# Patient Record
Sex: Female | Born: 1955
Health system: Southern US, Community
[De-identification: ages and names within clinical notes are randomized; demographics above are authoritative.]

## PROBLEM LIST (undated history)

## (undated) DIAGNOSIS — M329 Systemic lupus erythematosus, unspecified: Secondary | ICD-10-CM

## (undated) DIAGNOSIS — D649 Anemia, unspecified: Secondary | ICD-10-CM

## (undated) DIAGNOSIS — R7989 Other specified abnormal findings of blood chemistry: Secondary | ICD-10-CM

## (undated) DIAGNOSIS — I10 Essential (primary) hypertension: Secondary | ICD-10-CM

## (undated) DIAGNOSIS — R87619 Unspecified abnormal cytological findings in specimens from cervix uteri: Secondary | ICD-10-CM

## (undated) DIAGNOSIS — IMO0002 Reserved for concepts with insufficient information to code with codable children: Secondary | ICD-10-CM

## (undated) DIAGNOSIS — F419 Anxiety disorder, unspecified: Secondary | ICD-10-CM

## (undated) DIAGNOSIS — N289 Disorder of kidney and ureter, unspecified: Secondary | ICD-10-CM

## (undated) HISTORY — DX: Essential (primary) hypertension: I10

## (undated) HISTORY — DX: Unspecified abnormal cytological findings in specimens from cervix uteri: R87.619

## (undated) HISTORY — PX: CRYOTHERAPY: SHX1416

## (undated) HISTORY — DX: Systemic lupus erythematosus, unspecified: M32.9

## (undated) HISTORY — DX: Other specified abnormal findings of blood chemistry: R79.89

## (undated) HISTORY — DX: Anemia, unspecified: D64.9

## (undated) HISTORY — DX: Disorder of kidney and ureter, unspecified: N28.9

## (undated) HISTORY — DX: Reserved for concepts with insufficient information to code with codable children: IMO0002

---

## 1985-10-22 DIAGNOSIS — IMO0002 Reserved for concepts with insufficient information to code with codable children: Secondary | ICD-10-CM

## 1985-10-22 HISTORY — DX: Reserved for concepts with insufficient information to code with codable children: IMO0002

## 1986-10-22 DIAGNOSIS — N289 Disorder of kidney and ureter, unspecified: Secondary | ICD-10-CM

## 1986-10-22 HISTORY — DX: Disorder of kidney and ureter, unspecified: N28.9

## 1988-10-22 HISTORY — PX: TUBAL LIGATION: SHX77

## 1998-04-13 ENCOUNTER — Ambulatory Visit (HOSPITAL_COMMUNITY): Admission: RE | Admit: 1998-04-13 | Discharge: 1998-04-13 | Payer: Self-pay | Admitting: Obstetrics and Gynecology

## 1998-08-01 ENCOUNTER — Other Ambulatory Visit: Admission: RE | Admit: 1998-08-01 | Discharge: 1998-08-01 | Payer: Self-pay | Admitting: Obstetrics and Gynecology

## 1998-12-15 ENCOUNTER — Other Ambulatory Visit: Admission: RE | Admit: 1998-12-15 | Discharge: 1998-12-15 | Payer: Self-pay | Admitting: Obstetrics and Gynecology

## 1999-04-18 ENCOUNTER — Encounter: Payer: Self-pay | Admitting: Obstetrics and Gynecology

## 1999-04-18 ENCOUNTER — Ambulatory Visit (HOSPITAL_COMMUNITY): Admission: RE | Admit: 1999-04-18 | Discharge: 1999-04-18 | Payer: Self-pay | Admitting: Obstetrics and Gynecology

## 1999-04-26 ENCOUNTER — Ambulatory Visit (HOSPITAL_COMMUNITY): Admission: RE | Admit: 1999-04-26 | Discharge: 1999-04-26 | Payer: Self-pay | Admitting: Obstetrics and Gynecology

## 1999-04-26 ENCOUNTER — Encounter: Payer: Self-pay | Admitting: Obstetrics and Gynecology

## 1999-06-08 ENCOUNTER — Other Ambulatory Visit: Admission: RE | Admit: 1999-06-08 | Discharge: 1999-06-08 | Payer: Self-pay | Admitting: Obstetrics and Gynecology

## 1999-07-04 ENCOUNTER — Encounter (INDEPENDENT_AMBULATORY_CARE_PROVIDER_SITE_OTHER): Payer: Self-pay | Admitting: Specialist

## 1999-07-04 ENCOUNTER — Ambulatory Visit (HOSPITAL_COMMUNITY): Admission: RE | Admit: 1999-07-04 | Discharge: 1999-07-04 | Payer: Self-pay | Admitting: *Deleted

## 1999-07-23 HISTORY — PX: BREAST EXCISIONAL BIOPSY: SUR124

## 1999-07-23 HISTORY — PX: BREAST SURGERY: SHX581

## 1999-08-15 ENCOUNTER — Ambulatory Visit (HOSPITAL_COMMUNITY): Admission: RE | Admit: 1999-08-15 | Discharge: 1999-08-15 | Payer: Self-pay | Admitting: *Deleted

## 1999-08-15 ENCOUNTER — Encounter (INDEPENDENT_AMBULATORY_CARE_PROVIDER_SITE_OTHER): Payer: Self-pay | Admitting: Specialist

## 1999-11-22 ENCOUNTER — Ambulatory Visit (HOSPITAL_COMMUNITY): Admission: RE | Admit: 1999-11-22 | Discharge: 1999-11-22 | Payer: Self-pay | Admitting: Obstetrics and Gynecology

## 1999-11-22 ENCOUNTER — Encounter: Payer: Self-pay | Admitting: Obstetrics and Gynecology

## 1999-12-01 ENCOUNTER — Other Ambulatory Visit: Admission: RE | Admit: 1999-12-01 | Discharge: 1999-12-01 | Payer: Self-pay | Admitting: Obstetrics and Gynecology

## 2000-04-16 ENCOUNTER — Ambulatory Visit (HOSPITAL_COMMUNITY): Admission: RE | Admit: 2000-04-16 | Discharge: 2000-04-16 | Payer: Self-pay | Admitting: Obstetrics and Gynecology

## 2000-04-16 ENCOUNTER — Encounter: Payer: Self-pay | Admitting: Obstetrics and Gynecology

## 2000-11-22 ENCOUNTER — Other Ambulatory Visit: Admission: RE | Admit: 2000-11-22 | Discharge: 2000-11-22 | Payer: Self-pay | Admitting: Obstetrics and Gynecology

## 2001-04-29 ENCOUNTER — Ambulatory Visit (HOSPITAL_COMMUNITY): Admission: RE | Admit: 2001-04-29 | Discharge: 2001-04-29 | Payer: Self-pay | Admitting: Obstetrics and Gynecology

## 2001-04-29 ENCOUNTER — Encounter: Payer: Self-pay | Admitting: Obstetrics and Gynecology

## 2002-04-30 ENCOUNTER — Encounter: Payer: Self-pay | Admitting: Obstetrics and Gynecology

## 2002-04-30 ENCOUNTER — Ambulatory Visit (HOSPITAL_COMMUNITY): Admission: RE | Admit: 2002-04-30 | Discharge: 2002-04-30 | Payer: Self-pay | Admitting: Obstetrics and Gynecology

## 2003-05-04 ENCOUNTER — Encounter: Payer: Self-pay | Admitting: Obstetrics and Gynecology

## 2003-05-04 ENCOUNTER — Ambulatory Visit (HOSPITAL_COMMUNITY): Admission: RE | Admit: 2003-05-04 | Discharge: 2003-05-04 | Payer: Self-pay | Admitting: Obstetrics and Gynecology

## 2004-05-04 ENCOUNTER — Ambulatory Visit (HOSPITAL_COMMUNITY): Admission: RE | Admit: 2004-05-04 | Discharge: 2004-05-04 | Payer: Self-pay | Admitting: Obstetrics and Gynecology

## 2005-01-04 ENCOUNTER — Other Ambulatory Visit: Admission: RE | Admit: 2005-01-04 | Discharge: 2005-01-04 | Payer: Self-pay | Admitting: Obstetrics and Gynecology

## 2005-05-08 ENCOUNTER — Ambulatory Visit (HOSPITAL_COMMUNITY): Admission: RE | Admit: 2005-05-08 | Discharge: 2005-05-08 | Payer: Self-pay | Admitting: Obstetrics and Gynecology

## 2005-07-30 ENCOUNTER — Ambulatory Visit: Payer: Self-pay | Admitting: General Surgery

## 2005-10-22 HISTORY — PX: NASAL SEPTUM SURGERY: SHX37

## 2006-01-10 ENCOUNTER — Other Ambulatory Visit: Admission: RE | Admit: 2006-01-10 | Discharge: 2006-01-10 | Payer: Self-pay | Admitting: Obstetrics & Gynecology

## 2006-05-09 ENCOUNTER — Ambulatory Visit (HOSPITAL_COMMUNITY): Admission: RE | Admit: 2006-05-09 | Discharge: 2006-05-09 | Payer: Self-pay | Admitting: Obstetrics and Gynecology

## 2006-10-14 ENCOUNTER — Other Ambulatory Visit: Payer: Self-pay

## 2006-10-21 ENCOUNTER — Ambulatory Visit: Payer: Self-pay | Admitting: Unknown Physician Specialty

## 2006-12-12 ENCOUNTER — Emergency Department: Payer: Self-pay

## 2007-01-13 ENCOUNTER — Other Ambulatory Visit: Admission: RE | Admit: 2007-01-13 | Discharge: 2007-01-13 | Payer: Self-pay | Admitting: Obstetrics & Gynecology

## 2007-02-18 DIAGNOSIS — R809 Proteinuria, unspecified: Secondary | ICD-10-CM | POA: Insufficient documentation

## 2007-05-13 ENCOUNTER — Ambulatory Visit (HOSPITAL_COMMUNITY): Admission: RE | Admit: 2007-05-13 | Discharge: 2007-05-13 | Payer: Self-pay | Admitting: Obstetrics & Gynecology

## 2007-10-04 ENCOUNTER — Ambulatory Visit: Payer: Self-pay | Admitting: Rheumatology

## 2007-10-18 ENCOUNTER — Emergency Department: Payer: Self-pay | Admitting: Emergency Medicine

## 2007-10-20 ENCOUNTER — Emergency Department: Payer: Self-pay | Admitting: Emergency Medicine

## 2007-10-27 ENCOUNTER — Emergency Department: Payer: Self-pay | Admitting: Emergency Medicine

## 2007-10-29 ENCOUNTER — Emergency Department: Payer: Self-pay | Admitting: Emergency Medicine

## 2008-01-27 ENCOUNTER — Other Ambulatory Visit: Admission: RE | Admit: 2008-01-27 | Discharge: 2008-01-27 | Payer: Self-pay | Admitting: Obstetrics and Gynecology

## 2008-05-13 ENCOUNTER — Ambulatory Visit (HOSPITAL_COMMUNITY): Admission: RE | Admit: 2008-05-13 | Discharge: 2008-05-13 | Payer: Self-pay | Admitting: Obstetrics & Gynecology

## 2008-05-21 ENCOUNTER — Encounter: Admission: RE | Admit: 2008-05-21 | Discharge: 2008-05-21 | Payer: Self-pay | Admitting: Obstetrics & Gynecology

## 2009-02-24 ENCOUNTER — Other Ambulatory Visit: Admission: RE | Admit: 2009-02-24 | Discharge: 2009-02-24 | Payer: Self-pay | Admitting: Obstetrics and Gynecology

## 2009-05-25 ENCOUNTER — Ambulatory Visit (HOSPITAL_COMMUNITY): Admission: RE | Admit: 2009-05-25 | Discharge: 2009-05-25 | Payer: Self-pay | Admitting: Internal Medicine

## 2010-05-26 ENCOUNTER — Ambulatory Visit (HOSPITAL_COMMUNITY): Admission: RE | Admit: 2010-05-26 | Discharge: 2010-05-26 | Payer: Self-pay | Admitting: Obstetrics and Gynecology

## 2010-10-22 HISTORY — PX: COLONOSCOPY: SHX174

## 2010-11-10 ENCOUNTER — Ambulatory Visit: Payer: Self-pay | Admitting: General Surgery

## 2011-06-11 ENCOUNTER — Other Ambulatory Visit: Payer: Self-pay | Admitting: Obstetrics and Gynecology

## 2011-06-11 DIAGNOSIS — Z1231 Encounter for screening mammogram for malignant neoplasm of breast: Secondary | ICD-10-CM

## 2011-06-15 ENCOUNTER — Ambulatory Visit (HOSPITAL_COMMUNITY)
Admission: RE | Admit: 2011-06-15 | Discharge: 2011-06-15 | Disposition: A | Discharge status: Home / Self Care | Payer: PRIVATE HEALTH INSURANCE | Source: Ambulatory Visit | Attending: Obstetrics and Gynecology | Admitting: Obstetrics and Gynecology

## 2011-06-15 DIAGNOSIS — Z1231 Encounter for screening mammogram for malignant neoplasm of breast: Secondary | ICD-10-CM | POA: Insufficient documentation

## 2011-08-29 ENCOUNTER — Ambulatory Visit: Payer: Self-pay | Admitting: Internal Medicine

## 2011-09-22 ENCOUNTER — Ambulatory Visit: Payer: Self-pay | Admitting: Internal Medicine

## 2012-05-19 ENCOUNTER — Other Ambulatory Visit: Payer: Self-pay | Admitting: Nurse Practitioner

## 2012-05-19 DIAGNOSIS — Z1231 Encounter for screening mammogram for malignant neoplasm of breast: Secondary | ICD-10-CM

## 2012-06-16 ENCOUNTER — Ambulatory Visit (HOSPITAL_COMMUNITY)
Admission: RE | Admit: 2012-06-16 | Discharge: 2012-06-16 | Disposition: A | Discharge status: Home / Self Care | Payer: No Typology Code available for payment source | Source: Ambulatory Visit | Attending: Nurse Practitioner | Admitting: Nurse Practitioner

## 2012-06-16 DIAGNOSIS — Z1231 Encounter for screening mammogram for malignant neoplasm of breast: Secondary | ICD-10-CM | POA: Insufficient documentation

## 2012-06-19 ENCOUNTER — Other Ambulatory Visit: Payer: Self-pay | Admitting: Nurse Practitioner

## 2012-06-19 DIAGNOSIS — R928 Other abnormal and inconclusive findings on diagnostic imaging of breast: Secondary | ICD-10-CM

## 2012-06-24 ENCOUNTER — Ambulatory Visit
Admission: RE | Admit: 2012-06-24 | Discharge: 2012-06-24 | Disposition: A | Discharge status: Home / Self Care | Payer: PRIVATE HEALTH INSURANCE | Source: Ambulatory Visit | Attending: Nurse Practitioner | Admitting: Nurse Practitioner

## 2012-06-24 DIAGNOSIS — R928 Other abnormal and inconclusive findings on diagnostic imaging of breast: Secondary | ICD-10-CM

## 2012-09-10 ENCOUNTER — Ambulatory Visit: Payer: Self-pay | Admitting: Internal Medicine

## 2012-09-21 ENCOUNTER — Ambulatory Visit: Payer: Self-pay | Admitting: Internal Medicine

## 2013-02-03 DIAGNOSIS — I10 Essential (primary) hypertension: Secondary | ICD-10-CM | POA: Insufficient documentation

## 2013-02-04 DIAGNOSIS — N1831 Chronic kidney disease, stage 3a: Secondary | ICD-10-CM | POA: Insufficient documentation

## 2013-02-04 DIAGNOSIS — N183 Chronic kidney disease, stage 3 unspecified: Secondary | ICD-10-CM | POA: Insufficient documentation

## 2013-03-11 ENCOUNTER — Encounter: Payer: Self-pay | Admitting: *Deleted

## 2013-03-12 ENCOUNTER — Encounter: Payer: Self-pay | Admitting: Nurse Practitioner

## 2013-03-12 ENCOUNTER — Ambulatory Visit (INDEPENDENT_AMBULATORY_CARE_PROVIDER_SITE_OTHER): Payer: PRIVATE HEALTH INSURANCE | Admitting: Nurse Practitioner

## 2013-03-12 VITALS — BP 120/66 | HR 72 | Ht 66.0 in | Wt 192.4 lb

## 2013-03-12 DIAGNOSIS — N951 Menopausal and female climacteric states: Secondary | ICD-10-CM

## 2013-03-12 DIAGNOSIS — Z01419 Encounter for gynecological examination (general) (routine) without abnormal findings: Secondary | ICD-10-CM

## 2013-03-12 DIAGNOSIS — Z0000 Encounter for general adult medical examination without abnormal findings: Secondary | ICD-10-CM

## 2013-03-12 LAB — POCT URINALYSIS DIPSTICK
Spec Grav, UA: 1.02
Urobilinogen, UA: NEGATIVE

## 2013-03-12 MED ORDER — CONJ ESTROG-MEDROXYPROGEST ACE 0.3-1.5 MG PO TABS: 1.0000 | ORAL_TABLET | Freq: Every day | ORAL | Status: DC

## 2013-03-12 NOTE — Progress Notes (Signed)
57 y.o.G1, P1 . Married Caucasian Fe here for annual exam.  Ran out of Prempro and not refilled for this past month. Some increased in vaso symptoms. Going to [facility] to see grandson next week. Very excited.  No LMP recorded. Patient is postmenopausal.          Sexually active: yes  The current method of family planning is tubal ligation.    Exercising: yes  walk Smoker:  no  Health Maintenance: Pap:  03/07/2012  Normal  MMG:  04/2012 with diagnostic imaging and Ultrasound which was negative Colonoscopy:  10/2010 diverticulitis recheck in 5 years BMD:   03/2010 normal TDaP:  Within ten years, per pt.  Labs: hgb- 11.1   reports that she has never smoked. She has never used smokeless tobacco. She reports that she drinks about 1.2 ounces of alcohol per week. She reports that she does not use illicit drugs.  Past Medical History  Diagnosis Date  . Anemia   . History of lupus 1987  . Hypertension   . Renal insufficiency 1988    kidney biopsy 1988 and 1998  . Abnormal pap     Past Surgical History  Procedure Laterality Date  . Breast surgery  10/00    right breast benign fibroadenoma    Current Outpatient Prescriptions  Medication Sig Dispense Refill  . aspirin 81 MG tablet Take 81 mg by mouth daily.      . citalopram (CELEXA) 20 MG tablet Take 20 mg by mouth daily.      . diazepam (VALIUM) 5 MG tablet Take 5 mg by mouth every 6 (six) hours as needed for anxiety.      . enalapril (VASOTEC) 10 MG tablet Take 10 mg by mouth daily.      Marland Kitchen estrogen, conjugated,-medroxyprogesterone (PREMPRO) 0.3-1.5 MG per tablet Take 1 tablet by mouth daily.  90 tablet  3  . hydrochlorothiazide (HYDRODIURIL) 25 MG tablet Take 25 mg by mouth daily.      . hydroxychloroquine (PLAQUENIL) 200 MG tablet Take by mouth daily.      . traMADol (ULTRAM) 50 MG tablet Take 50 mg by mouth every 6 (six) hours as needed for pain.      . ergocalciferol (VITAMIN D2) 50000 UNITS capsule Take 50,000 Units by mouth once a  week.      . Multiple Vitamin (MULTIVITAMIN) capsule Take 1 capsule by mouth daily.       No current facility-administered medications for this visit.    Family History  Problem Relation Age of Onset  . Cancer Father     oral cancer  . Diabetes Paternal Aunt   . Cancer Maternal Grandmother     ovarian cancer  . Hypertension Maternal Grandmother   . Diabetes Maternal Grandfather     ROS:  Pertinent items are noted in HPI.  Otherwise, a comprehensive ROS was negative.  Exam:   BP 120/66  Pulse 72  Ht 5\' 6"  (1.676 m)  Wt 192 lb 6.4 oz (87.272 kg)  BMI 31.07 kg/m2 Height: 5\' 6"  (167.6 cm)  Ht Readings from Last 3 Encounters:  03/12/13 5\' 6"  (1.676 m)    General appearance: alert, cooperative and appears stated age Head: Normocephalic, without obvious abnormality, atraumatic Neck: no adenopathy, supple, symmetrical, trachea midline and thyroid normal to inspection and palpation Lungs: clear to auscultation bilaterally Breasts: normal appearance, no masses or tenderness, no mass felt in right breast.  Always has FCB changes. Heart: regular rate and rhythm Abdomen: soft, non-tender;  no masses,  no organomegaly Extremities: extremities normal, atraumatic, no cyanosis or edema Skin: Skin color, texture, turgor normal. No rashes or lesions Lymph nodes: Cervical, supraclavicular, and axillary nodes normal. No abnormal inguinal nodes palpated Neurologic: Grossly normal   Pelvic: External genitalia:  no lesions              Urethra:  normal appearing urethra with no masses, tenderness or lesions              Bartholin's and Skene's: normal                 Vagina: normal appearing vagina with normal color and discharge, no lesions              Cervix: anteverted              Pap taken: no Bimanual Exam:  Uterus:  normal size, contour, position, consistency, mobility, non-tender              Adnexa: no mass, fullness, tenderness               Rectovaginal: Confirms                Anus:  normal sphincter tone, no lesions  A:  Well Woman with normal exam  Postmenopausal on HRT and wants to continue but OK to reduce dosage  History of FCB changes  Recent abnormal mammo for diagnostic on 5/29  (usually gets a recall yearly)  P:   Pap smear as per guidelines   Mammogram already scheduled for repeat test on 03/19/13  RX Prempro 0.3/1.5 daily, but patietn is aware if any abnormal findings to stop meds,   She will wait and restart after 5/29 outcome.  Discussed rationale for continued use or discontinuation of HRT with potential side effects and risk  including DVT, CVA, cancer , etc. return annually or prn  An After Visit Summary was printed and given to the patient.

## 2013-03-12 NOTE — Patient Instructions (Signed)

## 2013-03-13 NOTE — Progress Notes (Signed)
Encounter reviewed by Dr. Ad Guttman Silva.  

## 2013-04-13 ENCOUNTER — Telehealth: Payer: Self-pay | Admitting: Nurse Practitioner

## 2013-04-13 NOTE — Telephone Encounter (Signed)
Patient wants lab results for Vit.D

## 2013-04-13 NOTE — Telephone Encounter (Signed)
LMTCB  aa    (Need to tell her that Vit D level was 28.1. Pt can take OTC Vit D supplement every day.)

## 2013-04-17 NOTE — Telephone Encounter (Signed)
Spoke with pt about Vit D level. Pt is taking a MVI and a calcium and Vit D supplement daily. Advised that would be enough to bring level up. Pt agreeable.

## 2013-05-12 ENCOUNTER — Other Ambulatory Visit: Payer: Self-pay | Admitting: Nurse Practitioner

## 2013-05-12 ENCOUNTER — Other Ambulatory Visit: Payer: Self-pay

## 2013-05-12 DIAGNOSIS — Z1231 Encounter for screening mammogram for malignant neoplasm of breast: Secondary | ICD-10-CM

## 2013-06-17 ENCOUNTER — Ambulatory Visit (HOSPITAL_COMMUNITY)
Admission: RE | Admit: 2013-06-17 | Discharge: 2013-06-17 | Disposition: A | Discharge status: Home / Self Care | Payer: No Typology Code available for payment source | Source: Ambulatory Visit | Attending: Nurse Practitioner | Admitting: Nurse Practitioner

## 2013-06-17 DIAGNOSIS — Z1231 Encounter for screening mammogram for malignant neoplasm of breast: Secondary | ICD-10-CM

## 2013-09-09 ENCOUNTER — Ambulatory Visit: Payer: Self-pay | Admitting: Internal Medicine

## 2013-09-21 ENCOUNTER — Ambulatory Visit: Payer: Self-pay | Admitting: Internal Medicine

## 2013-10-22 ENCOUNTER — Ambulatory Visit: Payer: Self-pay | Admitting: Internal Medicine

## 2013-11-25 DIAGNOSIS — D649 Anemia, unspecified: Secondary | ICD-10-CM | POA: Insufficient documentation

## 2014-02-17 ENCOUNTER — Other Ambulatory Visit: Payer: Self-pay

## 2014-02-17 DIAGNOSIS — N951 Menopausal and female climacteric states: Secondary | ICD-10-CM

## 2014-02-17 MED ORDER — CONJ ESTROG-MEDROXYPROGEST ACE 0.3-1.5 MG PO TABS: 1.0000 | ORAL_TABLET | Freq: Every day | ORAL | Status: DC

## 2014-02-17 NOTE — Telephone Encounter (Signed)
Pt has aex 03-16-14. Pt had normal mammo 7/14. Pharmacy requesting 90 day supply. rx sent for 90 days with 0 refills

## 2014-03-16 ENCOUNTER — Encounter: Payer: Self-pay | Admitting: Nurse Practitioner

## 2014-03-16 ENCOUNTER — Ambulatory Visit (INDEPENDENT_AMBULATORY_CARE_PROVIDER_SITE_OTHER): Payer: PRIVATE HEALTH INSURANCE | Admitting: Nurse Practitioner

## 2014-03-16 VITALS — BP 132/64 | HR 64 | Ht 65.5 in | Wt 162.0 lb

## 2014-03-16 DIAGNOSIS — Z01419 Encounter for gynecological examination (general) (routine) without abnormal findings: Secondary | ICD-10-CM

## 2014-03-16 DIAGNOSIS — R82998 Other abnormal findings in urine: Secondary | ICD-10-CM

## 2014-03-16 DIAGNOSIS — Z0000 Encounter for general adult medical examination without abnormal findings: Secondary | ICD-10-CM

## 2014-03-16 DIAGNOSIS — R8290 Unspecified abnormal findings in urine: Secondary | ICD-10-CM

## 2014-03-16 DIAGNOSIS — N951 Menopausal and female climacteric states: Secondary | ICD-10-CM

## 2014-03-16 LAB — POCT URINALYSIS DIPSTICK
BILIRUBIN UA: NEGATIVE
Glucose, UA: NEGATIVE
KETONES UA: NEGATIVE
NITRITE UA: NEGATIVE
PH UA: 5
Protein, UA: NEGATIVE
RBC UA: NEGATIVE
Urobilinogen, UA: NEGATIVE

## 2014-03-16 LAB — HEMOGLOBIN, FINGERSTICK: Hemoglobin, fingerstick: 11.5 g/dL — ABNORMAL LOW (ref 12.0–16.0)

## 2014-03-16 MED ORDER — CONJ ESTROG-MEDROXYPROGEST ACE 0.3-1.5 MG PO TABS
1.0000 | ORAL_TABLET | Freq: Every day | ORAL | Status: DC
Start: 2014-03-16 — End: 2015-03-22

## 2014-03-16 NOTE — Patient Instructions (Signed)

## 2014-03-16 NOTE — Progress Notes (Signed)
Patient ID: [name], female   DOB: Mar 26, 1956, 58 y.o.   MRN: [id number] 58 y.o. G1P1 Married Caucasian Fe here for annual exam. She is doing well on HRT and wants to continue.  In September had left leg pain after stepping off a step stool earlier in the year.  Will call the rhuematoligist as she is still having pain in that area - may need MRI.   Husband now diagnosed with lung cancer stage III b size 4 cm. Now on chemo and radiation. No UTI symptoms.  Patient's last menstrual period was 08/22/2008.          Sexually active: yes  The current method of family planning is tubal ligation.    Exercising: yes  walking Smoker:  no  Health Maintenance: Pap: 03/07/2012 Normal (history of CIN I 1978) MMG: 06/17/13, Bi-Rads 1: negative   Colonoscopy: 10/2010 diverticulitis recheck in 5 years  BMD: 03/2010 normal  TDaP: Within ten years, per pt.  Labs:  HB:  11.5  Urine:  Trace leuks   reports that she has never smoked. She has never used smokeless tobacco. She reports that she drinks about 1.2 ounces of alcohol per week. She reports that she does not use illicit drugs.  Past Medical History  Diagnosis Date  . Anemia   . History of lupus 1987  . Hypertension   . Renal insufficiency 1988    kidney biopsy 1988 and 1998  . Abnormal pap     Past Surgical History  Procedure Laterality Date  . Breast surgery  10/00    right breast benign fibroadenoma    Current Outpatient Prescriptions  Medication Sig Dispense Refill  . aspirin 81 MG tablet Take 81 mg by mouth daily.      . Calcium Carb-Cholecalciferol (CALCIUM 600 + D PO) Take 1 tablet by mouth at bedtime.      . diazepam (VALIUM) 5 MG tablet Take 5 mg by mouth every 6 (six) hours as needed for anxiety.      . enalapril (VASOTEC) 10 MG tablet Take 10 mg by mouth daily.      Marland Kitchen estrogen, conjugated,-medroxyprogesterone (PREMPRO) 0.3-1.5 MG per tablet Take 1 tablet by mouth daily.  90 tablet  3  . hydroxychloroquine (PLAQUENIL) 200 MG  tablet Take by mouth daily.      . Multiple Vitamin (MULTIVITAMIN) capsule Take 1 capsule by mouth daily.      . traMADol (ULTRAM) 50 MG tablet Take 50 mg by mouth every 6 (six) hours as needed for pain.       No current facility-administered medications for this visit.    Family History  Problem Relation Age of Onset  . Cancer Father     oral cancer  . Diabetes Paternal Aunt   . Cancer Maternal Grandmother     ovarian cancer  . Hypertension Maternal Grandmother   . Diabetes Maternal Grandfather     ROS:  Pertinent items are noted in HPI.  Otherwise, a comprehensive ROS was negative.  Exam:   BP 132/64  Pulse 64  Ht 5' 5.5" (1.664 m)  Wt 162 lb (73.483 kg)  BMI 26.54 kg/m2  LMP 08/22/2008 Height: 5' 5.5" (166.4 cm)  Ht Readings from Last 3 Encounters:  03/16/14 5' 5.5" (1.664 m)  03/12/13 5\' 6"  (1.676 m)    General appearance: alert, cooperative and appears stated age Head: Normocephalic, without obvious abnormality, atraumatic Neck: no adenopathy, supple, symmetrical, trachea midline and thyroid normal to inspection and palpation  Lungs: clear to auscultation bilaterally Breasts: normal appearance, no masses or tenderness Heart: regular rate and rhythm Abdomen: soft, non-tender; no masses,  no organomegaly Extremities: extremities normal, atraumatic, no cyanosis or edema Skin: Skin color, texture, turgor normal. No rashes or lesions Lymph nodes: Cervical, supraclavicular, and axillary nodes normal. No abnormal inguinal nodes palpated Neurologic: Grossly normal   Pelvic: External genitalia:  no lesions              Urethra:  normal appearing urethra with no masses, tenderness or lesions              Bartholin's and Skene's: normal                 Vagina: normal appearing vagina with normal color and discharge, no lesions              Cervix: anteverted              Pap taken: yes Bimanual Exam:  Uterus:  normal size, contour, position, consistency, mobility,  non-tender              Adnexa: no mass, fullness, tenderness               Rectovaginal: Confirms               Anus:  normal sphincter tone, no lesions  A:  Well Woman with normal exam  Postmenopausal on HRT since 01/1998  Remote CIN I 1998  Situational stressors  Weight loss - some intentional and some with stress  Some insomnia - will try Benadryl prn  R/O UTI  P:   Reviewed health and wellness pertinent to exam  Pap smear taken today  Follow with urine culture  Mammogram is due 05/2014  Refill on HRT for a year  Counseled with potential risk of CVA, DVT, cancer, etc.  Counseled on breast self exam, mammography screening, adequate intake of calcium and vitamin D, diet and exercise, Kegel's exercises return annually or prn  An After Visit Summary was printed and given to the patient.

## 2014-03-17 LAB — URINE CULTURE
Colony Count: NO GROWTH
ORGANISM ID, BACTERIA: NO GROWTH

## 2014-03-17 LAB — IPS PAP TEST WITH REFLEX TO HPV

## 2014-03-19 ENCOUNTER — Telehealth: Payer: Self-pay | Admitting: Nurse Practitioner

## 2014-03-19 NOTE — Telephone Encounter (Signed)
Patient states she is returning a call to stephanie but i dont see anything.

## 2014-03-19 NOTE — Telephone Encounter (Signed)
Spoke with patient. Advised of results as seen below. Patient states that when she was seen by Milford Cage, FNP she was told that she had some "white blood cells" in her urine and was asked if she was having any urinary frequency, burning, or itching all of which patient denies. Patient states " It was worrying me a little and now that it says it is normal I am just a little confused. Patty told me that it could indicate an infection and I just want to make sure." Advised would send a message over to the covering provider to take another look at results and give patient a call back with further recommendations. Patient agreeable.  Dr.Silva, patient is concerned about a possible infection due to previous discussion at aex. Patient asking for clarification of results. Please advise. Thank you.

## 2014-03-19 NOTE — Progress Notes (Signed)
Encounter reviewed by Dr. Brook Silva.  

## 2014-03-19 NOTE — Telephone Encounter (Signed)
Spoke with patient. Advised of message from [place]. Patient agreeable and verbalizes understanding stating " I was just worried when she told me that and then asked me all the questions. I am not having any symptoms." Advised patient that there is no infection and with no symptoms no further evaluation is needed. Advised patient to call if any symptoms do occur. Patient agreeable.  Routing to Dr.Silva as covering CC: Milford Cage, FNP   Routing to provider for final review. Patient agreeable to disposition. Will close encounter

## 2014-03-19 NOTE — Telephone Encounter (Signed)
Result Notes    Notes Recorded by Graylon Good, CMA on 03/19/2014 at 9:57 AM Pt notified of results via [phone] [facility]) per [facility]. Pt advised in message to call with any questions. ------  Notes Recorded by Jamey Reas de Berton Lan, MD on 03/18/2014 at 5:34 PM Please inform patient of negative urine culture and normal pap.  Please enter pap recall - 02.

## 2014-03-19 NOTE — Telephone Encounter (Signed)
Her urine dip showed trace white blood cells. Patty ordered a urine culture to look for infection, but there was none. No bacteria grew in the urine, so this means that there was no infection. Sometimes white blood cells appear on the urine if it was difficult to clean off prior to giving a urine sample. No further evaluation is needed unless the patient has symptoms of infection.

## 2014-05-17 ENCOUNTER — Other Ambulatory Visit: Payer: Self-pay | Admitting: Nurse Practitioner

## 2014-05-17 DIAGNOSIS — Z1231 Encounter for screening mammogram for malignant neoplasm of breast: Secondary | ICD-10-CM

## 2014-06-18 ENCOUNTER — Ambulatory Visit (HOSPITAL_COMMUNITY): Payer: No Typology Code available for payment source

## 2014-06-21 ENCOUNTER — Other Ambulatory Visit: Payer: Self-pay | Admitting: Nurse Practitioner

## 2014-06-21 ENCOUNTER — Ambulatory Visit (HOSPITAL_COMMUNITY)
Admission: RE | Admit: 2014-06-21 | Discharge: 2014-06-21 | Disposition: A | Discharge status: Home / Self Care | Payer: No Typology Code available for payment source | Source: Ambulatory Visit | Attending: Nurse Practitioner | Admitting: Nurse Practitioner

## 2014-06-21 DIAGNOSIS — Z1231 Encounter for screening mammogram for malignant neoplasm of breast: Secondary | ICD-10-CM | POA: Insufficient documentation

## 2014-08-06 ENCOUNTER — Ambulatory Visit: Payer: Self-pay | Admitting: Internal Medicine

## 2014-08-22 ENCOUNTER — Ambulatory Visit: Payer: Self-pay | Admitting: Internal Medicine

## 2014-08-23 ENCOUNTER — Encounter: Payer: Self-pay | Admitting: Nurse Practitioner

## 2015-01-20 ENCOUNTER — Telehealth: Payer: Self-pay | Admitting: Nurse Practitioner

## 2015-01-20 NOTE — Telephone Encounter (Signed)
Spoke with patient. Patient states she has recently lost weight and has noticed a "bubble" under the skin in her right lower quadrant. "I feel it when I am pressing on the area and it feels like a heavy discomfort in that area. A couple weeks before now I noticed a pulling sensation in that area also." Denies any bleeding or current abdominal discomfort. Has not noticed any bloating of abdomen. Patient requesting an appointment with Milford Cage, FNP for next Tuesday 4/5. Appointment schedule for 4/5 at 11:15am. Patient is agreeable to date and time. Patient will monitor symptoms. If they increase will call to be seen sooner or seek immediate care.  Routing to provider for final review. Patient agreeable to disposition. Will close encounter

## 2015-01-20 NOTE — Telephone Encounter (Signed)
Patient called stating, "I am having pain and heaviness in my lower left abdomin near my ovary." She is requesting an appointment.

## 2015-01-25 ENCOUNTER — Ambulatory Visit (INDEPENDENT_AMBULATORY_CARE_PROVIDER_SITE_OTHER): Payer: PRIVATE HEALTH INSURANCE | Admitting: Nurse Practitioner

## 2015-01-25 ENCOUNTER — Encounter: Payer: Self-pay | Admitting: Nurse Practitioner

## 2015-01-25 VITALS — BP 130/76 | HR 80 | Ht 65.5 in | Wt 159.0 lb

## 2015-01-25 DIAGNOSIS — R1031 Right lower quadrant pain: Secondary | ICD-10-CM | POA: Diagnosis not present

## 2015-01-25 LAB — POCT URINALYSIS DIPSTICK
BILIRUBIN UA: NEGATIVE
Blood, UA: NEGATIVE
GLUCOSE UA: NEGATIVE
KETONES UA: NEGATIVE
LEUKOCYTES UA: NEGATIVE
Nitrite, UA: NEGATIVE
PROTEIN UA: NEGATIVE
Urobilinogen, UA: NEGATIVE
pH, UA: 5

## 2015-01-25 LAB — CBC WITH DIFFERENTIAL/PLATELET
Basophils Absolute: 0 10*3/uL (ref 0.0–0.1)
Basophils Relative: 0 % (ref 0–1)
Eosinophils Absolute: 0.1 10*3/uL (ref 0.0–0.7)
Eosinophils Relative: 1 % (ref 0–5)
HEMATOCRIT: 41.8 % (ref 36.0–46.0)
HEMOGLOBIN: 13.3 g/dL (ref 12.0–15.0)
LYMPHS ABS: 2 10*3/uL (ref 0.7–4.0)
LYMPHS PCT: 33 % (ref 12–46)
MCH: 26.8 pg (ref 26.0–34.0)
MCHC: 31.8 g/dL (ref 30.0–36.0)
MCV: 84.3 fL (ref 78.0–100.0)
MPV: 11.4 fL (ref 8.6–12.4)
Monocytes Absolute: 0.5 10*3/uL (ref 0.1–1.0)
Monocytes Relative: 8 % (ref 3–12)
NEUTROS PCT: 58 % (ref 43–77)
Neutro Abs: 3.6 10*3/uL (ref 1.7–7.7)
Platelets: 281 10*3/uL (ref 150–400)
RBC: 4.96 MIL/uL (ref 3.87–5.11)
RDW: 13.9 % (ref 11.5–15.5)
WBC: 6.2 10*3/uL (ref 4.0–10.5)

## 2015-01-25 NOTE — Patient Instructions (Signed)

## 2015-01-25 NOTE — Progress Notes (Signed)
Subjective:     Patient ID: [name], female   DOB: January 25, 1956, 59 y.o.   MRN: [id number]  HPI  This 59 yo MW Fe G1P1 presents with a problem visit with "right lower quadrant pressure".   She noted a generalized pain right mid to upper outer quadrant in January.  Then noted an area of 'bubble' that was slight uncomfortable recently before getting out of bed about a month ago.   No associated GI symptoms of constipation, diarrhea, N/V, fever/ chills., some increase in GERD depending on type of food.  She has associated pulling in that area but nothing that was aggravated with walking sitting or standing.  Denies bloating or increase in weight lower abdomen.  Her concerns are history of MGM with history of OV cancer, Pat.cousin with colon cancer.  Remote past history of endometriosis before her pregnancy.  No problems since then.  She is now postmenopausal since 2009.  No vaginal bleeding or other pelvic symptoms.  No change in bowel functions. Colonoscopy last done 11/10/2010 showing diverticulitis.  Last Mammo 06/21/2014.   Denies urinary symptoms.  Last SA X 4 weeks ago without pain.   Wt 162 - 03/16/14, currently at 159 today.   Review of Systems  Constitutional: Negative for fever, chills, diaphoresis, appetite change, fatigue and unexpected weight change.  Gastrointestinal: Positive for abdominal pain. Negative for nausea, vomiting, diarrhea, constipation, blood in stool, abdominal distention and anal bleeding.  Genitourinary: Positive for pelvic pain. Negative for dysuria, urgency, frequency, hematuria, flank pain, decreased urine volume, vaginal bleeding, vaginal discharge, enuresis, difficulty urinating, genital sores, vaginal pain, menstrual problem and dyspareunia.  Musculoskeletal: Positive for myalgias and neck pain.       Usual pain with arthritis in right hip.  Skin: Negative.   Neurological: Negative.   Psychiatric/Behavioral: Negative.        Objective:   Physical Exam   Constitutional: She is oriented to person, place, and time. She appears well-developed and well-nourished. No distress.  Cardiovascular: Normal rate.   Pulmonary/Chest: Effort normal.  Abdominal: Soft. Bowel sounds are normal. She exhibits no distension and no mass. There is no tenderness. There is no rebound and no guarding.  No sense of a 'bubble' feel to anything in the abdomen.  Genitourinary:  No pelvic mass and unable to initiate same pain.  No vaginal discharge.  Rectal is negative.  No bubble feeling to anything in the pelvis.  Neurological: She is alert and oriented to person, place, and time.  Psychiatric: She has a normal mood and affect. Her behavior is normal. Judgment and thought content normal.   Urine chemstrip is negative    Assessment:     Right lower abdominal or pelvic pain with 'Bubble" Menopausal on HRT Recent elevated WBC 09/22/14 at [facility] at 15.4    Plan:     Will get CBC and follow Will get PUS and follow

## 2015-01-26 ENCOUNTER — Telehealth: Payer: Self-pay | Admitting: Nurse Practitioner

## 2015-01-26 NOTE — Telephone Encounter (Signed)
Call to patient. Advised of benefit quote received for PUS. Patient declines scheduling at this time. Will review things financially and will call back when she is able.

## 2015-01-30 NOTE — Progress Notes (Signed)
Encounter reviewed by Dr. Brook Silva.  

## 2015-03-22 ENCOUNTER — Ambulatory Visit (INDEPENDENT_AMBULATORY_CARE_PROVIDER_SITE_OTHER): Payer: PRIVATE HEALTH INSURANCE | Admitting: Nurse Practitioner

## 2015-03-22 ENCOUNTER — Encounter: Payer: Self-pay | Admitting: Nurse Practitioner

## 2015-03-22 VITALS — BP 120/60 | HR 60 | Ht 65.5 in | Wt 164.0 lb

## 2015-03-22 DIAGNOSIS — Z01419 Encounter for gynecological examination (general) (routine) without abnormal findings: Secondary | ICD-10-CM

## 2015-03-22 DIAGNOSIS — N951 Menopausal and female climacteric states: Secondary | ICD-10-CM | POA: Diagnosis not present

## 2015-03-22 DIAGNOSIS — Z0000 Encounter for general adult medical examination without abnormal findings: Secondary | ICD-10-CM

## 2015-03-22 DIAGNOSIS — R1031 Right lower quadrant pain: Secondary | ICD-10-CM

## 2015-03-22 LAB — POCT URINALYSIS DIPSTICK
Bilirubin, UA: NEGATIVE
Blood, UA: NEGATIVE
GLUCOSE UA: NEGATIVE
KETONES UA: NEGATIVE
Leukocytes, UA: NEGATIVE
Nitrite, UA: NEGATIVE
Protein, UA: NEGATIVE
Urobilinogen, UA: NEGATIVE
pH, UA: 6

## 2015-03-22 MED ORDER — CONJ ESTROG-MEDROXYPROGEST ACE 0.3-1.5 MG PO TABS: 1.0000 | ORAL_TABLET | Freq: Every day | ORAL | Status: DC

## 2015-03-22 NOTE — Progress Notes (Signed)
Encounter reviewed by Dr. Josefa Half. Patient received Rx for PremPro.

## 2015-03-22 NOTE — Patient Instructions (Signed)

## 2015-03-22 NOTE — Progress Notes (Signed)
Patient ID: [name], female   DOB: 1956/08/19, 59 y.o.   MRN: [id number] 59 y.o. G1P1 Married  Caucasian Fe here for annual exam.  Since last here in April, she has had 2 other episodes of mid lower abdominal pain.  She then took a laxative and had extremely good results and seemed to relief the abdominal pressure and pain. Thinks it may be related to diverticular disease.  Off HRT since early April and having a lot of vaso symptoms - just ran out of med's.  Some insomnia.  No increase in vaginal dryness.  She would like to continue HRT.  Patient's last menstrual period was 08/22/2008.          Sexually active: Yes.    The current method of family planning is tubal ligation and post menopausal.    Exercising: Yes.    walking Smoker:  no  Health Maintenance: Pap:  03/16/14, negative  (history of CIN I 1978) MMG:  06/21/14, Bi-Rads 1:  Negative Colonoscopy:  10/2010, divertic, repeat in 5 years BMD:   03/2010, normal TDaP:  09/2007 Labs:  HB:  13.3 on 01/25/15 in EPIC  Urine:  Negative    reports that she has never smoked. She has never used smokeless tobacco. She reports that she drinks about 1.2 oz of alcohol per week. She reports that she does not use illicit drugs.  Past Medical History  Diagnosis Date  . Anemia   . History of lupus 1987  . Hypertension   . Renal insufficiency 1988    kidney biopsy 1988 and 1998  . Abnormal pap     Past Surgical History  Procedure Laterality Date  . Breast surgery  10/00    right breast benign fibroadenoma    Current Outpatient Prescriptions  Medication Sig Dispense Refill  . aspirin 81 MG tablet Take 81 mg by mouth daily.    . Calcium Carbonate-Vitamin D 600-400 MG-UNIT per tablet Take 1 tablet by mouth daily.    . diazepam (VALIUM) 5 MG tablet Take 5 mg by mouth every 6 (six) hours as needed for anxiety.    . enalapril (VASOTEC) 10 MG tablet Take 10 mg by mouth daily.    Marland Kitchen estrogen, conjugated,-medroxyprogesterone (PREMPRO) 0.3-1.5 MG  per tablet Take 1 tablet by mouth daily. 90 tablet 3  . hydroxychloroquine (PLAQUENIL) 200 MG tablet Take by mouth daily.    . Multiple Vitamins-Minerals (CENTRUM SILVER ULTRA WOMENS) TABS Take 1 tablet by mouth daily.    . traMADol (ULTRAM) 50 MG tablet Take 50 mg by mouth every 6 (six) hours as needed for pain.     No current facility-administered medications for this visit.    Family History  Problem Relation Age of Onset  . Cancer Father     oral cancer  . Diabetes Paternal Aunt   . Cancer Maternal Grandmother     ovarian cancer  . Hypertension Maternal Grandmother   . Diabetes Maternal Grandfather     ROS:  Pertinent items are noted in HPI.  Otherwise, a comprehensive ROS was negative.  Exam:   BP 120/60 mmHg  Pulse 60  Ht 5' 5.5" (1.664 m)  Wt 164 lb (74.39 kg)  BMI 26.87 kg/m2  LMP 08/22/2008 Height: 5' 5.5" (166.4 cm) Ht Readings from Last 3 Encounters:  03/22/15 5' 5.5" (1.664 m)  01/25/15 5' 5.5" (1.664 m)  03/16/14 5' 5.5" (1.664 m)    General appearance: alert, cooperative and appears stated age Head: Normocephalic, without obvious  abnormality, atraumatic Neck: no adenopathy, supple, symmetrical, trachea midline and thyroid normal to inspection and palpation Lungs: clear to auscultation bilaterally Breasts: normal appearance, no masses or tenderness Heart: regular rate and rhythm Abdomen: soft, non-tender; no masses,  no organomegaly Extremities: extremities normal, atraumatic, no cyanosis or edema Skin: Skin color, texture, turgor normal. No rashes or lesions Lymph nodes: Cervical, supraclavicular, and axillary nodes normal. No abnormal inguinal nodes palpated Neurologic: Grossly normal   Pelvic: External genitalia:  no lesions              Urethra:  normal appearing urethra with no masses, tenderness or lesions              Bartholin's and Skene's: normal                 Vagina: normal appearing vagina with normal color and discharge, no lesions               Cervix: anteverted              Pap taken: Yes.   Bimanual Exam:  Uterus:  normal size, contour, position, consistency, mobility, non-tender              Adnexa: no mass, fullness, tenderness               Rectovaginal: Confirms               Anus:  normal sphincter tone, no lesions  Chaperone present:  yes  A:  Well Woman with normal exam  Postmenopausal on HRT since 01/1998 Remote CIN I 1998 Situational stressors Recent lower abdominal pain - the cause maybe due to constipation.  She did not have PUS secondary to insurance coverage - still may decide to have this done later for completeness and concerns about OV cancer with MGM  P:   Reviewed health and wellness pertinent to exam  Pap smear as above  Will give note for fasting labs to be done in [place] at [facility] is due 05/2015  Will follow with pap and labs  If she decides on PUS to call back  Counseled on breast self exam, mammography screening, use and side effects of HRT, adequate intake of calcium and vitamin D, diet and exercise, Kegel's exercises return annually or prn  An After Visit Summary was printed and given to the patient.

## 2015-03-25 LAB — IPS PAP TEST WITH HPV

## 2015-03-30 ENCOUNTER — Telehealth: Payer: Self-pay | Admitting: Nurse Practitioner

## 2015-03-30 NOTE — Telephone Encounter (Signed)
Yes she did tell me she needed a copy for specialist. Please send

## 2015-03-30 NOTE — Telephone Encounter (Signed)
Called patient and was notified. Patient requesting lab results copy mailed to her home address on file. Address confirmed with patient. However there is not a released form signed by patient. She states she does not have access to fax machine and lives in [place] so can't get here. Patient needs copy before 6/15 for appt with rheumatologist. Patient states she talked with Mrs Chong Sicilian about wanted copies mailed to her.   Please advise.

## 2015-03-30 NOTE — Telephone Encounter (Signed)
Please let pt. Know that labs came back from [facility]:  The CMP shows normal liver, glucose and kidney test.  The lipid panel shows total cholesterol at 162, HDL 68, triglycerides 46, and LDL 85 - all is excellent.  Vit D is 30.7 she should continue with OTC Vit d 1000 IU daily.

## 2015-03-31 NOTE — Telephone Encounter (Signed)
Lab results sent today to confirmed home address.

## 2015-05-24 ENCOUNTER — Other Ambulatory Visit: Payer: Self-pay | Admitting: Nurse Practitioner

## 2015-05-24 DIAGNOSIS — Z1231 Encounter for screening mammogram for malignant neoplasm of breast: Secondary | ICD-10-CM

## 2015-06-23 ENCOUNTER — Other Ambulatory Visit: Payer: Self-pay | Admitting: Nurse Practitioner

## 2015-06-23 ENCOUNTER — Ambulatory Visit (HOSPITAL_COMMUNITY): Payer: No Typology Code available for payment source

## 2015-06-23 ENCOUNTER — Ambulatory Visit (HOSPITAL_COMMUNITY)
Admission: RE | Admit: 2015-06-23 | Discharge: 2015-06-23 | Disposition: A | Discharge status: Home / Self Care | Payer: No Typology Code available for payment source | Source: Ambulatory Visit | Attending: Nurse Practitioner | Admitting: Nurse Practitioner

## 2015-06-23 DIAGNOSIS — Z1231 Encounter for screening mammogram for malignant neoplasm of breast: Secondary | ICD-10-CM | POA: Diagnosis present

## 2015-07-19 ENCOUNTER — Telehealth: Payer: Self-pay | Admitting: *Deleted

## 2015-07-19 NOTE — Telephone Encounter (Signed)
Notified form has been ready to be picked for a while

## 2015-07-19 NOTE — Telephone Encounter (Signed)
Needs [facility] order form for labs to be drawn prior to 10/21 fu appt with md

## 2015-08-12 ENCOUNTER — Inpatient Hospital Stay: Payer: PRIVATE HEALTH INSURANCE | Attending: Hematology and Oncology | Admitting: Hematology and Oncology

## 2015-08-12 ENCOUNTER — Encounter: Payer: Self-pay | Admitting: Hematology and Oncology

## 2015-08-12 VITALS — BP 128/73 | HR 61 | Temp 96.1°F | Resp 18 | Ht 65.5 in | Wt 170.9 lb

## 2015-08-12 DIAGNOSIS — I10 Essential (primary) hypertension: Secondary | ICD-10-CM | POA: Diagnosis not present

## 2015-08-12 DIAGNOSIS — R5382 Chronic fatigue, unspecified: Secondary | ICD-10-CM | POA: Insufficient documentation

## 2015-08-12 DIAGNOSIS — Z7982 Long term (current) use of aspirin: Secondary | ICD-10-CM | POA: Diagnosis not present

## 2015-08-12 DIAGNOSIS — D75839 Thrombocytosis, unspecified: Secondary | ICD-10-CM

## 2015-08-12 DIAGNOSIS — M329 Systemic lupus erythematosus, unspecified: Secondary | ICD-10-CM | POA: Diagnosis not present

## 2015-08-12 DIAGNOSIS — Z79899 Other long term (current) drug therapy: Secondary | ICD-10-CM | POA: Diagnosis not present

## 2015-08-12 DIAGNOSIS — M25569 Pain in unspecified knee: Secondary | ICD-10-CM | POA: Insufficient documentation

## 2015-08-12 DIAGNOSIS — M1990 Unspecified osteoarthritis, unspecified site: Secondary | ICD-10-CM | POA: Diagnosis not present

## 2015-08-12 DIAGNOSIS — D473 Essential (hemorrhagic) thrombocythemia: Secondary | ICD-10-CM | POA: Insufficient documentation

## 2015-08-12 DIAGNOSIS — D649 Anemia, unspecified: Secondary | ICD-10-CM

## 2015-08-12 DIAGNOSIS — M25559 Pain in unspecified hip: Secondary | ICD-10-CM | POA: Insufficient documentation

## 2015-08-12 DIAGNOSIS — D72821 Monocytosis (symptomatic): Secondary | ICD-10-CM | POA: Diagnosis not present

## 2015-08-12 NOTE — Progress Notes (Signed)
[facility]-  Cancer Center  Clinic day:  08/12/2015  Chief Complaint: [name] is a 59 y.o. female with thrombocytosis and persistent monocytosis who is seen for reassessment.  HPI:  The patient has been followed in the hematology clinic since 08/29/2011. She was referred for thrombocytosis as well as mild absolute monocytosis. Initial labs on 08/29/2011 included a hemoglobin of 12.2, MCV 94, platelets 539,000, white count 10,100 with an ANC of 5800. Differential included 57% segs, 25% lymphs and 15% monocytes (absolute monocyte count 1500).  Normal labs included LDH, BCR-ABL, JAK2 V617F mutation. Follow up negative studies included a sedimentation rate and iron studies.  The patient was last seen in medical oncology clinic context 08/06/2014 by Dr. Pandit. At that time, she was doing well without complaint. She did note some chronic fatigue as she was caring for her husband who had been diagnosed with stage IIIB lung cancer.  She noted some arthritis. She was being followed by Dr. Kernodle in clinic for her lupus. She states that she has been in remission for a number of years and hasn't needed treatment.  Symptomatically, she notes that she is fine. She denies any B symptoms. She has some early arthritis. She also has some mild knee and hip pain.  She tore a meniscus in her knee in 2005. She walks a lot. In general, she states that she is "never sick".  She had labs drawn at [facility] on 08/03/2015. CBC included a hematocrit 33.7, hemoglobin 11.3, MCV 96, platelets 531,000, and white count 9600. No differential is available. Iron studies included a ferritin of 77, TIBC 308, and iron saturation 36%.  Patient states that her diet typically includes chicken twice a week. She rarely eats steak or hamburger. Colonoscopy was either in 2009 or 2013. She's never had an EGD.  Past Medical History  Diagnosis Date  . Anemia   . History of lupus 1987  . Hypertension   . Renal  insufficiency 1988    kidney biopsy 1988 and 1998  . Abnormal pap     Past Surgical History  Procedure Laterality Date  . Breast surgery  10/00    right breast benign fibroadenoma    Family History  Problem Relation Age of Onset  . Cancer Father     oral cancer  . Diabetes Paternal Aunt   . Cancer Maternal Grandmother     ovarian cancer  . Hypertension Maternal Grandmother   . Diabetes Maternal Grandfather     Social History:  reports that she has never smoked. She has never used smokeless tobacco. She reports that she drinks about 1.2 oz of alcohol per week. She reports that she does not use illicit drugs.  Her husband has stage IIIB lung cancer.  He has upcoming scans.  The patient is alone today.  She has a part time job.  Allergies:  Allergies  Allergen Reactions  . Sulfa Antibiotics Itching and Rash    Current Medications: Current Outpatient Prescriptions  Medication Sig Dispense Refill  . aspirin 81 MG tablet Take 81 mg by mouth daily.    . Calcium Carbonate-Vitamin D 600-400 MG-UNIT per tablet Take 1 tablet by mouth daily.    . diazepam (VALIUM) 5 MG tablet Take 5 mg by mouth every 6 (six) hours as needed for anxiety.    . enalapril (VASOTEC) 10 MG tablet Take 10 mg by mouth daily.    . estrogen, conjugated,-medroxyprogesterone (PREMPRO) 0.3-1.5 MG per tablet Take 1 tablet by mouth   daily. 90 tablet 3  . hydroxychloroquine (PLAQUENIL) 200 MG tablet Take by mouth daily.    . Multiple Vitamins-Minerals (CENTRUM SILVER ULTRA WOMENS) TABS Take 1 tablet by mouth daily.    . traMADol (ULTRAM) 50 MG tablet Take 50 mg by mouth every 6 (six) hours as needed for pain.    . Vitamin D, Cholecalciferol, 1000 UNITS TABS Take 1 tablet by mouth.     No current facility-administered medications for this visit.    Review of Systems:  GENERAL:  Feels fine.  Never sick.  No fevers, sweats or weight loss. PERFORMANCE STATUS (ECOG):  1 HEENT:  No visual changes, runny nose, sore  throat, mouth sores or tenderness. Lungs: No shortness of breath or cough.  No hemoptysis. Cardiac:  No chest pain, palpitations, orthopnea, or PND. GI:  No nausea, vomiting, diarrhea, constipation, melena or hematochezia.  Colonoscopy in 2009 or 2013 with Dr. Jamal Collin. GU:  No urgency, frequency, dysuria, or hematuria. Musculoskeletal:  Early arthritis.  No back pain.  Knee and hip pain.  No muscle tenderness. Extremities:  No pain or swelling. Skin:  No rashes or skin changes. Neuro:  No headache, numbness or weakness, balance or coordination issues. Endocrine:  No diabetes, thyroid issues, hot flashes or night sweats. Psych:  No mood changes, depression or anxiety. Pain:  No focal pain. Review of systems:  All other systems reviewed and found to be negative.  Physical Exam: Blood pressure 128/73, pulse 61, temperature 96.1 F (35.6 C), temperature source Tympanic, resp. rate 18, height 5' 5.5" (1.664 m), weight 170 lb 13.7 oz (77.5 kg), last menstrual period 08/22/2008. GENERAL:  Well developed, well nourished, sitting comfortably in the exam room in no acute distress. MENTAL STATUS:  Alert and oriented to person, place and time. HEAD:  Shoulder length styled brown hair.  Normocephalic, atraumatic, face symmetric, no Cushingoid features. EYES:  Glasses.  Blue eyes.  Pupils equal round and reactive to light and accomodation.  No conjunctivitis or scleral icterus. ENT:  Oropharynx clear without lesion.  Tongue normal. Mucous membranes moist.  RESPIRATORY:  Clear to auscultation without rales, wheezes or rhonchi. CARDIOVASCULAR:  Regular rate and rhythm without murmur, rub or gallop. ABDOMEN:  Soft, non-tender, with active bowel sounds, and no hepatosplenomegaly.  No masses. SKIN:  No rashes, ulcers or lesions. EXTREMITIES: No edema, no skin discoloration or tenderness.  No palpable cords. LYMPH NODES: No palpable cervical, supraclavicular, axillary or inguinal adenopathy  NEUROLOGICAL:  Unremarkable. PSYCH:  Appropriate.  No visits with results within 3 Day(s) from this visit. Latest known visit with results is:  Office Visit on 03/22/2015  Component Date Value Ref Range Status  . Color, UA 03/22/2015 yellow   Final  . Clarity, UA 03/22/2015 clear   Final  . Glucose, UA 03/22/2015 neg   Final  . Bilirubin, UA 03/22/2015 neg   Final  . Ketones, UA 03/22/2015 neg   Final  . Blood, UA 03/22/2015 neg   Final  . pH, UA 03/22/2015 6.0   Final  . Protein, UA 03/22/2015 neg   Final  . Urobilinogen, UA 03/22/2015 negative   Final  . Nitrite, UA 03/22/2015 neg   Final  . Leukocytes, UA 03/22/2015 Negative   Final  . COMMENTS: 03/22/2015 [facility]   Final-Edited   Comment: [place], [place] [address] [place] [address] GYN CYTOLOGY REPORT  PATIENT NAME:Mcpeters, Meklit I PATHOLOGY#:[id number]: F DOB: [birth year] (Age: 30) MEDICAL RECORD YMEBRA:[id number] DOCTOR:Patricia  Raquel Sarna, FNP DATE OBTAINED:5/31/2016CLIENT:[facility] DATE RECEIVED:6/1/2016OTHER PHYS: DATE SIGNED:03/25/2015 PAP Thinlayer with HPV Final Cytologic Interpretation:       Cervical, ThinLayer with Automated Imaging and Dual Review, CPT 88175      Negative for Intraepithelial Lesions or Malignancy.       ADEQUACY OF SPECIMEN:           Satisfactory for evaluation. The presence or absence of endocervical cells/transformation zone component cannot be determined due to atrophy.             NOTE: This Pap test has been evaluated with computer assisted technology.       Electronically signed by: PAL, CT(ASCP), [address] [place] [address] (Med. Dir.: Darren Wirthwein) pal/6/3/2016The Pap test is a screening mechanism with e                          xcellent but not perfect ability to prevent cervical carcinoma.  It has a low, but  significant, diagnostic error rate. The pap test is suboptimal  for detection of glandular lesions.  It  should be noted that a negative result does not definitively rule out the presence of disease.Ref: DeMay, RM, The Art and Science of Cytopathology,  [employer], [phone]. HPV Results   High Risk HPV -    Not Detected  A result of "Detected" signifies the presence of one or more high risk types of HPV.  The APTIMA HPV Assay is an in vitro nucleic acid amplification test for the qualitative detection of E6/E7 viral messenger RNA (mRNA) from 14 high-risk types of  human papillomavirus (HPV) in cervical specimens. The high-risk HPV types detected by the assay include: 16, 18, 31, 33, 35, 39, 45, 51, 52, 56, 58, 59, 66, and 68. APTIMA HPV method will be performed on the [employer].  The APTIMA HPV Assay is designed to enhance existing methods for the detection of cervical diseas                          e and should be used in conjunction with clinical information derived from other diagnostic and screening tests, physical examinations, and full medical  history in accordance with appropriate patient management procedures. The APTIMA HPV Assay on ThinPrep(tm) PreservCyt(tm) specimens is FDA approved on the [employer].The APTIMA HPV Assay on SurePath(tm) specimens was developed and its performance characteristics determined by [facility].   It has not been cleared or approved by the U.S. Food and Drug Administration.  The FDA has determined that such clearance or approval is not necessary.  This test is used for clinical purposes.  This laboratory is certified under the [place] (CLIA) as qualified to perform high complexity clinical laboratory testing. Electronically signed by: Lavone Nian, [place](ASCP) [address], [place], [place] (Med. Dir.: Darren Mia [place]) Last Menstrual Period: 08/22/2008  Other Clin                          ical Conditions: Other: 1978 CIN I     Assessment:  WILLENE HOLIAN is a 59  y.o. female with thrombocytosis and monocytosis since 2012.  She may have an early myeloproliferative disorder such as CMML without current need for treatment.  She has a mild normocytic anemia. Work-up in 20012 revealed a normal LDH, BCR-ABL, JAK2 V617F mutation, ESR,  and iron studies.  She has a history of quiescent lupus.  She has early arthritis.  Her last colonoscopy was in 2009 or 2013.  Diet is fair.  Symptomatically, she denies any complaint.  Exam is unremarkable.  Hematocrit is 33.7.  Platelet count is 531,000.  No differential is available.  Plan: 1. Review entire medical history, differential of mild anemia, thrombocytosis, and monocytosis. 2. Discuss reactive versus myeloproliferative disorder.  Discuss indications for bone marrow (if treatment indicated). 3. [facility] slip for additional labs:  CBC with diff, Coombs, retic, B12, folate, TSH, ESR. 4. [facility] slip for 6 months and 1 year. 5. RTC in 1 year for MD assessment and review of labs.   Lequita Asal, MD  08/12/2015, 10:25 AM

## 2015-08-12 NOTE — Progress Notes (Signed)
Patient is here for follow-up of thrombocytosis. She states that overall she has been feeling good and offers no complaints today.

## 2015-08-13 DIAGNOSIS — D75839 Thrombocytosis, unspecified: Secondary | ICD-10-CM | POA: Insufficient documentation

## 2015-08-13 DIAGNOSIS — D72821 Monocytosis (symptomatic): Secondary | ICD-10-CM | POA: Insufficient documentation

## 2015-08-13 DIAGNOSIS — D473 Essential (hemorrhagic) thrombocythemia: Secondary | ICD-10-CM | POA: Insufficient documentation

## 2015-08-26 ENCOUNTER — Encounter: Payer: Self-pay | Admitting: Hematology and Oncology

## 2015-09-03 ENCOUNTER — Encounter: Payer: Self-pay | Admitting: Hematology and Oncology

## 2016-01-16 ENCOUNTER — Other Ambulatory Visit: Payer: Self-pay

## 2016-01-16 DIAGNOSIS — Z1231 Encounter for screening mammogram for malignant neoplasm of breast: Secondary | ICD-10-CM

## 2016-02-20 ENCOUNTER — Encounter: Payer: Self-pay | Admitting: Hematology and Oncology

## 2016-03-21 ENCOUNTER — Telehealth: Payer: Self-pay | Admitting: Nurse Practitioner

## 2016-03-21 DIAGNOSIS — D649 Anemia, unspecified: Secondary | ICD-10-CM

## 2016-03-21 DIAGNOSIS — Z0000 Encounter for general adult medical examination without abnormal findings: Secondary | ICD-10-CM

## 2016-03-21 NOTE — Telephone Encounter (Signed)
Yes OK to use those lab orders.

## 2016-03-21 NOTE — Telephone Encounter (Signed)
Patient would like lab orders sent to [facility] [place], [employer]. Patient would like to repeat the labs done last year.

## 2016-03-21 NOTE — Telephone Encounter (Signed)
I have reviewed previous lab orders. Last labs ordered by Kem Boroughs, FNP was a CBC with diff on 01/25/2015. Patient had CBC, Platelet, Iron, TIBC, and Ferritin drawn via [facility] in 07/2015 by Dr.Corcoran.  Routing for review by Kem Boroughs, FNP regarding labs.

## 2016-03-22 NOTE — Telephone Encounter (Signed)
Lab orders to Melvia Heaps CNM for review and signature as Kem Boroughs, FNP is out of the office today.

## 2016-03-22 NOTE — Telephone Encounter (Signed)
Orders for lab work placed electronically via EPIC to [facility] and faxed to [facility] location [address] in [place] with cover sheet and confirmation to [phone]. Patient has been notified and will go have her labs drawn today.  Routing to provider for final review. Patient agreeable to disposition. Will close encounter.

## 2016-03-23 ENCOUNTER — Encounter: Payer: Self-pay | Admitting: Nurse Practitioner

## 2016-03-23 ENCOUNTER — Ambulatory Visit (INDEPENDENT_AMBULATORY_CARE_PROVIDER_SITE_OTHER): Payer: PRIVATE HEALTH INSURANCE | Admitting: Nurse Practitioner

## 2016-03-23 VITALS — BP 110/64 | HR 64 | Resp 14 | Ht 65.5 in | Wt 174.0 lb

## 2016-03-23 DIAGNOSIS — Z0000 Encounter for general adult medical examination without abnormal findings: Secondary | ICD-10-CM | POA: Diagnosis not present

## 2016-03-23 DIAGNOSIS — Z01419 Encounter for gynecological examination (general) (routine) without abnormal findings: Secondary | ICD-10-CM | POA: Diagnosis not present

## 2016-03-23 NOTE — Patient Instructions (Addendum)

## 2016-03-23 NOTE — Progress Notes (Signed)
Patient ID: [name], female   DOB: 07-24-1956, 60 y.o.   MRN: [id number] 60 y.o. G1P1 Married  Caucasian Fe here for annual exam.  Husband with new diagnosis of lung cancer.  She has seen Oncologist about Thrombocytosis without definite diagnosis.  Patient's last menstrual period was 08/22/2008.          Sexually active: not currently (Husband is battling lung cancer)  The current method of family planning is tubal ligation/ Postmenopause.    Exercising: Yes.    walking Smoker:  no  Health Maintenance: Pap:  03-22-15 WNL NEG HR HPV MMG:  06-23-15 WNL BI RADS 1  Colonoscopy:  11/10/2010, divertic, repeat in 5 years  BMD:   03/2010 WNL  TDaP:  09/2007  Shingles: Never Pneumonia: years ago with PCP  Hep C and HIV: done today Labs: labs drawn yesterday with [facility]   reports that she has never smoked. She has never used smokeless tobacco. She reports that she drinks alcohol. She reports that she does not use illicit drugs.  Past Medical History  Diagnosis Date  . Anemia   . History of lupus 1987  . Hypertension   . Renal insufficiency 1988    kidney biopsy 1988 and 1998  . Abnormal pap     Past Surgical History  Procedure Laterality Date  . Breast surgery  10/00    right breast benign fibroadenoma    Current Outpatient Prescriptions  Medication Sig Dispense Refill  . aspirin 81 MG tablet Take 81 mg by mouth daily.    . Calcium Carbonate-Vitamin D 600-400 MG-UNIT per tablet Take 1 tablet by mouth daily.    . diazepam (VALIUM) 5 MG tablet Take 5 mg by mouth every 6 (six) hours as needed for anxiety.    . enalapril (VASOTEC) 10 MG tablet Take 10 mg by mouth daily.    Marland Kitchen estrogen, conjugated,-medroxyprogesterone (PREMPRO) 0.3-1.5 MG per tablet Take 1 tablet by mouth daily. 90 tablet 3  . hydroxychloroquine (PLAQUENIL) 200 MG tablet Take by mouth daily.    . Multiple Vitamins-Minerals (CENTRUM SILVER ULTRA WOMENS) TABS Take 1 tablet by mouth daily.    . traMADol (ULTRAM) 50  MG tablet Take 50 mg by mouth every 6 (six) hours as needed for pain.    . traZODone (DESYREL) 50 MG tablet Take by mouth.    . Vitamin D, Cholecalciferol, 1000 UNITS TABS Take 1 tablet by mouth.     No current facility-administered medications for this visit.    Family History  Problem Relation Age of Onset  . Cancer Father     oral cancer  . Diabetes Paternal Aunt   . Cancer Maternal Grandmother     ovarian cancer  . Hypertension Maternal Grandmother   . Diabetes Maternal Grandfather     ROS:  Pertinent items are noted in HPI.  Otherwise, a comprehensive ROS was negative.  Exam:   BP 110/64 mmHg  Pulse 64  Resp 14  Ht 5' 5.5" (1.664 m)  Wt 174 lb (78.926 kg)  BMI 28.50 kg/m2  LMP 08/22/2008 Height: 5' 5.5" (166.4 cm) Ht Readings from Last 3 Encounters:  03/23/16 5' 5.5" (1.664 m)  08/12/15 5' 5.5" (1.664 m)  03/22/15 5' 5.5" (1.664 m)    General appearance: alert, cooperative and appears stated age Head: Normocephalic, without obvious abnormality, atraumatic Neck: no adenopathy, supple, symmetrical, trachea midline and thyroid normal to inspection and palpation Lungs: clear to auscultation bilaterally Breasts: normal appearance, no masses or tenderness  Heart: regular rate and rhythm Abdomen: soft, non-tender; no masses,  no organomegaly Extremities: extremities normal, atraumatic, no cyanosis or edema Skin: Skin color, texture, turgor normal. No rashes or lesions Lymph nodes: Cervical, supraclavicular, and axillary nodes normal. No abnormal inguinal nodes palpated Neurologic: Grossly normal   Pelvic: External genitalia:  no lesions              Urethra:  normal appearing urethra with no masses, tenderness or lesions              Bartholin's and Skene's: normal                 Vagina: normal appearing vagina with normal color and discharge, no lesions              Cervix: anteverted              Pap taken: No. Bimanual Exam:  Uterus:  normal size, contour,  position, consistency, mobility, non-tender              Adnexa: no mass, fullness, tenderness               Rectovaginal: Confirms               Anus:  normal sphincter tone, no lesions  Chaperone present: no  A:  Well Woman with normal exam  Postmenopausal on HRT since 01/1998 - will taper off in next 1-2 months Remote CIN I 1998 Situational stressors Recent lower abdominal pain - the cause maybe due to constipation. She did not have PUS secondary to insurance coverage - still may decide to have this done later for completeness and concerns about OV cancer with MGM    P:   Reviewed health and wellness pertinent to exam  Pap smear as above  Mammogram is due 06/2016  Will not refill HRT at this time - has some on hand counseled on breast self exam, mammography screening, use and side effects of HRT, adequate intake of calcium and vitamin D, diet and exercise, Kegel's exercises return annually or prn  An After Visit Summary was printed and given to the patient.

## 2016-03-24 LAB — HEPATITIS C ANTIBODY: HCV AB: NEGATIVE

## 2016-03-24 LAB — HIV ANTIBODY (ROUTINE TESTING W REFLEX): HIV 1&2 Ab, 4th Generation: NONREACTIVE

## 2016-03-28 ENCOUNTER — Telehealth: Payer: Self-pay | Admitting: Nurse Practitioner

## 2016-03-28 ENCOUNTER — Telehealth: Payer: Self-pay | Admitting: *Deleted

## 2016-03-28 NOTE — Telephone Encounter (Signed)
Please let pt know that lab results from [facility] 03/22/16 shows anemia -  CBC at 10.5 with slight elevated platelets at 458;  Iron, TIBC, serum Ferritin was all normal.  Comparison with labs done by PCP on October 12/ 2016 there is a decrease in HGB from 11.3 but platelet's are better from 531.   Last year the HGB was 13.3 and  platelets at 281. She still needs to follow with PCP.  Does she need a copy of labs to go to PCP?

## 2016-03-28 NOTE — Telephone Encounter (Signed)
See next phone encounter for review of lab results.  Call to patient, left message to call back.

## 2016-03-28 NOTE — Telephone Encounter (Signed)
See previous phone encounter for message from [place] for message regarding lab results.   Call to patient, left message to call back.

## 2016-03-29 NOTE — Telephone Encounter (Signed)
Call from patient. Advised of results as instructed by Edman Circle, FNP. Patient states she is managed by Dr Nolon Stalls at [facility]. She is due to see them again in October.  Advised will forward results to their office for review and up to them if she needs to be see sooner than currently scheduled appointment.  Recommend she contact Dr Mike Gip office next week to check on their recommendation. Results to be faxed to Dr Mike Gip office @ [phone] and also left message on nurse voice mail line.    Routing to provider for final review. Patient agreeable to disposition. Will close encounter.

## 2016-03-29 NOTE — Progress Notes (Signed)
Encounter reviewed Jervis Trapani, MD   

## 2016-04-20 ENCOUNTER — Encounter: Payer: Self-pay | Admitting: Certified Nurse Midwife

## 2016-05-15 ENCOUNTER — Telehealth: Payer: Self-pay | Admitting: *Deleted

## 2016-05-15 NOTE — Telephone Encounter (Addendum)
Called pt and left message that dr Mike Gip got copy of her labs done by [facility] and sent from [facility].  Dr. Mike Gip felt her hct was drifting down and wanted her to have recheck of labs and I can fill out form for her and then see her in 1-2 days after the labs done. Will await her to call me back. I gave her numbers for [place] since I will be there all day tom.  Patient called back 7/27 and said she has been busy. Her husband has cancer and she has been taking care of him and this weekend he ended up in ER and got admitted with pneumonia.    She is wore down but she is willing to have blood work checked maybe one day next week and then let me know her schedule as to when she may be free to come be seen by dr Mike Gip.  Her husband coming tom and she will pick up slip for [facility] then. And call me back when she knows a schedule that we can get her in.

## 2016-05-18 ENCOUNTER — Encounter: Payer: Self-pay | Admitting: Hematology and Oncology

## 2016-05-31 ENCOUNTER — Other Ambulatory Visit: Payer: Self-pay | Admitting: Nurse Practitioner

## 2016-05-31 DIAGNOSIS — N951 Menopausal and female climacteric states: Secondary | ICD-10-CM

## 2016-05-31 NOTE — Telephone Encounter (Signed)
Medication refill request: Prempro Last AEX:  03/23/16 PG Next AEX: 03/25/17 PG Last MMG (if hormonal medication request): 06/23/15 [id number]  Refill authorized: 03/22/15 #[id number]. Please advise. Thank you.

## 2016-06-15 ENCOUNTER — Telehealth: Payer: Self-pay | Admitting: *Deleted

## 2016-06-15 NOTE — Telephone Encounter (Signed)
Pt called to say that she had labs done at [facility] 7/28 and she has not heard from any one and would like a call back with results.  I called pt back and gave her lab results. She has hgb saame as 6/1 labs 10.5, and then she has hct 31.3 that is higher than it was 6/1. Her plt are more elevated 499 from 456 in June but nothing to do at this time will cont. To monitor. She asked if she should cont. Taking vit d 3 and I told her she should speak to md that ordered her medication because that is not checked in our office. She ran out of the rx and started taking it otc and wanted to see if that is just as good and again I said she will need to check with PCP. She really only see gyn in [place] once a year and that is person who wrote rx and advised her to call that person see what they would want to do or rec: for her from vit d standpoint. She is tired and wore down from caring for her husband who has cancer but she does not feel any different from several months ago. I told her to call if she has fevers, infections, had blood clot or TIA and otherwise she will come to her October appt. She is agreeable to this plan

## 2016-06-26 ENCOUNTER — Ambulatory Visit
Admission: RE | Admit: 2016-06-26 | Discharge: 2016-06-26 | Disposition: A | Discharge status: Home / Self Care | Payer: PRIVATE HEALTH INSURANCE | Source: Ambulatory Visit

## 2016-06-26 ENCOUNTER — Other Ambulatory Visit: Payer: Self-pay | Admitting: Nurse Practitioner

## 2016-06-26 DIAGNOSIS — Z1231 Encounter for screening mammogram for malignant neoplasm of breast: Secondary | ICD-10-CM

## 2016-08-06 ENCOUNTER — Encounter: Payer: Self-pay | Admitting: Hematology and Oncology

## 2016-08-10 ENCOUNTER — Inpatient Hospital Stay: Payer: PRIVATE HEALTH INSURANCE | Attending: Internal Medicine | Admitting: Internal Medicine

## 2016-08-10 ENCOUNTER — Other Ambulatory Visit: Payer: Self-pay | Admitting: *Deleted

## 2016-08-10 VITALS — BP 116/69 | HR 72 | Temp 97.3°F | Wt 180.7 lb

## 2016-08-10 DIAGNOSIS — F419 Anxiety disorder, unspecified: Secondary | ICD-10-CM | POA: Diagnosis not present

## 2016-08-10 DIAGNOSIS — D473 Essential (hemorrhagic) thrombocythemia: Secondary | ICD-10-CM | POA: Diagnosis not present

## 2016-08-10 DIAGNOSIS — R5383 Other fatigue: Secondary | ICD-10-CM | POA: Insufficient documentation

## 2016-08-10 DIAGNOSIS — M329 Systemic lupus erythematosus, unspecified: Secondary | ICD-10-CM | POA: Diagnosis not present

## 2016-08-10 DIAGNOSIS — F329 Major depressive disorder, single episode, unspecified: Secondary | ICD-10-CM | POA: Diagnosis not present

## 2016-08-10 DIAGNOSIS — M25569 Pain in unspecified knee: Secondary | ICD-10-CM

## 2016-08-10 DIAGNOSIS — R5382 Chronic fatigue, unspecified: Secondary | ICD-10-CM | POA: Diagnosis not present

## 2016-08-10 DIAGNOSIS — Z636 Dependent relative needing care at home: Secondary | ICD-10-CM | POA: Diagnosis not present

## 2016-08-10 DIAGNOSIS — M25559 Pain in unspecified hip: Secondary | ICD-10-CM

## 2016-08-10 DIAGNOSIS — Z7982 Long term (current) use of aspirin: Secondary | ICD-10-CM

## 2016-08-10 DIAGNOSIS — D75839 Thrombocytosis, unspecified: Secondary | ICD-10-CM

## 2016-08-10 DIAGNOSIS — D72821 Monocytosis (symptomatic): Secondary | ICD-10-CM

## 2016-08-10 DIAGNOSIS — M3214 Glomerular disease in systemic lupus erythematosus: Secondary | ICD-10-CM

## 2016-08-10 DIAGNOSIS — D649 Anemia, unspecified: Secondary | ICD-10-CM

## 2016-08-10 DIAGNOSIS — M1990 Unspecified osteoarthritis, unspecified site: Secondary | ICD-10-CM

## 2016-08-10 DIAGNOSIS — Z23 Encounter for immunization: Secondary | ICD-10-CM

## 2016-08-10 DIAGNOSIS — Z79899 Other long term (current) drug therapy: Secondary | ICD-10-CM

## 2016-08-10 MED ORDER — INFLUENZA VAC SPLIT QUAD 0.5 ML IM SUSY: 0.5000 mL | PREFILLED_SYRINGE | Freq: Once | INTRAMUSCULAR | Status: DC

## 2016-08-10 NOTE — Progress Notes (Signed)
   Chief complaint: Follow up for chronic thrombocytosis and monocytosis since 2012.  History of present illness: This is a 60 year old lady who was last seen by Dr. Susy Manor in October 2060 it was felt that she may have early myeloproliferative disorder such as CMML however in the past her workup including BCR -ABL, jAK 2 mutation and iron studies were normal. She has a history of lupus and is been on plaque quinol with excellent control of her arthritic symptoms. She returns today complaining of from continued fatigue and anxiety. She is considerably stressed about being the sole caregiver for her husband who apparently has stage IV lung cancer. She states she feels overwhelmed with taking care of him. She has been somewhat depressed and has been crying a lot. However she does not admit to any feelings of self alert.  All other systems review is negative at this time.  Physical exam:  Vital  are stable with a blood pressure 116/69 pulse 72 and regular temperature 97.3 weight is stable at 180 pounds.   She is tearful slightly anxious. No pallor or icterus skin rash. No palpable lumps in the neck supratrochlear axillary areas. Lungs are clear to percussion and auscultation heart rhythm is regular. No extremity edema was noted. She is alert awake equated 3 no gross focal sensory or noted motor deficits. Muzquiz schedule exam shows no joint deformities of rheumatoid nodules.  Results review:  A CBC from [facility] from 08/07/2016 has been scanned into the electronic medical record shows Hemoglobin at 11 white cell count 10.6 platelets 544. Labs for [facility] 08/2015 neg coombs, platelets 535 Normal retic Platelet ct 281 in 01/2015 04/2016- platelets 499 Bilateral screening mammogram was benign BI-RADS Category 1 from September 2017 audited by her primary care needing and follow-up.   Impression and plan: Long-standing thrombocytosis and monocytosis. Platelet Count is stable, she has not  had any history of bleeding or ischemic episodes in the past. She remains on Aspirin should continue the same. I suspect that this is reactive to SLE. She follows with dermatology through [facility] and continues to take Plaquenil for it. I suggest no intervention for the platelet count at this time.  With regard to her anxiety depression, this appears to be related to the stress she is going through with her terminally ill husband. She declines referral to social services she is not receptive to having additional support at home. She has not been sleeping well and she requests a prescription for alprazolam. I've explained to her that she might benefit from an antidepressant at this time rather than benzodiazepines this will help her with coping with her husband's illness. She is not currently interested in referral to counseling stating that she is too busy to make more appointments. A prescription for Zoloft to begin at 25 mg once daily and to increase if tolerated gradually up to 75 mg once daily has been given. Potential side effects have been explained to her. She was advised to contact us for any side effects. I'll see her back in follow-up in one month to review response and to titrate the dose adequately.  Review of her medications shows that she has had Valium and Trazodone in the past patient tells me that she does not have anymore of those left and has not been taking them for some time now.

## 2016-08-10 NOTE — Progress Notes (Signed)
Patient is here for follow up. She will get her flu shot today.

## 2016-08-13 ENCOUNTER — Telehealth: Payer: Self-pay | Admitting: *Deleted

## 2016-08-13 NOTE — Telephone Encounter (Signed)
States she was to get rx for Zoloft sent to pharmacy and it did not get sent. Please sen din Rx. Also states she was to get Flu vaccine and left with out getting it and does not want to be charged for it.

## 2016-08-14 MED ORDER — SERTRALINE HCL 25 MG PO TABS: ORAL_TABLET | ORAL | 0 refills | Status: DC

## 2016-08-21 DIAGNOSIS — M329 Systemic lupus erythematosus, unspecified: Secondary | ICD-10-CM | POA: Insufficient documentation

## 2016-08-21 DIAGNOSIS — F32A Depression, unspecified: Secondary | ICD-10-CM | POA: Insufficient documentation

## 2016-08-21 DIAGNOSIS — F329 Major depressive disorder, single episode, unspecified: Secondary | ICD-10-CM | POA: Insufficient documentation

## 2016-09-07 ENCOUNTER — Inpatient Hospital Stay: Payer: PRIVATE HEALTH INSURANCE | Attending: Internal Medicine | Admitting: Internal Medicine

## 2016-09-07 ENCOUNTER — Inpatient Hospital Stay: Payer: PRIVATE HEALTH INSURANCE

## 2016-09-07 VITALS — BP 136/70 | HR 83 | Temp 98.8°F | Wt 179.5 lb

## 2016-09-07 DIAGNOSIS — D473 Essential (hemorrhagic) thrombocythemia: Secondary | ICD-10-CM

## 2016-09-07 DIAGNOSIS — M329 Systemic lupus erythematosus, unspecified: Secondary | ICD-10-CM | POA: Insufficient documentation

## 2016-09-07 DIAGNOSIS — F329 Major depressive disorder, single episode, unspecified: Secondary | ICD-10-CM | POA: Diagnosis not present

## 2016-09-07 DIAGNOSIS — D72821 Monocytosis (symptomatic): Secondary | ICD-10-CM

## 2016-09-07 DIAGNOSIS — N183 Chronic kidney disease, stage 3 unspecified: Secondary | ICD-10-CM | POA: Insufficient documentation

## 2016-09-07 DIAGNOSIS — Z23 Encounter for immunization: Secondary | ICD-10-CM | POA: Diagnosis not present

## 2016-09-07 DIAGNOSIS — D649 Anemia, unspecified: Secondary | ICD-10-CM

## 2016-09-07 DIAGNOSIS — D75839 Thrombocytosis, unspecified: Secondary | ICD-10-CM

## 2016-09-07 DIAGNOSIS — F419 Anxiety disorder, unspecified: Secondary | ICD-10-CM | POA: Insufficient documentation

## 2016-09-07 LAB — CBC WITH DIFFERENTIAL/PLATELET
BASOS ABS: 0.1 10*3/uL (ref 0–0.1)
Basophils Relative: 1 %
EOS ABS: 0.1 10*3/uL (ref 0–0.7)
EOS PCT: 1 %
HCT: 33.3 % — ABNORMAL LOW (ref 35.0–47.0)
Hemoglobin: 11.3 g/dL — ABNORMAL LOW (ref 12.0–16.0)
LYMPHS PCT: 20 %
Lymphs Abs: 2.4 10*3/uL (ref 1.0–3.6)
MCH: 32.4 pg (ref 26.0–34.0)
MCHC: 33.9 g/dL (ref 32.0–36.0)
MCV: 95.7 fL (ref 80.0–100.0)
Monocytes Absolute: 1.6 10*3/uL — ABNORMAL HIGH (ref 0.2–0.9)
Monocytes Relative: 13 %
NEUTROS PCT: 65 %
Neutro Abs: 8.1 10*3/uL — ABNORMAL HIGH (ref 1.4–6.5)
PLATELETS: 455 10*3/uL — AB (ref 150–440)
RBC: 3.48 MIL/uL — AB (ref 3.80–5.20)
RDW: 13.5 % (ref 11.5–14.5)
WBC: 12.4 10*3/uL — AB (ref 3.6–11.0)

## 2016-09-07 MED ORDER — INFLUENZA VAC SPLIT QUAD 0.5 ML IM SUSY
0.5000 mL | PREFILLED_SYRINGE | Freq: Once | INTRAMUSCULAR | Status: AC
  Administered 2016-09-07: 0.5 mL via INTRAMUSCULAR

## 2016-09-07 NOTE — Progress Notes (Signed)
  Returns to review dosing of Zoloft that was begun at her last visit, she only had been taking 25 mg per day, but already noticed improvement in sleep and anxiety. No sideeffects noted so far. Her husband continues to struggle with terminal cancer.  CBC Latest Ref Rng & Units 09/07/2016 01/25/2015 03/16/2014  WBC 3.6 - 11.0 K/uL 12.4(H) 6.2 -  Hemoglobin 12.0 - 16.0 g/dL 11.3(L) 13.3 11.5(L)  Hematocrit 35.0 - 47.0 % 33.3(L) 41.8 -  Platelets 150 - 440 K/uL 455(H) 281 -     Impression and Plan:  Reactive thrombocytosis due to lupus on palquenil Cbc stable  Anxiety depression , increase zoloft to 50 per day Ov back in 3 months See my last note dated 10/ 20/2017 for details

## 2016-09-11 ENCOUNTER — Telehealth: Payer: Self-pay | Admitting: *Deleted

## 2016-09-11 ENCOUNTER — Other Ambulatory Visit: Payer: Self-pay | Admitting: Nurse Practitioner

## 2016-09-11 MED ORDER — SERTRALINE HCL 50 MG PO TABS: 50.0000 mg | ORAL_TABLET | Freq: Every day | ORAL | 1 refills | Status: DC

## 2016-09-11 NOTE — Telephone Encounter (Signed)
Needs refill on sertraline, E scribed

## 2016-09-11 NOTE — Telephone Encounter (Signed)
Medication refill request: sertraline  Last AEX:  03/23/16 PG Next AEX: 03/25/17 PG Last MMG (if hormonal medication request): 06/26/16 BIRADS1:neg  Refill authorized: 08/14/16 #[id number]/0R.ordering provider Creola Corn, MD not at this office. Rx denied.

## 2016-12-05 ENCOUNTER — Other Ambulatory Visit: Payer: Self-pay | Admitting: *Deleted

## 2016-12-05 DIAGNOSIS — D75839 Thrombocytosis, unspecified: Secondary | ICD-10-CM

## 2016-12-05 DIAGNOSIS — D473 Essential (hemorrhagic) thrombocythemia: Secondary | ICD-10-CM

## 2016-12-07 ENCOUNTER — Encounter: Payer: Self-pay | Admitting: Hematology and Oncology

## 2016-12-07 ENCOUNTER — Inpatient Hospital Stay: Payer: PRIVATE HEALTH INSURANCE | Attending: Hematology and Oncology

## 2016-12-07 ENCOUNTER — Inpatient Hospital Stay (HOSPITAL_BASED_OUTPATIENT_CLINIC_OR_DEPARTMENT_OTHER): Payer: PRIVATE HEALTH INSURANCE | Admitting: Hematology and Oncology

## 2016-12-07 VITALS — BP 143/75 | HR 75 | Temp 96.5°F | Resp 18 | Wt 191.4 lb

## 2016-12-07 DIAGNOSIS — Z79899 Other long term (current) drug therapy: Secondary | ICD-10-CM | POA: Diagnosis not present

## 2016-12-07 DIAGNOSIS — F329 Major depressive disorder, single episode, unspecified: Secondary | ICD-10-CM | POA: Diagnosis not present

## 2016-12-07 DIAGNOSIS — I10 Essential (primary) hypertension: Secondary | ICD-10-CM | POA: Insufficient documentation

## 2016-12-07 DIAGNOSIS — D75839 Thrombocytosis, unspecified: Secondary | ICD-10-CM

## 2016-12-07 DIAGNOSIS — Z7982 Long term (current) use of aspirin: Secondary | ICD-10-CM | POA: Insufficient documentation

## 2016-12-07 DIAGNOSIS — M129 Arthropathy, unspecified: Secondary | ICD-10-CM

## 2016-12-07 DIAGNOSIS — R5383 Other fatigue: Secondary | ICD-10-CM | POA: Insufficient documentation

## 2016-12-07 DIAGNOSIS — R635 Abnormal weight gain: Secondary | ICD-10-CM | POA: Diagnosis not present

## 2016-12-07 DIAGNOSIS — D473 Essential (hemorrhagic) thrombocythemia: Secondary | ICD-10-CM

## 2016-12-07 DIAGNOSIS — Z8041 Family history of malignant neoplasm of ovary: Secondary | ICD-10-CM | POA: Insufficient documentation

## 2016-12-07 DIAGNOSIS — N2889 Other specified disorders of kidney and ureter: Secondary | ICD-10-CM

## 2016-12-07 DIAGNOSIS — D649 Anemia, unspecified: Secondary | ICD-10-CM

## 2016-12-07 DIAGNOSIS — D72821 Monocytosis (symptomatic): Secondary | ICD-10-CM | POA: Insufficient documentation

## 2016-12-07 DIAGNOSIS — F419 Anxiety disorder, unspecified: Secondary | ICD-10-CM | POA: Insufficient documentation

## 2016-12-07 LAB — CBC WITH DIFFERENTIAL/PLATELET
Basophils Absolute: 0.2 10*3/uL — ABNORMAL HIGH (ref 0–0.1)
Basophils Relative: 1 %
Eosinophils Absolute: 0.4 10*3/uL (ref 0–0.7)
Eosinophils Relative: 3 %
HCT: 33.2 % — ABNORMAL LOW (ref 35.0–47.0)
Hemoglobin: 11.3 g/dL — ABNORMAL LOW (ref 12.0–16.0)
Lymphocytes Relative: 28 %
Lymphs Abs: 3.2 10*3/uL (ref 1.0–3.6)
MCH: 32.3 pg (ref 26.0–34.0)
MCHC: 34 g/dL (ref 32.0–36.0)
MCV: 94.8 fL (ref 80.0–100.0)
Monocytes Absolute: 1.5 10*3/uL — ABNORMAL HIGH (ref 0.2–0.9)
Monocytes Relative: 13 %
Neutro Abs: 6.1 10*3/uL (ref 1.4–6.5)
Neutrophils Relative %: 55 %
Platelets: 490 10*3/uL — ABNORMAL HIGH (ref 150–440)
RBC: 3.5 MIL/uL — ABNORMAL LOW (ref 3.80–5.20)
RDW: 13.7 % (ref 11.5–14.5)
WBC: 11.4 10*3/uL — ABNORMAL HIGH (ref 3.6–11.0)

## 2016-12-07 NOTE — Progress Notes (Signed)
[name] day:  12/07/2016  Chief Complaint: [name] is a 61 y.o. female with chronic thrombocytosis and monocytosis who is seen for reassessment.  HPI:  The patient was last seen in the hematology [name] by me on 08/12/2015.  At that time, she was seen for initial assessment by me.  She denied any complaint.  Exam was unremarkable.  Hematocrit was 33.7, platelets 531,000, and WBC 9600.  We discussed reactive versus myeloproliferative disorder.  She was felt to possibly have CMML.  We discussed indications for bone marrow (if treatment indicated).  She was seen by Dr. Wyatt Portela on 08/10/2016 and 09/07/2016.  She was felt to have reactive thrombocytosis due to lupus.  She noted fatigue, anxiet and depression.  She was started on Zoloft and advanced to 50 mg a day.  Her husband died on 2016/12/10.  She notes weight gain.  She would like to come off Zoloft.  She denies any B symptoms, bruising or bleeding or adenopathy.   Past Medical History:  Diagnosis Date  . Abnormal pap   . Anemia   . History of lupus 1987  . Hypertension   . Renal insufficiency 1988   kidney biopsy 1988 and 1998    Past Surgical History:  Procedure Laterality Date  . BREAST SURGERY  10/00   right breast benign fibroadenoma    Family History  Problem Relation Age of Onset  . Cancer Father     oral cancer  . Cancer Maternal Grandmother     ovarian cancer  . Hypertension Maternal Grandmother   . Diabetes Maternal Grandfather   . Diabetes Paternal Aunt     Social History:  reports that she has never smoked. She has never used smokeless tobacco. She reports that she drinks alcohol. She reports that she does not use drugs.  Her husband had stage IIIB lung cancer.  He died Dec 10, 2016.  She has a part time job. The patient is alone today.    Allergies:  Allergies  Allergen Reactions  . Sulfa Antibiotics Itching and Rash    Current  Medications: Current Outpatient Prescriptions  Medication Sig Dispense Refill  . aspirin 81 MG tablet Take 81 mg by mouth daily.    . enalapril (VASOTEC) 10 MG tablet Take 10 mg by mouth daily.    . hydroxychloroquine (PLAQUENIL) 200 MG tablet Take 200 mg by mouth daily.     . Multiple Vitamins-Minerals (CENTRUM SILVER ULTRA WOMENS) TABS Take 1 tablet by mouth daily.    Marland Kitchen PREMPRO 0.3-1.5 MG tablet TAKE 1 TABLET BY MOUTH DAILY (Patient taking differently: Take 1 tablet by mouth every other day. ) 84 tablet 3  . sertraline (ZOLOFT) 50 MG tablet Take 1 tablet (50 mg total) by mouth daily. 30 tablet 1  . diazepam (VALIUM) 5 MG tablet Take 5 mg by mouth every 6 (six) hours as needed for anxiety.    . traZODone (DESYREL) 50 MG tablet Take by mouth.    . Vitamin D, Cholecalciferol, 1000 UNITS TABS Take 1 tablet by mouth.     No current facility-administered medications for this visit.     Review of Systems:  GENERAL:  Feels "ok".  No fevers or sweats. Weight gain. PERFORMANCE STATUS (ECOG):  1 HEENT:  No visual changes, runny nose, sore throat, mouth sores or tenderness. Lungs: No shortness of breath or cough.  No hemoptysis. Cardiac:  No chest pain, palpitations, orthopnea, or PND. GI:  No nausea, vomiting,  diarrhea, constipation, melena or hematochezia.  Colonoscopy in 2009 or 2013 with Dr. Jamal Collin. GU:  No urgency, frequency, dysuria, or hematuria. Musculoskeletal:  Arthritis.  No back pain.  Knee and hip pain.  No muscle tenderness. Extremities:  No pain or swelling. Skin:  No rashes or skin changes. Neuro:  No headache, numbness or weakness, balance or coordination issues. Endocrine:  No diabetes, thyroid issues, hot flashes or night sweats. Psych:  Recent loss of her husband.  Would like to come off Zoloft.   Pain:  No focal pain. Review of systems:  All other systems reviewed and found to be negative.  Physical Exam: Last menstrual period 08/22/2008. GENERAL:  Well developed, well  nourished, woman sitting comfortably in the exam room in no acute distress. MENTAL STATUS:  Alert and oriented to person, place and time. HEAD:  Shoulder length styled brown hair.  Normocephalic, atraumatic, face symmetric, no Cushingoid features. EYES:  Glasses.  Blue eyes.  Pupils equal round and reactive to light and accomodation.  No conjunctivitis or scleral icterus. ENT:  Oropharynx clear without lesion.  Tongue normal. Mucous membranes moist.  RESPIRATORY:  Clear to auscultation without rales, wheezes or rhonchi. CARDIOVASCULAR:  Regular rate and rhythm without murmur, rub or gallop. ABDOMEN:  Soft, non-tender, with active bowel sounds, and no hepatosplenomegaly.  No masses. SKIN:  No rashes, ulcers or lesions. EXTREMITIES: No edema, no skin discoloration or tenderness.  No palpable cords. LYMPH NODES: No palpable cervical, supraclavicular, axillary or inguinal adenopathy  NEUROLOGICAL: Unremarkable. PSYCH:  Appropriate.   Appointment on 12/07/2016  Component Date Value Ref Range Status  . WBC 12/07/2016 11.4* 3.6 - 11.0 K/uL Final  . RBC 12/07/2016 3.50* 3.80 - 5.20 MIL/uL Final  . Hemoglobin 12/07/2016 11.3* 12.0 - 16.0 g/dL Final  . HCT 12/07/2016 33.2* 35.0 - 47.0 % Final  . MCV 12/07/2016 94.8  80.0 - 100.0 fL Final  . MCH 12/07/2016 32.3  26.0 - 34.0 pg Final  . MCHC 12/07/2016 34.0  32.0 - 36.0 g/dL Final  . RDW 12/07/2016 13.7  11.5 - 14.5 % Final  . Platelets 12/07/2016 490* 150 - 440 K/uL Final  . Neutrophils Relative % 12/07/2016 55  % Final  . Neutro Abs 12/07/2016 6.1  1.4 - 6.5 K/uL Final  . Lymphocytes Relative 12/07/2016 28  % Final  . Lymphs Abs 12/07/2016 3.2  1.0 - 3.6 K/uL Final  . Monocytes Relative 12/07/2016 13  % Final  . Monocytes Absolute 12/07/2016 1.5* 0.2 - 0.9 K/uL Final  . Eosinophils Relative 12/07/2016 3  % Final  . Eosinophils Absolute 12/07/2016 0.4  0 - 0.7 K/uL Final  . Basophils Relative 12/07/2016 1  % Final  . Basophils Absolute  12/07/2016 0.2* 0 - 0.1 K/uL Final    Assessment:  [name] is a 61 y.o. female with chronic thrombocytosis and monocytosis since 2012.  She may have an early myeloproliferative disorder such as CMML without current need for treatment.  She has a mild normocytic anemia.   Work-up in 20012 revealed a normal LDH, BCR-ABL, JAK2 V617F mutation, ESR, and iron studies.  She has a history of quiescent lupus.  She has early arthritis.  Her last colonoscopy was in 2009 or 2013.  Diet is fair.  Symptomatically, she denies any complaint.  Exam is unremarkable.  Hematocrit is 33.2.  Platelet count is 490,000.  She has persistent monocytosis (1500).  Plan: 1.  Review blood counts. Discuss probable myeloproliferative disorder. At this point, I  do not feel that her thrombocytosis is reactive and due to lupus.  Her lupus is quiescent and her sedimentation rate is normal.  Depending on blood counts and symptomatology, may pursue bone marrow in the future. Several questions were asked and answered. 2.  Discuss patient's desire to come off Zoloft.  Discussed with pharmacy.  Discuss tapering slowly.  Decrease dose to 25 mg a day.  Her current pills are scored. 3.  [facility] slip for 6 months (CBC with diff, CALR, MPL) and 1 year (CBC with diff, CMP). 4.  RTC in 1 year for MD assessment and review of labs.   Lequita Asal, MD  12/07/2016, 2:16 PM

## 2016-12-07 NOTE — Progress Notes (Signed)
Patient states she is gaining weight.  Recently lost her husband who was a patient here at the [facility].  Here for one year follow up regarding thrombocytosis.

## 2016-12-10 ENCOUNTER — Other Ambulatory Visit: Payer: PRIVATE HEALTH INSURANCE

## 2016-12-10 ENCOUNTER — Ambulatory Visit: Payer: PRIVATE HEALTH INSURANCE

## 2016-12-24 ENCOUNTER — Other Ambulatory Visit: Payer: Self-pay | Admitting: *Deleted

## 2016-12-24 MED ORDER — SERTRALINE HCL 25 MG PO TABS: 12.5000 mg | ORAL_TABLET | Freq: Every day | ORAL | 0 refills | Status: DC

## 2016-12-24 NOTE — Telephone Encounter (Signed)
Called to report that she is doing "OK" with the Zoloft 25 mg dose since 12/07/16. Needs a refill sent to pharmacy, is the dose to decrease again or stay the same as you were weaning her off of it. Please advise.

## 2016-12-24 NOTE — Telephone Encounter (Signed)
I called and left detailed message regarding dosing of her Zoloft and and to call back for any questions

## 2016-12-24 NOTE — Telephone Encounter (Signed)
  This was a medication that Dr. Bangladesh prescribed and the patient wanted to come off of.  We can decrease her down to 12.5 mg a day for a week (or two) then every other day for a week (or two).  M

## 2017-01-14 ENCOUNTER — Telehealth: Payer: Self-pay | Admitting: *Deleted

## 2017-01-14 NOTE — Telephone Encounter (Signed)
Asking if nausea is a side effect of coming off Zoloft. She took her last Zoloft 25 mg tab on Saturday. Please advise

## 2017-01-14 NOTE — Telephone Encounter (Signed)
Per Dr Mike Gip, if she wants we can order more for her. She was to wean off to 12.5 mg qod before stopping. I checked with patient and she confirmed that she did in fact wean off to 12.5 dily for 1 week then 12.5 mg QOD before stopping. She does not wish to go back on zoloft

## 2017-01-15 NOTE — Telephone Encounter (Signed)
  Sounds good.  M  

## 2017-03-21 ENCOUNTER — Encounter: Payer: Self-pay | Admitting: Nurse Practitioner

## 2017-03-21 NOTE — Progress Notes (Signed)
Patient ID: [name], female   DOB: June 02, 1956, 61 y.o.   MRN: [id number]  61 y.o. [id number] Married  Caucasian Fe here for annual exam.  After her husband passed in January she was given Zoloft RX and immediately had weight gain.  She is now off Zoloft since early March and trying hard to loose weight.  She remains on Prempro 0.3-1.5 mg at every 3-4 days.  For the most part can handle the vaso symptoms -  But even now trying to taper more causes more symptoms.  She has CKD stage III from the Lupus and is followed closely by nephrologist.  She is having ankle edema and did have some fluid pills on hand at home - thinks maybe she needs a refill.  Pt is asked to contact nephrology and see if they want her to do this.  Husband diagnosed with lung cancer 2015 and passed 11/21/2016 at age 35.  Patient's last menstrual period was 08/22/2008.          Sexually active: No.  The current method of family planning is tubal ligation and post menopausal status.    Exercising: Yes.    Walk, Leg lifts Smoker:  no  Health Maintenance: Pap: 03/22/15, Negative with neg HR HPV  03/15/14, Negative with neg HR HPV History of Abnormal Pap: yes, CIN-I in 1978 MMG: 06/26/16, 3D-yes, Density Category C, Bi-Rads 1:  Negative Self Breast exams: no Colonoscopy:07/27/2005 & 11/10/10, diverticulosis, repeat in 5 years Dr. Jamal Collin in [place] BMD: 03/2010, normal per patient TDaP: 09/2007 Shingles: No Pneumonia: PCP years ago Hep C and HIV: 03/23/16 Labs: Rheumatologist     reports that she has never smoked. She has never used smokeless tobacco. She reports that she drinks about 0.6 - 1.2 oz of alcohol per week . She reports that she does not use drugs.  Past Medical History:  Diagnosis Date  . Abnormal pap   . Anemia   . History of lupus 1987  . Hypertension   . Renal insufficiency 1988   kidney biopsy 1988 and 1998    Past Surgical History:  Procedure Laterality Date  . BREAST SURGERY  10/00   right breast  benign fibroadenoma    Current Outpatient Prescriptions  Medication Sig Dispense Refill  . ALPRAZolam (XANAX) 0.25 MG tablet Take 0.25 mg by mouth as needed.    Marland Kitchen aspirin 81 MG tablet Take 81 mg by mouth daily.    . diazepam (VALIUM) 5 MG tablet Take 5 mg by mouth every 6 (six) hours as needed for anxiety.    . enalapril (VASOTEC) 10 MG tablet Take 10 mg by mouth daily.    Marland Kitchen estrogen, conjugated,-medroxyprogesterone (PREMPRO) 0.3-1.5 MG tablet Take 1 tablet by mouth daily. 84 tablet 3  . hydroxychloroquine (PLAQUENIL) 200 MG tablet Take 200 mg by mouth daily.     . Multiple Vitamins-Minerals (CENTRUM SILVER ULTRA WOMENS) TABS Take 1 tablet by mouth daily.    . traZODone (DESYREL) 50 MG tablet Take by mouth.     No current facility-administered medications for this visit.     Family History  Problem Relation Age of Onset  . Cancer Father        oral cancer  . Cancer Maternal Grandmother        ovarian cancer  . Hypertension Maternal Grandmother   . Diabetes Maternal Grandfather   . Diabetes Paternal Aunt     ROS:  Pertinent items are noted in HPI.  Otherwise, a comprehensive ROS  was negative.  Exam:   BP 130/60 (BP Location: Right Arm, Patient Position: Sitting, Cuff Size: Normal)   Pulse 84   Resp 20   Ht 5' 5.5" (1.664 m)   Wt 190 lb 9.6 oz (86.5 kg)   LMP 08/22/2008   BMI 31.24 kg/m  Height: 5' 5.5" (166.4 cm) Ht Readings from Last 3 Encounters:  03/25/17 5' 5.5" (1.664 m)  03/23/16 5' 5.5" (1.664 m)  08/12/15 5' 5.5" (1.664 m)    General appearance: alert, cooperative and appears stated age Head: Normocephalic, without obvious abnormality, atraumatic Neck: no adenopathy, supple, symmetrical, trachea midline and thyroid normal to inspection and palpation Lungs: clear to auscultation bilaterally Breasts: normal appearance, no masses or tenderness Heart: regular rate and rhythm Abdomen: soft, non-tender; no masses,  no organomegaly Extremities: extremities normal,  atraumatic, no cyanosis or edema Skin: Skin color, texture, turgor normal. No rashes or lesions Lymph nodes: Cervical, supraclavicular, and axillary nodes normal. No abnormal inguinal nodes palpated Neurologic: Grossly normal   Pelvic: External genitalia:  no lesions              Urethra:  normal appearing urethra with no masses, tenderness or lesions              Bartholin's and Skene's: normal                 Vagina: normal appearing vagina with normal color and discharge, no lesions              Cervix: anteverted              Pap taken: Yes.  per request Bimanual Exam:  Uterus:  normal size, contour, position, consistency, mobility, non-tender              Adnexa: no mass, fullness, tenderness               Rectovaginal: Confirms               Anus:  normal sphincter tone, no lesions  Chaperone present: yes  A:  Well Woman with normal exam  Postmenopausal on HRT since 01/1998 -trying to taper  Remote history of CIN I 1998  Situational stressors with grief reaction    P:   Reviewed health and wellness pertinent to exam  Pap smear: yes  Mammogram is due 06/2017  Pt was able to call while here and make apt for Colonoscopy with MD in [place] - could not make apt for her as not in Epic.  Refill on Prempro for a year and continue to taper  Counseled with risk of DVT, CVA, cancer, etc.  Counseled on breast self exam, mammography screening, use and side effects of HRT, adequate intake of calcium and vitamin D, diet and exercise return annually or prn  An After Visit Summary was printed and given to the patient.

## 2017-03-25 ENCOUNTER — Encounter: Payer: Self-pay | Admitting: *Deleted

## 2017-03-25 ENCOUNTER — Ambulatory Visit (INDEPENDENT_AMBULATORY_CARE_PROVIDER_SITE_OTHER): Payer: PRIVATE HEALTH INSURANCE | Admitting: Nurse Practitioner

## 2017-03-25 ENCOUNTER — Encounter: Payer: Self-pay | Admitting: Nurse Practitioner

## 2017-03-25 ENCOUNTER — Other Ambulatory Visit (HOSPITAL_COMMUNITY)
Admission: RE | Admit: 2017-03-25 | Discharge: 2017-03-25 | Disposition: A | Discharge status: Home / Self Care | Payer: PRIVATE HEALTH INSURANCE | Source: Ambulatory Visit | Attending: Nurse Practitioner | Admitting: Nurse Practitioner

## 2017-03-25 VITALS — BP 130/60 | HR 84 | Resp 20 | Ht 65.5 in | Wt 190.6 lb

## 2017-03-25 DIAGNOSIS — Z0000 Encounter for general adult medical examination without abnormal findings: Secondary | ICD-10-CM

## 2017-03-25 DIAGNOSIS — N951 Menopausal and female climacteric states: Secondary | ICD-10-CM | POA: Diagnosis not present

## 2017-03-25 DIAGNOSIS — Z01411 Encounter for gynecological examination (general) (routine) with abnormal findings: Secondary | ICD-10-CM | POA: Diagnosis not present

## 2017-03-25 MED ORDER — CONJ ESTROG-MEDROXYPROGEST ACE 0.3-1.5 MG PO TABS: 1.0000 | ORAL_TABLET | Freq: Every day | ORAL | 3 refills | Status: DC

## 2017-03-25 NOTE — Progress Notes (Signed)
Prescription called into [id number], [place], [place]. E-script failed.

## 2017-03-25 NOTE — Patient Instructions (Signed)

## 2017-03-26 ENCOUNTER — Telehealth: Payer: Self-pay | Admitting: Nurse Practitioner

## 2017-03-26 LAB — TSH: TSH: 2.28 u[IU]/mL (ref 0.450–4.500)

## 2017-03-26 LAB — VITAMIN D 25 HYDROXY (VIT D DEFICIENCY, FRACTURES): Vit D, 25-Hydroxy: 36.2 ng/mL (ref 30.0–100.0)

## 2017-03-26 NOTE — Telephone Encounter (Signed)
Patient states her prescription for diuretic was not set to the pharmacy. She is using [facility] [address] in [place].

## 2017-03-27 NOTE — Telephone Encounter (Signed)
Spoke with patient, advised as seen below per Kem Boroughs, NP. Patient states she must have misunderstood, she thought this would be sent in by Kem Boroughs, NP. Patient states she sees Dr. Johnney Ou and she will not be back until next Saturday. RN asked if patient requested another provider while covering provider is out, patient states she did not and does not wish to see another provider. Recommended f/u with pcp. Patient states she will continue to monitor. Patient states she was seen by rheumatologist and advised a BMD wouldn't be ordered until age 6, would like to update Kem Boroughs, NP. Advised patient would update Kem Boroughs, NP and return call with any additional recommendations, patient is agreeable.  Kem Boroughs, NP -any additional recommendations?

## 2017-03-27 NOTE — Progress Notes (Signed)
Encounter reviewed. I worry slightly about her being on HRT with a h/o lupus (she also has mild thrombocytosis), she may be at an increased risk of blood clots. It's probably okay if she has had negative testing for antiphospholipid antibodies. Please check with her Rheumatologist that he feels she is safe to be on HRT Sumner Boast, MD

## 2017-03-27 NOTE — Telephone Encounter (Signed)
Patient called stating that the diuretic was not sent to her pharmacy, states she discussed this with you at visit. Please advise

## 2017-03-27 NOTE — Telephone Encounter (Signed)
She is followed by nephrologist for kidney disease and I told her she would need to call them for the order as this may be a contraindication.

## 2017-03-28 LAB — CYTOLOGY - PAP
Diagnosis: NEGATIVE
HPV: NOT DETECTED

## 2017-04-02 ENCOUNTER — Encounter: Payer: Self-pay | Admitting: *Deleted

## 2017-04-03 NOTE — Telephone Encounter (Signed)
Kristine Boroughs, NP -any additional recommendations? OK to close encounter?

## 2017-04-03 NOTE — Telephone Encounter (Signed)
Ok to close

## 2017-04-08 ENCOUNTER — Encounter: Payer: Self-pay | Admitting: General Surgery

## 2017-04-08 ENCOUNTER — Encounter: Payer: Self-pay | Admitting: *Deleted

## 2017-04-08 ENCOUNTER — Telehealth: Payer: Self-pay | Admitting: *Deleted

## 2017-04-08 ENCOUNTER — Ambulatory Visit (INDEPENDENT_AMBULATORY_CARE_PROVIDER_SITE_OTHER): Payer: PRIVATE HEALTH INSURANCE | Admitting: General Surgery

## 2017-04-08 VITALS — BP 130/70 | HR 70 | Resp 12 | Ht 66.0 in | Wt 190.0 lb

## 2017-04-08 DIAGNOSIS — Z86010 Personal history of colonic polyps: Secondary | ICD-10-CM | POA: Diagnosis not present

## 2017-04-08 MED ORDER — POLYETHYLENE GLYCOL 3350 17 GM/SCOOP PO POWD: ORAL | 0 refills | Status: DC

## 2017-04-08 NOTE — Progress Notes (Signed)
Patient ID: [name], female   DOB: Feb 05, 1956, 61 y.o.   MRN: [id number]  Chief Complaint  Patient presents with  . Colonoscopy    HPI DENESHA BROUSE is a 61 y.o. female Here today for a evaluation of a surveillance colonoscopy. Patient reports occasionally occurring post prandial abdominal fullness/discomfort in the Right abdominal region. Tums seemed to help with discomfort. Patient states no other GI problems at this time. Last colonoscopy was in 2012.  Moves her bowels daily.  HPI  Past Medical History:  Diagnosis Date  . Abnormal pap   . Anemia   . History of lupus 1987  . Hypertension   . Renal insufficiency 1988   kidney biopsy 1988 and 1998    Past Surgical History:  Procedure Laterality Date  . BREAST SURGERY  10/00   right breast benign fibroadenoma  . COLONOSCOPY  2012  . NASAL SEPTUM SURGERY  2007  . TUBAL LIGATION  1990    Family History  Problem Relation Age of Onset  . Cancer Father        oral cancer  . Cancer Maternal Grandmother        ovarian cancer  . Hypertension Maternal Grandmother   . Diabetes Maternal Grandfather   . Diabetes Paternal Aunt     Social History Social History  Substance Use Topics  . Smoking status: Never Smoker  . Smokeless tobacco: Never Used  . Alcohol use 0.6 - 1.2 oz/week    1 - 2 Standard drinks or equivalent per week    Allergies  Allergen Reactions  . Sulfa Antibiotics Itching and Rash    Current Outpatient Prescriptions  Medication Sig Dispense Refill  . ALPRAZolam (XANAX) 0.25 MG tablet Take 0.25 mg by mouth as needed.    Marland Kitchen aspirin 81 MG tablet Take 81 mg by mouth daily.    . diazepam (VALIUM) 5 MG tablet Take 5 mg by mouth every 6 (six) hours as needed for anxiety.    . enalapril (VASOTEC) 10 MG tablet Take 10 mg by mouth daily.    Marland Kitchen estrogen, conjugated,-medroxyprogesterone (PREMPRO) 0.3-1.5 MG tablet Take 1 tablet by mouth daily. 84 tablet 3  . hydroxychloroquine (PLAQUENIL) 200 MG tablet  Take 200 mg by mouth daily.     . Multiple Vitamins-Minerals (CENTRUM SILVER ULTRA WOMENS) TABS Take 1 tablet by mouth daily.    . traZODone (DESYREL) 50 MG tablet Take by mouth.     No current facility-administered medications for this visit.     Review of Systems Review of Systems  Constitutional: Negative.   Respiratory: Negative.   Cardiovascular: Negative.     Blood pressure 130/70, pulse 70, resp. rate 12, height 5\' 6"  (1.676 m), weight 190 lb (86.2 kg), last menstrual period 08/22/2008.  Physical Exam Physical Exam  Constitutional: She is oriented to person, place, and time. She appears well-developed and well-nourished.  Eyes: Conjunctivae are normal. No scleral icterus.  Neck: Neck supple.  Cardiovascular: Normal rate, regular rhythm and normal heart sounds.   Pulmonary/Chest: Effort normal and breath sounds normal.  Abdominal: Soft. Normal appearance and bowel sounds are normal. There is no hepatomegaly.  Lymphadenopathy:    She has no cervical adenopathy.  Neurological: She is alert and oriented to person, place, and time.  Skin: Skin is warm and dry.  Psychiatric: She has a normal mood and affect. Her behavior is normal.    Data Reviewed Prior notes reviewed.  Assessment   History of tubular adenoma in sigmoid  colon. Abdominal discomfort- has had similar symptoms in 2006 when she had an Endoscopy-no significant findings. [place] also was normal then. Diverticulosis- found in previous colonoscopy, patient has remained asymptomatic.    Plan   Due for surveillance colonoscopy. Also recommended EGD given her abdominal symptom. Pt is agreeable. Screening colonoscopy and EGD to be scheduled. Advised to call office with any questions or concerns.    Colonoscopy and Upper endoscopy with possible biopsy/polypectomy prn: Information regarding the procedure, including its potential risks and complications (including but not limited to perforation of the bowel, which may  require emergency surgery to repair, and bleeding) was verbally given to the patient. Educational information regarding lower intestinal endoscopy was given to the patient. Written instructions for how to complete the bowel prep using Miralax were provided. The importance of drinking ample fluids to avoid dehydration as a result of the prep emphasized.  HPI, Physical Exam, Assessment and Plan have been scribed under the direction and in the presence of Mckinley Jewel, MD  Gaspar Cola, CMA  I have completed the exam and reviewed the above documentation for accuracy and completeness.  I agree with the above.  Haematologist has been used and any errors in dictation or transcription are unintentional.  Seeplaputhur G. Jamal Collin, M.D., F.A.C.S.   Junie Panning G 04/08/2017, 1:31 PM

## 2017-04-08 NOTE — Telephone Encounter (Signed)
Patient contacted today and made aware that she has no anesthesia coverage for upper and lower endoscopy which is scheduled for 05-01-17.   This patient is aware that this must be administered by surgeon only.   Trish in endoscopy has already left for the day but I will contact her on Wednesday to make her aware.

## 2017-04-08 NOTE — Progress Notes (Signed)
Patient has been scheduled for a colonoscopy on 05-01-17 at [facility]. Miralax prescription has been sent in to the patient's pharmacy today. Colonoscopy instructions have been reviewed with the patient. This patient is aware to call the office if they have further questions.

## 2017-04-08 NOTE — Patient Instructions (Addendum)
Esophagogastroduodenoscopy Esophagogastroduodenoscopy (EGD) is a procedure to examine the lining of the esophagus, stomach, and first part of the small intestine (duodenum). This procedure is done to check for problems such as inflammation, bleeding, ulcers, or growths. During this procedure, a long, flexible, lighted tube with a camera attached (endoscope) is inserted down the throat. Tell a health care provider about:  Any allergies you have.  All medicines you are taking, including vitamins, herbs, eye drops, creams, and over-the-counter medicines.  Any problems you or family members have had with anesthetic medicines.  Any blood disorders you have.  Any surgeries you have had.  Any medical conditions you have.  Whether you are pregnant or may be pregnant. What are the risks? Generally, this is a safe procedure. However, problems may occur, including:  Infection.  Bleeding.  A tear (perforation) in the esophagus, stomach, or duodenum.  Trouble breathing.  Excessive sweating.  Spasms of the larynx.  A slowed heartbeat.  Low blood pressure.  What happens before the procedure?  Follow instructions from your health care provider about eating or drinking restrictions.  Ask your health care provider about: ? Changing or stopping your regular medicines. This is especially important if you are taking diabetes medicines or blood thinners. ? Taking medicines such as aspirin and ibuprofen. These medicines can thin your blood. Do not take these medicines before your procedure if your health care provider instructs you not to.  Plan to have someone take you home after the procedure.  If you wear dentures, be ready to remove them before the procedure. What happens during the procedure?  To reduce your risk of infection, your health care team will wash or sanitize their hands.  An IV tube will be put in a vein in your hand or arm. You will get medicines and fluids through  this tube.  You will be given one or more of the following: ? A medicine to help you relax (sedative). ? A medicine to numb the area (local anesthetic). This medicine may be sprayed into your throat. It will make you feel more comfortable and keep you from gagging or coughing during the procedure. ? A medicine for pain.  A mouth guard may be placed in your mouth to protect your teeth and to keep you from biting on the endoscope.  You will be asked to lie on your left side.  The endoscope will be lowered down your throat into your esophagus, stomach, and duodenum.  Air will be put into the endoscope. This will help your health care provider see better.  The lining of your esophagus, stomach, and duodenum will be examined.  Your health care provider may: ? Take a tissue sample so it can be looked at in a lab (biopsy). ? Remove growths. ? Remove objects (foreign bodies) that are stuck. ? Treat any bleeding with medicines or other devices that stop tissue from bleeding. ? Widen (dilate) or stretch narrowed areas of your esophagus and stomach.  The endoscope will be taken out. The procedure may vary among health care providers and hospitals. What happens after the procedure?  Your blood pressure, heart rate, breathing rate, and blood oxygen level will be monitored often until the medicines you were given have worn off.  Do not eat or drink anything until the numbing medicine has worn off and your gag reflex has returned. This information is not intended to replace advice given to you by your health care provider. Make sure you discuss any questions you  have with your health care provider. Document Released: 02/08/2005 Document Revised: 03/15/2016 Document Reviewed: 09/01/2015 Elsevier Interactive Patient Education  2018 [employer].   Colonoscopy, Adult A colonoscopy is an exam to look at the entire large intestine. During the exam, a lubricated, bendable tube is inserted into the  anus and then passed into the rectum, colon, and other parts of the large intestine. A colonoscopy is often done as a part of normal colorectal screening or in response to certain symptoms, such as anemia, persistent diarrhea, abdominal pain, and blood in the stool. The exam can help screen for and diagnose medical problems, including:  Tumors.  Polyps.  Inflammation.  Areas of bleeding.  Tell a health care provider about:  Any allergies you have.  All medicines you are taking, including vitamins, herbs, eye drops, creams, and over-the-counter medicines.  Any problems you or family members have had with anesthetic medicines.  Any blood disorders you have.  Any surgeries you have had.  Any medical conditions you have.  Any problems you have had passing stool. What are the risks? Generally, this is a safe procedure. However, problems may occur, including:  Bleeding.  A tear in the intestine.  A reaction to medicines given during the exam.  Infection (rare).  What happens before the procedure? Eating and drinking restrictions Follow instructions from your health care provider about eating and drinking, which may include:  A few days before the procedure - follow a low-fiber diet. Avoid nuts, seeds, dried fruit, raw fruits, and vegetables.  1-3 days before the procedure - follow a clear liquid diet. Drink only clear liquids, such as clear broth or bouillon, black coffee or tea, clear juice, clear soft drinks or sports drinks, gelatin dessert, and popsicles. Avoid any liquids that contain red or purple dye.  On the day of the procedure - do not eat or drink anything during the 2 hours before the procedure, or within the time period that your health care provider recommends.  Bowel prep If you were prescribed an oral bowel prep to clean out your colon:  Take it as told by your health care provider. Starting the day before your procedure, you will need to drink a large  amount of medicated liquid. The liquid will cause you to have multiple loose stools until your stool is almost clear or light green.  If your skin or anus gets irritated from diarrhea, you may use these to relieve the irritation: ? Medicated wipes, such as adult wet wipes with aloe and vitamin E. ? A skin soothing-product like petroleum jelly.  If you vomit while drinking the bowel prep, take a break for up to 60 minutes and then begin the bowel prep again. If vomiting continues and you cannot take the bowel prep without vomiting, call your health care provider.  General instructions  Ask your health care provider about changing or stopping your regular medicines. This is especially important if you are taking diabetes medicines or blood thinners.  Plan to have someone take you home from the hospital or clinic. What happens during the procedure?  An IV tube may be inserted into one of your veins.  You will be given medicine to help you relax (sedative).  To reduce your risk of infection: ? Your health care team will wash or sanitize their hands. ? Your anal area will be washed with soap.  You will be asked to lie on your side with your knees bent.  Your health care provider will  lubricate a long, thin, flexible tube. The tube will have a camera and a light on the end.  The tube will be inserted into your anus.  The tube will be gently eased through your rectum and colon.  Air will be delivered into your colon to keep it open. You may feel some pressure or cramping.  The camera will be used to take images during the procedure.  A small tissue sample may be removed from your body to be examined under a microscope (biopsy). If any potential problems are found, the tissue will be sent to a lab for testing.  If small polyps are found, your health care provider may remove them and have them checked for cancer cells.  The tube that was inserted into your anus will be slowly  removed. The procedure may vary among health care providers and hospitals. What happens after the procedure?  Your blood pressure, heart rate, breathing rate, and blood oxygen level will be monitored until the medicines you were given have worn off.  Do not drive for 24 hours after the exam.  You may have a small amount of blood in your stool.  You may pass gas and have mild abdominal cramping or bloating due to the air that was used to inflate your colon during the exam.  It is up to you to get the results of your procedure. Ask your health care provider, or the department performing the procedure, when your results will be ready. This information is not intended to replace advice given to you by your health care provider. Make sure you discuss any questions you have with your health care provider. Document Released: 10/05/2000 Document Revised: 08/08/2016 Document Reviewed: 12/20/2015 Elsevier Interactive Patient Education  2018 [employer].

## 2017-04-08 NOTE — Addendum Note (Signed)
Addended by: Christene Lye on: 04/08/2017 01:56 PM   Modules accepted: Orders

## 2017-04-09 ENCOUNTER — Encounter: Payer: Self-pay | Admitting: General Surgery

## 2017-04-10 NOTE — Telephone Encounter (Signed)
Trish in Endoscopy was notified that patient does not have anesthesia coverage. She will make a note in the computer and patient will need to be the last scheduled case in endoscopy that day due to this.

## 2017-05-01 ENCOUNTER — Ambulatory Visit
Admission: RE | Admit: 2017-05-01 | Discharge: 2017-05-01 | Disposition: A | Discharge status: Home / Self Care | Payer: 59 | Source: Ambulatory Visit | Attending: General Surgery | Admitting: General Surgery

## 2017-05-01 ENCOUNTER — Encounter: Payer: Self-pay | Admitting: *Deleted

## 2017-05-01 ENCOUNTER — Encounter: Payer: Self-pay | Admitting: Anesthesiology

## 2017-05-01 ENCOUNTER — Encounter
Admission: RE | Disposition: A | Discharge status: Home / Self Care | Payer: Self-pay | Source: Ambulatory Visit | Attending: General Surgery

## 2017-05-01 DIAGNOSIS — Z1211 Encounter for screening for malignant neoplasm of colon: Secondary | ICD-10-CM | POA: Insufficient documentation

## 2017-05-01 DIAGNOSIS — D127 Benign neoplasm of rectosigmoid junction: Secondary | ICD-10-CM | POA: Diagnosis not present

## 2017-05-01 DIAGNOSIS — Z86010 Personal history of colonic polyps: Secondary | ICD-10-CM | POA: Diagnosis not present

## 2017-05-01 DIAGNOSIS — K228 Other specified diseases of esophagus: Secondary | ICD-10-CM | POA: Diagnosis not present

## 2017-05-01 DIAGNOSIS — K2270 Barrett's esophagus without dysplasia: Secondary | ICD-10-CM | POA: Diagnosis not present

## 2017-05-01 DIAGNOSIS — K5730 Diverticulosis of large intestine without perforation or abscess without bleeding: Secondary | ICD-10-CM | POA: Diagnosis not present

## 2017-05-01 DIAGNOSIS — Z7982 Long term (current) use of aspirin: Secondary | ICD-10-CM | POA: Diagnosis not present

## 2017-05-01 DIAGNOSIS — M329 Systemic lupus erythematosus, unspecified: Secondary | ICD-10-CM | POA: Insufficient documentation

## 2017-05-01 DIAGNOSIS — Z79899 Other long term (current) drug therapy: Secondary | ICD-10-CM | POA: Diagnosis not present

## 2017-05-01 DIAGNOSIS — I10 Essential (primary) hypertension: Secondary | ICD-10-CM | POA: Insufficient documentation

## 2017-05-01 DIAGNOSIS — K30 Functional dyspepsia: Secondary | ICD-10-CM | POA: Diagnosis not present

## 2017-05-01 HISTORY — PX: COLONOSCOPY WITH PROPOFOL: SHX5780

## 2017-05-01 HISTORY — PX: ESOPHAGOGASTRODUODENOSCOPY (EGD) WITH PROPOFOL: SHX5813

## 2017-05-01 SURGERY — COLONOSCOPY WITH PROPOFOL
Anesthesia: Moderate Sedation

## 2017-05-01 MED ORDER — PROMETHAZINE HCL 25 MG/ML IJ SOLN
INTRAMUSCULAR | Status: AC
  Filled 2017-05-01: qty 1

## 2017-05-01 MED ORDER — FENTANYL CITRATE (PF) 100 MCG/2ML IJ SOLN
INTRAMUSCULAR | Status: DC
Start: 2017-05-01 — End: 2017-05-01
  Filled 2017-05-01: qty 4

## 2017-05-01 MED ORDER — SODIUM CHLORIDE 0.9 % IV SOLN
INTRAVENOUS | Status: DC
  Administered 2017-05-01 (×2): via INTRAVENOUS

## 2017-05-01 MED ORDER — PROMETHAZINE HCL 25 MG/ML IJ SOLN
INTRAMUSCULAR | Status: DC | PRN
  Administered 2017-05-01: 6.25 mg via INTRAVENOUS

## 2017-05-01 MED ORDER — FENTANYL CITRATE (PF) 100 MCG/2ML IJ SOLN
INTRAMUSCULAR | Status: DC | PRN
  Administered 2017-05-01: 50 ug via INTRAVENOUS
  Administered 2017-05-01: 100 ug via INTRAVENOUS
  Administered 2017-05-01 (×4): 50 ug via INTRAVENOUS

## 2017-05-01 MED ORDER — FENTANYL CITRATE (PF) 100 MCG/2ML IJ SOLN
INTRAMUSCULAR | Status: AC
  Filled 2017-05-01: qty 2

## 2017-05-01 MED ORDER — MIDAZOLAM HCL 5 MG/5ML IJ SOLN
INTRAMUSCULAR | Status: AC
  Filled 2017-05-01: qty 10

## 2017-05-01 MED ORDER — MIDAZOLAM HCL 5 MG/5ML IJ SOLN
INTRAMUSCULAR | Status: DC | PRN
  Administered 2017-05-01 (×5): 1 mg via INTRAVENOUS

## 2017-05-01 NOTE — Interval H&P Note (Signed)
History and Physical Interval Note:  05/01/2017 10:27 AM  [name]  has presented today for surgery, with the diagnosis of HX OF COLON POLYP RT ABDOMINAL PAIN  The various methods of treatment have been discussed with the patient and family. After consideration of risks, benefits and other options for treatment, the patient has consented to  Procedure(s): COLONOSCOPY WITH PROPOFOL (N/A) ESOPHAGOGASTRODUODENOSCOPY (EGD) WITH PROPOFOL (N/A) as a surgical intervention .  The patient's history has been reviewed, patient examined, no change in status, stable for surgery.  I have reviewed the patient's chart and labs.  Questions were answered to the patient's satisfaction.     SANKAR,SEEPLAPUTHUR G

## 2017-05-01 NOTE — Op Note (Signed)
[facility] Patient Name: [name] Procedure Date: 05/01/2017 10:24 AM MRN: [id number] Account #: [id number] Date of Birth: [birth year] Admit Type: Outpatient Age: 61 Room: [facility] ENDO ROOM 1 Gender: Female Note Status: Finalized Procedure:            Colonoscopy Indications:          High risk colon cancer surveillance: Personal history                        of colonic polyps, High risk colon cancer surveillance:                        Personal history of adenoma less than 10 mm in size Providers:            Seeplaputhur G. Jamal Collin, MD Referring MD:         Kem Boroughs (Referring MD) Medicines:            Fentanyl 350 micrograms IV, Midazolam 5 mg IV,                        Promethazine 8.67 mg IV Complications:        No immediate complications. Procedure:            Pre-Anesthesia Assessment:                       - The anesthesia plan was to use moderate                        sedation/analgesia (conscious sedation).                       - Immediately prior to administration of medications,                        the patient was re-assessed for adequacy to receive                        sedatives.                       - Prior to the procedure, a History and Physical was                        performed, and patient medications and allergies were                        reviewed. The patient is competent. The risks and                        benefits of the procedure and the sedation options and                        risks were discussed with the patient. All questions                        were answered and informed consent was obtained.                        Patient identification and proposed procedure were  verified by the physician in the pre-procedure area.                        Mental Status Examination: sedated. Airway Examination:                        normal oropharyngeal airway and neck mobility.                         Respiratory Examination: clear to auscultation. CV                        Examination: normal. ASA Grade Assessment: I - A                        normal, healthy patient. After reviewing the risks and                        benefits, the patient was deemed in satisfactory                        condition to undergo the procedure. The anesthesia plan                        was to use moderate sedation / analgesia (conscious                        sedation). Immediately prior to administration of                        medications, the patient was re-assessed for adequacy                        to receive sedatives. The heart rate, respiratory rate,                        oxygen saturations, blood pressure, adequacy of                        pulmonary ventilation, and response to care were                        monitored throughout the procedure. The physical status                        of the patient was re-assessed after the procedure.                       After obtaining informed consent, the colonoscope was                        passed under direct vision. Throughout the procedure,                        the patient's blood pressure, pulse, and oxygen                        saturations were monitored continuously. The  Colonoscope was introduced through the anus and                        advanced to the the cecum, identified by the ileocecal                        valve. The colonoscopy was somewhat difficult due to                        the patient's discomfort during the procedure. The                        quality of the bowel preparation was good. Findings:      The perianal and digital rectal examinations were normal.      Multiple small and large-mouthed diverticula were found in the sigmoid       colon, descending colon and transverse colon.      A 5 mm polyp was found in the recto-sigmoid colon. The polyp was       sessile.  The polyp was removed with a hot snare. Resection and retrieval       were complete.      The exam was otherwise without abnormality. Impression:           - Diverticulosis in the sigmoid colon, in the                        descending colon and in the transverse colon.                       - One 5 mm polyp at the recto-sigmoid colon, removed                        with a hot snare. Resected and retrieved.                       - The examination was otherwise normal. Recommendation:       - Discharge patient to home.                       - Resume previous diet.                       - Continue present medications.                       - Await pathology results.                       - Repeat colonoscopy in 5 years for surveillance. Procedure Code(s):    --- Professional ---                       [id number], Colonoscopy, flexible; with removal of tumor(s),                        polyp(s), or other lesion(s) by snare technique Diagnosis Code(s):    --- Professional ---                       D12.7, Benign neoplasm of rectosigmoid junction  Z86.010, Personal history of colonic polyps                       K57.30, Diverticulosis of large intestine without                        perforation or abscess without bleeding CPT copyright 2016 American [employer] Association. All rights reserved. The codes documented in this report are preliminary and upon coder review may  be revised to meet current compliance requirements. Christene Lye, MD 05/01/2017 11:54:44 AM This report has been signed electronically. Number of Addenda: 0 Note Initiated On: 05/01/2017 10:24 AM Scope Withdrawal Time: 0 hours 6 minutes 52 seconds  Total Procedure Duration: 0 hours 45 minutes 52 seconds       [facility]

## 2017-05-01 NOTE — Op Note (Signed)
[facility] Gastroenterology Patient Name: [name] Procedure Date: 05/01/2017 10:24 AM MRN: [id number] Account #: [id number] Date of Birth: Mar 01, 1956 Admit Type: Outpatient Age: 61 Room: [facility] ENDO ROOM 1 Gender: Female Note Status: Finalized Procedure:            Upper GI endoscopy Indications:          Functional Dyspepsia Providers:            Seeplaputhur G. Jamal Collin, MD Referring MD:         Kem Boroughs (Referring MD) Medicines:            Fentanyl 150 micrograms IV, Midazolam 2 mg IV,                        Promethazine 0.17 mg IV Complications:        No immediate complications. Procedure:            Pre-Anesthesia Assessment:                       - Prior to the procedure, a History and Physical was                        performed, and patient medications and allergies were                        reviewed. The patient is competent. The risks and                        benefits of the procedure and the sedation options and                        risks were discussed with the patient. All questions                        were answered and informed consent was obtained.                        Patient identification and proposed procedure were                        verified by the physician in the pre-procedure area.                        Mental Status Examination: alert and oriented. Airway                        Examination: normal oropharyngeal airway and neck                        mobility. Respiratory Examination: clear to                        auscultation. CV Examination: RRR, no murmurs, no S3 or                        S4. ASA Grade Assessment: I - A normal, healthy                        patient. After reviewing the risks and benefits, the  patient was deemed in satisfactory condition to undergo                        the procedure. The anesthesia plan was to use moderate                        sedation / analgesia  (conscious sedation). Immediately                        prior to administration of medications, the patient was                        re-assessed for adequacy to receive sedatives. The                        heart rate, respiratory rate, oxygen saturations, blood                        pressure, adequacy of pulmonary ventilation, and                        response to care were monitored throughout the                        procedure. The physical status of the patient was                        re-assessed after the procedure.                       After obtaining informed consent, the endoscope was                        passed under direct vision. Throughout the procedure,                        the patient's blood pressure, pulse, and oxygen                        saturations were monitored continuously. The Endoscope                        was introduced through the mouth, and advanced to the                        second part of duodenum. The upper GI endoscopy was                        accomplished without difficulty. The patient tolerated                        the procedure well. Findings:      The examined duodenum was normal.      The stomach was normal.      There were esophageal mucosal changes suspicious for short-segment       Barrett's esophagus present at the gastroesophageal junction. The       maximum longitudinal extent of these mucosal changes was 1 cm in length.       This was biopsied with a cold forceps for histology.  The exam was otherwise without abnormality. Impression:           - Normal examined duodenum.                       - Normal stomach.                       - Esophageal mucosal changes suspicious for                        short-segment Barrett's esophagus. Biopsied.                       - The examination was otherwise normal. Recommendation:       - Await pathology results. Procedure Code(s):    --- Professional ---                        [phone], Esophagogastroduodenoscopy, flexible, transoral;                        with biopsy, single or multiple Diagnosis Code(s):    --- Professional ---                       K22.8, Other specified diseases of esophagus                       K30, Functional dyspepsia CPT copyright 2016 American Medical Association. All rights reserved. The codes documented in this report are preliminary and upon coder review may  be revised to meet current compliance requirements. Christene Lye, MD 05/01/2017 10:57:55 AM This report has been signed electronically. Number of Addenda: 0 Note Initiated On: 05/01/2017 10:24 AM      [facility]

## 2017-05-01 NOTE — H&P (View-Only) (Signed)
Patient ID: [name], female   DOB: 1956/05/07, 61 y.o.   MRN: [id number]  Chief Complaint  Patient presents with  . Colonoscopy    HPI [name] is a 61 y.o. female Here today for a evaluation of a surveillance colonoscopy. Patient reports occasionally occurring post prandial abdominal fullness/discomfort in the Right abdominal region. Tums seemed to help with discomfort. Patient states no other GI problems at this time. Last colonoscopy was in 2012.  Moves her bowels daily.  HPI  Past Medical History:  Diagnosis Date  . Abnormal pap   . Anemia   . History of lupus 1987  . Hypertension   . Renal insufficiency 1988   kidney biopsy 1988 and 1998    Past Surgical History:  Procedure Laterality Date  . BREAST SURGERY  10/00   right breast benign fibroadenoma  . COLONOSCOPY  2012  . NASAL SEPTUM SURGERY  2007  . TUBAL LIGATION  1990    Family History  Problem Relation Age of Onset  . Cancer Father        oral cancer  . Cancer Maternal Grandmother        ovarian cancer  . Hypertension Maternal Grandmother   . Diabetes Maternal Grandfather   . Diabetes Paternal Aunt     Social History Social History  Substance Use Topics  . Smoking status: Never Smoker  . Smokeless tobacco: Never Used  . Alcohol use 0.6 - 1.2 oz/week    1 - 2 Standard drinks or equivalent per week    Allergies  Allergen Reactions  . Sulfa Antibiotics Itching and Rash    Current Outpatient Prescriptions  Medication Sig Dispense Refill  . ALPRAZolam (XANAX) 0.25 MG tablet Take 0.25 mg by mouth as needed.    Marland Kitchen aspirin 81 MG tablet Take 81 mg by mouth daily.    . diazepam (VALIUM) 5 MG tablet Take 5 mg by mouth every 6 (six) hours as needed for anxiety.    . enalapril (VASOTEC) 10 MG tablet Take 10 mg by mouth daily.    Marland Kitchen estrogen, conjugated,-medroxyprogesterone (PREMPRO) 0.3-1.5 MG tablet Take 1 tablet by mouth daily. 84 tablet 3  . hydroxychloroquine (PLAQUENIL) 200 MG tablet  Take 200 mg by mouth daily.     . Multiple Vitamins-Minerals (CENTRUM SILVER ULTRA WOMENS) TABS Take 1 tablet by mouth daily.    . traZODone (DESYREL) 50 MG tablet Take by mouth.     No current facility-administered medications for this visit.     Review of Systems Review of Systems  Constitutional: Negative.   Respiratory: Negative.   Cardiovascular: Negative.     Blood pressure 130/70, pulse 70, resp. rate 12, height 5\' 6"  (1.676 m), weight 190 lb (86.2 kg), last menstrual period 08/22/2008.  Physical Exam Physical Exam  Constitutional: She is oriented to person, place, and time. She appears well-developed and well-nourished.  Eyes: Conjunctivae are normal. No scleral icterus.  Neck: Neck supple.  Cardiovascular: Normal rate, regular rhythm and normal heart sounds.   Pulmonary/Chest: Effort normal and breath sounds normal.  Abdominal: Soft. Normal appearance and bowel sounds are normal. There is no hepatomegaly.  Lymphadenopathy:    She has no cervical adenopathy.  Neurological: She is alert and oriented to person, place, and time.  Skin: Skin is warm and dry.  Psychiatric: She has a normal mood and affect. Her behavior is normal.    Data Reviewed Prior notes reviewed.  Assessment   History of tubular adenoma in sigmoid  colon. Abdominal discomfort- has had similar symptoms in 2006 when she had an Endoscopy-no significant findings. [place] also was normal then. Diverticulosis- found in previous colonoscopy, patient has remained asymptomatic.    Plan   Due for surveillance colonoscopy. Also recommended EGD given her abdominal symptom. Pt is agreeable. Screening colonoscopy and EGD to be scheduled. Advised to call office with any questions or concerns.    Colonoscopy and Upper endoscopy with possible biopsy/polypectomy prn: Information regarding the procedure, including its potential risks and complications (including but not limited to perforation of the bowel, which may  require emergency surgery to repair, and bleeding) was verbally given to the patient. Educational information regarding lower intestinal endoscopy was given to the patient. Written instructions for how to complete the bowel prep using Miralax were provided. The importance of drinking ample fluids to avoid dehydration as a result of the prep emphasized.  HPI, Physical Exam, Assessment and Plan have been scribed under the direction and in the presence of Mckinley Jewel, MD  Gaspar Cola, CMA  I have completed the exam and reviewed the above documentation for accuracy and completeness.  I agree with the above.  Haematologist has been used and any errors in dictation or transcription are unintentional.  Seeplaputhur G. Jamal Collin, M.D., F.A.C.S.   Junie Panning G 04/08/2017, 1:31 PM

## 2017-05-02 ENCOUNTER — Encounter: Payer: Self-pay | Admitting: General Surgery

## 2017-05-02 LAB — SURGICAL PATHOLOGY

## 2017-05-08 ENCOUNTER — Other Ambulatory Visit: Payer: Self-pay | Admitting: *Deleted

## 2017-05-08 DIAGNOSIS — R1011 Right upper quadrant pain: Secondary | ICD-10-CM

## 2017-05-08 DIAGNOSIS — R1012 Left upper quadrant pain: Principal | ICD-10-CM

## 2017-05-14 ENCOUNTER — Ambulatory Visit
Admission: RE | Admit: 2017-05-14 | Discharge: 2017-05-14 | Disposition: A | Discharge status: Home / Self Care | Payer: PRIVATE HEALTH INSURANCE | Source: Ambulatory Visit | Attending: General Surgery | Admitting: General Surgery

## 2017-05-14 DIAGNOSIS — R1011 Right upper quadrant pain: Secondary | ICD-10-CM | POA: Diagnosis present

## 2017-05-14 DIAGNOSIS — R1012 Left upper quadrant pain: Secondary | ICD-10-CM | POA: Diagnosis present

## 2017-05-14 DIAGNOSIS — K824 Cholesterolosis of gallbladder: Secondary | ICD-10-CM | POA: Insufficient documentation

## 2017-05-15 ENCOUNTER — Ambulatory Visit (INDEPENDENT_AMBULATORY_CARE_PROVIDER_SITE_OTHER): Payer: PRIVATE HEALTH INSURANCE | Admitting: General Surgery

## 2017-05-15 ENCOUNTER — Encounter: Payer: Self-pay | Admitting: General Surgery

## 2017-05-15 VITALS — BP 128/68 | HR 60 | Resp 12 | Ht 66.0 in | Wt 187.0 lb

## 2017-05-15 DIAGNOSIS — R1011 Right upper quadrant pain: Secondary | ICD-10-CM

## 2017-05-15 NOTE — Progress Notes (Signed)
Patient ID: [name], female   DOB: 1956-06-13, 61 y.o.   MRN: [id number]  Chief Complaint  Patient presents with  . Follow-up    u/s    HPI [name] is a 61 y.o. female.  Here for follow up after endoscopy, colonoscopy, and gall bladder ultrasound.  HPI  Past Medical History:  Diagnosis Date  . Abnormal pap   . Anemia   . History of lupus 1987  . Hypertension   . Renal insufficiency 1988   kidney biopsy 1988 and 1998    Past Surgical History:  Procedure Laterality Date  . BREAST SURGERY  10/00   right breast benign fibroadenoma  . COLONOSCOPY  2012  . COLONOSCOPY WITH PROPOFOL N/A 05/01/2017   Procedure: COLONOSCOPY WITH PROPOFOL;  Surgeon: Christene Lye, MD;  Location: [facility] ENDOSCOPY;  Service: Endoscopy;  Laterality: N/A;  . ESOPHAGOGASTRODUODENOSCOPY (EGD) WITH PROPOFOL N/A 05/01/2017   Procedure: ESOPHAGOGASTRODUODENOSCOPY (EGD) WITH PROPOFOL;  Surgeon: Christene Lye, MD;  Location: [facility] ENDOSCOPY;  Service: Endoscopy;  Laterality: N/A;  . NASAL SEPTUM SURGERY  2007  . TUBAL LIGATION  1990    Family History  Problem Relation Age of Onset  . Cancer Father        oral cancer  . Cancer Maternal Grandmother        ovarian cancer  . Hypertension Maternal Grandmother   . Diabetes Maternal Grandfather   . Diabetes Paternal Aunt     Social History Social History  Substance Use Topics  . Smoking status: Never Smoker  . Smokeless tobacco: Never Used  . Alcohol use 0.6 - 1.2 oz/week    1 - 2 Standard drinks or equivalent per week    Allergies  Allergen Reactions  . Sulfa Antibiotics Itching and Rash    Current Outpatient Prescriptions  Medication Sig Dispense Refill  . ALPRAZolam (XANAX) 0.25 MG tablet Take 0.25 mg by mouth as needed.    Marland Kitchen aspirin 81 MG tablet Take 81 mg by mouth daily.    . diazepam (VALIUM) 5 MG tablet Take 5 mg by mouth every 6 (six) hours as needed for anxiety.    . enalapril (VASOTEC) 10 MG tablet Take  10 mg by mouth daily.    Marland Kitchen estrogen, conjugated,-medroxyprogesterone (PREMPRO) 0.3-1.5 MG tablet Take 1 tablet by mouth daily. 84 tablet 3  . hydroxychloroquine (PLAQUENIL) 200 MG tablet Take 200 mg by mouth daily.     . Multiple Vitamins-Minerals (CENTRUM SILVER ULTRA WOMENS) TABS Take 1 tablet by mouth daily.    . polyethylene glycol powder (GLYCOLAX/MIRALAX) powder 255 grams one bottle for colonoscopy prep 255 g 0  . traZODone (DESYREL) 50 MG tablet Take by mouth.     No current facility-administered medications for this visit.     Review of Systems Review of Systems  Constitutional: Negative.   Respiratory: Negative.   Cardiovascular: Negative.     Blood pressure 128/68, pulse 60, resp. rate 12, height 5\' 6"  (1.676 m), weight 187 lb (84.8 kg), last menstrual period 08/22/2008.  Physical Exam Physical Exam  Constitutional: She is oriented to person, place, and time. She appears well-developed and well-nourished.  Neurological: She is alert and oriented to person, place, and time.  Skin: Skin is warm and dry.  Psychiatric: Her behavior is normal.    Data Reviewed Endoscopy and [place] report   Assessment   Questionable Barrett's Esophagus - findings on endoscopy were minimal, this would not explain her symptom of intermittent right sided abdominal  pain.   Gall bladder polyps - remote possibility that some of these are stones, not able to distinguish on [place]. May explain her abdominal pain  Colon polyps - benign     Plan    Follow up in 6 weeks  Pt instructed to keep a symptom diary and bring to next appt  Start taking Prilosec 20 mg daily for endoscopic findings     HPI, Physical Exam, Assessment and Plan have been scribed under the direction and in the presence of Mckinley Jewel, MD Karie Fetch, RN  I have completed the exam and reviewed the above documentation for accuracy and completeness.  I agree with the above.  Haematologist has been used and any errors in  dictation or transcription are unintentional.  Anah Billard G. Jamal Collin, M.D., F.A.C.S.  Junie Panning G 05/17/2017, 12:51 PM

## 2017-05-15 NOTE — Patient Instructions (Addendum)
Follow up in 6 weeks Keep a symptom diary and bring to next appointment  Start taking Prilosec once per day   Avoid caffeine, acidic beverages such as soda. This may improve your symptoms

## 2017-05-22 ENCOUNTER — Other Ambulatory Visit: Payer: Self-pay | Admitting: Nurse Practitioner

## 2017-05-22 DIAGNOSIS — Z1231 Encounter for screening mammogram for malignant neoplasm of breast: Secondary | ICD-10-CM

## 2017-05-28 ENCOUNTER — Telehealth: Payer: Self-pay | Admitting: Obstetrics & Gynecology

## 2017-05-28 NOTE — Telephone Encounter (Signed)
LMTCB/:NP/ .CX/LETTER SENT/RD

## 2017-06-27 ENCOUNTER — Ambulatory Visit
Admission: RE | Admit: 2017-06-27 | Discharge: 2017-06-27 | Disposition: A | Discharge status: Home / Self Care | Payer: PRIVATE HEALTH INSURANCE | Source: Ambulatory Visit | Attending: Nurse Practitioner | Admitting: Nurse Practitioner

## 2017-06-27 ENCOUNTER — Encounter: Payer: Self-pay | Admitting: General Surgery

## 2017-06-27 ENCOUNTER — Ambulatory Visit (INDEPENDENT_AMBULATORY_CARE_PROVIDER_SITE_OTHER): Payer: PRIVATE HEALTH INSURANCE | Admitting: General Surgery

## 2017-06-27 VITALS — BP 132/74 | HR 80 | Resp 12 | Ht 67.0 in | Wt 187.0 lb

## 2017-06-27 DIAGNOSIS — R1011 Right upper quadrant pain: Secondary | ICD-10-CM

## 2017-06-27 DIAGNOSIS — Z86010 Personal history of colonic polyps: Secondary | ICD-10-CM

## 2017-06-27 DIAGNOSIS — Z1231 Encounter for screening mammogram for malignant neoplasm of breast: Secondary | ICD-10-CM

## 2017-06-27 DIAGNOSIS — K824 Cholesterolosis of gallbladder: Secondary | ICD-10-CM | POA: Diagnosis not present

## 2017-06-27 NOTE — Progress Notes (Signed)
Patient ID: [name], female   DOB: [birth year], 61 y.o.   MRN: [id number]  Chief Complaint  Patient presents with  . Follow-up    HPI [name] is a 61 y.o. female here today for a recheck abdomen pain. Patient states the pain has been better. She states she only had about three episodes since her last visit, but the pain was on left side of abdomen. No other GI symptoms HPI  Past Medical History:  Diagnosis Date  . Abnormal pap   . Anemia   . History of lupus 1987  . Hypertension   . Renal insufficiency 1988   kidney biopsy 1988 and 1998    Past Surgical History:  Procedure Laterality Date  . BREAST SURGERY  10/00   right breast benign fibroadenoma  . COLONOSCOPY  2012  . COLONOSCOPY WITH PROPOFOL N/A 05/01/2017   Procedure: COLONOSCOPY WITH PROPOFOL;  Surgeon: Christene Lye, MD;  Location: [facility] ENDOSCOPY;  Service: Endoscopy;  Laterality: N/A;  . ESOPHAGOGASTRODUODENOSCOPY (EGD) WITH PROPOFOL N/A 05/01/2017   Procedure: ESOPHAGOGASTRODUODENOSCOPY (EGD) WITH PROPOFOL;  Surgeon: Christene Lye, MD;  Location: [facility] ENDOSCOPY;  Service: Endoscopy;  Laterality: N/A;  . NASAL SEPTUM SURGERY  2007  . TUBAL LIGATION  1990    Family History  Problem Relation Age of Onset  . Cancer Father        oral cancer  . Cancer Maternal Grandmother        ovarian cancer  . Hypertension Maternal Grandmother   . Diabetes Maternal Grandfather   . Diabetes Paternal Aunt     Social History Social History  Substance Use Topics  . Smoking status: Never Smoker  . Smokeless tobacco: Never Used  . Alcohol use 0.6 - 1.2 oz/week    1 - 2 Standard drinks or equivalent per week    Allergies  Allergen Reactions  . Sulfa Antibiotics Itching and Rash    Current Outpatient Prescriptions  Medication Sig Dispense Refill  . ALPRAZolam (XANAX) 0.25 MG tablet Take 0.25 mg by mouth as needed.    Marland Kitchen aspirin 81 MG tablet Take 81 mg by mouth daily.    . diazepam  (VALIUM) 5 MG tablet Take 5 mg by mouth every 6 (six) hours as needed for anxiety.    . enalapril (VASOTEC) 10 MG tablet Take 10 mg by mouth daily.    Marland Kitchen estrogen, conjugated,-medroxyprogesterone (PREMPRO) 0.3-1.5 MG tablet Take 1 tablet by mouth daily. 84 tablet 3  . hydroxychloroquine (PLAQUENIL) 200 MG tablet Take 200 mg by mouth daily.     . Multiple Vitamins-Minerals (CENTRUM SILVER ULTRA WOMENS) TABS Take 1 tablet by mouth daily.    . polyethylene glycol powder (GLYCOLAX/MIRALAX) powder 255 grams one bottle for colonoscopy prep 255 g 0  . traZODone (DESYREL) 50 MG tablet Take by mouth.     No current facility-administered medications for this visit.     Review of Systems Review of Systems  Constitutional: Negative.   Respiratory: Negative.   Cardiovascular: Negative.     Blood pressure 132/74, pulse 80, resp. rate 12, height 5\' 7"  (1.702 m), weight 187 lb (84.8 kg), last menstrual period 08/22/2008.  Physical Exam Physical Exam  Constitutional: She is oriented to person, place, and time. She appears well-developed and well-nourished.  Eyes: Conjunctivae are normal. No scleral icterus.  Neck: Neck supple.  Cardiovascular: Regular rhythm.   Pulmonary/Chest: Effort normal and breath sounds normal.  Abdominal: Soft. Bowel sounds are normal. There is no tenderness.  Lymphadenopathy:    She has no cervical adenopathy.  Neurological: She is alert and oriented to person, place, and time.  Skin: Skin is warm and dry.    Data Reviewed Prior notes, labs and xrays  reviewed   Assessment    Abdominal pain which does not seem to correspond with the biliary source. Gallbladder polyps seen on ultrasound.  endoscopy revealed mild encroachment of gastric mucosa at the [place] junction-study was otherwise normal History of colon polyp. Colonoscopy due in 5 years Plan     Start taking Prilosec 20 mg daily for endoscopic findings. Return in one year with follow up ultrasound of  gallbladder.  The patient is aware to call back for any questions or concerns.  HPI, Physical Exam, Assessment and Plan have been scribed under the direction and in the presence of Mckinley Jewel, MD  Gaspar Cola, CMA     I have completed the exam and reviewed the above documentation for accuracy and completeness.  I agree with the above.  Haematologist has been used and any errors in dictation or transcription are unintentional.  Markis Langland G. Jamal Collin, M.D., F.A.C.S.    Junie Panning G 07/01/2017, 10:54 AM

## 2017-06-27 NOTE — Patient Instructions (Addendum)
  Start taking Prilosec 20 mg daily for endoscopic findings. Return in one year ultrasound   The patient is aware to call back for any questions or concerns.

## 2017-07-02 ENCOUNTER — Other Ambulatory Visit: Payer: Self-pay | Admitting: Nurse Practitioner

## 2017-07-02 DIAGNOSIS — R928 Other abnormal and inconclusive findings on diagnostic imaging of breast: Secondary | ICD-10-CM

## 2017-07-04 ENCOUNTER — Other Ambulatory Visit: Payer: Self-pay | Admitting: Obstetrics and Gynecology

## 2017-07-04 DIAGNOSIS — R928 Other abnormal and inconclusive findings on diagnostic imaging of breast: Secondary | ICD-10-CM

## 2017-07-08 ENCOUNTER — Ambulatory Visit
Admission: RE | Admit: 2017-07-08 | Discharge: 2017-07-08 | Disposition: A | Discharge status: Home / Self Care | Payer: PRIVATE HEALTH INSURANCE | Source: Ambulatory Visit | Attending: Nurse Practitioner | Admitting: Nurse Practitioner

## 2017-07-08 ENCOUNTER — Other Ambulatory Visit: Payer: Self-pay | Admitting: Obstetrics and Gynecology

## 2017-07-08 ENCOUNTER — Ambulatory Visit
Admission: RE | Admit: 2017-07-08 | Discharge: 2017-07-08 | Disposition: A | Discharge status: Home / Self Care | Payer: PRIVATE HEALTH INSURANCE | Source: Ambulatory Visit | Attending: Obstetrics and Gynecology | Admitting: Obstetrics and Gynecology

## 2017-07-08 DIAGNOSIS — N6310 Unspecified lump in the right breast, unspecified quadrant: Secondary | ICD-10-CM

## 2017-07-08 DIAGNOSIS — R928 Other abnormal and inconclusive findings on diagnostic imaging of breast: Secondary | ICD-10-CM

## 2017-07-08 HISTORY — PX: BREAST BIOPSY: SHX20

## 2017-07-19 ENCOUNTER — Ambulatory Visit: Payer: Self-pay | Admitting: General Surgery

## 2017-07-19 DIAGNOSIS — D241 Benign neoplasm of right breast: Secondary | ICD-10-CM

## 2017-07-23 ENCOUNTER — Other Ambulatory Visit: Payer: Self-pay | Admitting: General Surgery

## 2017-07-23 DIAGNOSIS — D241 Benign neoplasm of right breast: Secondary | ICD-10-CM

## 2017-07-26 ENCOUNTER — Encounter: Payer: Self-pay | Admitting: Hematology and Oncology

## 2017-07-30 ENCOUNTER — Encounter (HOSPITAL_BASED_OUTPATIENT_CLINIC_OR_DEPARTMENT_OTHER): Payer: Self-pay | Admitting: *Deleted

## 2017-08-05 ENCOUNTER — Ambulatory Visit
Admission: RE | Admit: 2017-08-05 | Discharge: 2017-08-05 | Disposition: A | Discharge status: Home / Self Care | Payer: PRIVATE HEALTH INSURANCE | Source: Ambulatory Visit | Attending: General Surgery | Admitting: General Surgery

## 2017-08-05 ENCOUNTER — Encounter (HOSPITAL_BASED_OUTPATIENT_CLINIC_OR_DEPARTMENT_OTHER)
Admission: RE | Admit: 2017-08-05 | Discharge: 2017-08-05 | Disposition: A | Discharge status: Home / Self Care | Payer: 59 | Source: Ambulatory Visit | Attending: General Surgery | Admitting: General Surgery

## 2017-08-05 DIAGNOSIS — Z87891 Personal history of nicotine dependence: Secondary | ICD-10-CM | POA: Diagnosis not present

## 2017-08-05 DIAGNOSIS — F419 Anxiety disorder, unspecified: Secondary | ICD-10-CM | POA: Diagnosis not present

## 2017-08-05 DIAGNOSIS — N6021 Fibroadenosis of right breast: Secondary | ICD-10-CM | POA: Diagnosis not present

## 2017-08-05 DIAGNOSIS — Z79899 Other long term (current) drug therapy: Secondary | ICD-10-CM | POA: Diagnosis not present

## 2017-08-05 DIAGNOSIS — F329 Major depressive disorder, single episode, unspecified: Secondary | ICD-10-CM | POA: Diagnosis not present

## 2017-08-05 DIAGNOSIS — D241 Benign neoplasm of right breast: Secondary | ICD-10-CM | POA: Diagnosis not present

## 2017-08-05 DIAGNOSIS — I10 Essential (primary) hypertension: Secondary | ICD-10-CM | POA: Diagnosis not present

## 2017-08-05 LAB — BASIC METABOLIC PANEL
Anion gap: 5 (ref 5–15)
BUN: 18 mg/dL (ref 6–20)
CO2: 27 mmol/L (ref 22–32)
Calcium: 9.1 mg/dL (ref 8.9–10.3)
Chloride: 106 mmol/L (ref 101–111)
Creatinine, Ser: 1.17 mg/dL — ABNORMAL HIGH (ref 0.44–1.00)
GFR, EST AFRICAN AMERICAN: 57 mL/min — AB (ref >60)
GFR, EST NON AFRICAN AMERICAN: 49 mL/min — AB (ref >60)
GLUCOSE: 100 mg/dL — AB (ref 65–99)
Potassium: 4.3 mmol/L (ref 3.5–5.1)
Sodium: 138 mmol/L (ref 135–145)

## 2017-08-05 NOTE — Progress Notes (Signed)
EKG reviewed by Dr. Eligha Bridegroom, will proceed with surgery as scheduled, Ensure pre surgery drink given with instructions to complete by [place], pt verbalized understanding.

## 2017-08-05 NOTE — Anesthesia Preprocedure Evaluation (Addendum)
Anesthesia Evaluation  Patient identified by MRN, date of birth, ID band Patient awake    Reviewed: Allergy & Precautions, NPO status , Patient's Chart, lab work & pertinent test results  History of Anesthesia Complications Negative for: history of anesthetic complications  Airway Mallampati: II  TM Distance: >3 FB Neck ROM: Full    Dental no notable dental hx. (+) Dental Advisory Given   Pulmonary neg pulmonary ROS,    Pulmonary exam normal        Cardiovascular hypertension, Pt. on medications Normal cardiovascular exam     Neuro/Psych PSYCHIATRIC DISORDERS Anxiety Depression negative neurological ROS     GI/Hepatic negative GI ROS, Neg liver ROS,   Endo/Other  negative endocrine ROS  Renal/GU Renal InsufficiencyRenal disease  negative genitourinary   Musculoskeletal negative musculoskeletal ROS (+)   Abdominal   Peds negative pediatric ROS (+)  Hematology negative hematology ROS (+)   Anesthesia Other Findings Day of surgery medications reviewed with the patient.  Reproductive/Obstetrics negative OB ROS                            Anesthesia Physical Anesthesia Plan  ASA: III  Anesthesia Plan: General   Post-op Pain Management:    Induction: Intravenous  PONV Risk Score and Plan: 4 or greater and Ondansetron, Dexamethasone and Scopolamine patch - Pre-op  Airway Management Planned: LMA  Additional Equipment:   Intra-op Plan:   Post-operative Plan: Extubation in OR  Informed Consent: I have reviewed the patients History and Physical, chart, labs and discussed the procedure including the risks, benefits and alternatives for the proposed anesthesia with the patient or authorized representative who has indicated his/her understanding and acceptance.   Dental advisory given  Plan Discussed with: CRNA, Anesthesiologist and Surgeon  Anesthesia Plan Comments:         Anesthesia Quick Evaluation

## 2017-08-05 NOTE — H&P (Signed)
History of Present Illness Marland Kitchen T. Cambre Matson MD; 07/19/2017 4:59 PM) The patient is a 61 year old female who presents with a breast mass. Patient is a 61 year old female referred by Dr. Jetta Lout for recent abnormal mammogram and diagnosis of papilloma. The patient reports a remote history of a benign fibroadenoma being removed about the year 2000. No other breast history. She has no family history of breast cancer. Recent screening mammogram revealed a possible new breast mass and she was called back for further imaging. Diagnostic mammogram showed a partially circumscribed mass in the outer periareolar right breast and ultrasound showed a 0.9 cm mass in the corresponding area. This was 10 o'clock position right periareolar breast. Ultrasound-guided core biopsy was recommended and performed. This revealed an intraductal papilloma. The patient is here to consider excision.  Additional reasons for visit:  Recurrent abdominal pain is described as the following: As an additional issue the patient also has been having some episodic abdominal pain. She is followed by Dr. Jamal Collin in [place]. She has had a colonoscopy and polypectomy and also upper endoscopy with apparent findings of some GERD. She is on Protonix. She describes episodic pain which is pressure or gas-like in the right upper quadrant and was related to meals. Since then she has modified her diet and has been having much less or minimal problems in the last couple of months. She had a gallbladder ultrasound done 05/14/2017 showing multiple small echogenicity is present within the gallbladder consistent with polyps. No definite stones. Normal gallbladder wall thickness.   Past Surgical History (Tanisha A. Owens Shark, [place]; 07/19/2017 4:01 PM) Breast Mass; Local Excision  Right.  Diagnostic Studies History (Tanisha A. Owens Shark, [place]; 07/19/2017 4:01 PM) Colonoscopy  within last year Mammogram  within last year Pap Smear  1-5 years  ago  Allergies (Tanisha A. Owens Shark, [place]; 07/19/2017 4:03 PM) Sulfa Antibiotics  Itching, Rash.  Medication History (Tanisha A. Owens Shark, [place]; 07/19/2017 4:04 PM) ALPRAZolam (0.25MG  Tablet, Oral) Active. Enalapril Maleate (10MG  Tablet, Oral) Active. Prempro (0.3-1.5MG  Tablet, Oral) Active. TraZODone HCl (50MG  Tablet, Oral) Active. Hydroxychloroquine Sulfate (200MG  Tablet, Oral) Active. PriLOSEC (20MG  Capsule DR, Oral) Active. TraMADol HCl (50MG  Tablet, Oral) Active. Medications Reconciled  Social History (Tanisha A. Owens Shark, [place]; 07/19/2017 4:01 PM) Alcohol use  Moderate alcohol use. Caffeine use  Carbonated beverages, Coffee, Tea. Illicit drug use  Remotely quit drug use. Tobacco use  Former smoker.  Family History (Tanisha A. Owens Shark, [place]; 07/19/2017 4:01 PM) Alcohol Abuse  Father. Cancer  Father. Cerebrovascular Accident  Mother. Kidney Disease  Mother.  Pregnancy / Birth History (Tanisha A. Owens Shark, [place]; 07/19/2017 4:01 PM) Age at menarche  50 years. Age of menopause  <44 Contraceptive History  Oral contraceptives. Gravida  1 Length (months) of breastfeeding  3-6 Maternal age  86-30 Para  1  Other Problems (Tanisha A. Owens Shark, [place]; 07/19/2017 4:01 PM) Back Pain  High blood pressure  Other disease, cancer, significant illness     Review of Systems (Tanisha A. Brown RMA; 07/19/2017 4:01 PM) General Present- Fatigue and Weight Gain. Not Present- Appetite Loss, Chills, Fever, Night Sweats and Weight Loss. Skin Not Present- Change in Wart/Mole, Dryness, Hives, Jaundice, New Lesions, Non-Healing Wounds, Rash and Ulcer. HEENT Present- Ringing in the Ears. Not Present- Earache, Hearing Loss, Hoarseness, Nose Bleed, Oral Ulcers, Seasonal Allergies, Sinus Pain, Sore Throat, Visual Disturbances, Wears glasses/contact lenses and Yellow Eyes. Respiratory Present- Snoring. Not Present- Bloody sputum, Chronic Cough, Difficulty Breathing and Wheezing. Breast Present- Skin  Changes. Not Present- Breast Mass,  Breast Pain and Nipple Discharge. Female Genitourinary Not Present- Frequency, Nocturia, Painful Urination, Pelvic Pain and Urgency. Musculoskeletal Present- Joint Pain. Not Present- Back Pain, Joint Stiffness, Muscle Pain, Muscle Weakness and Swelling of Extremities. Neurological Not Present- Decreased Memory, Fainting, Headaches, Numbness, Seizures, Tingling, Tremor, Trouble walking and Weakness. Psychiatric Present- Change in Sleep Pattern. Not Present- Anxiety, Bipolar, Depression, Fearful and Frequent crying. Endocrine Present- Heat Intolerance and Hot flashes. Not Present- Cold Intolerance, Excessive Hunger, Hair Changes and New Diabetes. Hematology Not Present- Blood Thinners, Easy Bruising, Excessive bleeding, Gland problems, HIV and Persistent Infections.  Vitals (Tanisha A. Brown RMA; 07/19/2017 4:05 PM) 07/19/2017 4:04 PM Weight: 186.8 lb Height: 66in Body Surface Area: 1.94 m Body Mass Index: 30.15 kg/m  Temp.: 47F  Pulse: 72 (Regular)  BP: 120/80 (Sitting, Left Arm, Standard)       Physical Exam Marland Kitchen T. Kordelia Severin MD; 07/19/2017 5:00 PM) The physical exam findings are as follows: Note:General: Alert, well-developed and well nourished Caucasian female, in no distress Skin: Warm and dry without rash or infection. HEENT: No palpable masses or thyromegaly. Sclera nonicteric. Lymph nodes: No cervical, supraclavicular, nodes palpable. Breasts: Slight bruising right breast post biopsy. No palpable masses. No skin changes or nipple crusting or inversion. No palpable axillary adenopathy. Lungs: Breath sounds clear and equal. No wheezing or increased work of breathing. Cardiovascular: Regular rate and rhythm without murmer. No JVD or edema. Abdomen: Nondistended. Soft and nontender. No masses palpable. No organomegaly. No palpable hernias. Extremities: No edema or joint swelling or deformity. No chronic venous stasis  changes. Neurologic: Alert and fully oriented. Gait normal. No focal weakness. Psychiatric: Normal mood and affect. Thought content appropriate with normal judgement and insight    Assessment & Plan Marland Kitchen T. Yanis Larin MD; 07/19/2017 5:03 PM) PAPILLOMA OF RIGHT BREAST (D24.1) Impression: Recent abnormal mammogram and large core needle biopsy revealing papilloma. We discussed that there is a small, maybe as much as 10% chance of an underlying early stage malignancy. We discussed that the general recommendation is excision although that close follow-up with imaging is an option. After discussion she would like to have this area excised which I think would be standard. I discussed the procedure in detail including the nature of the surgery, expected recovery, risks of bleeding or infection, medication reactions anesthesia complications. She understands and would like to proceed. Current Plans Radioactive seed localized right breast lumpectomy under general anesthesia as an outpatient  You are being scheduled for surgery- Our schedulers will call you.  You should hear from our office's scheduling department within 5 working days about the location, date, and time of surgery. We try to make accommodations for patient's preferences in scheduling surgery, but sometimes the OR schedule or the surgeon's schedule prevents [place] from making those accommodations.  If you have not heard from our office [phone] in 5 working days, call the office and ask for your surgeon's nurse.  If you have other questions about your diagnosis, plan, or surgery, call the office and ask for your surgeon's nurse.  RIGHT UPPER QUADRANT ABDOMINAL PAIN (R10.11) Impression: She has had episodic right upper quadrant abdominal pain although currently improved with diet modification. Gallbladder ultrasound shows multiple small polyps. I told the patient felt that her gallbladder may well be the source of her pain. As long as  she is doing well and not having pain I would not recommend surgery but I advised her that she is having recurrent significant right upper quadrant pain I would highly suspect cholecystectomy  would be therapeutic. She will monitor this.

## 2017-08-06 ENCOUNTER — Encounter (HOSPITAL_BASED_OUTPATIENT_CLINIC_OR_DEPARTMENT_OTHER)
Admission: RE | Disposition: A | Discharge status: Home / Self Care | Payer: Self-pay | Source: Ambulatory Visit | Attending: General Surgery

## 2017-08-06 ENCOUNTER — Ambulatory Visit (HOSPITAL_BASED_OUTPATIENT_CLINIC_OR_DEPARTMENT_OTHER)
Admission: RE | Admit: 2017-08-06 | Discharge: 2017-08-06 | Disposition: A | Discharge status: Home / Self Care | Payer: 59 | Source: Ambulatory Visit | Attending: General Surgery | Admitting: General Surgery

## 2017-08-06 ENCOUNTER — Ambulatory Visit (HOSPITAL_BASED_OUTPATIENT_CLINIC_OR_DEPARTMENT_OTHER): Payer: 59 | Admitting: Anesthesiology

## 2017-08-06 ENCOUNTER — Ambulatory Visit
Admission: RE | Admit: 2017-08-06 | Discharge: 2017-08-06 | Disposition: A | Discharge status: Home / Self Care | Payer: PRIVATE HEALTH INSURANCE | Source: Ambulatory Visit | Attending: General Surgery | Admitting: General Surgery

## 2017-08-06 ENCOUNTER — Encounter (HOSPITAL_BASED_OUTPATIENT_CLINIC_OR_DEPARTMENT_OTHER): Payer: Self-pay | Admitting: *Deleted

## 2017-08-06 DIAGNOSIS — Z79899 Other long term (current) drug therapy: Secondary | ICD-10-CM | POA: Insufficient documentation

## 2017-08-06 DIAGNOSIS — N6021 Fibroadenosis of right breast: Secondary | ICD-10-CM | POA: Insufficient documentation

## 2017-08-06 DIAGNOSIS — D241 Benign neoplasm of right breast: Secondary | ICD-10-CM | POA: Insufficient documentation

## 2017-08-06 DIAGNOSIS — I10 Essential (primary) hypertension: Secondary | ICD-10-CM | POA: Insufficient documentation

## 2017-08-06 DIAGNOSIS — F329 Major depressive disorder, single episode, unspecified: Secondary | ICD-10-CM | POA: Insufficient documentation

## 2017-08-06 DIAGNOSIS — F419 Anxiety disorder, unspecified: Secondary | ICD-10-CM | POA: Insufficient documentation

## 2017-08-06 DIAGNOSIS — Z87891 Personal history of nicotine dependence: Secondary | ICD-10-CM | POA: Insufficient documentation

## 2017-08-06 HISTORY — PX: BREAST EXCISIONAL BIOPSY: SUR124

## 2017-08-06 HISTORY — DX: Anxiety disorder, unspecified: F41.9

## 2017-08-06 HISTORY — PX: BREAST LUMPECTOMY WITH RADIOACTIVE SEED LOCALIZATION: SHX6424

## 2017-08-06 SURGERY — BREAST LUMPECTOMY WITH RADIOACTIVE SEED LOCALIZATION
Anesthesia: General | Site: Breast | Laterality: Right

## 2017-08-06 MED ORDER — BUPIVACAINE-EPINEPHRINE (PF) 0.25% -1:200000 IJ SOLN
INTRAMUSCULAR | Status: AC
  Filled 2017-08-06: qty 30

## 2017-08-06 MED ORDER — CEFAZOLIN SODIUM-DEXTROSE 2-4 GM/100ML-% IV SOLN
2.0000 g | INTRAVENOUS | Status: AC
  Administered 2017-08-06: 2 g via INTRAVENOUS

## 2017-08-06 MED ORDER — LACTATED RINGERS IV SOLN
INTRAVENOUS | Status: DC
  Administered 2017-08-06: 08:00:00 via INTRAVENOUS

## 2017-08-06 MED ORDER — HYDROCODONE-ACETAMINOPHEN 5-325 MG PO TABS: 1.0000 | ORAL_TABLET | Freq: Four times a day (QID) | ORAL | 0 refills | Status: DC | PRN

## 2017-08-06 MED ORDER — PROPOFOL 10 MG/ML IV BOLUS
INTRAVENOUS | Status: AC
  Filled 2017-08-06: qty 20

## 2017-08-06 MED ORDER — SCOPOLAMINE 1 MG/3DAYS TD PT72: 1.0000 | MEDICATED_PATCH | TRANSDERMAL | Status: DC

## 2017-08-06 MED ORDER — CHLORHEXIDINE GLUCONATE CLOTH 2 % EX PADS: 6.0000 | MEDICATED_PAD | Freq: Once | CUTANEOUS | Status: DC

## 2017-08-06 MED ORDER — LIDOCAINE 2% (20 MG/ML) 5 ML SYRINGE
INTRAMUSCULAR | Status: DC | PRN
  Administered 2017-08-06: 100 mg via INTRAVENOUS

## 2017-08-06 MED ORDER — ONDANSETRON HCL 4 MG/2ML IJ SOLN
INTRAMUSCULAR | Status: AC
Start: 2017-08-06 — End: 2017-08-06
  Filled 2017-08-06: qty 2

## 2017-08-06 MED ORDER — LIDOCAINE 2% (20 MG/ML) 5 ML SYRINGE
INTRAMUSCULAR | Status: AC
  Filled 2017-08-06: qty 5

## 2017-08-06 MED ORDER — PROPOFOL 10 MG/ML IV BOLUS
INTRAVENOUS | Status: DC | PRN
  Administered 2017-08-06: 200 mg via INTRAVENOUS

## 2017-08-06 MED ORDER — PROMETHAZINE HCL 25 MG/ML IJ SOLN: 6.2500 mg | INTRAMUSCULAR | Status: DC | PRN

## 2017-08-06 MED ORDER — ACETAMINOPHEN 500 MG PO TABS
ORAL_TABLET | ORAL | Status: AC
  Filled 2017-08-06: qty 2

## 2017-08-06 MED ORDER — HYDROMORPHONE HCL 1 MG/ML IJ SOLN: 0.2500 mg | INTRAMUSCULAR | Status: DC | PRN

## 2017-08-06 MED ORDER — GABAPENTIN 300 MG PO CAPS
ORAL_CAPSULE | ORAL | Status: AC
  Filled 2017-08-06: qty 1

## 2017-08-06 MED ORDER — MIDAZOLAM HCL 2 MG/2ML IJ SOLN
INTRAMUSCULAR | Status: AC
  Filled 2017-08-06: qty 2

## 2017-08-06 MED ORDER — ACETAMINOPHEN 500 MG PO TABS
1000.0000 mg | ORAL_TABLET | ORAL | Status: AC
  Administered 2017-08-06: 1000 mg via ORAL

## 2017-08-06 MED ORDER — GABAPENTIN 300 MG PO CAPS
300.0000 mg | ORAL_CAPSULE | ORAL | Status: AC
  Administered 2017-08-06: 300 mg via ORAL

## 2017-08-06 MED ORDER — FENTANYL CITRATE (PF) 100 MCG/2ML IJ SOLN
50.0000 ug | INTRAMUSCULAR | Status: DC | PRN
  Administered 2017-08-06: 100 ug via INTRAVENOUS

## 2017-08-06 MED ORDER — SCOPOLAMINE 1 MG/3DAYS TD PT72: 1.0000 | MEDICATED_PATCH | Freq: Once | TRANSDERMAL | Status: DC | PRN

## 2017-08-06 MED ORDER — DEXAMETHASONE SODIUM PHOSPHATE 4 MG/ML IJ SOLN
INTRAMUSCULAR | Status: DC | PRN
  Administered 2017-08-06: 10 mg via INTRAVENOUS

## 2017-08-06 MED ORDER — CEFAZOLIN SODIUM-DEXTROSE 2-4 GM/100ML-% IV SOLN
INTRAVENOUS | Status: AC
  Filled 2017-08-06: qty 100

## 2017-08-06 MED ORDER — DEXAMETHASONE SODIUM PHOSPHATE 10 MG/ML IJ SOLN
INTRAMUSCULAR | Status: AC
  Filled 2017-08-06: qty 1

## 2017-08-06 MED ORDER — ONDANSETRON HCL 4 MG/2ML IJ SOLN
INTRAMUSCULAR | Status: DC | PRN
  Administered 2017-08-06: 4 mg via INTRAVENOUS

## 2017-08-06 MED ORDER — BUPIVACAINE-EPINEPHRINE (PF) 0.25% -1:200000 IJ SOLN
INTRAMUSCULAR | Status: DC | PRN
Start: 2017-08-06 — End: 2017-08-06
  Administered 2017-08-06: 20 mL

## 2017-08-06 MED ORDER — MIDAZOLAM HCL 2 MG/2ML IJ SOLN
1.0000 mg | INTRAMUSCULAR | Status: DC | PRN
  Administered 2017-08-06: 2 mg via INTRAVENOUS

## 2017-08-06 MED ORDER — FENTANYL CITRATE (PF) 100 MCG/2ML IJ SOLN
INTRAMUSCULAR | Status: AC
  Filled 2017-08-06: qty 2

## 2017-08-06 SURGICAL SUPPLY — 52 items
ADH SKN CLS APL DERMABOND .7 (GAUZE/BANDAGES/DRESSINGS) ×1
BINDER BREAST LRG (GAUZE/BANDAGES/DRESSINGS) IMPLANT
BINDER BREAST MEDIUM (GAUZE/BANDAGES/DRESSINGS) IMPLANT
BINDER BREAST XLRG (GAUZE/BANDAGES/DRESSINGS) IMPLANT
BINDER BREAST XXLRG (GAUZE/BANDAGES/DRESSINGS) IMPLANT
BLADE SURG 15 STRL LF DISP TIS (BLADE) ×1 IMPLANT
BLADE SURG 15 STRL SS (BLADE) ×3
CANISTER SUC SOCK COL 7IN (MISCELLANEOUS) IMPLANT
CANISTER SUCT 1200ML W/VALVE (MISCELLANEOUS) IMPLANT
CHLORAPREP W/TINT 26ML (MISCELLANEOUS) ×3 IMPLANT
CLIP VESOCCLUDE SM WIDE 6/CT (CLIP) IMPLANT
COVER BACK TABLE 60X90IN (DRAPES) ×3 IMPLANT
COVER MAYO STAND STRL (DRAPES) ×3 IMPLANT
COVER PROBE W GEL 5X96 (DRAPES) ×3 IMPLANT
DECANTER SPIKE VIAL GLASS SM (MISCELLANEOUS) IMPLANT
DERMABOND ADVANCED (GAUZE/BANDAGES/DRESSINGS) ×2
DERMABOND ADVANCED .7 DNX12 (GAUZE/BANDAGES/DRESSINGS) ×1 IMPLANT
DEVICE DUBIN W/COMP PLATE 8390 (MISCELLANEOUS) ×3 IMPLANT
DRAPE LAPAROSCOPIC ABDOMINAL (DRAPES) ×3 IMPLANT
DRAPE UTILITY XL STRL (DRAPES) ×3 IMPLANT
ELECT COATED BLADE 2.86 ST (ELECTRODE) ×3 IMPLANT
ELECT REM PT RETURN 9FT ADLT (ELECTROSURGICAL) ×3
ELECTRODE REM PT RTRN 9FT ADLT (ELECTROSURGICAL) ×1 IMPLANT
GLOVE BIO SURGEON STRL SZ 6.5 (GLOVE) ×1 IMPLANT
GLOVE BIO SURGEONS STRL SZ 6.5 (GLOVE) ×1
GLOVE BIOGEL PI IND STRL 7.0 (GLOVE) IMPLANT
GLOVE BIOGEL PI IND STRL 8 (GLOVE) ×1 IMPLANT
GLOVE BIOGEL PI INDICATOR 7.0 (GLOVE) ×2
GLOVE BIOGEL PI INDICATOR 8 (GLOVE) ×2
GLOVE ECLIPSE 7.5 STRL STRAW (GLOVE) ×3 IMPLANT
GOWN STRL REUS W/ TWL LRG LVL3 (GOWN DISPOSABLE) ×1 IMPLANT
GOWN STRL REUS W/ TWL XL LVL3 (GOWN DISPOSABLE) ×1 IMPLANT
GOWN STRL REUS W/TWL LRG LVL3 (GOWN DISPOSABLE) ×3
GOWN STRL REUS W/TWL XL LVL3 (GOWN DISPOSABLE) ×3
ILLUMINATOR WAVEGUIDE N/F (MISCELLANEOUS) IMPLANT
KIT MARKER MARGIN INK (KITS) ×3 IMPLANT
LIGHT WAVEGUIDE WIDE FLAT (MISCELLANEOUS) IMPLANT
NDL HYPO 25X1 1.5 SAFETY (NEEDLE) ×1 IMPLANT
NEEDLE HYPO 25X1 1.5 SAFETY (NEEDLE) ×3 IMPLANT
NS IRRIG 1000ML POUR BTL (IV SOLUTION) IMPLANT
PACK BASIN DAY SURGERY FS (CUSTOM PROCEDURE TRAY) ×3 IMPLANT
PENCIL BUTTON HOLSTER BLD 10FT (ELECTRODE) ×3 IMPLANT
SLEEVE SCD COMPRESS KNEE MED (MISCELLANEOUS) ×3 IMPLANT
SPONGE LAP 4X18 X RAY DECT (DISPOSABLE) ×3 IMPLANT
SUT MON AB 5-0 PS2 18 (SUTURE) ×3 IMPLANT
SUT VICRYL 3-0 CR8 SH (SUTURE) ×3 IMPLANT
SYR CONTROL 10ML LL (SYRINGE) ×3 IMPLANT
TOWEL OR 17X24 6PK STRL BLUE (TOWEL DISPOSABLE) ×3 IMPLANT
TOWEL OR NON WOVEN STRL DISP B (DISPOSABLE) ×3 IMPLANT
TUBE CONNECTING 20'X1/4 (TUBING)
TUBE CONNECTING 20X1/4 (TUBING) IMPLANT
YANKAUER SUCT BULB TIP NO VENT (SUCTIONS) IMPLANT

## 2017-08-06 NOTE — Interval H&P Note (Signed)
History and Physical Interval Note:  08/06/2017 8:15 AM  [name]  has presented today for surgery, with the diagnosis of papilloma right breast  The various methods of treatment have been discussed with the patient and family. After consideration of risks, benefits and other options for treatment, the patient has consented to  Procedure(s): RIGHT BREAST LUMPECTOMY WITH RADIOACTIVE SEED LOCALIZATION ERAS PATHWAY (Right) as a surgical intervention .  The patient's history has been reviewed, patient examined, no change in status, stable for surgery.  I have reviewed the patient's chart and labs.  Questions were answered to the patient's satisfaction.     [name] T

## 2017-08-06 NOTE — Transfer of Care (Signed)
Immediate Anesthesia Transfer of Care Note  Patient: [name]  Procedure(s) Performed: RIGHT BREAST LUMPECTOMY WITH RADIOACTIVE SEED LOCALIZATION ERAS PATHWAY (Right Breast)  Patient Location: PACU  Anesthesia Type:General  Level of Consciousness: awake, sedated and patient cooperative  Airway & Oxygen Therapy: Patient Spontanous Breathing and Patient connected to face mask oxygen  Post-op Assessment: Report given to RN and Post -op Vital signs reviewed and stable  Post vital signs: Reviewed and stable  Last Vitals:  Vitals:   08/06/17 0743  BP: (!) 131/58  Pulse: 61  Resp: 18  Temp: 36.6 C  SpO2: 100%    Last Pain:  Vitals:   08/06/17 0743  TempSrc: Oral         Complications: No apparent anesthesia complications

## 2017-08-06 NOTE — Anesthesia Postprocedure Evaluation (Signed)
Anesthesia Post Note  Patient: [name]  Procedure(s) Performed: RIGHT BREAST LUMPECTOMY WITH RADIOACTIVE SEED LOCALIZATION ERAS PATHWAY (Right Breast)     Patient location during evaluation: PACU Anesthesia Type: General Level of consciousness: sedated Pain management: pain level controlled Vital Signs Assessment: post-procedure vital signs reviewed and stable Respiratory status: spontaneous breathing and respiratory function stable Cardiovascular status: stable Postop Assessment: no apparent nausea or vomiting Anesthetic complications: no    Last Vitals:  Vitals:   08/06/17 0945 08/06/17 1025  BP: 113/70 (!) 124/55  Pulse: 65 73  Resp: 16 18  Temp:  36.7 C  SpO2: 100% 99%    Last Pain:  Vitals:   08/06/17 1025  TempSrc:   PainSc: 0-No pain                 Eran Mistry DANIEL

## 2017-08-06 NOTE — Discharge Instructions (Signed)
[facility] °Office Phone Number [phone] ° °BREAST BIOPSY/ PARTIAL MASTECTOMY: POST OP INSTRUCTIONS ° °Always review your discharge instruction sheet given to you by the facility where your surgery was performed. ° °IF YOU HAVE DISABILITY OR FAMILY LEAVE FORMS, YOU MUST BRING THEM TO THE OFFICE FOR PROCESSING.  DO NOT GIVE THEM TO YOUR DOCTOR. ° °1. A prescription for pain medication may be given to you upon discharge.  Take your pain medication as prescribed, if needed.  If narcotic pain medicine is not needed, then you may take acetaminophen (Tylenol) or ibuprofen (Advil) as needed. °2. Take your usually prescribed medications unless otherwise directed °3. If you need a refill on your pain medication, please contact your pharmacy.  They will contact our office to request authorization.  Prescriptions will not be filled after 5pm or on week-ends. °4. You should eat very light the first 24 hours after surgery, such as soup, crackers, pudding, etc.  Resume your normal diet the day after surgery. °5. Most patients will experience some swelling and bruising in the breast.  Ice packs and a good support bra will help.  Swelling and bruising can take several days to resolve.  °6. It is common to experience some constipation if taking pain medication after surgery.  Increasing fluid intake and taking a stool softener will usually help or prevent this problem from occurring.  A mild laxative (Milk of Magnesia or Miralax) should be taken according to package directions if there are no bowel movements after 48 hours. °7. Unless discharge instructions indicate otherwise, you may remove your bandages 24-48 hours after surgery, and you may shower at that time.  You may have steri-strips (small skin tapes) in place directly over the incision.  These strips should be left on the skin for 7-10 days.  If your surgeon used skin glue on the incision, you may shower in 24 hours.  The glue will flake off over the  next 2-3 weeks.  Any sutures or staples will be removed at the office during your follow-up visit. °8. ACTIVITIES:  You may resume regular daily activities (gradually increasing) beginning the next day.  Wearing a good support bra or sports bra minimizes pain and swelling.  You may have sexual intercourse when it is comfortable. °a. You may drive when you no longer are taking prescription pain medication, you can comfortably wear a seatbelt, and you can safely maneuver your car and apply brakes. °b. RETURN TO WORK:  ______________________________________________________________________________________ °9. You should see your doctor in the office for a follow-up appointment approximately two weeks after your surgery.  Your doctor’s nurse will typically make your follow-up appointment when she calls you with your pathology report.  Expect your pathology report 2-3 business days after your surgery.  You may call to check if you do not hear from us after three days. °10. OTHER INSTRUCTIONS: _______________________________________________________________________________________________ _____________________________________________________________________________________________________________________________________ °_____________________________________________________________________________________________________________________________________ °_____________________________________________________________________________________________________________________________________ ° °WHEN TO CALL YOUR DOCTOR: °1. Fever over 101.0 °2. Nausea and/or vomiting. °3. Extreme swelling or bruising. °4. Continued bleeding from incision. °5. Increased pain, redness, or drainage from the incision. ° °The clinic staff is available to answer your questions during regular business hours.  Please don’t hesitate to call and ask to speak to one of the nurses for clinical concerns.  If you have a medical emergency, go to the nearest  emergency room or call 911.  A surgeon from [facility] is always on call at the hospital. ° °For further questions, please visit [email or web address]  ° ° ° ° °  Post Anesthesia Home Care Instructions ° °Activity: °Get plenty of rest for the remainder of the day. A responsible individual must stay with you for 24 hours following the procedure.  °For the next 24 hours, DO NOT: °-Drive a car °-Operate machinery °-Drink alcoholic beverages °-Take any medication unless instructed by your physician °-Make any legal decisions or sign important papers. ° °Meals: °Start with liquid foods such as gelatin or soup. Progress to regular foods as tolerated. Avoid greasy, spicy, heavy foods. If nausea and/or vomiting occur, drink only clear liquids until the nausea and/or vomiting subsides. Call your physician if vomiting continues. ° °Special Instructions/Symptoms: °Your throat may feel dry or sore from the anesthesia or the breathing tube placed in your throat during surgery. If this causes discomfort, gargle with warm salt water. The discomfort should disappear within 24 hours. ° °If you had a scopolamine patch placed behind your ear for the management of post- operative nausea and/or vomiting: ° °1. The medication in the patch is effective for 72 hours, after which it should be removed.  Wrap patch in a tissue and discard in the trash. Wash hands thoroughly with soap and water. °2. You may remove the patch earlier than 72 hours if you experience unpleasant side effects which may include dry mouth, dizziness or visual disturbances. °3. Avoid touching the patch. Wash your hands with soap and water after contact with the patch. °  ° °

## 2017-08-06 NOTE — Op Note (Signed)
Preoperative Diagnosis: papilloma right breast  Postoprative Diagnosis: papilloma right breast  Procedure: Procedure(s): RIGHT BREAST LUMPECTOMY WITH RADIOACTIVE SEED LOCALIZATION ERAS PATHWAY   Surgeon: Excell Seltzer T   Assistants: none  Anesthesia:  General LMA anesthesia  Indications: patient is a 61 year old female with a recent abnormal screening mammogram and large core needle biopsy of the right breast showing intraductal papilloma. After discussion of options regarding management including observation versus excision we have elected to proceed with radioactive seed localized right breast lumpectomy to rule out underlying malignancy. The procedure and indications, recovery and risks have been discussed and detailed elsewhere.    Procedure Detail:  Patient had previously undergone accurate placement of a radioactive seed at the papilloma and clip site in the central right breast.  She was taken to the operating room, placed in the supine position on the operating table and laryngeal mask general anesthesia induced. She received preoperativeIV antibiotics. The right breast was widely sterilely prepped and draped. Patient timeout was performed and correct procedure verified.The neoprobe was used to localize the seed near the areolar border in the upper outer quadrant. A circumareolar incision was used and dissection was carried down through the subcutaneous tissue toward the breast capsule. Using the neoprobe for guidance and approximately 1-1/2 cm globular specimen of breast tissue was excisedaround the seed. The tissue was very dense.The specimen was inked for margins. Specimen x-ray showed the marking clip and seeds centrally located within the specimen. This was sent for pathology. The soft tissue was infiltrated with Marcaine. Complete hemostasis was assured. The lumpectomy cavity was marked with clips. The deep breast and subcutaneous tissue was closed with interrupted 3-0 Vicryl and  the skin with running subcuticular 5-0 Monocryl and Dermabond.Sponge needle and instrument counts were correct.    Findings: As above  Estimated Blood Loss:  Minimal         Drains: none  Blood Given: none          Specimens: right breast lumpectomy        Complications:  * No complications entered in OR log *         Disposition: PACU - hemodynamically stable.         Condition: stable

## 2017-08-06 NOTE — Anesthesia Procedure Notes (Signed)
Procedure Name: LMA Insertion Date/Time: 08/06/2017 8:25 AM Performed by: Lyndee Leo Pre-anesthesia Checklist: Patient identified, Emergency Drugs available, Suction available and Patient being monitored Patient Re-evaluated:Patient Re-evaluated prior to induction Oxygen Delivery Method: Circle system utilized Preoxygenation: Pre-oxygenation with 100% oxygen Induction Type: IV induction Ventilation: Mask ventilation without difficulty LMA: LMA inserted LMA Size: 4.0 Number of attempts: 1 Airway Equipment and Method: Bite block Placement Confirmation: positive ETCO2 Tube secured with: Tape Dental Injury: Teeth and Oropharynx as per pre-operative assessment

## 2017-08-07 ENCOUNTER — Encounter (HOSPITAL_BASED_OUTPATIENT_CLINIC_OR_DEPARTMENT_OTHER): Payer: Self-pay | Admitting: General Surgery

## 2017-08-22 ENCOUNTER — Telehealth: Payer: Self-pay | Admitting: *Deleted

## 2017-08-22 NOTE — Telephone Encounter (Signed)
Called [facility] for lab results.  Given to MD.

## 2017-08-22 NOTE — Telephone Encounter (Signed)
Patient called reporting that she had labs drawn at [facility] 2-3 weeks ago and she never got the results. Asking for a return call  [phone]. She doe snot have follow up until 2/18. Looks like she just had surgery 10/16.

## 2017-08-22 NOTE — Telephone Encounter (Signed)
  Please obtain [facility] results.  So we can call her with the results.  M

## 2017-08-23 NOTE — Telephone Encounter (Signed)
Called patient to review lab results with patient per her request.  Patient states she was given [facility] forms in February when she was here to have labs drawn periodically.  The most recent one she was later having done.  She will have another lab in February prior to her next appt.

## 2017-12-06 ENCOUNTER — Other Ambulatory Visit: Payer: Self-pay | Admitting: Urgent Care

## 2017-12-06 DIAGNOSIS — D72821 Monocytosis (symptomatic): Secondary | ICD-10-CM

## 2017-12-06 DIAGNOSIS — D75839 Thrombocytosis, unspecified: Secondary | ICD-10-CM

## 2017-12-06 DIAGNOSIS — D473 Essential (hemorrhagic) thrombocythemia: Secondary | ICD-10-CM

## 2017-12-08 NOTE — Progress Notes (Addendum)
[facility] day:  12/09/2017   Chief Complaint: DEEMA JUNCAJ is a 62 y.o. female with chronic thrombocytosis and monocytosis who is seen for 1 year assessment.  HPI:  The patient was last seen in the hematology clinic on 12/07/2016.  At that time, she denied any complaint.  Exam was unremarkable.  Hematocrit was 33.2.  Platelet count was 490,000.  She had persistent monocytosis (1500).  CALR mutation and MPL from [facility] on 07/16/2017 were negative.  CBC on 07/16/2017 revealed a hematocrit of 34, hemoglobin 11.7, MCV 95, platelets 591,000, WBC 8900 with an [place] of 5100.  Absolute monocyte count was 1200.  Additional testing on 11/25/2017 revealed the following normal studies: JAK2 V617F, JAK2 exons 12-15, and CALR.   MPL is pending.  CBC on 12/03/2017 revealed a hematocrit of 35.1, hemoglobin 11.8, MCV 95, platelets 609,000, WBC 9400 with an ANC of 5400.  Differential included 1300 monocytes.  Creatinine was 1.06.  LFTs were normal.  During the interim, she has felt fine.  She states that in 2017, she was unable to lose weight.  She saw Dr. Jefm Bryant in 2018.  "Lupus test" was negative.  She denies any issues with infections.  She notes right knee pain and discomfort in left hip and left leg s/p falling off of a ladder.  She landed on her right side.  She describes being stiff in the morning and "needing to stretch".   Past Medical History:  Diagnosis Date  . Abnormal pap   . Anemia   . Anxiety   . History of lupus 1987  . Hypertension   . Renal insufficiency 1988   kidney biopsy 1988 and 1998    Past Surgical History:  Procedure Laterality Date  . BREAST LUMPECTOMY WITH RADIOACTIVE SEED LOCALIZATION Right 08/06/2017   Procedure: RIGHT BREAST LUMPECTOMY WITH RADIOACTIVE SEED LOCALIZATION ERAS PATHWAY;  Surgeon: Excell Seltzer, MD;  Location: [facility];  Service: General;  Laterality: Right;  . BREAST SURGERY  10/00   right breast benign fibroadenoma  . COLONOSCOPY  2012  . COLONOSCOPY WITH PROPOFOL N/A 05/01/2017   Procedure: COLONOSCOPY WITH PROPOFOL;  Surgeon: Christene Lye, MD;  Location: [facility];  Service: Endoscopy;  Laterality: N/A;  . ESOPHAGOGASTRODUODENOSCOPY (EGD) WITH PROPOFOL N/A 05/01/2017   Procedure: ESOPHAGOGASTRODUODENOSCOPY (EGD) WITH PROPOFOL;  Surgeon: Christene Lye, MD;  Location: [facility];  Service: Endoscopy;  Laterality: N/A;  . NASAL SEPTUM SURGERY  2007  . TUBAL LIGATION  1990    Family History  Problem Relation Age of Onset  . Cancer Father        oral cancer  . Cancer Maternal Grandmother        ovarian cancer  . Hypertension Maternal Grandmother   . Diabetes Maternal Grandfather   . Diabetes Paternal Aunt     Social History:  reports that  has never smoked. she has never used smokeless tobacco. She reports that she drinks about 0.6 - 1.2 oz of alcohol per week. She reports that she does not use drugs.  Her husband had stage IIIB lung cancer.  He died December 12, 2016.  She has a part time job. The patient is alone today.    Allergies:  Allergies  Allergen Reactions  . Sulfa Antibiotics Itching and Rash    Current Medications: Current Outpatient Medications  Medication Sig Dispense Refill  . ALPRAZolam (XANAX) 0.25 MG tablet Take 0.25 mg by mouth as needed.    Marland Kitchen  aspirin 81 MG tablet Take 81 mg by mouth daily.    . diazepam (VALIUM) 5 MG tablet Take 5 mg by mouth every 6 (six) hours as needed for anxiety.    . enalapril (VASOTEC) 10 MG tablet Take 10 mg by mouth daily.    Marland Kitchen estrogen, conjugated,-medroxyprogesterone (PREMPRO) 0.3-1.5 MG tablet Take 1 tablet by mouth daily. 84 tablet 3  . hydroxychloroquine (PLAQUENIL) 200 MG tablet Take 200 mg by mouth daily.     . traZODone (DESYREL) 50 MG tablet Take 50 mg by mouth.     Marland Kitchen UNABLE TO FIND Take 3 tablets by mouth daily. Med Name: Vern Claude Vitamin Pak     No current facility-administered  medications for this visit.     Review of Systems:  GENERAL:  Feels "fine".  No fevers or sweats. Weight down 2 pounds since 04/2017. PERFORMANCE STATUS (ECOG):  1 HEENT:  No visual changes, runny nose, sore throat, mouth sores or tenderness. Lungs: No shortness of breath or cough.  No hemoptysis. Cardiac:  No chest pain, palpitations, orthopnea, or PND. GI:  No nausea, vomiting, diarrhea, constipation, melena or hematochezia.  Colonoscopy in 2009 or 2013 with Dr. Jamal Collin. GU:  No urgency, frequency, dysuria, or hematuria. Musculoskeletal:  Arthritis.  No back pain.  Right knee and left hip and leg pain s/p fall.  No muscle tenderness. Extremities:  No pain or swelling. Skin:  No rashes or skin changes. Neuro:  No headache, numbness or weakness, balance or coordination issues. Endocrine:  No diabetes, thyroid issues, hot flashes or night sweats. Psych: No mood changes.   Pain:  No focal pain. Review of systems:  All other systems reviewed and found to be negative.  Physical Exam: Blood pressure 130/78, pulse 76, temperature 98.1 F (36.7 C), resp. rate 18, weight 185 lb 5 oz (84.1 kg), last menstrual period 08/22/2008. GENERAL:  Well developed, well nourished, woman sitting comfortably in the exam room in no acute distress. MENTAL STATUS:  Alert and oriented to person, place and time. HEAD:  Owens Shark styled hair.  Normocephalic, atraumatic, face symmetric, no Cushingoid features. EYES:  Glasses.  Blue eyes.  Pupils equal round and reactive to light and accomodation.  No conjunctivitis or scleral icterus. ENT:  Oropharynx clear without lesion.  Tongue normal. Mucous membranes moist.  RESPIRATORY:  Clear to auscultation without rales, wheezes or rhonchi. CARDIOVASCULAR:  Regular rate and rhythm without murmur, rub or gallop. ABDOMEN:  Soft, non-tender, with active bowel sounds, and no hepatosplenomegaly.  No masses. SKIN:  No rashes, ulcers or lesions. EXTREMITIES: No edema, no skin  discoloration or tenderness.  No palpable cords. LYMPH NODES: No palpable cervical, supraclavicular, axillary or inguinal adenopathy  NEUROLOGICAL: Unremarkable. PSYCH:  Appropriate.   No visits with results within 3 Day(s) from this visit.  Latest known visit with results is:  Admission on 08/06/2017, Discharged on 08/06/2017  Component Date Value Ref Range Status  . Sodium 08/05/2017 138  135 - 145 mmol/L Final  . Potassium 08/05/2017 4.3  3.5 - 5.1 mmol/L Final  . Chloride 08/05/2017 106  101 - 111 mmol/L Final  . CO2 08/05/2017 27  22 - 32 mmol/L Final  . Glucose, Bld 08/05/2017 100* 65 - 99 mg/dL Final  . BUN 08/05/2017 18  6 - 20 mg/dL Final  . Creatinine, Ser 08/05/2017 1.17* 0.44 - 1.00 mg/dL Final  . Calcium 08/05/2017 9.1  8.9 - 10.3 mg/dL Final  . GFR calc non Af Amer 08/05/2017 49* >60 mL/min  Final  . GFR calc Af Amer 08/05/2017 57* >60 mL/min Final   Comment: (NOTE) The eGFR has been calculated using the CKD EPI equation. This calculation has not been validated in all clinical situations. eGFR's persistently <60 mL/min signify possible Chronic Kidney Disease.   Georgiann Hahn gap 08/05/2017 5  5 - 15 Final    Assessment:  BERDENE ASKARI is a 62 y.o. female with chronic thrombocytosis and monocytosis since 2012.  She may have an early myeloproliferative disorder such as CMML without current need for treatment.  She has a mild normocytic anemia.   Work-up in 20012 revealed a normal LDH, BCR-ABL, JAK2 V617F mutation, ESR, and iron studies.  CALR mutation and MPL from [facility] on 07/16/2017 were negative.   She has a history of quiescent lupus.  She has early arthritis.  Her last colonoscopy was in 2009 or 2013.  Diet is fair.  Symptomatically, she denies any complaint.  Exam is unremarkable.  Hematocrit is 35.1.  Platelet count is 609,000.  She has persistent monocytosis (1300).  Plan: 1.  Review interval blood counts. Discuss probable myeloproliferative disorder.  She may  have CMML.  At this point, I do not feel that her thrombocytosis is reactive.  She does not have lupus per rheumatology.  Depending on blood counts and symptomatology, may pursue bone marrow in the future.  2.  Discuss plan to return to clinic for reassessment if any concerning symptoms or change in counts. 3.  [facility] slip for BCR-ABL, JAK2 with reflex, flow cytometry. 4.  [facility] slip for 1 year (CBC with diff, CMP). 5.  RTC in 1 year for MD assessment and review of labs   Lequita Asal, MD  12/09/2017, 5:35 PM

## 2017-12-09 ENCOUNTER — Inpatient Hospital Stay: Payer: PRIVATE HEALTH INSURANCE | Attending: Hematology and Oncology | Admitting: Hematology and Oncology

## 2017-12-09 ENCOUNTER — Encounter: Payer: Self-pay | Admitting: Hematology and Oncology

## 2017-12-09 VITALS — BP 130/78 | HR 76 | Temp 98.1°F | Resp 18 | Wt 185.3 lb

## 2017-12-09 DIAGNOSIS — Z8041 Family history of malignant neoplasm of ovary: Secondary | ICD-10-CM | POA: Diagnosis not present

## 2017-12-09 DIAGNOSIS — M329 Systemic lupus erythematosus, unspecified: Secondary | ICD-10-CM | POA: Insufficient documentation

## 2017-12-09 DIAGNOSIS — D72821 Monocytosis (symptomatic): Secondary | ICD-10-CM | POA: Diagnosis not present

## 2017-12-09 DIAGNOSIS — Z79899 Other long term (current) drug therapy: Secondary | ICD-10-CM | POA: Insufficient documentation

## 2017-12-09 DIAGNOSIS — D649 Anemia, unspecified: Secondary | ICD-10-CM | POA: Diagnosis not present

## 2017-12-09 DIAGNOSIS — Z882 Allergy status to sulfonamides status: Secondary | ICD-10-CM | POA: Insufficient documentation

## 2017-12-09 DIAGNOSIS — M1990 Unspecified osteoarthritis, unspecified site: Secondary | ICD-10-CM | POA: Diagnosis not present

## 2017-12-09 DIAGNOSIS — I10 Essential (primary) hypertension: Secondary | ICD-10-CM | POA: Insufficient documentation

## 2017-12-09 DIAGNOSIS — Z7982 Long term (current) use of aspirin: Secondary | ICD-10-CM | POA: Insufficient documentation

## 2017-12-09 DIAGNOSIS — D75839 Thrombocytosis, unspecified: Secondary | ICD-10-CM

## 2017-12-09 DIAGNOSIS — Z9181 History of falling: Secondary | ICD-10-CM | POA: Diagnosis not present

## 2017-12-09 DIAGNOSIS — D473 Essential (hemorrhagic) thrombocythemia: Secondary | ICD-10-CM | POA: Insufficient documentation

## 2017-12-09 DIAGNOSIS — M25561 Pain in right knee: Secondary | ICD-10-CM | POA: Diagnosis not present

## 2017-12-09 DIAGNOSIS — M25551 Pain in right hip: Secondary | ICD-10-CM | POA: Insufficient documentation

## 2017-12-09 NOTE — Progress Notes (Signed)
Patient offers no complaints today. 

## 2017-12-12 ENCOUNTER — Telehealth: Payer: Self-pay | Admitting: *Deleted

## 2017-12-12 NOTE — Telephone Encounter (Signed)
Patient here for MD visit earlier this week.  She wanted to know if she goes on DoTerra vitamin supplements will it interfere with any of the other medications she is taking.  Copied ingredient list on box.  Asked pharmacist, Elesa Massed, Pharmacist to look at the ingredients and advise.  She states the only thing she can find is that the herbals may interfere with the antiplatelet effect of aspirin.  There is nothing that indicates the vitamins will be harmful to her or that will benefit her.  Basically, it is up to the patient to decide whether to take or not.

## 2017-12-17 ENCOUNTER — Encounter: Payer: Self-pay | Admitting: Hematology and Oncology

## 2017-12-31 ENCOUNTER — Encounter: Payer: Self-pay | Admitting: Hematology and Oncology

## 2018-01-08 ENCOUNTER — Other Ambulatory Visit: Payer: Self-pay | Admitting: Hematology and Oncology

## 2018-03-26 ENCOUNTER — Ambulatory Visit: Payer: PRIVATE HEALTH INSURANCE | Admitting: Nurse Practitioner

## 2018-03-27 ENCOUNTER — Encounter: Payer: Self-pay | Admitting: Certified Nurse Midwife

## 2018-03-27 ENCOUNTER — Telehealth: Payer: Self-pay | Admitting: Certified Nurse Midwife

## 2018-03-27 ENCOUNTER — Ambulatory Visit: Payer: PRIVATE HEALTH INSURANCE | Admitting: Certified Nurse Midwife

## 2018-03-27 ENCOUNTER — Other Ambulatory Visit: Payer: Self-pay

## 2018-03-27 VITALS — BP 120/72 | HR 70 | Resp 16 | Ht 65.5 in | Wt 181.0 lb

## 2018-03-27 DIAGNOSIS — Z124 Encounter for screening for malignant neoplasm of cervix: Secondary | ICD-10-CM

## 2018-03-27 DIAGNOSIS — Z23 Encounter for immunization: Secondary | ICD-10-CM

## 2018-03-27 DIAGNOSIS — Z01419 Encounter for gynecological examination (general) (routine) without abnormal findings: Secondary | ICD-10-CM

## 2018-03-27 DIAGNOSIS — N951 Menopausal and female climacteric states: Secondary | ICD-10-CM

## 2018-03-27 DIAGNOSIS — Z0000 Encounter for general adult medical examination without abnormal findings: Secondary | ICD-10-CM | POA: Diagnosis not present

## 2018-03-27 DIAGNOSIS — Z79890 Hormone replacement therapy (postmenopausal): Secondary | ICD-10-CM | POA: Diagnosis not present

## 2018-03-27 DIAGNOSIS — Z8679 Personal history of other diseases of the circulatory system: Secondary | ICD-10-CM

## 2018-03-27 NOTE — Progress Notes (Addendum)
62 y.o. [id number] Widowed  Caucasian Fe here for annual exam. Menopausal on HRT taking pills twice weekly only or increases if needed. Occasional hot flash or night sweats, no issues. "Probably should just stop them".. Denies vaginal bleeding or vaginal dryness. Has been on weight loss due to weight gain with Zoloft use for depression. Down 10 pounds. Sees Rheumatology [facility] clinic for monitoring of Lupus, not currently active. Sees Urgent care if needed. Will be establishing  With PCP care again at some point.[facility] for platelet management (history of low platelets), and Nephrologist for kidney issue to monitor only. Has not had any issues with kidney disease that she is aware at this point.   Patient's last menstrual period was 08/22/2008.          Sexually active: Yes.    The current method of family planning is post menopausal status.    Exercising: Yes.    walking Smoker:  no  Health Maintenance: Pap:  02-20-15 neg HPV HR neg, 03-25-17 neg HPV HR neg declines today History of Abnormal Pap: yes MMG:  See imaging 2018 Self Breast exams: no Colonoscopy: 2018 f/u 33yrs, endoscopy done also BMD:  03/2010 normal per patient TDaP: 09/2007  Shingles: no Pneumonia: done with pcp yrs ago Hep C and HIV: both neg 2017 Labs:  If needed   reports that she has never smoked. She has never used smokeless tobacco. She reports that she drinks about 2.4 - 3.6 oz of alcohol per week. She reports that she does not use drugs.  Past Medical History:  Diagnosis Date  . Abnormal pap   . Anemia   . Anxiety   . History of lupus ([place]) 1987  . Hypertension   . Renal insufficiency 1988   kidney biopsy 1988 and 1998    Past Surgical History:  Procedure Laterality Date  . BREAST LUMPECTOMY WITH RADIOACTIVE SEED LOCALIZATION Right 08/06/2017   Procedure: RIGHT BREAST LUMPECTOMY WITH RADIOACTIVE SEED LOCALIZATION ERAS PATHWAY;  Surgeon: Excell Seltzer, MD;  Location: [facility];   Service: General;  Laterality: Right;  . BREAST SURGERY  10/00   right breast benign fibroadenoma  . COLONOSCOPY  2012  . COLONOSCOPY WITH PROPOFOL N/A 05/01/2017   Procedure: COLONOSCOPY WITH PROPOFOL;  Surgeon: Christene Lye, MD;  Location: [facility];  Service: Endoscopy;  Laterality: N/A;  . ESOPHAGOGASTRODUODENOSCOPY (EGD) WITH PROPOFOL N/A 05/01/2017   Procedure: ESOPHAGOGASTRODUODENOSCOPY (EGD) WITH PROPOFOL;  Surgeon: Christene Lye, MD;  Location: [facility];  Service: Endoscopy;  Laterality: N/A;  . NASAL SEPTUM SURGERY  2007  . TUBAL LIGATION  1990    Current Outpatient Medications  Medication Sig Dispense Refill  . ALPRAZolam (XANAX) 0.25 MG tablet Take 0.25 mg by mouth as needed.    Marland Kitchen aspirin 81 MG tablet Take 81 mg by mouth daily.    . diazepam (VALIUM) 5 MG tablet Take 5 mg by mouth every 6 (six) hours as needed for anxiety.    . enalapril (VASOTEC) 10 MG tablet Take 10 mg by mouth daily.    Marland Kitchen estrogen, conjugated,-medroxyprogesterone (PREMPRO) 0.3-1.5 MG tablet Take 1 tablet by mouth daily. 84 tablet 3  . hydroxychloroquine (PLAQUENIL) 200 MG tablet Take 200 mg by mouth daily.     . traZODone (DESYREL) 50 MG tablet Take 25 mg by mouth at bedtime.    Marland Kitchen UNABLE TO FIND Take 3 tablets by mouth. Med Name: Vern Claude Vitamin Pak      No current facility-administered medications  for this visit.     Family History  Problem Relation Age of Onset  . Cancer Father        oral cancer  . Cancer Maternal Grandmother        ovarian cancer  . Hypertension Maternal Grandmother   . Diabetes Maternal Grandfather   . Diabetes Paternal Aunt     ROS:  Pertinent items are noted in HPI.  Otherwise, a comprehensive ROS was negative.  Exam:   BP 120/72   Pulse 70   Resp 16   Ht 5' 5.5" (1.664 m)   Wt 181 lb (82.1 kg)   LMP 08/22/2008   BMI 29.66 kg/m  Height: 5' 5.5" (166.4 cm) Ht Readings from Last 3 Encounters:  03/27/18 5' 5.5" (1.664 m)  08/06/17 5\' 6"   (1.676 m)  06/27/17 5\' 7"  (1.702 m)    General appearance: alert, cooperative and appears stated age Head: Normocephalic, without obvious abnormality, atraumatic Neck: no adenopathy, supple, symmetrical, trachea midline and thyroid normal to inspection and palpation Lungs: clear to auscultation bilaterally Breasts: normal appearance, no masses or tenderness, No nipple retraction or dimpling, No nipple discharge or bleeding, No axillary or supraclavicular adenopathy Heart: regular rate and rhythm Abdomen: soft, non-tender; no masses,  no organomegaly Extremities: extremities normal, atraumatic, no cyanosis or edema Skin: Skin color, texture, turgor normal. No rashes or lesions Lymph nodes: Cervical, supraclavicular, and axillary nodes normal. No abnormal inguinal nodes palpated Neurologic: Grossly normal   Pelvic: External genitalia:  no lesions              Urethra:  normal appearing urethra with no masses, tenderness or lesions              Bartholin's and Skene's: normal                 Vagina: normal appearing vagina with normal color and discharge, no lesions              Cervix: no cervical motion tenderness, no lesions and normal appearance              Pap taken: No. Bimanual Exam:  Uterus:  normal size, contour, position, consistency, mobility, non-tender and mid position              Adnexa: normal adnexa and no mass, fullness, tenderness               Rectovaginal: Confirms               Anus:  normal sphincter tone, no lesions  Chaperone present: yes  A:  Well Woman with normal exam  Menopausal on HRT, inconsistent use  Weight loss after discontinuing antidepressant, not symptomatic  Hypertension, Lupus, history of platelet issues, anxiety with MD management stable per patient  Immunization due  Screening labs  P:   Reviewed health and wellness pertinent to exam  Aware of need to advise if vaginal bleeding, discussed risks/benefits/warning signs with HRT use.  Discussed continued use with hypertension history and risks. Patient feels she would like to stop HRT and discussed being supportive of that decision. Discussed finishing her pack as she has been taking twice weekly and stop. Discussed possible hot flashes and night sweats, but should decline if occurring. Does not feel this is a issue for her. Will advise if problems. Rx discontinued.  Encouraged to continue exercise and good diet for better health, discuss with PCP if medication changes needed. Continue follow up with PCP as indicated.  Labs: CMP, Lipid panel, TSH, Vitamin D  Requests Tdap  Pap smear:yes   counseled on breast self exam, mammography screening, menopause, adequate intake of calcium and vitamin D, diet and exercise  return annually or prn  An After Visit Summary was printed and given to the patient.

## 2018-03-27 NOTE — Telephone Encounter (Signed)
Patient called to report she spoke with her rheumatologist and she has not had a recent CMP, vitamin D, lipid panel or thyroid test. She requested orders be put in to get the labs done at a [facility] draw station. Routing to Melvia Heaps, CNM who is already aware of the request.

## 2018-03-27 NOTE — Patient Instructions (Signed)

## 2018-03-27 NOTE — Telephone Encounter (Signed)
This has been done.

## 2018-04-01 ENCOUNTER — Other Ambulatory Visit (HOSPITAL_COMMUNITY)
Admission: RE | Admit: 2018-04-01 | Discharge: 2018-04-01 | Disposition: A | Discharge status: Home / Self Care | Payer: PRIVATE HEALTH INSURANCE | Source: Ambulatory Visit | Attending: Obstetrics & Gynecology | Admitting: Obstetrics & Gynecology

## 2018-04-01 DIAGNOSIS — Z124 Encounter for screening for malignant neoplasm of cervix: Secondary | ICD-10-CM | POA: Diagnosis present

## 2018-04-01 NOTE — Addendum Note (Signed)
Addended by: Regina Eck on: 04/01/2018 08:07 AM   Modules accepted: Orders

## 2018-04-03 LAB — CYTOLOGY - PAP: DIAGNOSIS: NEGATIVE

## 2018-04-08 ENCOUNTER — Telehealth: Payer: Self-pay | Admitting: Certified Nurse Midwife

## 2018-04-08 DIAGNOSIS — Z0000 Encounter for general adult medical examination without abnormal findings: Secondary | ICD-10-CM

## 2018-04-08 NOTE — Telephone Encounter (Signed)
Spoke with patient. Advised all lab orders have been placed again and released to Christine. Patient verified with the site that they have the lab orders and can proceed.  Routing to provider for final review. Patient agreeable to disposition. Will close encounter.

## 2018-04-08 NOTE — Telephone Encounter (Signed)
Patient is at [facility] to have labs drawn and they did not have an order. Patient asked if this could be faxed to [phone]. Patient asked if she could be called when this has been faxed.

## 2018-04-09 ENCOUNTER — Telehealth: Payer: Self-pay

## 2018-04-09 ENCOUNTER — Encounter: Payer: Self-pay | Admitting: Obstetrics and Gynecology

## 2018-04-09 LAB — COMPREHENSIVE METABOLIC PANEL
A/G RATIO: 1.6 (ref 1.2–2.2)
ALBUMIN: 4 g/dL (ref 3.6–4.8)
ALK PHOS: 82 IU/L (ref 39–117)
ALT: 9 IU/L (ref 0–32)
AST: 27 IU/L (ref 0–40)
BILIRUBIN TOTAL: 0.5 mg/dL (ref 0.0–1.2)
BUN / CREAT RATIO: 17 (ref 12–28)
BUN: 18 mg/dL (ref 8–27)
CHLORIDE: 106 mmol/L (ref 96–106)
CO2: 23 mmol/L (ref 20–29)
Calcium: 9.4 mg/dL (ref 8.7–10.3)
Creatinine, Ser: 1.09 mg/dL — ABNORMAL HIGH (ref 0.57–1.00)
GFR calc non Af Amer: 54 mL/min/{1.73_m2} — ABNORMAL LOW (ref >59)
GFR, EST AFRICAN AMERICAN: 62 mL/min/{1.73_m2} (ref >59)
GLOBULIN, TOTAL: 2.5 g/dL (ref 1.5–4.5)
GLUCOSE: 85 mg/dL (ref 65–99)
POTASSIUM: 5 mmol/L (ref 3.5–5.2)
SODIUM: 142 mmol/L (ref 134–144)
TOTAL PROTEIN: 6.5 g/dL (ref 6.0–8.5)

## 2018-04-09 LAB — LIPID PANEL
Chol/HDL Ratio: 2.6 ratio (ref 0.0–4.4)
Cholesterol, Total: 163 mg/dL (ref 100–199)
HDL: 62 mg/dL (ref >39)
LDL Calculated: 90 mg/dL (ref 0–99)
TRIGLYCERIDES: 55 mg/dL (ref 0–149)
VLDL CHOLESTEROL CAL: 11 mg/dL (ref 5–40)

## 2018-04-09 LAB — TSH: TSH: 2.96 u[IU]/mL (ref 0.450–4.500)

## 2018-04-09 LAB — VITAMIN D 25 HYDROXY (VIT D DEFICIENCY, FRACTURES): VIT D 25 HYDROXY: 24.1 ng/mL — AB (ref 30.0–100.0)

## 2018-04-09 NOTE — Telephone Encounter (Signed)
[email or web address]

## 2018-04-09 NOTE — Telephone Encounter (Signed)
-----   Message from Nunzio Cobbs, MD sent at 04/09/2018 12:36 PM EDT ----- This is Dr. Quincy Simmonds reviewing lab results for [name] while she is out of the office.  Please inform patient of results. I am highlighting this result so you know to call the patient.   Her vitamin D level is low and I am recommending supplementing with vitamin D3 800 IU daily.   Her blood chemistries show elevated creatinine at 1.09.   This is improved from her last check 8 months ago in the [facility] Epic system when is measured 1.17.  She will need to see her nephrologist for follow up of this.  Her remaining blood chemistries are normal.   Thyroid function and cholesterol is normal.

## 2018-04-10 NOTE — Telephone Encounter (Signed)
Left message for call back.

## 2018-04-11 ENCOUNTER — Encounter: Payer: Self-pay | Admitting: Hematology and Oncology

## 2018-04-11 NOTE — Telephone Encounter (Signed)
Patient returning call to Joy.  

## 2018-04-11 NOTE — Telephone Encounter (Signed)
Patient notified of results as written by provider 

## 2018-05-16 ENCOUNTER — Encounter: Payer: Self-pay | Admitting: Hematology and Oncology

## 2018-06-09 ENCOUNTER — Other Ambulatory Visit: Payer: Self-pay | Admitting: Obstetrics and Gynecology

## 2018-06-09 ENCOUNTER — Telehealth: Payer: Self-pay | Admitting: Certified Nurse Midwife

## 2018-06-09 DIAGNOSIS — Z1231 Encounter for screening mammogram for malignant neoplasm of breast: Secondary | ICD-10-CM

## 2018-06-09 NOTE — Telephone Encounter (Signed)
Patient need an order for a diagnostic mammogram faxed to [facility].

## 2018-06-09 NOTE — Telephone Encounter (Signed)
Return call to patient. She was able to schedule her screening.  Will close encounter.

## 2018-06-09 NOTE — Telephone Encounter (Signed)
Call to [place] at [place]. Per [place] since pt was not diagnosed with breast cancer, she only needs to have screening mammogram.   Call to patient. She attempted to schedule her yearly screen and was advised at first phone call that she needed diagnostic imaging.   Call again to [facility] imaging and caller states she only needs screening mammogram as R Breast Lumpectomy with seed final path from 08/06/2017 was negative for malignancy.   I asked [facility] to contact the patient directly to schedule her screening as the patient had attempted to schedule her screening and was unable to.

## 2018-06-17 ENCOUNTER — Other Ambulatory Visit: Payer: Self-pay

## 2018-06-17 DIAGNOSIS — K824 Cholesterolosis of gallbladder: Secondary | ICD-10-CM

## 2018-06-18 ENCOUNTER — Encounter: Payer: Self-pay | Admitting: Family Medicine

## 2018-06-18 ENCOUNTER — Other Ambulatory Visit: Payer: Self-pay

## 2018-06-18 ENCOUNTER — Ambulatory Visit: Payer: PRIVATE HEALTH INSURANCE | Admitting: Family Medicine

## 2018-06-18 VITALS — BP 118/71 | HR 68 | Temp 98.3°F | Ht 65.5 in | Wt 181.1 lb

## 2018-06-18 DIAGNOSIS — Z7689 Persons encountering health services in other specified circumstances: Secondary | ICD-10-CM

## 2018-06-18 DIAGNOSIS — D75839 Thrombocytosis, unspecified: Secondary | ICD-10-CM

## 2018-06-18 DIAGNOSIS — F419 Anxiety disorder, unspecified: Secondary | ICD-10-CM

## 2018-06-18 DIAGNOSIS — M3214 Glomerular disease in systemic lupus erythematosus: Secondary | ICD-10-CM

## 2018-06-18 DIAGNOSIS — L03116 Cellulitis of left lower limb: Secondary | ICD-10-CM

## 2018-06-18 DIAGNOSIS — D473 Essential (hemorrhagic) thrombocythemia: Secondary | ICD-10-CM

## 2018-06-18 DIAGNOSIS — I10 Essential (primary) hypertension: Secondary | ICD-10-CM

## 2018-06-18 DIAGNOSIS — E559 Vitamin D deficiency, unspecified: Secondary | ICD-10-CM

## 2018-06-18 DIAGNOSIS — G4700 Insomnia, unspecified: Secondary | ICD-10-CM

## 2018-06-18 NOTE — Progress Notes (Signed)
BP 118/71   Pulse 68   Temp 98.3 F (36.8 C) (Oral)   Ht 5' 5.5" (1.664 m)   Wt 181 lb 2 oz (82.2 kg)   LMP 08/22/2008   SpO2 100%   BMI 29.68 kg/m    Subjective:    Patient ID: [name], female    DOB: January 29, 1956, 62 y.o.   MRN: [id number]  HPI: [name] is a 62 y.o. female  Chief Complaint  Patient presents with  . New Patient (Initial Visit)    pt states she has had red spots on left leg/ pt states went to urgent care and been taking prednizone, doxyciclin, hydrocodone but she has finished them all   Here today to establish care.   Main concern today is an infected bug bite on left leg. Completed doxy and prednisone from UC 8/15 and notes significant improvement but still an area of firmness there. Denies fevers, active drainage, chills, sweats.   Sees a Hematologist to monitor platelets.   Rheumatology monitors her lupus. They had given her xanax after her husband passed away last year. Takes them occasionally when she can't sleep. States this is what works for her, was previously taking her husband's xanax sometimes when needed and wishes to continue on them. Was tried on zoloft sometime the past year or two by another provider but states she gained 200 lb and will never take that kind of medication again.  Sometimes takes trazodone 25 mg when she can't sleep also. Using doterra oils in diffuser which does help.  Also has some valium at home she takes rarely but occasionally for anxiety as well.  Takes tramadol prn from Rheumatologist as well for knee pain from a torn meniscus that grew back wrong years ago - never got it surgically corrected as advised.   Nephrology for lupus nephritis. They handle her BP management.   Went to GYN for [facility] with labs. Vit D deficiency, recommended OTC vit d. Has not started this yet.   Relevant past medical, surgical, family and social history reviewed and updated as indicated. Interim medical history since our  last visit reviewed. Allergies and medications reviewed and updated.  Review of Systems  Per HPI unless specifically indicated above     Objective:    BP 118/71   Pulse 68   Temp 98.3 F (36.8 C) (Oral)   Ht 5' 5.5" (1.664 m)   Wt 181 lb 2 oz (82.2 kg)   LMP 08/22/2008   SpO2 100%   BMI 29.68 kg/m   Wt Readings from Last 3 Encounters:  06/18/18 181 lb 2 oz (82.2 kg)  03/27/18 181 lb (82.1 kg)  12/09/17 185 lb 5 oz (84.1 kg)    Physical Exam  Constitutional: She is oriented to person, place, and time. She appears well-developed and well-nourished. No distress.  HENT:  Head: Atraumatic.  Eyes: Conjunctivae and EOM are normal.  Neck: Normal range of motion. Neck supple.  Cardiovascular: Normal rate and regular rhythm.  Pulmonary/Chest: Effort normal and breath sounds normal.  Musculoskeletal: Normal range of motion.  Neurological: She is alert and oriented to person, place, and time.  Skin: Skin is warm and dry.  Psychiatric: She has a normal mood and affect. Her behavior is normal.  Nursing note and vitals reviewed.   Results for orders placed or performed in visit on 04/08/18  Vitamin D (25 hydroxy)  Result Value Ref Range   Vit D, 25-Hydroxy 24.1 (L) 30.0 -  100.0 ng/mL  TSH  Result Value Ref Range   TSH 2.960 0.450 - 4.500 uIU/mL  Lipid panel  Result Value Ref Range   Cholesterol, Total 163 100 - 199 mg/dL   Triglycerides 55 0 - 149 mg/dL   HDL 62 >39 mg/dL   VLDL Cholesterol Cal 11 5 - 40 mg/dL   LDL Calculated 90 0 - 99 mg/dL   Chol/HDL Ratio 2.6 0.0 - 4.4 ratio  Comprehensive metabolic panel  Result Value Ref Range   Glucose 85 65 - 99 mg/dL   BUN 18 8 - 27 mg/dL   Creatinine, Ser 1.09 (H) 0.57 - 1.00 mg/dL   GFR calc non Af Amer 54 (L) >59 mL/min/1.73   GFR calc Af Amer 62 >59 mL/min/1.73   BUN/Creatinine Ratio 17 12 - 28   Sodium 142 134 - 144 mmol/L   Potassium 5.0 3.5 - 5.2 mmol/L   Chloride 106 96 - 106 mmol/L   CO2 23 20 - 29 mmol/L    Calcium 9.4 8.7 - 10.3 mg/dL   Total Protein 6.5 6.0 - 8.5 g/dL   Albumin 4.0 3.6 - 4.8 g/dL   Globulin, Total 2.5 1.5 - 4.5 g/dL   Albumin/Globulin Ratio 1.6 1.2 - 2.2   Bilirubin Total 0.5 0.0 - 1.2 mg/dL   Alkaline Phosphatase 82 39 - 117 IU/L   AST 27 0 - 40 IU/L   ALT 9 0 - 32 IU/L      Assessment & Plan:   Problem List Items Addressed This Visit      Cardiovascular and Mediastinum   Hypertension    Stable on current regimen. Managed by Nephrologist        Genitourinary   Stage III lupus nephritis (WHO) ([place]) - Primary    Managed by Nephrologist        Hematopoietic and Hemostatic   Thrombocytosis ([place])     Other   Systemic lupus erythematosus ([place])    Managed by Rheumatology, stable      Insomnia    Reviewed use of trazodone prn, advised against using xanax or valium to sleep. Continue sleep hygiene improvements, essential oils, increasing exercise      Vitamin D deficiency    Initiate vit D supplement. Increase dietary intake and sunlight      Anxiety    Recommended counseling, different SSRI. Pt declines both. Discussed importance of not taking other people's medications for safety reasons. Has some amount of both valium and xanax at home, cautioned her to use very rarely and never close together. She is aware that I will not refill these.        Other Visit Diagnoses    Encounter to establish care       Left leg cellulitis       Improving well after doxy and prednisone. Continue warm compresses, neosporin, leg elevation. Return precautions reviewed       Follow up plan: Return if symptoms worsen or fail to improve.

## 2018-06-21 DIAGNOSIS — F419 Anxiety disorder, unspecified: Secondary | ICD-10-CM | POA: Insufficient documentation

## 2018-06-21 DIAGNOSIS — E559 Vitamin D deficiency, unspecified: Secondary | ICD-10-CM | POA: Insufficient documentation

## 2018-06-21 DIAGNOSIS — G4700 Insomnia, unspecified: Secondary | ICD-10-CM | POA: Insufficient documentation

## 2018-06-21 NOTE — Assessment & Plan Note (Signed)
Initiate vit D supplement. Increase dietary intake and sunlight

## 2018-06-21 NOTE — Assessment & Plan Note (Signed)
Recommended counseling, different SSRI. Pt declines both. Discussed importance of not taking other people's medications for safety reasons. Has some amount of both valium and xanax at home, cautioned her to use very rarely and never close together. She is aware that I will not refill these.

## 2018-06-21 NOTE — Assessment & Plan Note (Signed)
Managed by Rheumatology, stable

## 2018-06-21 NOTE — Assessment & Plan Note (Signed)
Reviewed use of trazodone prn, advised against using xanax or valium to sleep. Continue sleep hygiene improvements, essential oils, increasing exercise

## 2018-06-21 NOTE — Assessment & Plan Note (Signed)
Managed by Nephrologist

## 2018-06-21 NOTE — Assessment & Plan Note (Signed)
Stable on current regimen. Managed by Nephrologist

## 2018-07-02 ENCOUNTER — Ambulatory Visit
Admission: RE | Admit: 2018-07-02 | Discharge: 2018-07-02 | Disposition: A | Discharge status: Home / Self Care | Payer: PRIVATE HEALTH INSURANCE | Source: Ambulatory Visit | Attending: Obstetrics and Gynecology | Admitting: Obstetrics and Gynecology

## 2018-07-02 DIAGNOSIS — Z1231 Encounter for screening mammogram for malignant neoplasm of breast: Secondary | ICD-10-CM

## 2018-07-03 ENCOUNTER — Other Ambulatory Visit: Payer: Self-pay | Admitting: Obstetrics and Gynecology

## 2018-07-03 DIAGNOSIS — R928 Other abnormal and inconclusive findings on diagnostic imaging of breast: Secondary | ICD-10-CM

## 2018-07-04 ENCOUNTER — Ambulatory Visit
Admission: RE | Admit: 2018-07-04 | Discharge: 2018-07-04 | Disposition: A | Discharge status: Home / Self Care | Payer: PRIVATE HEALTH INSURANCE | Source: Ambulatory Visit | Attending: General Surgery | Admitting: General Surgery

## 2018-07-04 DIAGNOSIS — K824 Cholesterolosis of gallbladder: Secondary | ICD-10-CM | POA: Insufficient documentation

## 2018-07-07 ENCOUNTER — Other Ambulatory Visit: Payer: Self-pay | Admitting: Obstetrics and Gynecology

## 2018-07-07 ENCOUNTER — Ambulatory Visit
Admission: RE | Admit: 2018-07-07 | Discharge: 2018-07-07 | Disposition: A | Discharge status: Home / Self Care | Payer: PRIVATE HEALTH INSURANCE | Source: Ambulatory Visit | Attending: Obstetrics and Gynecology | Admitting: Obstetrics and Gynecology

## 2018-07-07 DIAGNOSIS — N6310 Unspecified lump in the right breast, unspecified quadrant: Secondary | ICD-10-CM

## 2018-07-07 DIAGNOSIS — R928 Other abnormal and inconclusive findings on diagnostic imaging of breast: Secondary | ICD-10-CM

## 2018-07-09 ENCOUNTER — Ambulatory Visit: Payer: PRIVATE HEALTH INSURANCE

## 2018-07-14 ENCOUNTER — Ambulatory Visit
Admission: RE | Admit: 2018-07-14 | Discharge: 2018-07-14 | Disposition: A | Discharge status: Home / Self Care | Payer: PRIVATE HEALTH INSURANCE | Source: Ambulatory Visit | Attending: Obstetrics and Gynecology | Admitting: Obstetrics and Gynecology

## 2018-07-14 DIAGNOSIS — N6310 Unspecified lump in the right breast, unspecified quadrant: Secondary | ICD-10-CM

## 2018-07-17 ENCOUNTER — Ambulatory Visit: Payer: PRIVATE HEALTH INSURANCE

## 2018-07-28 ENCOUNTER — Telehealth: Payer: Self-pay | Admitting: *Deleted

## 2018-07-28 NOTE — Telephone Encounter (Signed)
Left message for patient to call the office back regarding her appointment. She is scheduled for 07/29/18 for f/u gallbladder US, pt did not have [place] and needs to get that rescheduled

## 2018-07-29 ENCOUNTER — Ambulatory Visit (INDEPENDENT_AMBULATORY_CARE_PROVIDER_SITE_OTHER): Payer: PRIVATE HEALTH INSURANCE | Admitting: General Surgery

## 2018-07-29 ENCOUNTER — Encounter: Payer: Self-pay | Admitting: General Surgery

## 2018-07-29 VITALS — BP 136/72 | HR 74 | Temp 97.3°F | Resp 18 | Ht 66.0 in | Wt 178.8 lb

## 2018-07-29 DIAGNOSIS — K824 Cholesterolosis of gallbladder: Secondary | ICD-10-CM | POA: Diagnosis not present

## 2018-07-29 NOTE — Progress Notes (Signed)
Patient ID: [name], female   DOB: 1956/10/10, 62 y.o.   MRN: [id number]  Chief Complaint  Patient presents with  . Follow-up    Gallbladder results    HPI [name] is a 62 y.o. female.  Here to discuss ultrasound results of the gallbladder.  She tries to avoid corn and caffeine (Pepsi).  She has been to the walk in clinic for a sore on her left lower leg which has healed. She now has a bite mark above that area which is red. She works part time at [employer]. Husband passed away last year.  HPI  Past Medical History:  Diagnosis Date  . Abnormal pap   . Anemia   . Anxiety   . History of lupus ([place]) 1987  . Hypertension   . Low vitamin D level   . Renal insufficiency 1988   kidney biopsy 1988 and 1998    Past Surgical History:  Procedure Laterality Date  . BREAST BIOPSY Right 07/08/2017  . BREAST EXCISIONAL BIOPSY Right 08/06/2017  . BREAST EXCISIONAL BIOPSY Right 07/1999  . BREAST LUMPECTOMY WITH RADIOACTIVE SEED LOCALIZATION Right 08/06/2017   Procedure: RIGHT BREAST LUMPECTOMY WITH RADIOACTIVE SEED LOCALIZATION ERAS PATHWAY;  Surgeon: Excell Seltzer, MD;  Location: [facility];  Service: General;  Laterality: Right;  . BREAST SURGERY  10/00   right breast benign fibroadenoma  . COLONOSCOPY  2012  . COLONOSCOPY WITH PROPOFOL N/A 05/01/2017   Procedure: COLONOSCOPY WITH PROPOFOL;  Surgeon: Christene Lye, MD;  Location: [facility];  Service: Endoscopy;  Laterality: N/A;  . CRYOTHERAPY     abnormal pap smear  . ESOPHAGOGASTRODUODENOSCOPY (EGD) WITH PROPOFOL N/A 05/01/2017   Procedure: ESOPHAGOGASTRODUODENOSCOPY (EGD) WITH PROPOFOL;  Surgeon: Christene Lye, MD;  Location: [facility];  Service: Endoscopy;  Laterality: N/A;  . NASAL SEPTUM SURGERY  2007  . TUBAL LIGATION  1990    Family History  Problem Relation Age of Onset  . Cancer Father        oral cancer  . Cancer Maternal Grandmother        ovarian cancer   . Hypertension Maternal Grandmother   . Diabetes Maternal Grandfather   . Diabetes Paternal Aunt   . Breast cancer Neg Hx     Social History Social History   Tobacco Use  . Smoking status: Never Smoker  . Smokeless tobacco: Never Used  Substance Use Topics  . Alcohol use: Yes    Alcohol/week: 4.0 - 6.0 standard drinks    Types: 4 - 6 Standard drinks or equivalent per week  . Drug use: No    Allergies  Allergen Reactions  . Sulfur Hives  . Sulfa Antibiotics Itching and Rash    Current Outpatient Medications  Medication Sig Dispense Refill  . ALPRAZolam (XANAX) 0.25 MG tablet Take 0.25 mg by mouth as needed.    Marland Kitchen aspirin 81 MG tablet Take 81 mg by mouth daily.    . diazepam (VALIUM) 5 MG tablet Take 5 mg by mouth every 6 (six) hours as needed for anxiety.    . enalapril (VASOTEC) 10 MG tablet Take 10 mg by mouth daily.    Marland Kitchen estrogen, conjugated,-medroxyprogesterone (PREMPRO) 0.3-1.5 MG tablet Take 1 tablet by mouth daily.    . hydroxychloroquine (PLAQUENIL) 200 MG tablet Take 200 mg by mouth every other day.     . traMADol (ULTRAM) 50 MG tablet Take 50 mg by mouth every 6 (six) hours as needed for moderate pain.    Marland Kitchen  traZODone (DESYREL) 50 MG tablet Take 25 mg by mouth at bedtime.    Marland Kitchen UNABLE TO FIND Take 3 tablets by mouth. Med Name: Do Terra Vitamin Pak     . bacitracin-polymyxin b (POLYSPORIN) ointment Apply topically 2 (two) times daily. 30 g 0  . doxycycline (VIBRA-TABS) 100 MG tablet Take 1 tablet (100 mg total) by mouth 2 (two) times daily. 20 tablet 0   No current facility-administered medications for this visit.     Review of Systems Review of Systems  Constitutional: Negative.   Respiratory: Negative.   Neurological: Negative.     Blood pressure 136/72, pulse 74, temperature (!) 97.3 F (36.3 C), temperature source Temporal, resp. rate 18, height 5\' 6"  (1.676 m), weight 178 lb 12.8 oz (81.1 kg), last menstrual period 08/22/2008.  Physical Exam Physical  Exam  Constitutional: She is oriented to person, place, and time. She appears well-developed and well-nourished.  HENT:  Mouth/Throat: Oropharynx is clear and moist.  Eyes: No scleral icterus.  Neck: Neck supple.  Cardiovascular: Normal rate, regular rhythm and normal heart sounds.  Pulmonary/Chest: Effort normal and breath sounds normal.  Abdominal: Soft. Normal appearance and bowel sounds are normal. There is no tenderness.  Musculoskeletal:       Legs: Lymphadenopathy:    She has no cervical adenopathy.  Neurological: She is alert and oriented to person, place, and time.  Skin: Skin is warm and dry.  Right lower leg irritation and redness    Data Reviewed Abdominal ultrasound of July 04, 2018 in part revealed:  Again demonstrated are multiple small gallbladder polyps, measuring up to 5 mm in maximum diameter each. These are similar to the previous examination. No gallstones, wall thickening or pericholecystic fluid. No sonographic Murphy sign.  Assessment    Stable gallbladder polyps.  5 mm or less.  No indication for operative intervention.    Plan    May apply neosporin and warm compresses to the right lower leg sore. Dermatology is symptoms persist. Final follow up in one year with abdominal ultrasound prior to office visit.      HPI, Physical Exam, Assessment and Plan have been scribed under the direction and in the presence of Kristine Bellow, MD. Kristine Fetch, RN  I have completed the exam and reviewed the above documentation for accuracy and completeness.  I agree with the above.  Haematologist has been used and any errors in dictation or transcription are unintentional.  [name], M.D., F.A.C.S.  [name] [name] 07/30/2018, 8:27 PM

## 2018-07-29 NOTE — Patient Instructions (Addendum)
The patient is aware to call back for any questions or new concerns.  May apply neosporin and warm compresses to the right lower leg sore. Dermatology is symptoms persist.  Follow up in one year with abdominal ultrasound prior to office visit

## 2018-07-30 ENCOUNTER — Ambulatory Visit (INDEPENDENT_AMBULATORY_CARE_PROVIDER_SITE_OTHER): Payer: PRIVATE HEALTH INSURANCE | Admitting: Family Medicine

## 2018-07-30 ENCOUNTER — Encounter: Payer: Self-pay | Admitting: Family Medicine

## 2018-07-30 VITALS — BP 106/66 | HR 97 | Temp 98.9°F | Wt 177.1 lb

## 2018-07-30 DIAGNOSIS — L03115 Cellulitis of right lower limb: Secondary | ICD-10-CM | POA: Diagnosis not present

## 2018-07-30 DIAGNOSIS — K824 Cholesterolosis of gallbladder: Secondary | ICD-10-CM | POA: Insufficient documentation

## 2018-07-30 MED ORDER — BACITRACIN-POLYMYXIN B 500-10000 UNIT/GM EX OINT: TOPICAL_OINTMENT | Freq: Two times a day (BID) | CUTANEOUS | 0 refills | Status: DC

## 2018-07-30 MED ORDER — DOXYCYCLINE HYCLATE 100 MG PO TABS: 100.0000 mg | ORAL_TABLET | Freq: Two times a day (BID) | ORAL | 0 refills | Status: DC

## 2018-07-30 NOTE — Progress Notes (Signed)
BP 106/66   Pulse 97   Temp 98.9 F (37.2 C) (Oral)   Wt 177 lb 1.6 oz (80.3 kg)   LMP 08/22/2008   SpO2 99%   BMI 28.58 kg/m    Subjective:    Patient ID: [name], female    DOB: 11-08-1955, 62 y.o.   MRN: [id number]  HPI: [name] is a 62 y.o. female  Chief Complaint  Patient presents with  . Insect Bite    Infected on R front calf. Bit on Friday   Here today for infected insect bites on right shin that she first noticed Friday. Since has become red, swollen, painful, and draining. Using warm compresses and keeping area covered. No fevers, chills, sweats. Has had several bug bites become infected like this over the past 6 months.   Relevant past medical, surgical, family and social history reviewed and updated as indicated. Interim medical history since our last visit reviewed. Allergies and medications reviewed and updated.  Review of Systems  Per HPI unless specifically indicated above     Objective:    BP 106/66   Pulse 97   Temp 98.9 F (37.2 C) (Oral)   Wt 177 lb 1.6 oz (80.3 kg)   LMP 08/22/2008   SpO2 99%   BMI 28.58 kg/m   Wt Readings from Last 3 Encounters:  07/30/18 177 lb 1.6 oz (80.3 kg)  07/29/18 178 lb 12.8 oz (81.1 kg)  06/18/18 181 lb 2 oz (82.2 kg)    Physical Exam  Constitutional: She is oriented to person, place, and time. She appears well-developed and well-nourished. No distress.  Eyes: Conjunctivae and EOM are normal.  Neck: Normal range of motion. Neck supple.  Cardiovascular: Normal rate and regular rhythm.  Pulmonary/Chest: Effort normal and breath sounds normal.  Musculoskeletal: Normal range of motion.  Neurological: She is alert and oriented to person, place, and time.  Skin: Skin is warm and dry. There is erythema (4 in round area of erythema and edema right anterior lower extremity, ttp. no active drainage).  Psychiatric: She has a normal mood and affect. Her behavior is normal.  Nursing note and vitals  reviewed.   Results for orders placed or performed in visit on 04/08/18  Vitamin D (25 hydroxy)  Result Value Ref Range   Vit D, 25-Hydroxy 24.1 (L) 30.0 - 100.0 ng/mL  TSH  Result Value Ref Range   TSH 2.960 0.450 - 4.500 uIU/mL  Lipid panel  Result Value Ref Range   Cholesterol, Total 163 100 - 199 mg/dL   Triglycerides 55 0 - 149 mg/dL   HDL 62 >39 mg/dL   VLDL Cholesterol Cal 11 5 - 40 mg/dL   LDL Calculated 90 0 - 99 mg/dL   Chol/HDL Ratio 2.6 0.0 - 4.4 ratio  Comprehensive metabolic panel  Result Value Ref Range   Glucose 85 65 - 99 mg/dL   BUN 18 8 - 27 mg/dL   Creatinine, Ser 1.09 (H) 0.57 - 1.00 mg/dL   GFR calc non Af Amer 54 (L) >59 mL/min/1.73   GFR calc Af Amer 62 >59 mL/min/1.73   BUN/Creatinine Ratio 17 12 - 28   Sodium 142 134 - 144 mmol/L   Potassium 5.0 3.5 - 5.2 mmol/L   Chloride 106 96 - 106 mmol/L   CO2 23 20 - 29 mmol/L   Calcium 9.4 8.7 - 10.3 mg/dL   Total Protein 6.5 6.0 - 8.5 g/dL   Albumin 4.0 3.6 - 4.8  g/dL   Globulin, Total 2.5 1.5 - 4.5 g/dL   Albumin/Globulin Ratio 1.6 1.2 - 2.2   Bilirubin Total 0.5 0.0 - 1.2 mg/dL   Alkaline Phosphatase 82 39 - 117 IU/L   AST 27 0 - 40 IU/L   ALT 9 0 - 32 IU/L      Assessment & Plan:   Problem List Items Addressed This Visit    None    Visit Diagnoses    Cellulitis of right lower extremity    -  Primary   Tx with doxycycline, warm compresses, antibiotic ointment. Wash twice daily and use nonstick gauze. Elevate leg when at rest       Follow up plan: Return if symptoms worsen or fail to improve.

## 2018-07-30 NOTE — Patient Instructions (Signed)
Follow up as needed

## 2018-08-05 ENCOUNTER — Ambulatory Visit: Payer: Self-pay | Admitting: General Surgery

## 2018-08-05 DIAGNOSIS — D241 Benign neoplasm of right breast: Secondary | ICD-10-CM

## 2018-08-08 ENCOUNTER — Other Ambulatory Visit: Payer: Self-pay | Admitting: General Surgery

## 2018-08-08 DIAGNOSIS — D241 Benign neoplasm of right breast: Secondary | ICD-10-CM

## 2018-08-11 NOTE — Pre-Procedure Instructions (Signed)
ARCADIA GORGAS  08/11/2018      [id number] - [place], [place] - [address] [place] [address] Phone: [phone] Fax: [phone]    Your procedure is scheduled on Tuesday, Oct. 29th   Report to [facility] Admitting at 8:00 AM             (posted surgery time 10a - 11a)   Call this number if you have problems the morning of surgery:  [phone]   Remember:   Do not eat any foods after midnight, Monday.   You may drink clear liquids until 0700. (3 hrs prior to surgery time)    Clear liquids allowed are:    Water, Juice (non-citric and without pulp), Clear Tea, Black Coffee only and Gatorade    Take these medicines the morning of surgery with A SIP OF WATER : Xanax OR Valium, Tramadol.    Do not wear jewelry, make-up or nail polish.  Do not wear lotions, powders, perfumes, or deodorant.  Do not shave 48 hours prior to surgery.    Do not bring valuables to the hospital.  [facility] is not responsible for any belongings or valuables.  Contacts, dentures or bridgework may not be worn into surgery.  Leave your suitcase in the car.  After surgery it may be brought to your room.  For patients admitted to the hospital, discharge time will be determined by your treatment team.  Patients discharged the day of surgery will not be allowed to drive home, and will need someone to stay with you for the first 24 hrs.  Please read over the following fact sheets that you were given. Pain Booklet and Surgical Site Infection Prevention       [facility]- Preparing For Surgery  Before surgery, you can play an important role. Because skin is not sterile, your skin needs to be as free of germs as possible. You can reduce the number of germs on your skin by washing with CHG (chlorahexidine gluconate) Soap before surgery.  CHG is an antiseptic cleaner which kills germs and bonds with the skin to continue killing germs even after washing.    Oral  Hygiene is also important to reduce your risk of infection.    Remember - BRUSH YOUR TEETH THE MORNING OF SURGERY WITH YOUR REGULAR TOOTHPASTE  Please do not use if you have an allergy to CHG or antibacterial soaps. If your skin becomes reddened/irritated stop using the CHG.  Do not shave (including legs and underarms) for at least 48 hours prior to first CHG shower. It is OK to shave your face.  Please follow these instructions carefully.   1. Shower the NIGHT BEFORE SURGERY and the MORNING OF SURGERY with CHG.   2. If you chose to wash your hair, wash your hair first as usual with your normal shampoo.  3. After you shampoo, rinse your hair and body thoroughly to remove the shampoo.  4. Use CHG as you would any other liquid soap. You can apply CHG directly to the skin and wash gently with a scrungie or a clean washcloth.   5. Apply the CHG Soap to your body ONLY FROM THE NECK DOWN.  Do not use on open wounds or open sores. Avoid contact with your eyes, ears, mouth and genitals (private parts). Wash Face and genitals (private parts)  with your normal soap.  6. Wash thoroughly, paying special attention to the area where your  surgery will be performed.  7. Thoroughly rinse your body with warm water from the neck down.  8. DO NOT shower/wash with your normal soap after using and rinsing off the CHG Soap.  9. Pat yourself dry with a CLEAN TOWEL.  10. Wear CLEAN PAJAMAS to bed the night before surgery, wear comfortable clothes the morning of surgery  11. Place CLEAN SHEETS on your bed the night of your first shower and DO NOT SLEEP WITH PETS.  Day of Surgery:  Do not apply any deodorants/lotions.  Please wear clean clothes to the hospital/surgery center.    Remember to brush your teeth WITH YOUR REGULAR TOOTHPASTE.

## 2018-08-12 ENCOUNTER — Encounter (HOSPITAL_COMMUNITY)
Admission: RE | Admit: 2018-08-12 | Discharge: 2018-08-12 | Disposition: A | Discharge status: Home / Self Care | Payer: 59 | Source: Ambulatory Visit | Attending: General Surgery | Admitting: General Surgery

## 2018-08-12 ENCOUNTER — Encounter (HOSPITAL_COMMUNITY): Payer: Self-pay

## 2018-08-12 ENCOUNTER — Other Ambulatory Visit: Payer: Self-pay

## 2018-08-12 DIAGNOSIS — Z01812 Encounter for preprocedural laboratory examination: Secondary | ICD-10-CM | POA: Diagnosis not present

## 2018-08-12 DIAGNOSIS — Z01810 Encounter for preprocedural cardiovascular examination: Secondary | ICD-10-CM | POA: Insufficient documentation

## 2018-08-12 DIAGNOSIS — I10 Essential (primary) hypertension: Secondary | ICD-10-CM | POA: Insufficient documentation

## 2018-08-12 LAB — BASIC METABOLIC PANEL
ANION GAP: 8 (ref 5–15)
BUN: 17 mg/dL (ref 8–23)
CALCIUM: 9.1 mg/dL (ref 8.9–10.3)
CO2: 22 mmol/L (ref 22–32)
CREATININE: 1.04 mg/dL — AB (ref 0.44–1.00)
Chloride: 110 mmol/L (ref 98–111)
GFR, EST NON AFRICAN AMERICAN: 56 mL/min — AB (ref >60)
Glucose, Bld: 98 mg/dL (ref 70–99)
Potassium: 3.8 mmol/L (ref 3.5–5.1)
SODIUM: 140 mmol/L (ref 135–145)

## 2018-08-12 LAB — CBC
HCT: 33.3 % — ABNORMAL LOW (ref 36.0–46.0)
Hemoglobin: 10.5 g/dL — ABNORMAL LOW (ref 12.0–15.0)
MCH: 30.9 pg (ref 26.0–34.0)
MCHC: 31.5 g/dL (ref 30.0–36.0)
MCV: 97.9 fL (ref 80.0–100.0)
NRBC: 0 % (ref 0.0–0.2)
PLATELETS: 538 10*3/uL — AB (ref 150–400)
RBC: 3.4 MIL/uL — AB (ref 3.87–5.11)
RDW: 14.4 % (ref 11.5–15.5)
WBC: 9.6 10*3/uL (ref 4.0–10.5)

## 2018-08-12 NOTE — Pre-Procedure Instructions (Signed)
[name]  08/12/2018      [id number] - [place], [place] - [address] [place] [address] Phone: [phone] Fax: [phone]    Your procedure is scheduled on Tuesday, Oct. 29th   Report to [facility] Admitting at 8:00 AM             (posted surgery time 10a - 11a)   Call this number if you have problems the morning of surgery:  [phone]   Remember:   Do not eat any foods after midnight, Monday.   You may drink clear liquids until 0700. (3 hrs prior to surgery time)    Clear liquids allowed are:    Water, Juice (non-citric and without pulp), Clear Tea, Black Coffee only and Gatorade    Take these medicines the morning of surgery with A SIP OF WATER :   Plaquenil (Hydroxychlorquinine) Xanax OR Valium if needed Tramadol if needed  7 days prior to surgery STOP taking any Aspirin products (unless otherwise instructed by your surgeon), Aleve, Naproxen, Ibuprofen, Motrin, Advil, Goody's, BC's, all herbal medications, fish oil, and all vitamins  FOLLOW your surgeon's instructions on stopping Aspirin. If no instructions given, please call your surgeons office.      Do not wear jewelry, make-up or nail polish.  Do not wear lotions, powders, perfumes, or deodorant.  Do not shave 48 hours prior to surgery.    Do not bring valuables to the hospital.  [facility] is not responsible for any belongings or valuables.  Contacts, dentures or bridgework may not be worn into surgery.  Leave your suitcase in the car.  After surgery it may be brought to your room.  For patients admitted to the hospital, discharge time will be determined by your treatment team.  Patients discharged the day of surgery will not be allowed to drive home, and will need someone to stay with you for the first 24 hrs.  Please read over the following fact sheets that you were given. Pain Booklet and Surgical Site Infection Prevention       [facility]-  Preparing For Surgery  Before surgery, you can play an important role. Because skin is not sterile, your skin needs to be as free of germs as possible. You can reduce the number of germs on your skin by washing with CHG (chlorahexidine gluconate) Soap before surgery.  CHG is an antiseptic cleaner which kills germs and bonds with the skin to continue killing germs even after washing.    Oral Hygiene is also important to reduce your risk of infection.    Remember - BRUSH YOUR TEETH THE MORNING OF SURGERY WITH YOUR REGULAR TOOTHPASTE  Please do not use if you have an allergy to CHG or antibacterial soaps. If your skin becomes reddened/irritated stop using the CHG.  Do not shave (including legs and underarms) for at least 48 hours prior to first CHG shower. It is OK to shave your face.  Please follow these instructions carefully.   1. Shower the NIGHT BEFORE SURGERY and the MORNING OF SURGERY with CHG.   2. If you chose to wash your hair, wash your hair first as usual with your normal shampoo.  3. After you shampoo, rinse your hair and body thoroughly to remove the shampoo.  4. Use CHG as you would any other liquid soap. You can apply CHG directly to the skin and wash gently with a scrungie or a clean washcloth.  5. Apply the CHG Soap to your body ONLY FROM THE NECK DOWN.  Do not use on open wounds or open sores. Avoid contact with your eyes, ears, mouth and genitals (private parts). Wash Face and genitals (private parts)  with your normal soap.  6. Wash thoroughly, paying special attention to the area where your surgery will be performed.  7. Thoroughly rinse your body with warm water from the neck down.  8. DO NOT shower/wash with your normal soap after using and rinsing off the CHG Soap.  9. Pat yourself dry with a CLEAN TOWEL.  10. Wear CLEAN PAJAMAS to bed the night before surgery, wear comfortable clothes the morning of surgery  11. Place CLEAN SHEETS on your bed the night of your  first shower and DO NOT SLEEP WITH PETS.  Day of Surgery:  Do not apply any deodorants/lotions.  Please wear clean clothes to the hospital/surgery center.    Remember to brush your teeth WITH YOUR REGULAR TOOTHPASTE.

## 2018-08-12 NOTE — Progress Notes (Signed)
PCP - Merrie Roof Cardiologist - denies  EKG - 08/12/2018  Stress Test - >10 yr ago, normal per pt, maybe at [place] [facility]?   Aspirin Instructions: 5 days prior to surgery   Patient denies shortness of breath, fever, cough and chest pain at PAT appointment   Patient verbalized understanding of instructions that were given to them at the PAT appointment. Patient was also instructed that they will need to review over the PAT instructions again at home before surgery.

## 2018-08-13 NOTE — Progress Notes (Signed)
Anesthesia Chart Review:  Case:  [id number] Date/Time:  08/19/18 0945   Procedure:  RIGHT BREAST LUMPECTOMY WITH RADIOACTIVE SEED LOCALIZATION ERAS PATHWAY (Right )   Anesthesia type:  General   Pre-op diagnosis:  papilloma and ADH right breast   Location:  [facility] OR ROOM 02 / [facility]   Surgeon:  Excell Seltzer, MD      DISCUSSION: 62 yo female never smoker. Pertinent hx includes Anemia, HTN, Anxiety, Renal insufficiency 2/2 Lupus nephritis, SLE.  I saw pt at PAT to evaluate insect bite on right anterior lower leg. She was seen by her PCP 07/30/2018 for an abscess secondary to insect bite. Treated with PO antibiotics and warm compresses. On exam today there is a small circular area of erythema with tiny central scab. Appears to be healing well. No drainage, no signs of infection. Pt reports it is feeling much better. I advised that if it continues to heal there will be no issue with surgery. If she develops any new drainage or signs of infection she will return to PCP and let Dr. Excell Seltzer know.  VS: BP (!) 144/54   Pulse 88   Temp 36.7 C   Resp 18   Ht 5\' 6"  (1.676 m)   Wt 81.2 kg   LMP 08/22/2008   SpO2 100%   BMI 28.91 kg/m   PROVIDERS: Volney American, PA-C is PCP  Marian Sorrow, MD is Nephrologist  Percell Boston, MD is Rheumatologist   LABS: Labs reviewed: Acceptable for surgery. (all labs ordered are listed, but only abnormal results are displayed)  Labs Reviewed  BASIC METABOLIC PANEL - Abnormal; Notable for the following components:      Result Value   Creatinine, Ser 1.04 (*)    GFR calc non Af Amer 56 (*)    All other components within normal limits  CBC - Abnormal; Notable for the following components:   RBC 3.40 (*)    Hemoglobin 10.5 (*)    HCT 33.3 (*)    Platelets 538 (*)    All other components within normal limits     IMAGES: N/A   EKG: 08/12/2018: Sinus rhythm with Premature atrial complexes. Rate 77.  CV: N/A   Past Medical History:   Diagnosis Date  . Abnormal pap   . Anemia   . Anxiety   . History of lupus ([place]) 1987  . Hypertension   . Low vitamin D level   . Renal insufficiency 1988   kidney biopsy 1988 and 1998- "my kidneys function at like 48%"    Past Surgical History:  Procedure Laterality Date  . BREAST BIOPSY Right 07/08/2017  . BREAST EXCISIONAL BIOPSY Right 08/06/2017  . BREAST EXCISIONAL BIOPSY Right 07/1999  . BREAST LUMPECTOMY WITH RADIOACTIVE SEED LOCALIZATION Right 08/06/2017   Procedure: RIGHT BREAST LUMPECTOMY WITH RADIOACTIVE SEED LOCALIZATION ERAS PATHWAY;  Surgeon: Excell Seltzer, MD;  Location: [facility];  Service: General;  Laterality: Right;  . BREAST SURGERY  10/00   right breast benign fibroadenoma  . COLONOSCOPY  2012  . COLONOSCOPY WITH PROPOFOL N/A 05/01/2017   Procedure: COLONOSCOPY WITH PROPOFOL;  Surgeon: Christene Lye, MD;  Location: [facility] ENDOSCOPY;  Service: Endoscopy;  Laterality: N/A;  . CRYOTHERAPY     abnormal pap smear  . ESOPHAGOGASTRODUODENOSCOPY (EGD) WITH PROPOFOL N/A 05/01/2017   Procedure: ESOPHAGOGASTRODUODENOSCOPY (EGD) WITH PROPOFOL;  Surgeon: Christene Lye, MD;  Location: [facility] ENDOSCOPY;  Service: Endoscopy;  Laterality: N/A;  . NASAL SEPTUM SURGERY  2007  .  TUBAL LIGATION  1990    MEDICATIONS: . ALPRAZolam (XANAX) 0.25 MG tablet  . aspirin 81 MG tablet  . bacitracin-polymyxin b (POLYSPORIN) ointment  . enalapril (VASOTEC) 10 MG tablet  . hydroxychloroquine (PLAQUENIL) 200 MG tablet  . Neomy-Bacit-Polymyx-Pramoxine ([employer] ANTIBIOTIC PAIN/SCAR EX)  . traMADol (ULTRAM) 50 MG tablet  . traZODone (DESYREL) 50 MG tablet   No current facility-administered medications for this encounter.      Wynonia Musty [facility] Short Stay Center/Anesthesiology Phone [phone] 08/13/2018 11:55 AM

## 2018-08-14 ENCOUNTER — Ambulatory Visit
Admission: RE | Admit: 2018-08-14 | Discharge: 2018-08-14 | Disposition: A | Discharge status: Home / Self Care | Payer: PRIVATE HEALTH INSURANCE | Source: Ambulatory Visit | Attending: General Surgery | Admitting: General Surgery

## 2018-08-14 DIAGNOSIS — D241 Benign neoplasm of right breast: Secondary | ICD-10-CM

## 2018-08-18 NOTE — H&P (Signed)
History of Present Illness Marland Kitchen T. Kaileen Bronkema MD; 08/05/2018 4:16 PM) The patient is a 62 year old female who presents with a breast mass. Patient is a 62 year old female referred by Dr. Dorise Bullion for recent abnormal mammogram and diagnosis of papilloma associated with ADH. The patient reports a remote history of a benign fibroadenoma being removed about the year 2000. She is exactly 1 year following excision of a benign papilloma from the 12:00 periareolar right breast by me. On yearly mammogram a new mass was noted at the 9 o'clock position middle depth right breast. Subsequent diagnostic mammogram and ultrasound has revealed a 1.4 x 0.8 x 0.7 cm lobulated mixed echogenicity mass. Large core needle biopsy was recommended and performed which has revealed atypical ductal hyperplasia involving an intraductal papilloma. She has not noted any lump or other breast symptoms.   Problem List/Past Medical Marland Kitchen T. Simran Bomkamp, MD; 08/05/2018 4:16 PM) PAPILLOMA OF RIGHT BREAST (D24.1)  RIGHT UPPER QUADRANT ABDOMINAL PAIN (R10.11)  POSTOP CHECK (W09)   Past Surgical History Marland Kitchen T. Valgene Deloatch, MD; 08/05/2018 4:16 PM) Breast Mass; Local Excision  Right.  Diagnostic Studies History Marland Kitchen T. Wyndham Santilli, MD; 08/05/2018 4:16 PM) Colonoscopy  within last year Mammogram  within last year Pap Smear  1-5 years ago  Allergies Geni Bers Haggett, RMA; 08/05/2018 3:52 PM) Sulfa Antibiotics  Itching, Rash. Allergies Reconciled   Medication History [employer], RMA; 08/05/2018 3:52 PM) ALPRAZolam (0.25MG  Tablet, Oral) Active. Enalapril Maleate (10MG  Tablet, Oral) Active. Prempro (0.3-1.5MG  Tablet, Oral) Active. TraZODone HCl (50MG  Tablet, Oral) Active. Hydroxychloroquine Sulfate (200MG  Tablet, Oral) Active. PriLOSEC (20MG  Capsule DR, Oral) Active. TraMADol HCl (50MG  Tablet, Oral) Active. Doxycycline Hyclate (100MG  Capsule, Oral) Active. Medications  Reconciled  Social History Marland Kitchen T. Dontea Corlew, MD; 08/05/2018 4:16 PM) Alcohol use  Moderate alcohol use. Caffeine use  Carbonated beverages, Coffee, Tea. Illicit drug use  Remotely quit drug use. Tobacco use  Former smoker.  Family History Marland Kitchen T. Ainsley Sanguinetti, MD; 08/05/2018 4:16 PM) Alcohol Abuse  Father. Cancer  Father. Cerebrovascular Accident  Mother. Kidney Disease  Mother.  Pregnancy / Birth History Marland Kitchen T. Carr Shartzer, MD; 08/05/2018 4:16 PM) Age at menarche  51 years. Age of menopause  <44 Contraceptive History  Oral contraceptives. Gravida  1 Length (months) of breastfeeding  3-6 Maternal age  52-30 Para  1  Other Problems Marland Kitchen T. Megin Consalvo, MD; 08/05/2018 4:16 PM) Back Pain  High blood pressure  Other disease, cancer, significant illness   Vitals Geni Bers Haggett RMA; 08/05/2018 3:53 PM) 08/05/2018 3:52 PM Weight: 177.8 lb Height: 66in Body Surface Area: 1.9 m Body Mass Index: 28.7 kg/m  Pain Level: 0/10 Temp.: 95.55F(Temporal)  Pulse: 77 (Regular)  P.OX: 100% (Room air) BP: 142/78 (Sitting, Left Arm, Standard)       Physical Exam Marland Kitchen T. Miria Cappelli MD; 08/05/2018 4:17 PM) The physical exam findings are as follows: Note:General: Thin female in no distress Skin: No rash or infection Lungs: Clear easy respirations Cardiac: Regular rate and rhythm area no edema. Lymph nodes: No cervical, subclavicular or axillary nodes palpable Breasts: I can feel a small mass at the 9 o'clock position right breast near an old incision. Possibly postbiopsy change. No other palpable masses in either breast. Extremities: No edema Neurologic: Alert and oriented. Gait normal.    Assessment & Plan Marland Kitchen T. Siarra Gilkerson MD; 08/05/2018 4:20 PM) PAPILLOMA OF RIGHT BREAST (D24.1) ATYPICAL DUCTAL HYPERPLASIA OF RIGHT BREAST (N60.91) Impression: Patient known to me from previous excision of benign papilloma 12 o'clock position  right breast one  year ago. Remote fibroadenoma right breast. Now new mass 9 o'clock position right breast just over 1 cm with core biopsy showing atypical ductal hyperplasia involving a small papilloma. I discussed the findings in detail with the patient. We discussed options for observation versus excision but particularly with ADH in association with a papilloma I would lean toward excision and this is clearly what she prefers. We discussed the indications and nature of procedure, use of radioactive seed, risks of anesthetic complications, bleeding, infection or wound healing problems. All her questions were answered. Current Plans Radioactive seed localized right breast lumpectomy under general anesthesia as an outpatient

## 2018-08-19 ENCOUNTER — Ambulatory Visit
Admission: RE | Admit: 2018-08-19 | Discharge: 2018-08-19 | Disposition: A | Discharge status: Home / Self Care | Payer: PRIVATE HEALTH INSURANCE | Source: Ambulatory Visit | Attending: General Surgery | Admitting: General Surgery

## 2018-08-19 ENCOUNTER — Ambulatory Visit (HOSPITAL_COMMUNITY): Payer: 59 | Admitting: Physician Assistant

## 2018-08-19 ENCOUNTER — Ambulatory Visit (HOSPITAL_COMMUNITY)
Admission: RE | Admit: 2018-08-19 | Discharge: 2018-08-19 | Disposition: A | Discharge status: Home / Self Care | Payer: 59 | Source: Ambulatory Visit | Attending: General Surgery | Admitting: General Surgery

## 2018-08-19 ENCOUNTER — Ambulatory Visit (HOSPITAL_COMMUNITY): Payer: 59 | Admitting: Anesthesiology

## 2018-08-19 ENCOUNTER — Encounter (HOSPITAL_COMMUNITY)
Admission: RE | Disposition: A | Discharge status: Home / Self Care | Payer: Self-pay | Source: Ambulatory Visit | Attending: General Surgery

## 2018-08-19 ENCOUNTER — Encounter (HOSPITAL_COMMUNITY): Payer: Self-pay | Admitting: Anesthesiology

## 2018-08-19 DIAGNOSIS — D241 Benign neoplasm of right breast: Secondary | ICD-10-CM | POA: Insufficient documentation

## 2018-08-19 DIAGNOSIS — F329 Major depressive disorder, single episode, unspecified: Secondary | ICD-10-CM | POA: Insufficient documentation

## 2018-08-19 DIAGNOSIS — N6011 Diffuse cystic mastopathy of right breast: Secondary | ICD-10-CM | POA: Insufficient documentation

## 2018-08-19 DIAGNOSIS — Z7982 Long term (current) use of aspirin: Secondary | ICD-10-CM | POA: Insufficient documentation

## 2018-08-19 DIAGNOSIS — Z882 Allergy status to sulfonamides status: Secondary | ICD-10-CM | POA: Insufficient documentation

## 2018-08-19 DIAGNOSIS — N6091 Unspecified benign mammary dysplasia of right breast: Secondary | ICD-10-CM | POA: Insufficient documentation

## 2018-08-19 DIAGNOSIS — F419 Anxiety disorder, unspecified: Secondary | ICD-10-CM | POA: Diagnosis not present

## 2018-08-19 DIAGNOSIS — I10 Essential (primary) hypertension: Secondary | ICD-10-CM | POA: Insufficient documentation

## 2018-08-19 DIAGNOSIS — N6021 Fibroadenosis of right breast: Secondary | ICD-10-CM | POA: Insufficient documentation

## 2018-08-19 DIAGNOSIS — Z79899 Other long term (current) drug therapy: Secondary | ICD-10-CM | POA: Diagnosis not present

## 2018-08-19 HISTORY — PX: BREAST LUMPECTOMY WITH RADIOACTIVE SEED LOCALIZATION: SHX6424

## 2018-08-19 SURGERY — BREAST LUMPECTOMY WITH RADIOACTIVE SEED LOCALIZATION
Anesthesia: General | Site: Breast | Laterality: Right

## 2018-08-19 MED ORDER — CHLORHEXIDINE GLUCONATE CLOTH 2 % EX PADS: 6.0000 | MEDICATED_PAD | Freq: Once | CUTANEOUS | Status: DC

## 2018-08-19 MED ORDER — BUPIVACAINE-EPINEPHRINE 0.25% -1:200000 IJ SOLN
INTRAMUSCULAR | Status: DC | PRN
  Administered 2018-08-19: 20 mL

## 2018-08-19 MED ORDER — CEFAZOLIN SODIUM-DEXTROSE 2-4 GM/100ML-% IV SOLN
INTRAVENOUS | Status: AC
  Filled 2018-08-19: qty 100

## 2018-08-19 MED ORDER — ACETAMINOPHEN 500 MG PO TABS
ORAL_TABLET | ORAL | Status: AC
  Administered 2018-08-19: 1000 mg via ORAL
  Filled 2018-08-19: qty 2

## 2018-08-19 MED ORDER — LIDOCAINE 2% (20 MG/ML) 5 ML SYRINGE
INTRAMUSCULAR | Status: DC | PRN
  Administered 2018-08-19: 100 mg via INTRAVENOUS

## 2018-08-19 MED ORDER — CEFAZOLIN SODIUM-DEXTROSE 2-4 GM/100ML-% IV SOLN
2.0000 g | INTRAVENOUS | Status: AC
  Administered 2018-08-19: 2 g via INTRAVENOUS

## 2018-08-19 MED ORDER — MIDAZOLAM HCL 2 MG/2ML IJ SOLN
INTRAMUSCULAR | Status: DC | PRN
  Administered 2018-08-19: 2 mg via INTRAVENOUS

## 2018-08-19 MED ORDER — ONDANSETRON HCL 4 MG/2ML IJ SOLN
INTRAMUSCULAR | Status: DC | PRN
  Administered 2018-08-19: 4 mg via INTRAVENOUS

## 2018-08-19 MED ORDER — GABAPENTIN 300 MG PO CAPS
300.0000 mg | ORAL_CAPSULE | ORAL | Status: AC
  Administered 2018-08-19: 300 mg via ORAL

## 2018-08-19 MED ORDER — FENTANYL CITRATE (PF) 250 MCG/5ML IJ SOLN
INTRAMUSCULAR | Status: DC | PRN
  Administered 2018-08-19: 100 ug via INTRAVENOUS

## 2018-08-19 MED ORDER — DEXAMETHASONE SODIUM PHOSPHATE 10 MG/ML IJ SOLN
INTRAMUSCULAR | Status: DC | PRN
  Administered 2018-08-19: 10 mg via INTRAVENOUS

## 2018-08-19 MED ORDER — 0.9 % SODIUM CHLORIDE (POUR BTL) OPTIME
TOPICAL | Status: DC | PRN
  Administered 2018-08-19: 1000 mL

## 2018-08-19 MED ORDER — SCOPOLAMINE 1 MG/3DAYS TD PT72
1.0000 | MEDICATED_PATCH | TRANSDERMAL | Status: DC
  Administered 2018-08-19: 1.5 mg via TRANSDERMAL
  Filled 2018-08-19: qty 1

## 2018-08-19 MED ORDER — PROMETHAZINE HCL 25 MG/ML IJ SOLN: 6.2500 mg | INTRAMUSCULAR | Status: DC | PRN

## 2018-08-19 MED ORDER — GABAPENTIN 300 MG PO CAPS
ORAL_CAPSULE | ORAL | Status: AC
  Administered 2018-08-19: 300 mg via ORAL
  Filled 2018-08-19: qty 1

## 2018-08-19 MED ORDER — ACETAMINOPHEN 500 MG PO TABS
1000.0000 mg | ORAL_TABLET | ORAL | Status: AC
  Administered 2018-08-19: 1000 mg via ORAL

## 2018-08-19 MED ORDER — BUPIVACAINE-EPINEPHRINE 0.25% -1:200000 IJ SOLN
INTRAMUSCULAR | Status: AC
  Filled 2018-08-19: qty 1

## 2018-08-19 MED ORDER — LIDOCAINE 2% (20 MG/ML) 5 ML SYRINGE
INTRAMUSCULAR | Status: AC
  Filled 2018-08-19: qty 5

## 2018-08-19 MED ORDER — LACTATED RINGERS IV SOLN
INTRAVENOUS | Status: DC | PRN
  Administered 2018-08-19: 10:00:00 via INTRAVENOUS

## 2018-08-19 MED ORDER — FENTANYL CITRATE (PF) 100 MCG/2ML IJ SOLN: 25.0000 ug | INTRAMUSCULAR | Status: DC | PRN

## 2018-08-19 MED ORDER — FENTANYL CITRATE (PF) 250 MCG/5ML IJ SOLN
INTRAMUSCULAR | Status: AC
  Filled 2018-08-19: qty 5

## 2018-08-19 MED ORDER — MIDAZOLAM HCL 2 MG/2ML IJ SOLN
INTRAMUSCULAR | Status: AC
  Filled 2018-08-19: qty 2

## 2018-08-19 MED ORDER — PROPOFOL 10 MG/ML IV BOLUS
INTRAVENOUS | Status: DC | PRN
  Administered 2018-08-19: 200 mg via INTRAVENOUS

## 2018-08-19 MED ORDER — OXYCODONE HCL 5 MG PO TABS: 5.0000 mg | ORAL_TABLET | Freq: Four times a day (QID) | ORAL | 0 refills | Status: DC | PRN

## 2018-08-19 SURGICAL SUPPLY — 45 items
ADH SKN CLS APL DERMABOND .7 (GAUZE/BANDAGES/DRESSINGS) ×1
BINDER BREAST XLRG (GAUZE/BANDAGES/DRESSINGS) ×1 IMPLANT
BLADE SURG 15 STRL LF DISP TIS (BLADE) ×1 IMPLANT
BLADE SURG 15 STRL SS (BLADE) ×2
CANISTER SUCT 3000ML PPV (MISCELLANEOUS) ×2 IMPLANT
CHLORAPREP W/TINT 26ML (MISCELLANEOUS) ×2 IMPLANT
CLIP VESOCCLUDE SM WIDE 6/CT (CLIP) ×2 IMPLANT
COVER PROBE W GEL 5X96 (DRAPES) ×2 IMPLANT
COVER SURGICAL LIGHT HANDLE (MISCELLANEOUS) ×2 IMPLANT
DERMABOND ADVANCED (GAUZE/BANDAGES/DRESSINGS) ×1
DERMABOND ADVANCED .7 DNX12 (GAUZE/BANDAGES/DRESSINGS) ×1 IMPLANT
DEVICE DUBIN SPECIMEN MAMMOGRA (MISCELLANEOUS) ×2 IMPLANT
DRAPE CHEST BREAST 15X10 FENES (DRAPES) ×2 IMPLANT
DRAPE UTILITY XL STRL (DRAPES) ×2 IMPLANT
ELECT COATED BLADE 2.86 ST (ELECTRODE) ×2 IMPLANT
ELECT REM PT RETURN 9FT ADLT (ELECTROSURGICAL) ×2
ELECTRODE REM PT RTRN 9FT ADLT (ELECTROSURGICAL) ×1 IMPLANT
GLOVE BIOGEL PI IND STRL 6.5 (GLOVE) IMPLANT
GLOVE BIOGEL PI IND STRL 7.0 (GLOVE) ×1 IMPLANT
GLOVE BIOGEL PI IND STRL 8 (GLOVE) ×1 IMPLANT
GLOVE BIOGEL PI INDICATOR 6.5 (GLOVE) ×1
GLOVE BIOGEL PI INDICATOR 7.0 (GLOVE) ×1
GLOVE BIOGEL PI INDICATOR 8 (GLOVE) ×1
GLOVE ECLIPSE 7.5 STRL STRAW (GLOVE) ×4 IMPLANT
GLOVE SURG SS PI 6.0 STRL IVOR (GLOVE) ×1 IMPLANT
GOWN STRL REUS W/ TWL LRG LVL3 (GOWN DISPOSABLE) ×1 IMPLANT
GOWN STRL REUS W/ TWL XL LVL3 (GOWN DISPOSABLE) ×1 IMPLANT
GOWN STRL REUS W/TWL LRG LVL3 (GOWN DISPOSABLE) ×2
GOWN STRL REUS W/TWL XL LVL3 (GOWN DISPOSABLE) ×2
KIT BASIN OR (CUSTOM PROCEDURE TRAY) ×2 IMPLANT
KIT MARKER MARGIN INK (KITS) ×2 IMPLANT
NDL HYPO 25GX1X1/2 BEV (NEEDLE) ×1 IMPLANT
NEEDLE HYPO 25GX1X1/2 BEV (NEEDLE) ×2 IMPLANT
NS IRRIG 1000ML POUR BTL (IV SOLUTION) ×2 IMPLANT
PACK SURGICAL SETUP 50X90 (CUSTOM PROCEDURE TRAY) ×2 IMPLANT
PAD ABD 8X10 STRL (GAUZE/BANDAGES/DRESSINGS) ×1 IMPLANT
PENCIL BUTTON HOLSTER BLD 10FT (ELECTRODE) ×2 IMPLANT
SPONGE LAP 18X18 X RAY DECT (DISPOSABLE) ×2 IMPLANT
SUT MON AB 5-0 PS2 18 (SUTURE) ×2 IMPLANT
SUT VIC AB 3-0 SH 18 (SUTURE) ×2 IMPLANT
SYR BULB 3OZ (MISCELLANEOUS) ×2 IMPLANT
SYR CONTROL 10ML LL (SYRINGE) ×2 IMPLANT
TOWEL GREEN STERILE FF (TOWEL DISPOSABLE) ×1 IMPLANT
TUBE CONNECTING 12X1/4 (SUCTIONS) ×2 IMPLANT
YANKAUER SUCT BULB TIP NO VENT (SUCTIONS) ×2 IMPLANT

## 2018-08-19 NOTE — Discharge Instructions (Signed)
[place] Office Phone Number [phone]  BREAST BIOPSY/ PARTIAL MASTECTOMY: POST OP INSTRUCTIONS  Always review your discharge instruction sheet given to you by the facility where your surgery was performed.  IF YOU HAVE DISABILITY OR FAMILY LEAVE FORMS, YOU MUST BRING THEM TO THE OFFICE FOR PROCESSING.  DO NOT GIVE THEM TO YOUR DOCTOR.  1. A prescription for pain medication may be given to you upon discharge.  Take your pain medication as prescribed, if needed.  If narcotic pain medicine is not needed, then you may take acetaminophen (Tylenol) or ibuprofen (Advil) as needed. 2. Take your usually prescribed medications unless otherwise directed 3. If you need a refill on your pain medication, please contact your pharmacy.  They will contact our office to request authorization.  Prescriptions will not be filled after 5pm or on week-ends. 4. You should eat very light the first 24 hours after surgery, such as soup, crackers, pudding, etc.  Resume your normal diet the day after surgery. 5. Most patients will experience some swelling and bruising in the breast.  Ice packs and a good support bra will help.  Swelling and bruising can take several days to resolve.  6. It is common to experience some constipation if taking pain medication after surgery.  Increasing fluid intake and taking a stool softener will usually help or prevent this problem from occurring.  A mild laxative (Milk of Magnesia or Miralax) should be taken according to package directions if there are no bowel movements after 48 hours. 7. Unless discharge instructions indicate otherwise, you may remove your bandages 24-48 hours after surgery, and you may shower at that time.  You may have steri-strips (small skin tapes) in place directly over the incision.  These strips should be left on the skin for 7-10 days.  If your surgeon used skin glue on the incision, you may shower in 24 hours.  The glue will flake off over the  next 2-3 weeks.  Any sutures or staples will be removed at the office during your follow-up visit. 8. ACTIVITIES:  You may resume regular daily activities (gradually increasing) beginning the next day.  Wearing a good support bra or sports bra minimizes pain and swelling.  You may have sexual intercourse when it is comfortable. a. You may drive when you no longer are taking prescription pain medication, you can comfortably wear a seatbelt, and you can safely maneuver your car and apply brakes. b. RETURN TO WORK:  ______________________________________________________________________________________ 9. You should see your doctor in the office for a follow-up appointment approximately two weeks after your surgery.  Your doctors nurse will typically make your follow-up appointment when she calls you with your pathology report.  Expect your pathology report 2-3 business days after your surgery.  You may call to check if you do not hear from [place] after three days. 10. OTHER INSTRUCTIONS: _____________________________ 11.  12.  No  lifting over 5 pounds for 2 weeks following surgery __________________________________________________________________ _____________________________________________________________________________________________________________________________________ _____________________________________________________________________________________________________________________________________ _____________________________________________________________________________________________________________________________________  WHEN TO CALL YOUR DOCTOR: 1. Fever over 101.0 2. Nausea and/or vomiting. 3. Extreme swelling or bruising. 4. Continued bleeding from incision. 5. Increased pain, redness, or drainage from the incision.  The clinic staff is available to answer your questions during regular business hours.  Please dont hesitate to call and ask to speak to one of the nurses for  clinical concerns.  If you have a medical emergency, go to the nearest emergency room or call 911.  A Psychologist, sport and exercise from [facility]  Surgery is always on call at the hospital.  For further questions, please visit [email or web address]

## 2018-08-19 NOTE — Transfer of Care (Signed)
Immediate Anesthesia Transfer of Care Note  Patient: [name]  Procedure(s) Performed: RIGHT BREAST LUMPECTOMY WITH RADIOACTIVE SEED LOCALIZATION ERAS PATHWAY (Right Breast)  Patient Location: PACU  Anesthesia Type:General  Level of Consciousness: awake, alert , drowsy and patient cooperative  Airway & Oxygen Therapy: Patient Spontanous Breathing and Patient connected to nasal cannula oxygen  Post-op Assessment: Report given to RN and Post -op Vital signs reviewed and stable  Post vital signs: Reviewed and stable  Last Vitals:  Vitals Value Taken Time  BP    Temp    Pulse 86 08/19/2018 10:47 AM  Resp 13 08/19/2018 10:47 AM  SpO2 100 % 08/19/2018 10:47 AM  Vitals shown include unvalidated device data.  Last Pain:  Vitals:   08/19/18 0900  PainSc: 2          Complications: No apparent anesthesia complications

## 2018-08-19 NOTE — Anesthesia Procedure Notes (Signed)
Procedure Name: LMA Insertion Date/Time: 08/19/2018 10:03 AM Performed by: Renato Shin, CRNA Pre-anesthesia Checklist: Patient identified, Emergency Drugs available, Suction available and Patient being monitored Patient Re-evaluated:Patient Re-evaluated prior to induction Oxygen Delivery Method: Circle system utilized Preoxygenation: Pre-oxygenation with 100% oxygen Induction Type: IV induction LMA: LMA inserted LMA Size: 4.0 Number of attempts: 2 Placement Confirmation: positive ETCO2,  CO2 detector and breath sounds checked- equal and bilateral Tube secured with: Tape Dental Injury: Teeth and Oropharynx as per pre-operative assessment

## 2018-08-19 NOTE — Anesthesia Preprocedure Evaluation (Signed)
Anesthesia Evaluation  Patient identified by MRN, date of birth, ID band Patient awake    Reviewed: Allergy & Precautions, NPO status , Patient's Chart, lab work & pertinent test results  History of Anesthesia Complications Negative for: history of anesthetic complications  Airway Mallampati: II  TM Distance: >3 FB Neck ROM: Full    Dental no notable dental hx. (+) Dental Advisory Given   Pulmonary neg pulmonary ROS,    Pulmonary exam normal        Cardiovascular hypertension, Pt. on medications Normal cardiovascular exam     Neuro/Psych PSYCHIATRIC DISORDERS Anxiety Depression negative neurological ROS     GI/Hepatic negative GI ROS, Neg liver ROS,   Endo/Other  negative endocrine ROS  Renal/GU Renal InsufficiencyRenal disease  negative genitourinary   Musculoskeletal negative musculoskeletal ROS (+)   Abdominal   Peds negative pediatric ROS (+)  Hematology negative hematology ROS (+)   Anesthesia Other Findings Day of surgery medications reviewed with the patient.  Reproductive/Obstetrics negative OB ROS                             Anesthesia Physical  Anesthesia Plan  ASA: III  Anesthesia Plan: General   Post-op Pain Management:    Induction: Intravenous  PONV Risk Score and Plan: 4 or greater and Ondansetron, Dexamethasone, Scopolamine patch - Pre-op, Treatment may vary due to age or medical condition and Diphenhydramine  Airway Management Planned: LMA  Additional Equipment:   Intra-op Plan:   Post-operative Plan: Extubation in OR  Informed Consent: I have reviewed the patients History and Physical, chart, labs and discussed the procedure including the risks, benefits and alternatives for the proposed anesthesia with the patient or authorized representative who has indicated his/her understanding and acceptance.   Dental advisory given  Plan Discussed with: CRNA,  Anesthesiologist and Surgeon  Anesthesia Plan Comments:         Anesthesia Quick Evaluation

## 2018-08-19 NOTE — Anesthesia Postprocedure Evaluation (Signed)
Anesthesia Post Note  Patient: [name]  Procedure(s) Performed: RIGHT BREAST LUMPECTOMY WITH RADIOACTIVE SEED LOCALIZATION ERAS PATHWAY (Right Breast)     Patient location during evaluation: PACU Anesthesia Type: General Level of consciousness: sedated Pain management: pain level controlled Vital Signs Assessment: post-procedure vital signs reviewed and stable Respiratory status: spontaneous breathing and respiratory function stable Cardiovascular status: stable Postop Assessment: no apparent nausea or vomiting Anesthetic complications: no    Last Vitals:  Vitals:   08/19/18 1115 08/19/18 1130  BP: 127/61 112/63  Pulse: 67 77  Resp: 16   Temp:    SpO2: 100% 98%    Last Pain:  Vitals:   08/19/18 1130  PainSc: 0-No pain                 Anabell Swint DANIEL

## 2018-08-19 NOTE — Interval H&P Note (Signed)
History and Physical Interval Note:  08/19/2018 9:17 AM  [name]  has presented today for surgery, with the diagnosis of papilloma and ADH right breast  The various methods of treatment have been discussed with the patient and family. After consideration of risks, benefits and other options for treatment, the patient has consented to  Procedure(s): RIGHT BREAST LUMPECTOMY WITH RADIOACTIVE SEED LOCALIZATION ERAS PATHWAY (Right) as a surgical intervention .  The patient's history has been reviewed, patient examined, no change in status, stable for surgery.  I have reviewed the patient's chart and labs.  Questions were answered to the patient's satisfaction.     Darene Lamer Eaven Schwager

## 2018-08-19 NOTE — Op Note (Signed)
Preoperative Diagnosis: papilloma and ADH right breast  Postoprative Diagnosis: papilloma and ADH right breast  Procedure: Procedure(s): RIGHT BREAST LUMPECTOMY WITH RADIOACTIVE SEED LOCALIZATION ERAS PATHWAY   Surgeon: Excell Seltzer T   Assistants: None  Anesthesia:  General LMA anesthesia  Indications: Patient known to me from previous excision of benign papilloma 12 o'clock position right breast one year ago. Remote fibroadenoma right breast. Now new mass 9 o'clock position right breast just over 1 cm with core biopsy showing atypical ductal hyperplasia involving a small papilloma.  After preoperative discussion regarding alternatives and risks detailed elsewhere we have elected to proceed with radioactive seed localized right breast lumpectomy.    Procedure Detail: Patient had previously undergone accurate placement of a radioactive seed at the tumor and clip site in the inferior right breast.  Seed placement was confirmed preoperatively.  She was taken to the operating room, placed in the supine position on the operating table, and laryngeal mask general anesthesia induced.  She received preoperative IV antibiotics.  PAS were placed.  The right breast was widely sterilely prepped and draped.  Patient timeout was performed and correct procedure verified. The seed was localized just off the areolar border inferiorly.  I made a curvilinear incision at the areolar border and dissection was carried down to the subcutaneous tissue.  Short skin and subcutaneous flap was raised inferiorly.  Using the neoprobe for guidance I excised an approximately 1-1/2 to 2 cm globular specimen of breast tissue around the seed.  This was inked for margins.  Specimen x-ray showed the seed and marking clip centrally located within the specimen.  Soft tissue was extensively infiltrated with Marcaine.  Complete hemostasis was obtained.  The lumpectomy cavity was marked with clips.  Deep breast and subtenons tissue  was closed with interrupted 3-0 Vicryl and the skin with running subcuticular 5-0 Monocryl and Dermabond.  Sponge needle and instrument counts were correct.    Findings: As above  Estimated Blood Loss:  Minimal         Drains: None  Blood Given: none          Specimens: Right breast lumpectomy        Complications:  * No complications entered in OR log *         Disposition: PACU - hemodynamically stable.         Condition: stable

## 2018-08-20 ENCOUNTER — Encounter (HOSPITAL_COMMUNITY): Payer: Self-pay | Admitting: General Surgery

## 2018-12-04 ENCOUNTER — Encounter: Payer: Self-pay | Admitting: Hematology and Oncology

## 2018-12-07 NOTE — Progress Notes (Signed)
[facility] day:  12/08/2018    Chief Complaint: [name] is a 63 y.o. female with systemic lupus erythematosis (SLE), chronic thrombocytosis and monocytosis who is seen for 1 year assessment.  HPI:  The patient was last seen in the hematology clinic on 12/09/2017.  At that time, she denied any complaint.  Exam was unremarkable.  Hematocrit was 35.1.  Platelet count was 609,000.  She had persistent monocytosis (1300).  Work-up on 12/18/2017 at [facility] was negative for JAK2 V617F, exon 12-15, and BCR-ABL.  Flow for PNH was inadvertantly performed and was negative.  She was last seen by Dr. Jefm Bryant on 01/21/2018.  She had quiescent lupus.  Plaquenil dosing changed to every other day.   She was seen by Dr. Marian Sorrow, nephrology, on 06/18/2018.  Creatinine was 1.2.  Urine sediment was negative.  [facility] labs on 12/03/2018 revealed a hematocrit of 34.9, hemoglobin 11.8, MCV 93, platelets 595,000, WBC 8300 with an ANC of 3700.  Monocyte count was 1300.  Creatinine was 1.2.  Albumin was 4.6.  LFTs were normal.  During the interim, patient is feeling "pretty good". She denies any acute concerns today. Patient denies that she has experienced any B symptoms. She recently treated with a course of Augmentin for a sinus infection. She has not experienced any nausea, vomiting, or changes to her bowel habits. Arthritic pain associated with weather changes.   Patient advises that she maintains an adequate appetite. She is eating well. Patient actively trying to lost weight by diet and lifestyle modification. Weight today is 173 lb 13.3 oz (78.8 kg), which compared to her last visit to the clinic, represents a 12 pound weight loss.    Patient denies pain in the clinic today.   Past Medical History:  Diagnosis Date  . Abnormal pap   . Anemia   . Anxiety   . History of lupus ([place]) 1987  . Hypertension   . Low vitamin D level   . Renal  insufficiency 1988   kidney biopsy 1988 and 1998- "my kidneys function at like 48%"    Past Surgical History:  Procedure Laterality Date  . BREAST BIOPSY Right 07/08/2017  . BREAST EXCISIONAL BIOPSY Right 08/06/2017  . BREAST EXCISIONAL BIOPSY Right 07/1999  . BREAST LUMPECTOMY WITH RADIOACTIVE SEED LOCALIZATION Right 08/06/2017   Procedure: RIGHT BREAST LUMPECTOMY WITH RADIOACTIVE SEED LOCALIZATION ERAS PATHWAY;  Surgeon: Excell Seltzer, MD;  Location: [place];  Service: General;  Laterality: Right;  . BREAST LUMPECTOMY WITH RADIOACTIVE SEED LOCALIZATION Right 08/19/2018   Procedure: RIGHT BREAST LUMPECTOMY WITH RADIOACTIVE SEED LOCALIZATION ERAS PATHWAY;  Surgeon: Excell Seltzer, MD;  Location: [place];  Service: General;  Laterality: Right;  . BREAST SURGERY  10/00   right breast benign fibroadenoma  . COLONOSCOPY  2012  . COLONOSCOPY WITH PROPOFOL N/A 05/01/2017   Procedure: COLONOSCOPY WITH PROPOFOL;  Surgeon: Christene Lye, MD;  Location: [facility];  Service: Endoscopy;  Laterality: N/A;  . CRYOTHERAPY     abnormal pap smear  . ESOPHAGOGASTRODUODENOSCOPY (EGD) WITH PROPOFOL N/A 05/01/2017   Procedure: ESOPHAGOGASTRODUODENOSCOPY (EGD) WITH PROPOFOL;  Surgeon: Christene Lye, MD;  Location: [facility];  Service: Endoscopy;  Laterality: N/A;  . NASAL SEPTUM SURGERY  2007  . TUBAL LIGATION  1990    Family History  Problem Relation Age of Onset  . Cancer Father        oral cancer  . Cancer Maternal Grandmother  ovarian cancer  . Hypertension Maternal Grandmother   . Diabetes Maternal Grandfather   . Diabetes Paternal Aunt   . Breast cancer Neg Hx     Social History:  reports that she has never smoked. She has never used smokeless tobacco. She reports current alcohol use of about 4.0 - 6.0 standard drinks of alcohol per week. She reports current drug use. Drug: Marijuana.  Her husband had stage IIIB lung cancer.  He died  12/10/16.  She has a part time job. The patient is alone today.    Allergies:  Allergies  Allergen Reactions  . Sulfa Antibiotics Hives, Itching and Rash  . Sulfur Hives    Current Medications: Current Outpatient Medications  Medication Sig Dispense Refill  . ALPRAZolam (XANAX) 0.25 MG tablet Take 0.25 mg by mouth daily as needed for anxiety.     Marland Kitchen aspirin 81 MG tablet Take 81 mg by mouth daily.    . enalapril (VASOTEC) 10 MG tablet Take 10 mg by mouth daily.    . hydroxychloroquine (PLAQUENIL) 200 MG tablet Take 200 mg by mouth every other day.     Marland Kitchen Neomy-Bacit-Polymyx-Pramoxine ([employer] ANTIBIOTIC PAIN/SCAR EX) Apply 1 application topically 2 (two) times daily.    Marland Kitchen oxyCODONE (OXY IR/ROXICODONE) 5 MG immediate release tablet Take 1 tablet (5 mg total) by mouth every 6 (six) hours as needed for moderate pain, severe pain or breakthrough pain. (Patient not taking: Reported on 12/08/2018) 12 tablet 0  . traMADol (ULTRAM) 50 MG tablet Take 50 mg by mouth every 6 (six) hours as needed for moderate pain.    . traZODone (DESYREL) 50 MG tablet Take 50 mg by mouth at bedtime as needed for sleep.      No current facility-administered medications for this visit.     Review of Systems:  GENERAL:  Feels "pretty good".  Walks daily.  No fevers, sweats.  Weight loss of 12 pounds in past year (intentional). PERFORMANCE STATUS (ECOG):  1 HEENT:  Interval sinus infection.  No visual changes, runny nose, sore throat, mouth sores or tenderness. Lungs: No shortness of breath or cough.  No hemoptysis. Cardiac:  No chest pain, palpitations, orthopnea, or PND. GI:  No nausea, vomiting, diarrhea, constipation, melena or hematochezia. GU:  No urgency, frequency, dysuria, or hematuria. Musculoskeletal:  No back pain.  Arthritis.  Right knee aches when cold and rainy.  No muscle tenderness. Extremities:  No pain or swelling. Skin:  No rashes or skin changes. Neuro:  No headache, numbness or weakness, balance  or coordination issues. Endocrine:  No diabetes, thyroid issues, hot flashes or night sweats. Psych:  No mood changes, depression or anxiety. Pain:  No focal pain. Review of systems:  All other systems reviewed and found to be negative.   Physical Exam: Blood pressure 134/65, pulse 70, temperature 98.6 F (37 C), temperature source Tympanic, resp. rate 18, height '5\' 6"'  (1.676 m), weight 173 lb 13.3 oz (78.8 kg), last menstrual period 08/22/2008, SpO2 100 %. GENERAL:  Well developed, well nourished, woman sitting comfortably in the exam room in no acute distress. MENTAL STATUS:  Alert and oriented to person, place and time. HEAD:  Short brown hair with highlights.  Normocephalic, atraumatic, face symmetric, no Cushingoid features. EYES:  Glasses.  Blue eyes.  Pupils equal round and reactive to light and accomodation.  No conjunctivitis or scleral icterus. ENT:  Oropharynx clear without lesion.  Tongue normal. Mucous membranes moist.  RESPIRATORY:  Clear to auscultation without rales, wheezes  or rhonchi. CARDIOVASCULAR:  Regular rate and rhythm without murmur, rub or gallop. ABDOMEN:  Soft, non-tender, with active bowel sounds, and no hepatosplenomegaly.  No masses. SKIN:  No rashes, ulcers or lesions. EXTREMITIES: No edema, no skin discoloration or tenderness.  No palpable cords. LYMPH NODES: No palpable cervical, supraclavicular, axillary or inguinal adenopathy  NEUROLOGICAL: Unremarkable. PSYCH:  Appropriate.    No visits with results within 3 Day(s) from this visit.  Latest known visit with results is:  Hospital Outpatient Visit on 08/12/2018  Component Date Value Ref Range Status  . Sodium 08/12/2018 140  135 - 145 mmol/L Final  . Potassium 08/12/2018 3.8  3.5 - 5.1 mmol/L Final  . Chloride 08/12/2018 110  98 - 111 mmol/L Final  . CO2 08/12/2018 22  22 - 32 mmol/L Final  . Glucose, Bld 08/12/2018 98  70 - 99 mg/dL Final  . BUN 08/12/2018 17  8 - 23 mg/dL Final  . Creatinine, Ser  08/12/2018 1.04* 0.44 - 1.00 mg/dL Final  . Calcium 08/12/2018 9.1  8.9 - 10.3 mg/dL Final  . GFR calc non Af Amer 08/12/2018 56* >60 mL/min Final  . GFR calc Af Amer 08/12/2018 >60  >60 mL/min Final   Comment: (NOTE) The eGFR has been calculated using the CKD EPI equation. This calculation has not been validated in all clinical situations. eGFR's persistently <60 mL/min signify possible Chronic Kidney Disease.   Georgiann Hahn gap 08/12/2018 8  5 - 15 Final   Performed at [facility], [place] [address] [place], [place] [address]  . WBC 08/12/2018 9.6  4.0 - 10.5 K/uL Final  . RBC 08/12/2018 3.40* 3.87 - 5.11 MIL/uL Final  . Hemoglobin 08/12/2018 10.5* 12.0 - 15.0 g/dL Final  . HCT 08/12/2018 33.3* 36.0 - 46.0 % Final  . MCV 08/12/2018 97.9  80.0 - 100.0 fL Final  . MCH 08/12/2018 30.9  26.0 - 34.0 pg Final  . MCHC 08/12/2018 31.5  30.0 - 36.0 g/dL Final  . RDW 08/12/2018 14.4  11.5 - 15.5 % Final  . Platelets 08/12/2018 538* 150 - 400 K/uL Final  . nRBC 08/12/2018 0.0  0.0 - 0.2 % Final   Performed at [facility] [address] [place], [place] [address]    Assessment:  MAREESA GATHRIGHT is a 63 y.o. female with chronic thrombocytosis and monocytosis since 2012.  She may have an early myeloproliferative disorder such as CMML without current need for treatment.  She has a mild normocytic anemia.   Work-up in 20012 revealed a normal LDH, BCR-ABL, JAK2 V617F mutation, ESR, and iron studies.  CALR mutation and MPL from [facility] on 07/16/2017 were negative.  Work-up on 12/18/2017 at [facility] was negative for JAK2 V617F, exon 12-15, and BCR-ABL.  Flow for PNH was inadvertantly performed and was negative.  She has a history of systemic lupus erythematosis (SLE) complicated by class III lupus nephritis.  Creatinine was 1.2 on 06/13/2018.  She is on Plaquenil 200 mg QOD.  ANA was negative on 01/21/2018.  C3 (116) and C4 (28) were normal on 01/21/2018.  She has early arthritis.  Her last  colonoscopy was in 2009 or 2013.  Diet is fair.  Symptomatically, she feels "good".  She denies any B symptoms.  Exam reveals no adenopathy or hepatosplenomegaly.  Hemoglobin is 11.8.  Platelet count is 595,000.  Monocyte count is 1300.  Plan: 1.  Review interval labs.  2.  Chronic thrombocytosis and monocytosis  Etiology of  monocytosis is unclear.  She has had a persistent monocyte count > 1000.    Work-up for a myeloproliferative disorder has been negative (JAK2, CALR, MPL, BCR-ABL).    Lab slip today for flow cytometry (assess for blasts), ESR, and CRP.  Patient has an underlying systemic illness (SLE) which may be causing a reactive thrombocytosis.  Discuss close monitoring of counts and any concerning symptoms.   Discuss coordination of care with other providers.  Discuss consideration of a bone marrow aspirate/biopsy to clarify peripheral blood counts.   Procedure described in details.  Risks and benefits reviewed. 3.  Discuss plan to return to clinic for reassessment if any concerning symptoms or change in counts. 4.  [facility] slip for flow cytometry, sed rate, CRP. 5.  [facility] slip for 1 year (CBC with diff, CMP). 6.  RTC in 1 year for MD assessment and review of [facility] labs.   Honor Loh, NP  12/08/2018, 2:54 PM   I saw and evaluated the patient, participating in the key portions of the service and reviewing pertinent diagnostic studies and records.  I reviewed the nurse practitioner's note and agree with the findings and the plan.  The assessment and plan were discussed with the patient.  Multiple questions were asked by the patient and answered.   Nolon Stalls, MD 12/08/2018,2:54 PM

## 2018-12-08 ENCOUNTER — Encounter: Payer: Self-pay | Admitting: Hematology and Oncology

## 2018-12-08 ENCOUNTER — Ambulatory Visit: Payer: PRIVATE HEALTH INSURANCE | Admitting: Hematology and Oncology

## 2018-12-08 ENCOUNTER — Inpatient Hospital Stay: Payer: PRIVATE HEALTH INSURANCE | Attending: Hematology and Oncology | Admitting: Hematology and Oncology

## 2018-12-08 VITALS — BP 134/65 | HR 70 | Temp 98.6°F | Resp 18 | Ht 66.0 in | Wt 173.8 lb

## 2018-12-08 DIAGNOSIS — Z808 Family history of malignant neoplasm of other organs or systems: Secondary | ICD-10-CM | POA: Diagnosis not present

## 2018-12-08 DIAGNOSIS — M329 Systemic lupus erythematosus, unspecified: Secondary | ICD-10-CM | POA: Insufficient documentation

## 2018-12-08 DIAGNOSIS — Z8041 Family history of malignant neoplasm of ovary: Secondary | ICD-10-CM | POA: Insufficient documentation

## 2018-12-08 DIAGNOSIS — D649 Anemia, unspecified: Secondary | ICD-10-CM | POA: Diagnosis not present

## 2018-12-08 DIAGNOSIS — I10 Essential (primary) hypertension: Secondary | ICD-10-CM

## 2018-12-08 DIAGNOSIS — D75839 Thrombocytosis, unspecified: Secondary | ICD-10-CM

## 2018-12-08 DIAGNOSIS — D473 Essential (hemorrhagic) thrombocythemia: Secondary | ICD-10-CM

## 2018-12-08 DIAGNOSIS — Z7982 Long term (current) use of aspirin: Secondary | ICD-10-CM | POA: Diagnosis not present

## 2018-12-08 DIAGNOSIS — D72821 Monocytosis (symptomatic): Secondary | ICD-10-CM | POA: Insufficient documentation

## 2018-12-08 DIAGNOSIS — Z79899 Other long term (current) drug therapy: Secondary | ICD-10-CM | POA: Diagnosis not present

## 2018-12-08 NOTE — Progress Notes (Signed)
No new changes noted today 

## 2018-12-15 ENCOUNTER — Other Ambulatory Visit: Payer: Self-pay

## 2018-12-17 ENCOUNTER — Telehealth: Payer: Self-pay

## 2018-12-17 NOTE — Telephone Encounter (Signed)
Spoke with Ms crago, per Dr Mike Gip to inform the patient with her Flow results it was negative.Sed was normal and C reactive protein was 20. The patient was understanding and agreeable.

## 2018-12-17 NOTE — Telephone Encounter (Signed)
-----   Message from Leighton Parody sent at 12/17/2018  9:16 AM EST ----- Regarding: Labs Shaniqua called and said she had the labs done that Dr. Mike Gip suggested and that she doesn't do my chart or want to do my chart. She said she would like Dr. Mike Gip to look over her lab results and for somebody to call her back.

## 2018-12-18 ENCOUNTER — Telehealth: Payer: Self-pay | Admitting: Family Medicine

## 2018-12-18 NOTE — Telephone Encounter (Signed)
Please call and let her know she can pick up a script to take to medical supply to get herself a monitor

## 2018-12-18 NOTE — Telephone Encounter (Signed)
Copied from [place] [phone]. Topic: General - Other >> Dec 18, 2018  2:01 PM Keene Breath wrote: Reason for CRM: Patient called to request if at all possible that she receive a BP machine since she is a patient at the office and she has to keep a log of her BP daily.  Please advise and call the patient back as soon as possible to let her know if she can get the machine from the office.  CB# [phone]

## 2018-12-18 NOTE — Telephone Encounter (Signed)
Spoke with the patient. She will pick up the script. It is in the file upfront.

## 2018-12-22 ENCOUNTER — Encounter: Payer: Self-pay | Admitting: Urgent Care

## 2019-03-04 ENCOUNTER — Other Ambulatory Visit: Payer: Self-pay | Admitting: Family Medicine

## 2019-03-04 ENCOUNTER — Other Ambulatory Visit: Payer: Self-pay | Admitting: Obstetrics and Gynecology

## 2019-03-04 DIAGNOSIS — Z1231 Encounter for screening mammogram for malignant neoplasm of breast: Secondary | ICD-10-CM

## 2019-03-19 ENCOUNTER — Telehealth: Payer: Self-pay | Admitting: Certified Nurse Midwife

## 2019-03-19 NOTE — Telephone Encounter (Signed)
Last AEX with [name], CNM on 03/27/18 Next AEX 04/01/19  Labs on 04/08/18. Vit D, TSH, lipid panel, CMP  Routing to covering provider, Dr. Sabra Heck, to review and advise on AEX labs.

## 2019-03-19 NOTE — Telephone Encounter (Signed)
Patient would like an order for labs for aex faxed to [facility] in [place] to have done before appointment on 6/10.

## 2019-03-20 NOTE — Telephone Encounter (Signed)
Spoke with patient, advised as seen below per Dr. Sabra Heck. Patient agreeable to plan, has additional questions regarding billing. Message forwarded to business office for return call.   Encounter closed.

## 2019-03-20 NOTE — Telephone Encounter (Signed)
Pt just had CBC and CMP in 11/2018.  She had low Vit D last year, normal cholesterol, normal thyroid.  The only thing I would repeat this year would be the Vit D and she can do that the day she is her.  She does not need to come early for that.

## 2019-03-31 NOTE — Progress Notes (Signed)
63 y.o. G58P1001 Widowed  Caucasian Fe here for annual exam. Menopausal occasional hot flash/night sweats.. Denies vaginal bleeding only slight dryness. Sees PCP yearly for labs and tramadol/Xanax management. Sees Nephrology for chronic kidney disease with no change and on hypertension medication now. Sees Rheumatology for Lupus management, all stable.   Patient's last menstrual period was 08/22/2008.          Sexually active: Yes.    The current method of family planning is post menopausal status.    Exercising: Yes.    walking  Smoker:  no  Review of Systems  All other systems reviewed and are negative.   Health Maintenance: Pap:  03-25-17 neg HPV HR neg, 04-01-18 neg History of Abnormal Pap: yes MMG:  See imaging Self Breast exams: yes Colonoscopy:  2018 f/u 34yrs, endoscopy done also BMD:   2011 normal per patient  TDaP:  2019 Shingles: no  Pneumonia: done with pcp yrs ago Hep C and HIV: both neg 2017 Labs: if needed   reports that she has never smoked. She has never used smokeless tobacco. She reports current alcohol use of about 4.0 - 6.0 standard drinks of alcohol per week. She reports current drug use. Drug: Marijuana.  Past Medical History:  Diagnosis Date  . Abnormal pap   . Anemia   . Anxiety   . History of lupus ([place]) 1987  . Hypertension   . Low vitamin D level   . Renal insufficiency 1988   kidney biopsy 1988 and 1998- "my kidneys function at like 48%"    Past Surgical History:  Procedure Laterality Date  . BREAST BIOPSY Right 07/08/2017  . BREAST EXCISIONAL BIOPSY Right 08/06/2017  . BREAST EXCISIONAL BIOPSY Right 07/1999  . BREAST LUMPECTOMY WITH RADIOACTIVE SEED LOCALIZATION Right 08/06/2017   Procedure: RIGHT BREAST LUMPECTOMY WITH RADIOACTIVE SEED LOCALIZATION ERAS PATHWAY;  Surgeon: Excell Seltzer, MD;  Location: [place];  Service: General;  Laterality: Right;  . BREAST LUMPECTOMY WITH RADIOACTIVE SEED LOCALIZATION Right 08/19/2018    Procedure: RIGHT BREAST LUMPECTOMY WITH RADIOACTIVE SEED LOCALIZATION ERAS PATHWAY;  Surgeon: Excell Seltzer, MD;  Location: [place];  Service: General;  Laterality: Right;  . BREAST SURGERY  10/00   right breast benign fibroadenoma  . COLONOSCOPY  2012  . COLONOSCOPY WITH PROPOFOL N/A 05/01/2017   Procedure: COLONOSCOPY WITH PROPOFOL;  Surgeon: Christene Lye, MD;  Location: [facility];  Service: Endoscopy;  Laterality: N/A;  . CRYOTHERAPY     abnormal pap smear  . ESOPHAGOGASTRODUODENOSCOPY (EGD) WITH PROPOFOL N/A 05/01/2017   Procedure: ESOPHAGOGASTRODUODENOSCOPY (EGD) WITH PROPOFOL;  Surgeon: Christene Lye, MD;  Location: [facility];  Service: Endoscopy;  Laterality: N/A;  . NASAL SEPTUM SURGERY  2007  . TUBAL LIGATION  1990    Current Outpatient Medications  Medication Sig Dispense Refill  . ALPRAZolam (XANAX) 0.25 MG tablet Take 0.25 mg by mouth daily as needed for anxiety.     Marland Kitchen aspirin 81 MG tablet Take 81 mg by mouth daily.    . enalapril (VASOTEC) 10 MG tablet Take 10 mg by mouth daily.    . hydroxychloroquine (PLAQUENIL) 200 MG tablet Take 200 mg by mouth every other day.     Marland Kitchen Neomy-Bacit-Polymyx-Pramoxine ([employer] ANTIBIOTIC PAIN/SCAR EX) Apply 1 application topically 2 (two) times daily.    Marland Kitchen oxyCODONE (OXY IR/ROXICODONE) 5 MG immediate release tablet Take 1 tablet (5 mg total) by mouth every 6 (six) hours as needed for moderate pain, severe pain or breakthrough pain. (  Patient not taking: Reported on 12/08/2018) 12 tablet 0  . traMADol (ULTRAM) 50 MG tablet Take 50 mg by mouth every 6 (six) hours as needed for moderate pain.    . traZODone (DESYREL) 50 MG tablet Take 50 mg by mouth at bedtime as needed for sleep.      No current facility-administered medications for this visit.     Family History  Problem Relation Age of Onset  . Cancer Father        oral cancer  . Cancer Maternal Grandmother        ovarian cancer  . Hypertension Maternal  Grandmother   . Diabetes Maternal Grandfather   . Diabetes Paternal Aunt   . Breast cancer Neg Hx     ROS:  Pertinent items are noted in HPI.  Otherwise, a comprehensive ROS was negative.  Exam:   LMP 08/22/2008    Ht Readings from Last 3 Encounters:  12/08/18 5\' 6"  (1.676 m)  08/12/18 5\' 6"  (1.676 m)  07/29/18 5\' 6"  (1.676 m)    General appearance: alert, cooperative and appears stated age Head: Normocephalic, without obvious abnormality, atraumatic Neck: no adenopathy, supple, symmetrical, trachea midline and thyroid normal to inspection and palpation Lungs: clear to auscultation bilaterally Breasts: normal appearance, no masses or tenderness, No nipple retraction or dimpling, No nipple discharge or bleeding, No axillary or supraclavicular adenopathy, scarring right breast from lumpectomies Heart: regular rate and rhythm Abdomen: soft, non-tender; no masses,  no organomegaly Extremities: extremities normal, atraumatic, no cyanosis or edema Skin: Skin color, texture, turgor normal. No rashes or lesions Lymph nodes: Cervical, supraclavicular, and axillary nodes normal. No abnormal inguinal nodes palpated Neurologic: Grossly normal   Pelvic: External genitalia:  no lesions              Urethra:  normal appearing urethra with no masses, tenderness or lesions              Bartholin's and Skene's: normal                 Vagina: normal appearing vagina with normal color and discharge, no lesions              Cervix: no bleeding following Pap, no cervical motion tenderness and no lesions              Pap taken: Yes.   Bimanual Exam:  Uterus:  normal size, contour, position, consistency, mobility, non-tender and anteverted              Adnexa: normal adnexa and no mass, fullness, tenderness               Rectovaginal: Confirms               Anus:  normal sphincter tone, no lesions  Chaperone present: yes  A:  Well Woman with normal exam  Menopausal  No HRT  History of numerous  lumpectomies, but malignancies  Lupus history on Plaquenil with RA management  Hypertension with chronic kidney disease with speciality management  BMD due  P:   Reviewed health and wellness pertinent to exam  Discussed importance of reporting any vaginal bleeding.  Continue follow up with MD regarding other health issues.  Stressed mammogram yearly and SBE monthly.  Patient will call to schedule with mammogram, [facility]  Pap smear: yes   counseled on breast self exam, mammography screening, feminine hygiene, adequate intake of calcium and vitamin D, diet and exercise, Kegel's exercises  return annually or prn  An After Visit Summary was printed and given to the patient.

## 2019-04-01 ENCOUNTER — Ambulatory Visit: Payer: PRIVATE HEALTH INSURANCE | Admitting: Certified Nurse Midwife

## 2019-04-01 ENCOUNTER — Other Ambulatory Visit: Payer: Self-pay

## 2019-04-01 ENCOUNTER — Other Ambulatory Visit (HOSPITAL_COMMUNITY)
Admission: RE | Admit: 2019-04-01 | Discharge: 2019-04-01 | Disposition: A | Discharge status: Home / Self Care | Payer: PRIVATE HEALTH INSURANCE | Source: Ambulatory Visit | Attending: Certified Nurse Midwife | Admitting: Certified Nurse Midwife

## 2019-04-01 ENCOUNTER — Encounter: Payer: Self-pay | Admitting: Certified Nurse Midwife

## 2019-04-01 VITALS — BP 136/74 | HR 72 | Temp 97.3°F | Resp 14 | Ht 65.0 in | Wt 174.2 lb

## 2019-04-01 DIAGNOSIS — Z124 Encounter for screening for malignant neoplasm of cervix: Secondary | ICD-10-CM | POA: Diagnosis present

## 2019-04-01 DIAGNOSIS — Z780 Asymptomatic menopausal state: Secondary | ICD-10-CM | POA: Diagnosis not present

## 2019-04-01 DIAGNOSIS — Z01419 Encounter for gynecological examination (general) (routine) without abnormal findings: Secondary | ICD-10-CM | POA: Diagnosis not present

## 2019-04-01 NOTE — Patient Instructions (Signed)

## 2019-04-03 LAB — CYTOLOGY - PAP: Diagnosis: NEGATIVE

## 2019-05-12 ENCOUNTER — Encounter: Payer: Self-pay | Admitting: General Surgery

## 2019-06-11 ENCOUNTER — Other Ambulatory Visit: Payer: Self-pay

## 2019-06-11 DIAGNOSIS — K824 Cholesterolosis of gallbladder: Secondary | ICD-10-CM

## 2019-07-02 ENCOUNTER — Ambulatory Visit
Admission: RE | Admit: 2019-07-02 | Discharge: 2019-07-02 | Disposition: A | Discharge status: Home / Self Care | Payer: PRIVATE HEALTH INSURANCE | Source: Ambulatory Visit | Attending: General Surgery | Admitting: General Surgery

## 2019-07-02 ENCOUNTER — Other Ambulatory Visit: Payer: Self-pay

## 2019-07-02 DIAGNOSIS — K824 Cholesterolosis of gallbladder: Secondary | ICD-10-CM | POA: Diagnosis not present

## 2019-07-06 ENCOUNTER — Ambulatory Visit
Admission: RE | Admit: 2019-07-06 | Discharge: 2019-07-06 | Disposition: A | Discharge status: Home / Self Care | Payer: PRIVATE HEALTH INSURANCE | Source: Ambulatory Visit | Attending: Obstetrics and Gynecology | Admitting: Obstetrics and Gynecology

## 2019-07-06 ENCOUNTER — Other Ambulatory Visit: Payer: Self-pay

## 2019-07-06 ENCOUNTER — Ambulatory Visit
Admission: RE | Admit: 2019-07-06 | Discharge: 2019-07-06 | Disposition: A | Discharge status: Home / Self Care | Payer: PRIVATE HEALTH INSURANCE | Source: Ambulatory Visit | Attending: Certified Nurse Midwife | Admitting: Certified Nurse Midwife

## 2019-07-06 DIAGNOSIS — Z780 Asymptomatic menopausal state: Secondary | ICD-10-CM

## 2019-07-06 DIAGNOSIS — Z1231 Encounter for screening mammogram for malignant neoplasm of breast: Secondary | ICD-10-CM

## 2019-07-07 ENCOUNTER — Ambulatory Visit: Payer: PRIVATE HEALTH INSURANCE | Admitting: General Surgery

## 2019-07-09 ENCOUNTER — Other Ambulatory Visit: Payer: Self-pay

## 2019-07-09 ENCOUNTER — Ambulatory Visit (INDEPENDENT_AMBULATORY_CARE_PROVIDER_SITE_OTHER): Payer: PRIVATE HEALTH INSURANCE | Admitting: General Surgery

## 2019-07-09 ENCOUNTER — Encounter: Payer: Self-pay | Admitting: General Surgery

## 2019-07-09 VITALS — BP 154/77 | HR 76 | Temp 97.2°F | Ht 66.0 in | Wt 181.0 lb

## 2019-07-09 DIAGNOSIS — K824 Cholesterolosis of gallbladder: Secondary | ICD-10-CM | POA: Diagnosis not present

## 2019-07-09 NOTE — Progress Notes (Signed)
Patient ID: [name], female   DOB: 1956/02/28, 63 y.o.   MRN: [id number]  Chief Complaint  Patient presents with  . Follow-up    gallbladder polyp    HPI [name] is a 63 y.o. female.  She was followed in the past by Dr. Jamal Collin and Dr. Bary Castilla.  She reports that she was having abdominal pain and digestive issues, such that she had pain with eating and "everything went straight to the bathroom" after she ate.  She had a right upper quadrant ultrasound in 2018.  This is negative for cholelithiasis, cholecystitis, or choledocholithiasis.  Small gallbladder polyps were identified.  She was seen by Dr. Bary Castilla last year after having a repeat ultrasound with similar findings.  He is here today for follow-up after having had a third ultrasound.  She states that she does not experience any nausea or vomiting and does not have any significant pain in her abdomen/right upper quadrant.  She continues to have a bowel movement very shortly after eating.  She says that it is not necessarily diarrhea and can be solid at times.  She has taken to avoiding greasy foods and has cut out most sodas.  She has never had right upper quadrant pain associated with eating.  She has never had pancreatitis or jaundice.  She has had both upper and lower endoscopy without an identifiable etiology for her symptoms.   Past Medical History:  Diagnosis Date  . Abnormal pap   . Anemia   . Anxiety   . History of lupus ([place]) 1987  . Hypertension   . Low vitamin D level   . Renal insufficiency 1988   kidney biopsy 1988 and 1998- "my kidneys function at like 48%"    Past Surgical History:  Procedure Laterality Date  . BREAST BIOPSY Right 07/08/2017  . BREAST EXCISIONAL BIOPSY Right 08/06/2017  . BREAST EXCISIONAL BIOPSY Right 07/1999  . BREAST LUMPECTOMY WITH RADIOACTIVE SEED LOCALIZATION Right 08/06/2017   Procedure: RIGHT BREAST LUMPECTOMY WITH RADIOACTIVE SEED LOCALIZATION ERAS PATHWAY;  Surgeon:  Excell Seltzer, MD;  Location: [facility];  Service: General;  Laterality: Right;  . BREAST LUMPECTOMY WITH RADIOACTIVE SEED LOCALIZATION Right 08/19/2018   Procedure: RIGHT BREAST LUMPECTOMY WITH RADIOACTIVE SEED LOCALIZATION ERAS PATHWAY;  Surgeon: Excell Seltzer, MD;  Location: [facility];  Service: General;  Laterality: Right;  . BREAST SURGERY  10/00   right breast benign fibroadenoma  . COLONOSCOPY  2012  . COLONOSCOPY WITH PROPOFOL N/A 05/01/2017   Procedure: COLONOSCOPY WITH PROPOFOL;  Surgeon: Christene Lye, MD;  Location: [facility];  Service: Endoscopy;  Laterality: N/A;  . CRYOTHERAPY     abnormal pap smear  . ESOPHAGOGASTRODUODENOSCOPY (EGD) WITH PROPOFOL N/A 05/01/2017   Procedure: ESOPHAGOGASTRODUODENOSCOPY (EGD) WITH PROPOFOL;  Surgeon: Christene Lye, MD;  Location: [facility];  Service: Endoscopy;  Laterality: N/A;  . NASAL SEPTUM SURGERY  2007  . TUBAL LIGATION  1990    Family History  Problem Relation Age of Onset  . Cancer Father        oral cancer  . Cancer Maternal Grandmother        ovarian cancer  . Hypertension Maternal Grandmother   . Diabetes Maternal Grandfather   . Diabetes Paternal Aunt   . Breast cancer Neg Hx     Social History Social History   Tobacco Use  . Smoking status: Never Smoker  . Smokeless tobacco: Never Used  Substance Use Topics  . Alcohol use: Yes  Alcohol/week: 4.0 - 6.0 standard drinks    Types: 4 - 6 Standard drinks or equivalent per week    Comment: few beers on friday and saturday  . Drug use: Yes    Types: Marijuana    Comment: few times a month     Allergies  Allergen Reactions  . Sulfa Antibiotics Hives, Itching and Rash  . Sulfur Hives    Current Outpatient Medications  Medication Sig Dispense Refill  . ALPRAZolam (XANAX) 0.25 MG tablet Take 0.25 mg by mouth daily as needed for anxiety.     Marland Kitchen aspirin 81 MG tablet Take 81 mg by mouth daily.    . Cholecalciferol  (VITAMIN D3 PO) Take by mouth.    . enalapril (VASOTEC) 10 MG tablet Take 10 mg by mouth daily.    . hydroxychloroquine (PLAQUENIL) 200 MG tablet Take 200 mg by mouth every other day.     . traMADol (ULTRAM) 50 MG tablet Take 50 mg by mouth every 6 (six) hours as needed for moderate pain.    . traZODone (DESYREL) 50 MG tablet Take 50 mg by mouth at bedtime as needed for sleep.      No current facility-administered medications for this visit.     Review of Systems Review of Systems  All other systems reviewed and are negative.   Blood pressure (!) 154/77, pulse 76, temperature (!) 97.2 F (36.2 C), height 5\' 6"  (1.676 m), weight 181 lb (82.1 kg), last menstrual period 08/22/2008, SpO2 100 %.  Physical Exam Physical Exam Constitutional:      General: She is not in acute distress.    Appearance: Normal appearance. She is normal weight.  HENT:     Head: Normocephalic and atraumatic.     Nose:     Comments: Covered with a mask secondary to COVID-19 precautions    Mouth/Throat:     Comments: Covered with a mask secondary to COVID-19 precautions Eyes:     General: No scleral icterus.       Right eye: No discharge.        Left eye: No discharge.  Cardiovascular:     Rate and Rhythm: Normal rate.  Pulmonary:     Effort: Pulmonary effort is normal.  Abdominal:     Tenderness: There is no abdominal tenderness.  Genitourinary:    Comments: Deferred Neurological:     Mental Status: She is alert.     Data Reviewed I reviewed the right upper quadrant ultrasounds performed in 2018, 2019, and the most recent ultrasound performed on September 10.  These have all shown multiple small gallbladder polyps.  On the most recent study, the largest measures 4 mm.  Assessment This is a 63 year old woman who had some gastrointestinal complaints that led to a right upper quadrant ultrasound.  No significant biliary pathology was identified, aside from multiple small gallbladder polyps.  These  have remained stable over 3 separate sets of imaging.  The largest is 4 mm.  The data for gallbladder polyps suggest that lesions less than half a centimeter are unlikely to harbor malignancy, although there have been a few reports describing cancer in small gallbladder polyps, less than 6 mm..  Plan Based upon the stability of her imaging, I think it highly unlikely that any of these polyps harbor a malignancy.  I would like to get one more ultrasound in 2 years' time.  If these remain unchanged, I think we can discontinue surveillance.    Fredirick Maudlin 07/09/2019, 3:40 PM

## 2019-07-09 NOTE — Patient Instructions (Signed)
Return in 2 years ultrasound

## 2019-12-07 ENCOUNTER — Encounter: Payer: Self-pay | Admitting: Hematology and Oncology

## 2019-12-10 NOTE — Progress Notes (Signed)
[facility]  [address], [address] [place], [place] [address] Phone: [phone]  Fax: [phone]   Clinic Day:  12/14/2019  Referring physician: Volney American,*  Chief Complaint: [name] is a 64 y.o. female with systemic lupus erythematosis (SLE), chronic thrombocytosis and monocytosis who is seen for a 1 year assessment.  HPI: The patient was last seen in the hematology clinic on 12/08/2018. At that time, she felt "good".  She denied any B symptoms. Exam revealed no adenopathy or hepatosplenomegaly. Hematocrit 34.9, hemoglobin 11.8, platelets 595,000, WBC 8,300 (ANC 3700). Monocyte count was 1300. Creatinine was 1.12.   Flow cytometry on 12/10/2018 showed no circulating blasts detected. There was no monoclonal B cell population detected. There was no significant immunophenotypic abnormalities detected.   RUQ abdominal ultrasound on 07/02/2019 showed stable small gallbladder polyps were noted with the largest measuring 4 mm. There were no other abnormality seen in the right upper quadrant of the abdomen.  Bilateral screening mammogram on 07/06/2019 showed no evidence of malignancy.   Bone density on 07/06/2019 was normal with a T-score of -0.3 at the left femoral neck.   Patient was seen by Dr. Celine Ahr on 07/09/2019. She continued to have a bowel movement shortly after eating. She said that it was not necessarily diarrhea and can be solid at times. She never had right upper quadrant pain associated with eating. She never had pancreatitis or jaundice. Dr. Celine Ahr wanted to check an ultrasound in 2 years' time.  If these remain unchanged, she would discontinue surveillance.  [facility] labs on 12/04/2019 showed hematocrit 34.6, hemoglobin 12.1, platelets 534,000, WBC 11,300 (ANC 6800).  Monocyte count was 1500.  Creatinine was 1.14.  LFTs were normal.  During the interim, she has done "alright". She notes weight gain. She has good energy.  She  denies any fevers or sweats.  She denies any bruising or bleeding.  She denies any infections. She denies any early satiety.  She reports eating at a slow pace. She remains active and takes a daily walk with her dog.   She continues to work a part time job. She notes that she has to lift heavy boxes at work, but she takes her time so there is no strain on the muscles. Her arthritis pain has increased in her right knee secondary to the cold weather.   She has reports a new issue. She is unable to bend her fingers down all the way on her left hand. She is being seen by a rheumatologist, Dr. Jefm Bryant, and will have a follow up in 12/2019. She denies any gripping issues. She reports getting an injection in her left hand.    Patients labs are drawn by Dr. Carleene Cooper and her nephrologist.    Past Medical History:  Diagnosis Date  . Abnormal pap   . Anemia   . Anxiety   . History of lupus ([place]) 1987  . Hypertension   . Low vitamin D level   . Renal insufficiency 1988   kidney biopsy 1988 and 1998- "my kidneys function at like 48%"    Past Surgical History:  Procedure Laterality Date  . BREAST BIOPSY Right 07/08/2017  . BREAST EXCISIONAL BIOPSY Right 08/06/2017  . BREAST EXCISIONAL BIOPSY Right 07/1999  . BREAST LUMPECTOMY WITH RADIOACTIVE SEED LOCALIZATION Right 08/06/2017   Procedure: RIGHT BREAST LUMPECTOMY WITH RADIOACTIVE SEED LOCALIZATION ERAS PATHWAY;  Surgeon: Excell Seltzer, MD;  Location: [facility];  Service: General;  Laterality: Right;  . BREAST  LUMPECTOMY WITH RADIOACTIVE SEED LOCALIZATION Right 08/19/2018   Procedure: RIGHT BREAST LUMPECTOMY WITH RADIOACTIVE SEED LOCALIZATION ERAS PATHWAY;  Surgeon: Excell Seltzer, MD;  Location: [facility];  Service: General;  Laterality: Right;  . BREAST SURGERY  10/00   right breast benign fibroadenoma  . COLONOSCOPY  2012  . COLONOSCOPY WITH PROPOFOL N/A 05/01/2017   Procedure: COLONOSCOPY WITH PROPOFOL;  Surgeon: Christene Lye, MD;  Location: [facility];  Service: Endoscopy;  Laterality: N/A;  . CRYOTHERAPY     abnormal pap smear  . ESOPHAGOGASTRODUODENOSCOPY (EGD) WITH PROPOFOL N/A 05/01/2017   Procedure: ESOPHAGOGASTRODUODENOSCOPY (EGD) WITH PROPOFOL;  Surgeon: Christene Lye, MD;  Location: [facility];  Service: Endoscopy;  Laterality: N/A;  . NASAL SEPTUM SURGERY  2007  . TUBAL LIGATION  1990    Family History  Problem Relation Age of Onset  . Cancer Father        oral cancer  . Cancer Maternal Grandmother        ovarian cancer  . Hypertension Maternal Grandmother   . Diabetes Maternal Grandfather   . Diabetes Paternal Aunt   . Breast cancer Neg Hx     Social History:  reports that she has never smoked. She has never used smokeless tobacco. She reports current alcohol use of about 4.0 - 6.0 standard drinks of alcohol per week. She reports current drug use. Drug: Marijuana. Her husband had stage IIIB lung cancer.  He died 12/20/2016.  She has a part time job. The patient is alone today.  Allergies:  Allergies  Allergen Reactions  . Sulfa Antibiotics Hives, Itching and Rash  . Sulfur Hives    Current Medications: Current Outpatient Medications  Medication Sig Dispense Refill  . ALPRAZolam (XANAX) 0.25 MG tablet Take 0.25 mg by mouth daily as needed for anxiety.     Marland Kitchen aspirin 81 MG tablet Take 81 mg by mouth daily.    . calcium carbonate (OS-CAL) 600 MG TABS tablet Take 600 mg by mouth daily with breakfast.     . Cholecalciferol (VITAMIN D3 PO) Take 1 tablet by mouth daily.     . enalapril (VASOTEC) 10 MG tablet Take 10 mg by mouth daily.    . fluticasone (FLONASE) 50 MCG/ACT nasal spray Place 1 spray into both nostrils as needed.     . hydroxychloroquine (PLAQUENIL) 200 MG tablet Take 200 mg by mouth every other day.     . traMADol (ULTRAM) 50 MG tablet Take 50 mg by mouth every 6 (six) hours as needed for moderate pain.    . traZODone (DESYREL) 50 MG tablet Take 50  mg by mouth at bedtime as needed for sleep.      No current facility-administered medications for this visit.    Review of Systems  Constitutional: Negative for chills, diaphoresis, fever, malaise/fatigue and weight loss (up 11 lbs since 12/08/2018).       Doing "alright".  Energy level is good.  HENT: Negative for congestion, ear pain, hearing loss, nosebleeds, sinus pain, sore throat and tinnitus.   Eyes: Negative for blurred vision, double vision and photophobia.  Respiratory: Negative.  Negative for cough, hemoptysis, sputum production and shortness of breath.   Cardiovascular: Negative.  Negative for chest pain, orthopnea and leg swelling.  Gastrointestinal: Negative.  Negative for abdominal pain, blood in stool, constipation, diarrhea, heartburn, melena, nausea and vomiting.  Genitourinary: Negative.  Negative for dysuria, flank pain, frequency, hematuria and urgency.  Musculoskeletal: Positive for joint pain (arthritis; right knee aches when  cold and rainy). Negative for back pain, myalgias and neck pain.  Skin: Negative for itching and rash.  Neurological: Negative for dizziness, tingling, sensory change, focal weakness, weakness and headaches.       Unable to bend left hand fingers down all the way.  Endo/Heme/Allergies: Negative.  Does not bruise/bleed easily.  Psychiatric/Behavioral: Negative.  Negative for depression and memory loss. The patient is not nervous/anxious and does not have insomnia.   All other systems reviewed and are negative.  Performance status (ECOG): 1  Vitals Blood pressure 127/70, pulse 74, temperature (!) 97.5 F (36.4 C), temperature source Tympanic, resp. rate 16, weight 184 lb 4.9 oz (83.6 kg), last menstrual period 08/22/2008, SpO2 100 %.   Physical Exam  Constitutional: She is oriented to person, place, and time. She appears well-developed and well-nourished. No distress.  HENT:  Head: Normocephalic and atraumatic.  Mouth/Throat: Oropharynx is  clear and moist. No oropharyngeal exudate.  Brown styled hair clipped back.  Eyes: Pupils are equal, round, and reactive to light. Conjunctivae and EOM are normal. No scleral icterus.  Glasses. Blue eyes.  Cardiovascular: Normal rate, regular rhythm and normal heart sounds.  No murmur heard. Pulmonary/Chest: Effort normal and breath sounds normal. No respiratory distress. She has no wheezes. She has no rales. She exhibits no tenderness.  Abdominal: Soft. Bowel sounds are normal. She exhibits no distension and no mass. There is no hepatosplenomegaly. There is no abdominal tenderness. There is no rebound and no guarding.  Musculoskeletal:        General: No tenderness or edema. Normal range of motion.     Cervical back: Normal range of motion and neck supple.  Lymphadenopathy:       Head (right side): No preauricular, no posterior auricular and no occipital adenopathy present.       Head (left side): No preauricular, no posterior auricular and no occipital adenopathy present.    She has no cervical adenopathy.    She has no axillary adenopathy.       Right: No inguinal and no supraclavicular adenopathy present.       Left: No inguinal and no supraclavicular adenopathy present.  Neurological: She is alert and oriented to person, place, and time.  Skin: Skin is warm and dry. No rash noted. She is not diaphoretic. No erythema. No pallor.  Psychiatric: She has a normal mood and affect. Her behavior is normal. Judgment and thought content normal.  Nursing note and vitals reviewed.    No visits with results within 3 Day(s) from this visit.  Latest known visit with results is:  Office Visit on 04/01/2019  Component Date Value Ref Range Status  . Adequacy 04/01/2019 Satisfactory for evaluation  endocervical/transformation zone component PRESENT.   Final  . Diagnosis 04/01/2019 NEGATIVE FOR INTRAEPITHELIAL LESIONS OR MALIGNANCY.   Final  . Material Submitted 04/01/2019 CervicoVaginal Pap [ThinPrep  Imaged]   Final  . CYTOLOGY - PAP 04/01/2019 PAP RESULT   Final-Edited    Assessment:  [name] is a 64 y.o. female with chronic thrombocytosis and monocytosis since 2012.  She may have an early myeloproliferative disorder such as CMML without current need for treatment.  She has a mild normocytic anemia.   Work-up in 20012 revealed a normal LDH, BCR-ABL, JAK2 V617F mutation, ESR, and iron studies.  CALR mutation and MPL from [facility] on 07/16/2017 were negative.  Work-up on 12/18/2017 at [facility] was negative for JAK2 V617F, exon 12-15, and BCR-ABL.  Flow cytometry on 12/10/2018 showed  no circulating blasts detected. There was no monoclonal B cell population detected. There was no significant immunophenotypic abnormalities detected.   She has a history of systemic lupus erythematosis (SLE) complicated by class III lupus nephritis.  Creatinine was 1.2 on 06/13/2018.  She is on Plaquenil 200 mg QOD.  ANA was negative on 01/21/2018.  C3 (116) and C4 (28) were normal on 01/21/2018.  She has early arthritis.  Her last colonoscopy was in 2009 or 2013.  Diet is fair.  Symptomatically, she is doing well.  She denies any B symptoms, early satiety, fatigue, infections, or bruising/bleeding.  Exam reveals no adenopathy or hepatosplenomegaly.  Plan: 1.   Review labs from [facility]. 2.   Chronic thrombocytosis and monocytosis             Etiology of monocytosis is unclear.  She has had a persistent monocyte count > 1000.               Work-up for a myeloproliferative disorder has been negative (JAK2, CALR, MPL, BCR-ABL).               Flow cytometry on 12/10/2018 revealed no blasts, monoclonal B cell population or significant abnormalities.             Patient has an underlying systemic illness (SLE) which may be causing a reactive thrombocytosis and monocytosis.             Continue close monitoring of counts.   Review symptoms which would prompt further investigation.             Discuss possible  future bone marrow aspirate/biopsy to clarify peripheral blood counts. 3.   Patient to return to clinic reassessment for any concerning symptoms or change in counts. 4.   RTC in 1 year for MD assessment and review of [facility] labs (CBC with diff, CMP, LDH, uric acid).   I discussed the assessment and treatment plan with the patient.  The patient was provided an opportunity to ask questions and all were answered.  The patient agreed with the plan and demonstrated an understanding of the instructions.  The patient was advised to call back if the symptoms worsen or if the condition fails to improve as anticipated.   Lequita Asal, MD, PhD    12/14/2019, 1:09 PM  I, Selena Batten, am acting as scribe for [employer]. Mike Gip, MD, PhD.  I, Nawaal Alling C. Mike Gip, MD, have reviewed the above documentation for accuracy and completeness, and I agree with the above.

## 2019-12-14 ENCOUNTER — Encounter: Payer: Self-pay | Admitting: Hematology and Oncology

## 2019-12-14 ENCOUNTER — Inpatient Hospital Stay: Payer: PRIVATE HEALTH INSURANCE | Attending: Hematology and Oncology | Admitting: Hematology and Oncology

## 2019-12-14 ENCOUNTER — Other Ambulatory Visit: Payer: Self-pay

## 2019-12-14 VITALS — BP 127/70 | HR 74 | Temp 97.5°F | Resp 16 | Wt 184.3 lb

## 2019-12-14 DIAGNOSIS — I10 Essential (primary) hypertension: Secondary | ICD-10-CM | POA: Diagnosis not present

## 2019-12-14 DIAGNOSIS — Z79899 Other long term (current) drug therapy: Secondary | ICD-10-CM | POA: Insufficient documentation

## 2019-12-14 DIAGNOSIS — F419 Anxiety disorder, unspecified: Secondary | ICD-10-CM | POA: Diagnosis not present

## 2019-12-14 DIAGNOSIS — Z8041 Family history of malignant neoplasm of ovary: Secondary | ICD-10-CM | POA: Diagnosis not present

## 2019-12-14 DIAGNOSIS — Z833 Family history of diabetes mellitus: Secondary | ICD-10-CM | POA: Insufficient documentation

## 2019-12-14 DIAGNOSIS — D72821 Monocytosis (symptomatic): Secondary | ICD-10-CM | POA: Diagnosis not present

## 2019-12-14 DIAGNOSIS — R7989 Other specified abnormal findings of blood chemistry: Secondary | ICD-10-CM | POA: Insufficient documentation

## 2019-12-14 DIAGNOSIS — D649 Anemia, unspecified: Secondary | ICD-10-CM | POA: Diagnosis not present

## 2019-12-14 DIAGNOSIS — M329 Systemic lupus erythematosus, unspecified: Secondary | ICD-10-CM | POA: Diagnosis not present

## 2019-12-14 DIAGNOSIS — Z8249 Family history of ischemic heart disease and other diseases of the circulatory system: Secondary | ICD-10-CM | POA: Diagnosis not present

## 2019-12-14 DIAGNOSIS — D473 Essential (hemorrhagic) thrombocythemia: Secondary | ICD-10-CM | POA: Diagnosis not present

## 2019-12-14 DIAGNOSIS — Z7982 Long term (current) use of aspirin: Secondary | ICD-10-CM | POA: Diagnosis not present

## 2019-12-14 DIAGNOSIS — D75839 Thrombocytosis, unspecified: Secondary | ICD-10-CM

## 2019-12-14 NOTE — Progress Notes (Signed)
Patient here for follow up. Denies any concerns.  

## 2020-01-13 ENCOUNTER — Encounter: Payer: Self-pay | Admitting: Certified Nurse Midwife

## 2020-01-15 ENCOUNTER — Ambulatory Visit: Payer: PRIVATE HEALTH INSURANCE | Attending: Internal Medicine

## 2020-01-15 DIAGNOSIS — Z23 Encounter for immunization: Secondary | ICD-10-CM

## 2020-01-15 NOTE — Progress Notes (Signed)
   Covid-19 Vaccination Clinic  Name:  [name]    MRN: [id number] DOB: November 22, 1955  01/15/2020  [name] was observed post Covid-19 immunization for 15 minutes without incident. She was provided with Vaccine Information Sheet and instruction to access the V-Safe system.   [name] was instructed to call 911 with any severe reactions post vaccine: Marland Kitchen Difficulty breathing  . Swelling of face and throat  . A fast heartbeat  . A bad rash all over body  . Dizziness and weakness   Immunizations Administered    Name Date Dose VIS Date Route   Pfizer COVID-19 Vaccine 01/15/2020  1:14 PM 0.3 mL 10/02/2019 Intramuscular   Manufacturer: [employer]   Lot: [id number]   [place]: [id number]

## 2020-01-18 ENCOUNTER — Other Ambulatory Visit: Payer: Self-pay

## 2020-01-18 ENCOUNTER — Ambulatory Visit: Payer: PRIVATE HEALTH INSURANCE | Attending: Rheumatology | Admitting: Occupational Therapy

## 2020-01-18 ENCOUNTER — Encounter: Payer: Self-pay | Admitting: Occupational Therapy

## 2020-01-18 DIAGNOSIS — M65332 Trigger finger, left middle finger: Secondary | ICD-10-CM | POA: Insufficient documentation

## 2020-01-18 DIAGNOSIS — M79642 Pain in left hand: Secondary | ICD-10-CM | POA: Diagnosis present

## 2020-01-18 DIAGNOSIS — M25642 Stiffness of left hand, not elsewhere classified: Secondary | ICD-10-CM | POA: Insufficient documentation

## 2020-01-18 DIAGNOSIS — M6281 Muscle weakness (generalized): Secondary | ICD-10-CM | POA: Insufficient documentation

## 2020-01-21 NOTE — Therapy (Signed)
[facility] [address], [place], [address] Phone: [phone]   Fax:  [phone]  Occupational Therapy Evaluation  Patient Details  Name: [name] MRN: [id number] Date of Birth: 06/21/1956 Referring Provider (OT): Tomasita Morrow   Encounter Date: 01/18/2020  OT End of Session - 01/21/20 1644    Visit Number  1    Number of Visits  13    Date for OT Re-Evaluation  03/02/20    OT Start Time  1400    OT Stop Time  1458    OT Time Calculation (min)  58 min    Activity Tolerance  Patient tolerated treatment well    Behavior During Therapy  [facility] for tasks assessed/performed       Past Medical History:  Diagnosis Date  . Abnormal pap   . Anemia   . Anxiety   . History of lupus 1987  . Hypertension   . Low vitamin D level   . Renal insufficiency 1988   kidney biopsy 1988 and 1998- "my kidneys function at like 48%"    Past Surgical History:  Procedure Laterality Date  . BREAST BIOPSY Right 07/08/2017  . BREAST EXCISIONAL BIOPSY Right 08/06/2017  . BREAST EXCISIONAL BIOPSY Right 07/1999  . BREAST LUMPECTOMY WITH RADIOACTIVE SEED LOCALIZATION Right 08/06/2017   Procedure: RIGHT BREAST LUMPECTOMY WITH RADIOACTIVE SEED LOCALIZATION ERAS PATHWAY;  Surgeon: Excell Seltzer, MD;  Location: [place];  Service: General;  Laterality: Right;  . BREAST LUMPECTOMY WITH RADIOACTIVE SEED LOCALIZATION Right 08/19/2018   Procedure: RIGHT BREAST LUMPECTOMY WITH RADIOACTIVE SEED LOCALIZATION ERAS PATHWAY;  Surgeon: Excell Seltzer, MD;  Location: [place];  Service: General;  Laterality: Right;  . BREAST SURGERY  10/00   right breast benign fibroadenoma  . COLONOSCOPY  2012  . COLONOSCOPY WITH PROPOFOL N/A 05/01/2017   Procedure: COLONOSCOPY WITH PROPOFOL;  Surgeon: Christene Lye, MD;  Location: [facility];  Service: Endoscopy;  Laterality: N/A;  . CRYOTHERAPY     abnormal pap  smear  . ESOPHAGOGASTRODUODENOSCOPY (EGD) WITH PROPOFOL N/A 05/01/2017   Procedure: ESOPHAGOGASTRODUODENOSCOPY (EGD) WITH PROPOFOL;  Surgeon: Christene Lye, MD;  Location: [facility];  Service: Endoscopy;  Laterality: N/A;  . NASAL SEPTUM SURGERY  2007  . TUBAL LIGATION  1990    There were no vitals filed for this visit.  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  Increased pain in last few months and decreased ability to perform select daily tasks.    Pertinent History  Patient reports in left hand last sept/oct and her finger got hit by a box  when working on loading a truck at work.  She had a steroid injection in Dec.  History of lupus and arthritic changes in the hand.    Patient Stated Goals  Patient reports she would like to get more motion in her fingers    Currently in Pain?  Yes    Pain Score  2     Pain Location  Hand    Pain Orientation  Left    Pain Descriptors / Indicators  Aching    Pain Type  Acute pain    Pain Onset  More than a month ago    Pain Frequency  Intermittent    Aggravating Factors   picking up heavy items, work tasks    Pain  Relieving Factors  rest    Effect of Pain on Daily Activities  increased difficulty with tasks at home such as opening jars, squeezing out mop and pulling grass and weeds.    Multiple Pain Sites  No        [facility] OT Assessment - 01/21/20 1624      Assessment   Medical Diagnosis  trigger finger, left middle    Referring Provider (OT)  Jefm Bryant, G    Hand Dominance  Right      Balance Screen   Has the patient fallen in the past 6 months  No    Is the patient reluctant to leave their home because of a fear of falling?   No      Home  Environment   Family/patient expects to be discharged to:  Private residence    [place]    Available Help at Discharge  Family    Type of [place] With   Son      Prior Function   Level of Independence  Independent    Vocation  Full time employment    Vocation Requirements  works at [employer]      ADL   ADL comments  Patient reports she is able to perform her basic self care tasks but has difficulty with opening jars and containers, lifting heavy objects, squeezing out mop, pulling weeds and grass.  Performing yardwork      Mobility   Mobility Status  Independent      Written Expression   Dominant Hand  Right      Cognition   Overall Cognitive Status  Within Functional Limits for tasks assessed      Observation/Other Assessments   Focus on Therapeutic Outcomes (FOTO)   59      Sensation   Light Touch  Appears Intact    Stereognosis  Appears Intact    Hot/Cold  Appears Intact    Proprioception  Appears Intact      Coordination   Gross Motor Movements are Fluid and Coordinated  Yes    Fine Motor Movements are Fluid and Coordinated  No      AROM   Right Wrist Extension  65 Degrees    Right Wrist Flexion  78 Degrees    Right Wrist Radial Deviation  20 Degrees    Right Wrist Ulnar Deviation  25 Degrees    Left Wrist Extension  68 Degrees    Left Wrist Flexion  60 Degrees    Left Wrist Radial Deviation  20 Degrees    Left Wrist Ulnar Deviation  17 Degrees      Strength   Right Hand Grip (lbs)  50    Right Hand Lateral Pinch  13 lbs    Right Hand 3 Point Pinch  15 lbs    Left Hand Grip (lbs)  40    Left Hand Lateral Pinch  12 lbs    Left Hand 3 Point Pinch  13 lbs      Right Hand AROM   R Index  MCP 0-90  90 Degrees    R Index PIP 0-100  100 Degrees    R Long  MCP 0-90  90 Degrees    R Long PIP 0-100  100 Degrees    R Ring  MCP 0-90  90 Degrees    R Ring PIP 0-100  100 Degrees    R Little  MCP 0-90  90 Degrees    R Little PIP 0-100  100 Degrees      Left Hand AROM   L Index  MCP 0-90  80 Degrees    L Index PIP 0-100  80 Degrees    L Long  MCP 0-90  70 Degrees    L Long PIP 0-100   85 Degrees    L Ring  MCP 0-90  70 Degrees    L Ring PIP 0-100  85 Degrees    L Little  MCP 0-90  80 Degrees    L Little PIP 0-100  85 Degrees       Left has full fist, full opposition    Treatment Patient seen for use of contrast for edema control in left hand, provided with written instructions to perform as a part of home program.  11 mins, alternating warm/cold this date  Patient instructed on tendon gliding exs with handout to perform 2-3 times a day, avoid composite fisting.    Patient instructed on general joint protection principles and issued handout for additional review at home.  Discussed need to modify work tasks to avoid tight gripping, heavy lifting.                 OT Education - 01/21/20 1642    Education Details  plan of care, role of OT,    Person(s) Educated  Patient    Methods  Explanation;Demonstration    Comprehension  Verbalized understanding;Returned demonstration                 Plan - 01/21/20 1646    Clinical Impression Statement  Patient is a 64 yo female who was referred to OT for evaluation of trigger finger left middle.  Patient reports she received a steroid injection December, pain at times at A1 pulley.  Patient has a history of Lupus, arthritic changes in hand.  Patient has difficulty with work tasks lifting heavy objects, difficulty with opening jars and containers, squeezing out mop, pulling grass, weeds and yard work.  She presents with decreased ROM, decreased strength, pain and decreased ability to perform select daily tasks.  She would benefit from skilled OT to maximize safety and independence in necessary daily tasks at home, work and in the community.    OT Occupational Profile and History  Detailed Assessment- Review of Records and additional review of physical, cognitive, psychosocial history related to current functional performance    Occupational performance deficits (Please refer to evaluation for details):   ADL's;IADL's;Work;Leisure    Body Structure / Function / Physical Skills  ADL;UE functional use;IADL;Pain;Dexterity;FMC;Strength;Edema;ROM    Psychosocial Skills  Environmental  Adaptations;Habits;Routines and Behaviors    Rehab Potential  Good    Clinical Decision Making  Limited treatment options, no task modification necessary    Comorbidities Affecting Occupational Performance:  Presence of comorbidities impacting occupational performance    Comorbidities impacting occupational performance description:  lupus, arthritic changes, job with heavy lifting    Modification or Assistance to Complete Evaluation   No modification of tasks or assist necessary to complete eval    OT Frequency  2x / week    OT Duration  6 weeks    OT Treatment/Interventions  Self-care/ADL training;Cryotherapy;Paraffin;Therapeutic exercise;DME and/or AE instruction;Ultrasound;Fluidtherapy;Neuromuscular education;Manual Therapy;Splinting;Moist Heat;Iontophoresis;Contrast Bath;Energy conservation;Passive range of motion;Therapeutic activities;Patient/family education    Consulted and Agree with Plan of Care  Patient       Patient will benefit from skilled therapeutic intervention in order to improve  the following deficits and impairments:   Body Structure / Function / Physical Skills: ADL, UE functional use, IADL, Pain, Dexterity, FMC, Strength, Edema, ROM   Psychosocial Skills: Environmental  Adaptations, Habits, Routines and Behaviors   Visit Diagnosis: Trigger middle finger of left hand  Stiffness of left hand, not elsewhere classified  Pain in left hand  Muscle weakness (generalized)    Problem List Patient Active Problem List   Diagnosis Date Noted  . Gallbladder polyp 07/30/2018  . Insomnia 06/21/2018  . Vitamin D deficiency 06/21/2018  . Anxiety 06/21/2018  . Chronic renal insufficiency, stage 3 (moderate) 09/07/2016  . Depression (emotion) 08/21/2016  . Systemic lupus erythematosus ([place])  08/21/2016  . Thrombocytosis ([place]) 08/13/2015  . Monocytosis 08/13/2015  . Anemia 11/25/2013  . Chronic kidney disease, stage III (moderate) 02/04/2013  . CKD (chronic kidney disease) stage 3, GFR 30-59 ml/min 02/04/2013  . Hypertension 02/03/2013  . Proteinuria 02/18/2007  . Stage III lupus nephritis (WHO) ([place]) 10/22/1986   Grettell Ransdell T Laterrian Hevener, OTR/L, CLT  Jospeh Mangel 01/21/2020, 5:02 PM  [facility] [address] [place], [address] Phone: [phone]   Fax:  [phone]  Name: JENNEAN GUILLET MRN: [id number] Date of Birth: 1956-06-09

## 2020-02-01 ENCOUNTER — Other Ambulatory Visit: Payer: Self-pay

## 2020-02-01 ENCOUNTER — Ambulatory Visit: Payer: Medicare Other | Attending: Rheumatology | Admitting: Occupational Therapy

## 2020-02-01 DIAGNOSIS — M6281 Muscle weakness (generalized): Secondary | ICD-10-CM | POA: Insufficient documentation

## 2020-02-01 DIAGNOSIS — M79642 Pain in left hand: Secondary | ICD-10-CM | POA: Insufficient documentation

## 2020-02-01 DIAGNOSIS — M25642 Stiffness of left hand, not elsewhere classified: Secondary | ICD-10-CM | POA: Insufficient documentation

## 2020-02-01 DIAGNOSIS — M65332 Trigger finger, left middle finger: Secondary | ICD-10-CM | POA: Diagnosis not present

## 2020-02-01 NOTE — Patient Instructions (Signed)
Tendon glides changed to only gentle AROM pain free  Compression sleeve = silicon sleeve on middle finger during day and another at night time -to tolerance

## 2020-02-01 NOTE — Therapy (Signed)
[facility] [address] [place], [address] Phone: [phone]   Fax:  [phone]  Occupational Therapy Treatment  Patient Details  Name: [name] MRN: [id number] Date of Birth: [birth year] Referring Provider (OT): Tomasita Morrow   Encounter Date: 02/01/2020  OT End of Session - 02/01/20 1533    Visit Number  2    Number of Visits  7    Date for OT Re-Evaluation  03/02/20    OT Start Time  [id number]    OT Stop Time  1514    OT Time Calculation (min)  36 min    Activity Tolerance  Patient tolerated treatment well       Past Medical History:  Diagnosis Date  . Abnormal pap   . Anemia   . Anxiety   . History of lupus 1987  . Hypertension   . Low vitamin D level   . Renal insufficiency 1988   kidney biopsy 1988 and 1998- "my kidneys function at like 48%"    Past Surgical History:  Procedure Laterality Date  . BREAST BIOPSY Right 07/08/2017  . BREAST EXCISIONAL BIOPSY Right 08/06/2017  . BREAST EXCISIONAL BIOPSY Right 07/1999  . BREAST LUMPECTOMY WITH RADIOACTIVE SEED LOCALIZATION Right 08/06/2017   Procedure: RIGHT BREAST LUMPECTOMY WITH RADIOACTIVE SEED LOCALIZATION ERAS PATHWAY;  Surgeon: Excell Seltzer, MD;  Location: [place];  Service: General;  Laterality: Right;  . BREAST LUMPECTOMY WITH RADIOACTIVE SEED LOCALIZATION Right 08/19/2018   Procedure: RIGHT BREAST LUMPECTOMY WITH RADIOACTIVE SEED LOCALIZATION ERAS PATHWAY;  Surgeon: Excell Seltzer, MD;  Location: [place];  Service: General;  Laterality: Right;  . BREAST SURGERY  10/00   right breast benign fibroadenoma  . COLONOSCOPY  2012  . COLONOSCOPY WITH PROPOFOL N/A 05/01/2017   Procedure: COLONOSCOPY WITH PROPOFOL;  Surgeon: Christene Lye, MD;  Location: [facility];  Service: Endoscopy;  Laterality: N/A;  . CRYOTHERAPY     abnormal pap smear  . ESOPHAGOGASTRODUODENOSCOPY (EGD) WITH PROPOFOL N/A  05/01/2017   Procedure: ESOPHAGOGASTRODUODENOSCOPY (EGD) WITH PROPOFOL;  Surgeon: Christene Lye, MD;  Location: [facility];  Service: Endoscopy;  Laterality: N/A;  . NASAL SEPTUM SURGERY  2007  . TUBAL LIGATION  1990    There were no vitals filed for this visit.  Subjective Assessment - 02/01/20 1531    Subjective   I don't really have pain -just tight feeling and hard time open like a jar - swelling in that middle joint of middle finger still    Pertinent History  Patient reports in left hand last sept/oct and her finger got hit by a box  when working on loading a truck at work.  She had a steroid injection in Dec.  History of lupus and arthritic changes in the hand.    Patient Stated Goals  Patient reports she would like to get more motion in her fingers    Currently in Pain?  Yes    Pain Score  1     Pain Location  Finger (Comment which one)    Pain Orientation  Left    Pain Descriptors / Indicators  Tender;Tightness    Pain Type  Acute pain    Pain Onset  More than a month ago    Pain Frequency  Intermittent         No tenderness on A1pulley of L 3rd - little tenderness at PIP - but increase edema of more than 0.5 cm -  and strain and pull with intrinsic fist  MC flexion 85 and PIP 95 at 3rd  Pt fitted with silicon sleeve to wear day time and then another for night time - to use for this week to decrease edema             OT Treatments/Exercises (OP) - 02/01/20 0001      LUE Contrast Bath   Time  8 minutes    Comments  prior to ROM and soft tissue      done some soft tissue mobs to PIP, volar PIP and lateral bands prior to AROM  Review tendon glides , opposition doing O  And tapping of digits  10 reps  2-3 x day   Joint protection reviewed again - hand out provided last time with HEP - reinforce repect for pain , enlarge handles, avoid tight and sustained grip , and use larger joints         OT Education - 02/01/20 1533    Education Details   progress and changes to HEP    Person(s) Educated  Patient    Methods  Explanation;Demonstration    Comprehension  Verbalized understanding;Returned demonstration          OT Long Term Goals - 01/21/20 1702      OT LONG TERM GOAL #1   Title  Patient will be independent in home program for edema control, exercises.    Baseline  no current program    Time  6    Period  Weeks    Status  New    Target Date  03/02/20      OT LONG TERM GOAL #2   Title  Patient will demonstrate understanding of joint protection principles and integrate at least 3 principles into home/work tasks.    Baseline  no current knowledge.    Time  6    Period  Weeks    Status  New    Target Date  03/02/20      OT LONG TERM GOAL #3   Title  Patient will improve ROM of left hand with pain 2 or less to complete ADL tasks.    Baseline  limited ROM in left hand at eval    Time  6    Period  Weeks    Status  New    Target Date  03/02/20            Plan - 02/01/20 1534    Clinical Impression Statement  Pt show no tenderness over A1pulley for L 3rd - AROM at [facility] 85 and PIP 95 - only pull - but do cont to have edema in 3rd - add compression sleeve for digits and reinforce not to force AROM and to cont joint protection/modifications    OT Occupational Profile and History  Detailed Assessment- Review of Records and additional review of physical, cognitive, psychosocial history related to current functional performance    Occupational performance deficits (Please refer to evaluation for details):  ADL's;IADL's;Work;Leisure    Body Structure / Function / Physical Skills  ADL;UE functional use;IADL;Pain;Dexterity;FMC;Strength;Edema;ROM    Psychosocial Skills  Environmental  Adaptations;Habits;Routines and Behaviors    Clinical Decision Making  Limited treatment options, no task modification necessary    Comorbidities Affecting Occupational Performance:  Presence of comorbidities impacting occupational performance     Comorbidities impacting occupational performance description:  lupus, arthritic changes, job with heavy lifting    Modification or Assistance to Complete Evaluation   No modification of  tasks or assist necessary to complete eval    OT Frequency  1x / week    OT Duration  6 weeks    OT Treatment/Interventions  Self-care/ADL training;Cryotherapy;Paraffin;Therapeutic exercise;DME and/or AE instruction;Ultrasound;Fluidtherapy;Neuromuscular education;Manual Therapy;Splinting;Moist Heat;Iontophoresis;Contrast Bath;Energy conservation;Passive range of motion;Therapeutic activities;Patient/family education    Plan  assess progress with HEP    OT Home Exercise Plan  see pt instruction    Consulted and Agree with Plan of Care  Patient       Patient will benefit from skilled therapeutic intervention in order to improve the following deficits and impairments:   Body Structure / Function / Physical Skills: ADL, UE functional use, IADL, Pain, Dexterity, FMC, Strength, Edema, ROM   Psychosocial Skills: Environmental  Adaptations, Habits, Routines and Behaviors   Visit Diagnosis: Trigger middle finger of left hand  Stiffness of left hand, not elsewhere classified  Pain in left hand  Muscle weakness (generalized)    Problem List Patient Active Problem List   Diagnosis Date Noted  . Gallbladder polyp 07/30/2018  . Insomnia 06/21/2018  . Vitamin D deficiency 06/21/2018  . Anxiety 06/21/2018  . Chronic renal insufficiency, stage 3 (moderate) 09/07/2016  . Depression (emotion) 08/21/2016  . Systemic lupus erythematosus ([place]) 08/21/2016  . Thrombocytosis ([place]) 08/13/2015  . Monocytosis 08/13/2015  . Anemia 11/25/2013  . Chronic kidney disease, stage III (moderate) 02/04/2013  . CKD (chronic kidney disease) stage 3, GFR 30-59 ml/min 02/04/2013  . Hypertension 02/03/2013  . Proteinuria 02/18/2007  . Stage III lupus nephritis (WHO) ([place]) 10/22/1986    Rosalyn Gess OTR/l,CLT 02/01/2020,  3:37 PM  [facility] [address] [place], [address] Phone: [phone]   Fax:  [phone]  Name: [name] MRN: [id number] Date of Birth: 07/05/1956

## 2020-02-08 ENCOUNTER — Ambulatory Visit: Payer: Medicare Other | Admitting: Occupational Therapy

## 2020-02-08 ENCOUNTER — Other Ambulatory Visit: Payer: Self-pay

## 2020-02-08 DIAGNOSIS — M65332 Trigger finger, left middle finger: Secondary | ICD-10-CM

## 2020-02-08 DIAGNOSIS — M25642 Stiffness of left hand, not elsewhere classified: Secondary | ICD-10-CM

## 2020-02-08 DIAGNOSIS — M6281 Muscle weakness (generalized): Secondary | ICD-10-CM

## 2020-02-08 DIAGNOSIS — M79642 Pain in left hand: Secondary | ICD-10-CM

## 2020-02-08 NOTE — Therapy (Signed)
[facility] [address] [place], [address] Phone: [phone]   Fax:  [phone]  Occupational Therapy Treatment  Patient Details  Name: [name] MRN: [id number] Date of Birth: 11-17-1955 Referring Provider (OT): Tomasita Morrow   Encounter Date: 02/08/2020  OT End of Session - 02/08/20 0949    Visit Number  3    Number of Visits  7    Date for OT Re-Evaluation  03/02/20    OT Start Time  0845    OT Stop Time  0941    OT Time Calculation (min)  56 min    Activity Tolerance  Patient tolerated treatment well    Behavior During Therapy  [facility] for tasks assessed/performed       Past Medical History:  Diagnosis Date  . Abnormal pap   . Anemia   . Anxiety   . History of lupus 1987  . Hypertension   . Low vitamin D level   . Renal insufficiency 1988   kidney biopsy 1988 and 1998- "my kidneys function at like 48%"    Past Surgical History:  Procedure Laterality Date  . BREAST BIOPSY Right 07/08/2017  . BREAST EXCISIONAL BIOPSY Right 08/06/2017  . BREAST EXCISIONAL BIOPSY Right 07/1999  . BREAST LUMPECTOMY WITH RADIOACTIVE SEED LOCALIZATION Right 08/06/2017   Procedure: RIGHT BREAST LUMPECTOMY WITH RADIOACTIVE SEED LOCALIZATION ERAS PATHWAY;  Surgeon: Excell Seltzer, MD;  Location: [place];  Service: General;  Laterality: Right;  . BREAST LUMPECTOMY WITH RADIOACTIVE SEED LOCALIZATION Right 08/19/2018   Procedure: RIGHT BREAST LUMPECTOMY WITH RADIOACTIVE SEED LOCALIZATION ERAS PATHWAY;  Surgeon: Excell Seltzer, MD;  Location: [place];  Service: General;  Laterality: Right;  . BREAST SURGERY  10/00   right breast benign fibroadenoma  . COLONOSCOPY  2012  . COLONOSCOPY WITH PROPOFOL N/A 05/01/2017   Procedure: COLONOSCOPY WITH PROPOFOL;  Surgeon: Christene Lye, MD;  Location: [facility];  Service: Endoscopy;  Laterality: N/A;  . CRYOTHERAPY     abnormal pap smear   . ESOPHAGOGASTRODUODENOSCOPY (EGD) WITH PROPOFOL N/A 05/01/2017   Procedure: ESOPHAGOGASTRODUODENOSCOPY (EGD) WITH PROPOFOL;  Surgeon: Christene Lye, MD;  Location: [facility];  Service: Endoscopy;  Laterality: N/A;  . NASAL SEPTUM SURGERY  2007  . TUBAL LIGATION  1990    There were no vitals filed for this visit.  Subjective Assessment - 02/08/20 0946    Subjective   Doing better- no pain - middle finger just stiff and tight - but better than last week - the compression sleeves just gets dirty    Pertinent History  Patient reports in left hand last sept/oct and her finger got hit by a box  when working on loading a truck at work.  She had a steroid injection in Dec.  History of lupus and arthritic changes in the hand.    Patient Stated Goals  Patient reports she would like to get more motion in her fingers    Currently in Pain?  Yes    Pain Score  3     Pain Location  Finger (Comment which one)   3rd   Pain Orientation  Left    Pain Descriptors / Indicators  Tender    Pain Type  Acute pain    Pain Onset  More than a month ago         [facility] OT Assessment - 02/08/20 0001      Left Hand AROM   L  Ring  MCP 0-90  88 Degrees    L Ring PIP 0-100  100 Degrees         No tenderness on A1pulley of L 3rd - little tenderness at PIP 3/10  - but cont to have some  increase edema 0.5 cm - and cont to have some tightness and stiffness with intrinsic fist but no pain   MC flexion 90 and PIP  at 3rd in session - better than last week Pt fitted with silicon sleeve again this date to wear day time and then another for night time -assist pt to locate them on Internet to order - to use for 3 wks  As well as pt had questions on heating pad and cold packs- info provided and research with her         OT Treatments/Exercises (OP) - 02/08/20 0001      Ultrasound   Ultrasound Location  L 3rd PIP     Ultrasound Parameters  3.3MHZ, 20%, 1.0 intensity     Ultrasound Goals  Edema       LUE Contrast Bath   Time  8 minutes    Comments  prior to ROM and sofit tissue        done some soft tissue mobs to PIP, volar PIP and lateral bands prior to AROM  Review tendon glides - DO NOT force intrinsic fist , opposition doing O  And tapping of digits  10 reps  2-3 x day  Pain free   Joint protection reviewed again - hand out provided last time with HEP - reinforce respect for pain , enlarge handles, avoid tight and sustained grip , and use larger joints        OT Education - 02/08/20 0949    Education Details  progress and homeprogram    Person(s) Educated  Patient    Methods  Explanation;Demonstration    Comprehension  Verbalized understanding;Returned demonstration          OT Long Term Goals - 01/21/20 1702      OT LONG TERM GOAL #1   Title  Patient will be independent in home program for edema control, exercises.    Baseline  no current program    Time  6    Period  Weeks    Status  New    Target Date  03/02/20      OT LONG TERM GOAL #2   Title  Patient will demonstrate understanding of joint protection principles and integrate at least 3 principles into home/work tasks.    Baseline  no current knowledge.    Time  6    Period  Weeks    Status  New    Target Date  03/02/20      OT LONG TERM GOAL #3   Title  Patient will improve ROM of left hand with pain 2 or less to complete ADL tasks.    Baseline  limited ROM in left hand at eval    Time  6    Period  Weeks    Status  New    Target Date  03/02/20            Plan - 02/08/20 0949    Clinical Impression Statement  Pt present this date with AROM composite fist WNL -but still decrease PIP flexion to 90 during intrinsic fist , pt to not force PIP/DIP flexion - still 0.5 edema - pt to cont with contrast , compression sleeve and AROM  tendon glides - no PROM or stretches and follow up in 3 wks - cont with joint protection prinicples    OT Occupational Profile and History  Detailed Assessment-  Review of Records and additional review of physical, cognitive, psychosocial history related to current functional performance    Occupational performance deficits (Please refer to evaluation for details):  ADL's;IADL's;Work;Leisure    Body Structure / Function / Physical Skills  ADL;UE functional use;IADL;Pain;Dexterity;FMC;Strength;Edema;ROM    Psychosocial Skills  Environmental  Adaptations;Habits;Routines and Behaviors    Rehab Potential  Good    Clinical Decision Making  Limited treatment options, no task modification necessary    Comorbidities Affecting Occupational Performance:  Presence of comorbidities impacting occupational performance    Comorbidities impacting occupational performance description:  lupus, arthritic changes, job with heavy lifting    Modification or Assistance to Complete Evaluation   No modification of tasks or assist necessary to complete eval    OT Frequency  --   3 wks   OT Duration  --   3 wks   OT Treatment/Interventions  Self-care/ADL training;Cryotherapy;Paraffin;Therapeutic exercise;DME and/or AE instruction;Ultrasound;Fluidtherapy;Neuromuscular education;Manual Therapy;Splinting;Moist Heat;Iontophoresis;Contrast Bath;Energy conservation;Passive range of motion;Therapeutic activities;Patient/family education    Plan  assess progress with HEP    OT Home Exercise Plan  see pt instruction    Consulted and Agree with Plan of Care  Patient       Patient will benefit from skilled therapeutic intervention in order to improve the following deficits and impairments:   Body Structure / Function / Physical Skills: ADL, UE functional use, IADL, Pain, Dexterity, FMC, Strength, Edema, ROM   Psychosocial Skills: Environmental  Adaptations, Habits, Routines and Behaviors   Visit Diagnosis: Trigger middle finger of left hand  Stiffness of left hand, not elsewhere classified  Pain in left hand  Muscle weakness (generalized)    Problem List Patient Active  Problem List   Diagnosis Date Noted  . Gallbladder polyp 07/30/2018  . Insomnia 06/21/2018  . Vitamin D deficiency 06/21/2018  . Anxiety 06/21/2018  . Chronic renal insufficiency, stage 3 (moderate) 09/07/2016  . Depression (emotion) 08/21/2016  . Systemic lupus erythematosus ([place]) 08/21/2016  . Thrombocytosis ([place]) 08/13/2015  . Monocytosis 08/13/2015  . Anemia 11/25/2013  . Chronic kidney disease, stage III (moderate) 02/04/2013  . CKD (chronic kidney disease) stage 3, GFR 30-59 ml/min 02/04/2013  . Hypertension 02/03/2013  . Proteinuria 02/18/2007  . Stage III lupus nephritis (WHO) ([place]) 10/22/1986    Rosalyn Gess OTR/l,CLT 02/08/2020, 9:55 AM  [facility] [address], [place], [address] Phone: [phone]   Fax:  [phone]  Name: BAYLEA NUNNELLEY MRN: [id number] Date of Birth: 09/24/1956

## 2020-02-08 NOTE — Patient Instructions (Signed)
Same - do not force PROM intrinsic fist

## 2020-02-10 ENCOUNTER — Ambulatory Visit: Payer: PRIVATE HEALTH INSURANCE | Attending: Internal Medicine

## 2020-02-10 DIAGNOSIS — Z23 Encounter for immunization: Secondary | ICD-10-CM

## 2020-02-10 NOTE — Progress Notes (Signed)
   Covid-19 Vaccination Clinic  Name:  [name]    MRN: [id number] DOB: 1956/07/20  02/10/2020  [name] was observed post Covid-19 immunization for 15 minutes without incident. She was provided with Vaccine Information Sheet and instruction to access the V-Safe system.   [name] was instructed to call 911 with any severe reactions post vaccine: Marland Kitchen Difficulty breathing  . Swelling of face and throat  . A fast heartbeat  . A bad rash all over body  . Dizziness and weakness   Immunizations Administered    Name Date Dose VIS Date Route   Pfizer COVID-19 Vaccine 02/10/2020 12:32 PM 0.3 mL 12/16/2018 Intramuscular   Manufacturer: [employer], [employer]   Lot: [id number]   [place]: [id number]

## 2020-02-29 ENCOUNTER — Ambulatory Visit: Payer: Medicare Other | Attending: Rheumatology | Admitting: Occupational Therapy

## 2020-02-29 ENCOUNTER — Other Ambulatory Visit: Payer: Self-pay

## 2020-02-29 DIAGNOSIS — M65332 Trigger finger, left middle finger: Secondary | ICD-10-CM | POA: Insufficient documentation

## 2020-02-29 DIAGNOSIS — M25642 Stiffness of left hand, not elsewhere classified: Secondary | ICD-10-CM | POA: Insufficient documentation

## 2020-02-29 DIAGNOSIS — M6281 Muscle weakness (generalized): Secondary | ICD-10-CM | POA: Diagnosis not present

## 2020-02-29 DIAGNOSIS — M79642 Pain in left hand: Secondary | ICD-10-CM | POA: Diagnosis not present

## 2020-02-29 NOTE — Patient Instructions (Signed)
same

## 2020-02-29 NOTE — Therapy (Signed)
[place] [facility] [address] [place], [address] Phone: [phone]   Fax:  [phone]  Occupational Therapy Treatment  Patient Details  Name: [name] MRN: [id number] Date of Birth: 05-27-1956 Referring Provider (OT): Tomasita Morrow   Encounter Date: 02/29/2020  OT End of Session - 02/29/20 1601    Visit Number  4    Number of Visits  7    Date for OT Re-Evaluation  03/02/20    OT Start Time  1506    OT Stop Time  1545    OT Time Calculation (min)  39 min    Activity Tolerance  Patient tolerated treatment well    Behavior During Therapy  [facility] for tasks assessed/performed       Past Medical History:  Diagnosis Date  . Abnormal pap   . Anemia   . Anxiety   . History of lupus 1987  . Hypertension   . Low vitamin D level   . Renal insufficiency 1988   kidney biopsy 1988 and 1998- "my kidneys function at like 48%"    Past Surgical History:  Procedure Laterality Date  . BREAST BIOPSY Right 07/08/2017  . BREAST EXCISIONAL BIOPSY Right 08/06/2017  . BREAST EXCISIONAL BIOPSY Right 07/1999  . BREAST LUMPECTOMY WITH RADIOACTIVE SEED LOCALIZATION Right 08/06/2017   Procedure: RIGHT BREAST LUMPECTOMY WITH RADIOACTIVE SEED LOCALIZATION ERAS PATHWAY;  Surgeon: Excell Seltzer, MD;  Location: [place];  Service: General;  Laterality: Right;  . BREAST LUMPECTOMY WITH RADIOACTIVE SEED LOCALIZATION Right 08/19/2018   Procedure: RIGHT BREAST LUMPECTOMY WITH RADIOACTIVE SEED LOCALIZATION ERAS PATHWAY;  Surgeon: Excell Seltzer, MD;  Location: [place];  Service: General;  Laterality: Right;  . BREAST SURGERY  10/00   right breast benign fibroadenoma  . COLONOSCOPY  2012  . COLONOSCOPY WITH PROPOFOL N/A 05/01/2017   Procedure: COLONOSCOPY WITH PROPOFOL;  Surgeon: Christene Lye, MD;  Location: [facility];  Service: Endoscopy;  Laterality: N/A;  . CRYOTHERAPY     abnormal pap smear   . ESOPHAGOGASTRODUODENOSCOPY (EGD) WITH PROPOFOL N/A 05/01/2017   Procedure: ESOPHAGOGASTRODUODENOSCOPY (EGD) WITH PROPOFOL;  Surgeon: Christene Lye, MD;  Location: [facility];  Service: Endoscopy;  Laterality: N/A;  . NASAL SEPTUM SURGERY  2007  . TUBAL LIGATION  1990    There were no vitals filed for this visit.  Subjective Assessment - 02/29/20 1549    Subjective   I did okay- did order some of those compression sleeves and did not force the motion - pain still good- still some swelling -but coming down    Pertinent History  Patient reports in left hand last sept/oct and her finger got hit by a box  when working on loading a truck at work.  She had a steroid injection in Dec.  History of lupus and arthritic changes in the hand.    Patient Stated Goals  Patient reports she would like to get more motion in her fingers    Currently in Pain?  No/denies         No tenderness on A1pulley of L 3rd - little tenderness at PIP 1/10  - edema cont to decrease with wearing of compression sleeve = no only 0.3  Only  tightness and stiffness with intrinsic fist but no pain  MC flexion 90 and PIP 96 degrees this date  - better than last time  Pt did order some compression on internet - wear day time and then  another for night time -assist pt to locate them on Internet to order - different type - to use for 3 wks              OT Treatments/Exercises (OP) - 02/29/20 0001      LUE Contrast Bath   Time  8 minutes    Comments  prior to soft tissue          done some soft tissue mobs to PIP, volar PIP and lateral bands prior to AROM  Using graston tool nr 2 this date = brushing over volar digits and palm Review tendon glides - DO NOT force intrinsic fist , opposition doing O  And tapping of digits  10 reps  2-3 x day  Pain free       OT Education - 02/29/20 1601    Education Details  progress and homeprogram    Person(s) Educated  Patient    Methods   Explanation;Demonstration    Comprehension  Verbalized understanding;Returned demonstration          OT Long Term Goals - 01/21/20 1702      OT LONG TERM GOAL #1   Title  Patient will be independent in home program for edema control, exercises.    Baseline  no current program    Time  6    Period  Weeks    Status  New    Target Date  03/02/20      OT LONG TERM GOAL #2   Title  Patient will demonstrate understanding of joint protection principles and integrate at least 3 principles into home/work tasks.    Baseline  no current knowledge.    Time  6    Period  Weeks    Status  New    Target Date  03/02/20      OT LONG TERM GOAL #3   Title  Patient will improve ROM of left hand with pain 2 or less to complete ADL tasks.    Baseline  limited ROM in left hand at eval    Time  6    Period  Weeks    Status  New    Target Date  03/02/20            Plan - 02/29/20 1603    Clinical Impression Statement  Pt cont to make progress in edema, AROM ,pain - no tenderness over A1pulley - Flexion at [facility] 90 and PIP 96 - tenderness at PIP very little -pt to cont with HEP for month and if no issue do not have to return    OT Occupational Profile and History  Detailed Assessment- Review of Records and additional review of physical, cognitive, psychosocial history related to current functional performance    Occupational performance deficits (Please refer to evaluation for details):  ADL's;IADL's;Work;Leisure    Body Structure / Function / Physical Skills  ADL;UE functional use;IADL;Pain;Dexterity;FMC;Strength;Edema;ROM    Psychosocial Skills  Environmental  Adaptations;Habits;Routines and Behaviors    Rehab Potential  Good    Clinical Decision Making  Limited treatment options, no task modification necessary    Comorbidities Affecting Occupational Performance:  Presence of comorbidities impacting occupational performance    Comorbidities impacting occupational performance description:   lupus, arthritic changes, job with heavy lifting    Modification or Assistance to Complete Evaluation   No modification of tasks or assist necessary to complete eval    OT Frequency  Monthly    OT Duration  4 weeks  OT Treatment/Interventions  Self-care/ADL training;Cryotherapy;Paraffin;Therapeutic exercise;DME and/or AE instruction;Ultrasound;Fluidtherapy;Neuromuscular education;Manual Therapy;Splinting;Moist Heat;Iontophoresis;Contrast Bath;Energy conservation;Passive range of motion;Therapeutic activities;Patient/family education    Plan  assess progress with HEP    OT Home Exercise Plan  see pt instruction    Consulted and Agree with Plan of Care  Patient       Patient will benefit from skilled therapeutic intervention in order to improve the following deficits and impairments:   Body Structure / Function / Physical Skills: ADL, UE functional use, IADL, Pain, Dexterity, FMC, Strength, Edema, ROM   Psychosocial Skills: Environmental  Adaptations, Habits, Routines and Behaviors   Visit Diagnosis: Trigger middle finger of left hand  Stiffness of left hand, not elsewhere classified  Pain in left hand  Muscle weakness (generalized)    Problem List Patient Active Problem List   Diagnosis Date Noted  . Gallbladder polyp 07/30/2018  . Insomnia 06/21/2018  . Vitamin D deficiency 06/21/2018  . Anxiety 06/21/2018  . Chronic renal insufficiency, stage 3 (moderate) 09/07/2016  . Depression (emotion) 08/21/2016  . Systemic lupus erythematosus ([place]) 08/21/2016  . Thrombocytosis ([place]) 08/13/2015  . Monocytosis 08/13/2015  . Anemia 11/25/2013  . Chronic kidney disease, stage III (moderate) 02/04/2013  . CKD (chronic kidney disease) stage 3, GFR 30-59 ml/min 02/04/2013  . Hypertension 02/03/2013  . Proteinuria 02/18/2007  . Stage III lupus nephritis (WHO) ([place]) 10/22/1986    Rosalyn Gess  OTR/l,CLT 02/29/2020, 4:05 PM  [place] [facility] [address] [place], [address] Phone: [phone]   Fax:  [phone]  Name: [name] MRN: [id number] Date of Birth: 1956/01/20

## 2020-03-14 DIAGNOSIS — M531 Cervicobrachial syndrome: Secondary | ICD-10-CM | POA: Diagnosis not present

## 2020-03-14 DIAGNOSIS — M436 Torticollis: Secondary | ICD-10-CM | POA: Diagnosis not present

## 2020-03-14 DIAGNOSIS — M9901 Segmental and somatic dysfunction of cervical region: Secondary | ICD-10-CM | POA: Diagnosis not present

## 2020-03-14 DIAGNOSIS — M9903 Segmental and somatic dysfunction of lumbar region: Secondary | ICD-10-CM | POA: Diagnosis not present

## 2020-03-14 DIAGNOSIS — M9902 Segmental and somatic dysfunction of thoracic region: Secondary | ICD-10-CM | POA: Diagnosis not present

## 2020-03-18 DIAGNOSIS — I10 Essential (primary) hypertension: Secondary | ICD-10-CM | POA: Diagnosis not present

## 2020-03-31 NOTE — Progress Notes (Signed)
64 y.o. G12P1001 Widowed Caucasian female here for annual exam.    Denies vaginal bleeding.   States her lupus is not active.   Retired and works part time at [employer].   Had her Covid vaccine.   PCP: Merrie Roof, PA-C   Nephrologist: Marian Sorrow - [facility]  Patient's last menstrual period was 08/22/2008.           Sexually active: Yes.    The current method of family planning is post menopausal status.    Exercising: Yes.    treadmill, cycling and walks dog daily Smoker:  no  Health Maintenance: Pap: 03-25-17 Neg:Neg HR HPV, 04-01-18 Neg History of abnormal Pap: Yes, Hx of cryotherapy years ago. MMG: 07-06-19 3D/Neg/density C/BiRads1 Colonoscopy: 2018 normal per patient;?5 years BMD: 07-06-19  Result :Normal TDaP:  2019 Gardasil:   no HIV: 2017 NR Hep C: 2017 Neg Screening Labs:  PCP.    reports that she has never smoked. She has never used smokeless tobacco. She reports current alcohol use of about 4.0 - 6.0 standard drinks of alcohol per week. She reports current drug use. Drug: Marijuana.  Past Medical History:  Diagnosis Date  . Abnormal pap   . Anemia   . Anxiety   . History of lupus 1987  . Hypertension   . Low vitamin D level   . Renal insufficiency 1988   kidney biopsy 1988 and 1998- "my kidneys function at like 48%"    Past Surgical History:  Procedure Laterality Date  . BREAST BIOPSY Right 07/08/2017  . BREAST EXCISIONAL BIOPSY Right 08/06/2017  . BREAST EXCISIONAL BIOPSY Right 07/1999  . BREAST LUMPECTOMY WITH RADIOACTIVE SEED LOCALIZATION Right 08/06/2017   Procedure: RIGHT BREAST LUMPECTOMY WITH RADIOACTIVE SEED LOCALIZATION ERAS PATHWAY;  Surgeon: Excell Seltzer, MD;  Location: [place];  Service: General;  Laterality: Right;  . BREAST LUMPECTOMY WITH RADIOACTIVE SEED LOCALIZATION Right 08/19/2018   Procedure: RIGHT BREAST LUMPECTOMY WITH RADIOACTIVE SEED LOCALIZATION ERAS PATHWAY;  Surgeon: Excell Seltzer, MD;  Location: [place];   Service: General;  Laterality: Right;  . BREAST SURGERY  10/00   right breast benign fibroadenoma  . COLONOSCOPY  2012  . COLONOSCOPY WITH PROPOFOL N/A 05/01/2017   Procedure: COLONOSCOPY WITH PROPOFOL;  Surgeon: Christene Lye, MD;  Location: [facility];  Service: Endoscopy;  Laterality: N/A;  . CRYOTHERAPY     abnormal pap smear  . ESOPHAGOGASTRODUODENOSCOPY (EGD) WITH PROPOFOL N/A 05/01/2017   Procedure: ESOPHAGOGASTRODUODENOSCOPY (EGD) WITH PROPOFOL;  Surgeon: Christene Lye, MD;  Location: [facility];  Service: Endoscopy;  Laterality: N/A;  . NASAL SEPTUM SURGERY  2007  . TUBAL LIGATION  1990    Current Outpatient Medications  Medication Sig Dispense Refill  . ALPRAZolam (XANAX) 0.25 MG tablet Take 0.25 mg by mouth daily as needed for anxiety.     Marland Kitchen aspirin 81 MG tablet Take 81 mg by mouth daily.    . calcium carbonate (OS-CAL) 600 MG TABS tablet Take 600 mg by mouth daily with breakfast.     . Cholecalciferol (VITAMIN D3 PO) Take 1 tablet by mouth daily.     . enalapril (VASOTEC) 10 MG tablet Take 2 tablets by mouth 2 (two) times daily.    . fluticasone (FLONASE) 50 MCG/ACT nasal spray Place 1 spray into both nostrils as needed.     . hydroxychloroquine (PLAQUENIL) 200 MG tablet Take 200 mg by mouth every other day.     . traMADol (ULTRAM) 50 MG tablet Take 50 mg  by mouth every 6 (six) hours as needed for moderate pain.    . traZODone (DESYREL) 50 MG tablet Take 50 mg by mouth at bedtime as needed for sleep.      No current facility-administered medications for this visit.    Family History  Problem Relation Age of Onset  . Cancer Father        oral cancer  . Cancer Maternal Grandmother        ovarian cancer  . Hypertension Maternal Grandmother   . Diabetes Maternal Grandfather   . Diabetes Paternal Aunt   . Breast cancer Neg Hx     Review of Systems  Musculoskeletal: Positive for joint swelling (sees rheumatologist).  All other systems reviewed  and are negative.   Exam:   BP 120/70   Pulse 76   Temp (!) 97.3 F (36.3 C) (Temporal)   Resp 16   Ht 5\' 5"  (1.651 m)   Wt 181 lb (82.1 kg)   LMP 08/22/2008   BMI 30.12 kg/m     General appearance: alert, cooperative and appears stated age Head: normocephalic, without obvious abnormality, atraumatic Neck: no adenopathy, supple, symmetrical, trachea midline and thyroid normal to inspection and palpation Lungs: clear to auscultation bilaterally Breasts: normal appearance, no masses or tenderness, No nipple retraction or dimpling, No nipple discharge or bleeding, No axillary adenopathy Heart: regular rate and rhythm Abdomen: soft, non-tender; no masses, no organomegaly Extremities: extremities normal, atraumatic, no cyanosis or edema Skin: skin color, texture, turgor normal. No rashes or lesions Lymph nodes: cervical, supraclavicular, and axillary nodes normal. Neurologic: grossly normal  Pelvic: External genitalia:  no lesions              No abnormal inguinal nodes palpated.              Urethra:  normal appearing urethra with no masses, tenderness or lesions              Bartholins and Skenes: normal                 Vagina: normal appearing vagina with normal color and discharge, no lesions              Cervix: no lesions              Pap taken: Yes.   Bimanual Exam:  Uterus:  normal size, contour, position, consistency, mobility, non-tender              Adnexa: no mass, fullness, tenderness              Rectal exam: Yes.  .  Confirms.              Anus:  normal sphincter tone, no lesions  Chaperone was present for exam.  Assessment:   Well woman visit with normal exam. Hx breast biopsies.  Benign. Remote hx of cryotherapy to cervix.  Chronic renal disease.  Lupus.   Plan: Mammogram screening discussed. Self breast awareness reviewed. Pap and HR HPV as above. Guidelines for Calcium, Vitamin D, regular exercise program including cardiovascular and weight bearing  exercise.   Follow up annually and prn.   After visit summary provided.

## 2020-04-01 ENCOUNTER — Other Ambulatory Visit: Payer: Self-pay

## 2020-04-04 ENCOUNTER — Ambulatory Visit (INDEPENDENT_AMBULATORY_CARE_PROVIDER_SITE_OTHER): Payer: Medicare Other | Admitting: Obstetrics and Gynecology

## 2020-04-04 ENCOUNTER — Other Ambulatory Visit (HOSPITAL_COMMUNITY)
Admission: RE | Admit: 2020-04-04 | Discharge: 2020-04-04 | Disposition: A | Discharge status: Home / Self Care | Payer: Medicare Other | Source: Ambulatory Visit | Attending: Obstetrics and Gynecology | Admitting: Obstetrics and Gynecology

## 2020-04-04 ENCOUNTER — Other Ambulatory Visit: Payer: Self-pay | Admitting: Obstetrics and Gynecology

## 2020-04-04 ENCOUNTER — Ambulatory Visit: Payer: PRIVATE HEALTH INSURANCE | Admitting: Certified Nurse Midwife

## 2020-04-04 ENCOUNTER — Encounter: Payer: Self-pay | Admitting: Obstetrics and Gynecology

## 2020-04-04 ENCOUNTER — Other Ambulatory Visit: Payer: Self-pay

## 2020-04-04 VITALS — BP 120/70 | HR 76 | Temp 97.3°F | Resp 16 | Ht 65.0 in | Wt 181.0 lb

## 2020-04-04 DIAGNOSIS — Z01419 Encounter for gynecological examination (general) (routine) without abnormal findings: Secondary | ICD-10-CM | POA: Insufficient documentation

## 2020-04-04 DIAGNOSIS — Z1231 Encounter for screening mammogram for malignant neoplasm of breast: Secondary | ICD-10-CM

## 2020-04-04 NOTE — Patient Instructions (Signed)

## 2020-04-06 LAB — CYTOLOGY - PAP
Diagnosis: NEGATIVE
Diagnosis: REACTIVE

## 2020-07-01 DIAGNOSIS — M3214 Glomerular disease in systemic lupus erythematosus: Secondary | ICD-10-CM | POA: Diagnosis not present

## 2020-07-01 DIAGNOSIS — N183 Chronic kidney disease, stage 3 unspecified: Secondary | ICD-10-CM | POA: Diagnosis not present

## 2020-07-01 DIAGNOSIS — N1832 Chronic kidney disease, stage 3b: Secondary | ICD-10-CM | POA: Diagnosis not present

## 2020-07-01 DIAGNOSIS — I10 Essential (primary) hypertension: Secondary | ICD-10-CM | POA: Diagnosis not present

## 2020-07-04 DIAGNOSIS — R799 Abnormal finding of blood chemistry, unspecified: Secondary | ICD-10-CM | POA: Diagnosis not present

## 2020-07-04 DIAGNOSIS — M3214 Glomerular disease in systemic lupus erythematosus: Secondary | ICD-10-CM | POA: Diagnosis not present

## 2020-07-04 DIAGNOSIS — N1832 Chronic kidney disease, stage 3b: Secondary | ICD-10-CM | POA: Diagnosis not present

## 2020-07-04 DIAGNOSIS — I10 Essential (primary) hypertension: Secondary | ICD-10-CM | POA: Diagnosis not present

## 2020-07-06 ENCOUNTER — Ambulatory Visit
Admission: RE | Admit: 2020-07-06 | Discharge: 2020-07-06 | Disposition: A | Discharge status: Home / Self Care | Payer: Medicare Other | Source: Ambulatory Visit | Attending: Obstetrics and Gynecology | Admitting: Obstetrics and Gynecology

## 2020-07-06 ENCOUNTER — Other Ambulatory Visit: Payer: Self-pay

## 2020-07-06 DIAGNOSIS — Z1231 Encounter for screening mammogram for malignant neoplasm of breast: Secondary | ICD-10-CM | POA: Diagnosis not present

## 2020-07-11 ENCOUNTER — Other Ambulatory Visit: Payer: Self-pay | Admitting: Obstetrics and Gynecology

## 2020-07-11 DIAGNOSIS — M216X2 Other acquired deformities of left foot: Secondary | ICD-10-CM | POA: Diagnosis not present

## 2020-07-11 DIAGNOSIS — R928 Other abnormal and inconclusive findings on diagnostic imaging of breast: Secondary | ICD-10-CM

## 2020-07-11 DIAGNOSIS — L851 Acquired keratosis [keratoderma] palmaris et plantaris: Secondary | ICD-10-CM | POA: Diagnosis not present

## 2020-07-11 DIAGNOSIS — M79672 Pain in left foot: Secondary | ICD-10-CM | POA: Diagnosis not present

## 2020-07-18 DIAGNOSIS — M9903 Segmental and somatic dysfunction of lumbar region: Secondary | ICD-10-CM | POA: Diagnosis not present

## 2020-07-18 DIAGNOSIS — M9904 Segmental and somatic dysfunction of sacral region: Secondary | ICD-10-CM | POA: Diagnosis not present

## 2020-07-18 DIAGNOSIS — M9902 Segmental and somatic dysfunction of thoracic region: Secondary | ICD-10-CM | POA: Diagnosis not present

## 2020-07-22 ENCOUNTER — Other Ambulatory Visit: Payer: Self-pay

## 2020-07-22 ENCOUNTER — Ambulatory Visit: Payer: Medicare Other

## 2020-07-22 ENCOUNTER — Ambulatory Visit
Admission: RE | Admit: 2020-07-22 | Discharge: 2020-07-22 | Disposition: A | Discharge status: Home / Self Care | Payer: Medicare Other | Source: Ambulatory Visit | Attending: Obstetrics and Gynecology | Admitting: Obstetrics and Gynecology

## 2020-07-22 DIAGNOSIS — R928 Other abnormal and inconclusive findings on diagnostic imaging of breast: Secondary | ICD-10-CM

## 2020-07-25 DIAGNOSIS — H8309 Labyrinthitis, unspecified ear: Secondary | ICD-10-CM | POA: Diagnosis not present

## 2020-08-22 DIAGNOSIS — H2513 Age-related nuclear cataract, bilateral: Secondary | ICD-10-CM | POA: Diagnosis not present

## 2020-08-22 DIAGNOSIS — M3210 Systemic lupus erythematosus, organ or system involvement unspecified: Secondary | ICD-10-CM | POA: Diagnosis not present

## 2020-08-22 DIAGNOSIS — H5213 Myopia, bilateral: Secondary | ICD-10-CM | POA: Diagnosis not present

## 2020-08-22 DIAGNOSIS — H43812 Vitreous degeneration, left eye: Secondary | ICD-10-CM | POA: Diagnosis not present

## 2020-08-22 DIAGNOSIS — Z79899 Other long term (current) drug therapy: Secondary | ICD-10-CM | POA: Diagnosis not present

## 2020-09-21 DIAGNOSIS — H5213 Myopia, bilateral: Secondary | ICD-10-CM | POA: Diagnosis not present

## 2020-09-21 DIAGNOSIS — Z79899 Other long term (current) drug therapy: Secondary | ICD-10-CM | POA: Diagnosis not present

## 2020-09-21 DIAGNOSIS — H2513 Age-related nuclear cataract, bilateral: Secondary | ICD-10-CM | POA: Diagnosis not present

## 2020-09-21 DIAGNOSIS — M3210 Systemic lupus erythematosus, organ or system involvement unspecified: Secondary | ICD-10-CM | POA: Diagnosis not present

## 2020-09-21 DIAGNOSIS — H43812 Vitreous degeneration, left eye: Secondary | ICD-10-CM | POA: Diagnosis not present

## 2020-10-18 DIAGNOSIS — M778 Other enthesopathies, not elsewhere classified: Secondary | ICD-10-CM | POA: Diagnosis not present

## 2020-10-18 DIAGNOSIS — L84 Corns and callosities: Secondary | ICD-10-CM | POA: Diagnosis not present

## 2020-10-18 DIAGNOSIS — L851 Acquired keratosis [keratoderma] palmaris et plantaris: Secondary | ICD-10-CM | POA: Diagnosis not present

## 2020-10-18 DIAGNOSIS — D2372 Other benign neoplasm of skin of left lower limb, including hip: Secondary | ICD-10-CM | POA: Diagnosis not present

## 2020-10-18 DIAGNOSIS — M216X2 Other acquired deformities of left foot: Secondary | ICD-10-CM | POA: Diagnosis not present

## 2020-11-09 DIAGNOSIS — M9901 Segmental and somatic dysfunction of cervical region: Secondary | ICD-10-CM | POA: Diagnosis not present

## 2020-11-09 DIAGNOSIS — M9904 Segmental and somatic dysfunction of sacral region: Secondary | ICD-10-CM | POA: Diagnosis not present

## 2020-11-09 DIAGNOSIS — M5387 Other specified dorsopathies, lumbosacral region: Secondary | ICD-10-CM | POA: Diagnosis not present

## 2020-11-09 DIAGNOSIS — M9906 Segmental and somatic dysfunction of lower extremity: Secondary | ICD-10-CM | POA: Diagnosis not present

## 2020-11-09 DIAGNOSIS — M4603 Spinal enthesopathy, cervicothoracic region: Secondary | ICD-10-CM | POA: Diagnosis not present

## 2020-11-09 DIAGNOSIS — M7601 Gluteal tendinitis, right hip: Secondary | ICD-10-CM | POA: Diagnosis not present

## 2020-11-09 DIAGNOSIS — M9903 Segmental and somatic dysfunction of lumbar region: Secondary | ICD-10-CM | POA: Diagnosis not present

## 2020-11-09 DIAGNOSIS — M9902 Segmental and somatic dysfunction of thoracic region: Secondary | ICD-10-CM | POA: Diagnosis not present

## 2020-11-24 DIAGNOSIS — M5387 Other specified dorsopathies, lumbosacral region: Secondary | ICD-10-CM | POA: Diagnosis not present

## 2020-11-24 DIAGNOSIS — M4603 Spinal enthesopathy, cervicothoracic region: Secondary | ICD-10-CM | POA: Diagnosis not present

## 2020-11-24 DIAGNOSIS — M9901 Segmental and somatic dysfunction of cervical region: Secondary | ICD-10-CM | POA: Diagnosis not present

## 2020-11-24 DIAGNOSIS — M9902 Segmental and somatic dysfunction of thoracic region: Secondary | ICD-10-CM | POA: Diagnosis not present

## 2020-11-24 DIAGNOSIS — M9904 Segmental and somatic dysfunction of sacral region: Secondary | ICD-10-CM | POA: Diagnosis not present

## 2020-11-24 DIAGNOSIS — M9903 Segmental and somatic dysfunction of lumbar region: Secondary | ICD-10-CM | POA: Diagnosis not present

## 2020-11-24 DIAGNOSIS — M7601 Gluteal tendinitis, right hip: Secondary | ICD-10-CM | POA: Diagnosis not present

## 2020-11-24 DIAGNOSIS — M9906 Segmental and somatic dysfunction of lower extremity: Secondary | ICD-10-CM | POA: Diagnosis not present

## 2020-12-07 ENCOUNTER — Other Ambulatory Visit: Payer: Self-pay

## 2020-12-07 DIAGNOSIS — D75839 Thrombocytosis, unspecified: Secondary | ICD-10-CM

## 2020-12-07 DIAGNOSIS — D72821 Monocytosis (symptomatic): Secondary | ICD-10-CM

## 2020-12-08 ENCOUNTER — Inpatient Hospital Stay: Payer: Medicare Other | Attending: Hematology and Oncology

## 2020-12-08 DIAGNOSIS — I10 Essential (primary) hypertension: Secondary | ICD-10-CM | POA: Insufficient documentation

## 2020-12-08 DIAGNOSIS — M329 Systemic lupus erythematosus, unspecified: Secondary | ICD-10-CM | POA: Diagnosis not present

## 2020-12-08 DIAGNOSIS — Z8249 Family history of ischemic heart disease and other diseases of the circulatory system: Secondary | ICD-10-CM | POA: Insufficient documentation

## 2020-12-08 DIAGNOSIS — Z7982 Long term (current) use of aspirin: Secondary | ICD-10-CM | POA: Insufficient documentation

## 2020-12-08 DIAGNOSIS — D649 Anemia, unspecified: Secondary | ICD-10-CM | POA: Diagnosis not present

## 2020-12-08 DIAGNOSIS — D75839 Thrombocytosis, unspecified: Secondary | ICD-10-CM

## 2020-12-08 DIAGNOSIS — Z79899 Other long term (current) drug therapy: Secondary | ICD-10-CM | POA: Insufficient documentation

## 2020-12-08 DIAGNOSIS — Z8041 Family history of malignant neoplasm of ovary: Secondary | ICD-10-CM | POA: Insufficient documentation

## 2020-12-08 DIAGNOSIS — D72821 Monocytosis (symptomatic): Secondary | ICD-10-CM

## 2020-12-08 DIAGNOSIS — Z833 Family history of diabetes mellitus: Secondary | ICD-10-CM | POA: Insufficient documentation

## 2020-12-08 DIAGNOSIS — D75838 Other thrombocytosis: Secondary | ICD-10-CM | POA: Insufficient documentation

## 2020-12-08 LAB — CBC WITH DIFFERENTIAL/PLATELET
Abs Immature Granulocytes: 0.04 10*3/uL (ref 0.00–0.07)
Basophils Absolute: 0.1 10*3/uL (ref 0.0–0.1)
Basophils Relative: 1 %
Eosinophils Absolute: 0.3 10*3/uL (ref 0.0–0.5)
Eosinophils Relative: 2 %
HCT: 31.5 % — ABNORMAL LOW (ref 36.0–46.0)
Hemoglobin: 10.5 g/dL — ABNORMAL LOW (ref 12.0–15.0)
Immature Granulocytes: 0 %
Lymphocytes Relative: 27 %
Lymphs Abs: 3 10*3/uL (ref 0.7–4.0)
MCH: 32.2 pg (ref 26.0–34.0)
MCHC: 33.3 g/dL (ref 30.0–36.0)
MCV: 96.6 fL (ref 80.0–100.0)
Monocytes Absolute: 1.4 10*3/uL — ABNORMAL HIGH (ref 0.1–1.0)
Monocytes Relative: 12 %
Neutro Abs: 6.4 10*3/uL (ref 1.7–7.7)
Neutrophils Relative %: 58 %
Platelets: 529 10*3/uL — ABNORMAL HIGH (ref 150–400)
RBC: 3.26 MIL/uL — ABNORMAL LOW (ref 3.87–5.11)
RDW: 13.4 % (ref 11.5–15.5)
WBC: 11.1 10*3/uL — ABNORMAL HIGH (ref 4.0–10.5)
nRBC: 0 % (ref 0.0–0.2)

## 2020-12-08 LAB — URIC ACID: Uric Acid, Serum: 7.1 mg/dL (ref 2.5–7.1)

## 2020-12-08 LAB — COMPREHENSIVE METABOLIC PANEL
ALT: 10 U/L (ref 0–44)
AST: 18 U/L (ref 15–41)
Albumin: 4 g/dL (ref 3.5–5.0)
Alkaline Phosphatase: 66 U/L (ref 38–126)
Anion gap: 8 (ref 5–15)
BUN: 23 mg/dL (ref 8–23)
CO2: 27 mmol/L (ref 22–32)
Calcium: 9.2 mg/dL (ref 8.9–10.3)
Chloride: 106 mmol/L (ref 98–111)
Creatinine, Ser: 1.11 mg/dL — ABNORMAL HIGH (ref 0.44–1.00)
GFR, Estimated: 55 mL/min — ABNORMAL LOW (ref >60)
Glucose, Bld: 81 mg/dL (ref 70–99)
Potassium: 4.3 mmol/L (ref 3.5–5.1)
Sodium: 141 mmol/L (ref 135–145)
Total Bilirubin: 0.7 mg/dL (ref 0.3–1.2)
Total Protein: 7.2 g/dL (ref 6.5–8.1)

## 2020-12-08 LAB — LACTATE DEHYDROGENASE: LDH: 168 U/L (ref 98–192)

## 2020-12-08 NOTE — Progress Notes (Signed)
[facility]  [address] [place], [place] [address] Phone: [phone]  Fax: [phone]   Clinic Day:  12/12/2020  Referring physician: Volney American,*  Chief Complaint: [name] is a 65 y.o. female with systemic lupus erythematosis (SLE), chronic thrombocytosis and monocytosis who is seen for 1 year assessment.  HPI: The patient was last seen in the hematology clinic on 12/14/2019. At that time, she was doing well.  She denied any B symptoms, early satiety, fatigue, infections, or bruising/bleeding.  Exam revealed no adenopathy or hepatosplenomegaly. [facility] labs revealed a hematocrit of 34.6, hemoglobin 12.1, platelets 534,000, WBC 11,300. Creatinine was 1.14 (CrCl 51 ml/min).  The patient saw Dr. Marian Sorrow on 07/04/2020. Her enalapril had been increased to 20 mg BID; she frequently forgot to take her evening dose. She was to continue Plaquenil 200 mg QOD. Follow-up was planned for 6 months.  Bilateral screening mammogram on 07/06/2020 recommended further evaluation for possible asymmetry in the left breast.  Left diagnostic mammogram on 07/22/2020 revealed no evidence of malignancy.  Labs on 12/08/2020 revealed a hematocrit of 31.5, hemoglobin 10.5, MCV 96.6, platelets 529,000, WBC 11,100. Creatinine was 1.11 (CrCl 55 ml/min). Uric acid was 7.1. LDH was 168.  During the interim, she has been "alright." She feels that she is pretty healthy. She has occasional mild hot flashes at night. Her arthritis is stable. She is stiff in the mornings. She sees a Restaurant manager, fast food for lower back pain which has helped. She denies fevers, lumps, bumps, excess bruising, and bleeding.   We discussed the details of a bone marrow biopsy including the steps of the procedure and risks. The patient would like to think about it because she has several trips planned in the upcoming weeks.   Past Medical History:  Diagnosis Date  . Abnormal pap   .  Anemia   . Anxiety   . History of lupus 1987  . Hypertension   . Low vitamin D level   . Renal insufficiency 1988   kidney biopsy 1988 and 1998- "my kidneys function at like 48%"    Past Surgical History:  Procedure Laterality Date  . BREAST BIOPSY Right 07/08/2017  . BREAST EXCISIONAL BIOPSY Right 08/06/2017  . BREAST EXCISIONAL BIOPSY Right 07/1999  . BREAST LUMPECTOMY WITH RADIOACTIVE SEED LOCALIZATION Right 08/06/2017   Procedure: RIGHT BREAST LUMPECTOMY WITH RADIOACTIVE SEED LOCALIZATION ERAS PATHWAY;  Surgeon: Excell Seltzer, MD;  Location: [place];  Service: General;  Laterality: Right;  . BREAST LUMPECTOMY WITH RADIOACTIVE SEED LOCALIZATION Right 08/19/2018   Procedure: RIGHT BREAST LUMPECTOMY WITH RADIOACTIVE SEED LOCALIZATION ERAS PATHWAY;  Surgeon: Excell Seltzer, MD;  Location: [place];  Service: General;  Laterality: Right;  . BREAST SURGERY  10/00   right breast benign fibroadenoma  . COLONOSCOPY  2012  . COLONOSCOPY WITH PROPOFOL N/A 05/01/2017   Procedure: COLONOSCOPY WITH PROPOFOL;  Surgeon: Christene Lye, MD;  Location: [facility];  Service: Endoscopy;  Laterality: N/A;  . CRYOTHERAPY     abnormal pap smear  . ESOPHAGOGASTRODUODENOSCOPY (EGD) WITH PROPOFOL N/A 05/01/2017   Procedure: ESOPHAGOGASTRODUODENOSCOPY (EGD) WITH PROPOFOL;  Surgeon: Christene Lye, MD;  Location: [facility];  Service: Endoscopy;  Laterality: N/A;  . NASAL SEPTUM SURGERY  2007  . TUBAL LIGATION  1990    Family History  Problem Relation Age of Onset  . Cancer Father        oral cancer  . Cancer Maternal Grandmother  ovarian cancer  . Hypertension Maternal Grandmother   . Diabetes Maternal Grandfather   . Diabetes Paternal Aunt   . Breast cancer Neg Hx     Social History:  reports that she has never smoked. She has never used smokeless tobacco. She reports current alcohol use of about 4.0 - 6.0 standard drinks of alcohol per week.  She reports current drug use. Drug: Marijuana. Her husband had stage IIIB lung cancer.  He died December 11, 2016.  She has a part time job. The patient is alone today.  Allergies:  Allergies  Allergen Reactions  . Elemental Sulfur Hives  . Sulfa Antibiotics Hives, Itching and Rash    Current Medications: Current Outpatient Medications  Medication Sig Dispense Refill  . ALPRAZolam (XANAX) 0.25 MG tablet Take 0.25 mg by mouth daily as needed for anxiety.     Marland Kitchen aspirin 81 MG tablet Take 81 mg by mouth daily.    . calcium carbonate (OS-CAL) 600 MG TABS tablet Take 600 mg by mouth daily with breakfast.     . Cholecalciferol (VITAMIN D3 PO) Take 1 tablet by mouth daily.     . enalapril (VASOTEC) 10 MG tablet Take 2 tablets by mouth 2 (two) times daily.    . hydroxychloroquine (PLAQUENIL) 200 MG tablet Take 200 mg by mouth every other day.     . traMADol (ULTRAM) 50 MG tablet Take 50 mg by mouth every 6 (six) hours as needed for moderate pain.    . fluticasone (FLONASE) 50 MCG/ACT nasal spray Place 1 spray into both nostrils as needed.  (Patient not taking: Reported on 12/12/2020)    . traZODone (DESYREL) 50 MG tablet Take 50 mg by mouth at bedtime as needed for sleep.  (Patient not taking: Reported on 12/12/2020)     No current facility-administered medications for this visit.    Review of Systems  Constitutional: Positive for diaphoresis (occasional mild hot flashes). Negative for chills, fever, malaise/fatigue and weight loss (stable).       Doing "alright"  HENT: Negative.  Negative for congestion, ear pain, hearing loss, nosebleeds, sinus pain, sore throat and tinnitus.   Eyes: Negative.  Negative for blurred vision, double vision and photophobia.  Respiratory: Negative.  Negative for cough, hemoptysis, sputum production and shortness of breath.   Cardiovascular: Negative.  Negative for chest pain, orthopnea and leg swelling.  Gastrointestinal: Negative.  Negative for abdominal pain, blood in  stool, constipation, diarrhea, heartburn, melena, nausea and vomiting.  Genitourinary: Negative.  Negative for dysuria, flank pain, frequency, hematuria and urgency.  Musculoskeletal: Positive for back pain (lower) and joint pain (arthritis; right knee, fingers). Negative for myalgias and neck pain.  Skin: Negative.  Negative for itching and rash.  Neurological: Negative.  Negative for dizziness, tingling, sensory change, focal weakness, weakness and headaches.  Endo/Heme/Allergies: Negative.  Does not bruise/bleed easily.  Psychiatric/Behavioral: Negative.  Negative for depression and memory loss. The patient is not nervous/anxious and does not have insomnia.   All other systems reviewed and are negative.  Performance status (ECOG): 1  Vitals Blood pressure (!) 149/74, pulse 71, temperature (!) 96.8 F (36 C), temperature source Tympanic, resp. rate 20, height _0  (1.651 m), weight 184 lb 15.5 oz (83.9 kg), last menstrual period 08/22/2008.   Physical Exam Vitals and nursing note reviewed.  Constitutional:      General: She is not in acute distress.    Appearance: She is well-developed. She is not diaphoretic.  HENT:     Head: Normocephalic  and atraumatic.     Mouth/Throat:     Pharynx: No oropharyngeal exudate.  Eyes:     General: No scleral icterus.    Conjunctiva/sclera: Conjunctivae normal.     Pupils: Pupils are equal, round, and reactive to light.     Comments: Glasses. Blue eyes.  Cardiovascular:     Rate and Rhythm: Normal rate and regular rhythm.     Heart sounds: Normal heart sounds. No murmur heard.   Pulmonary:     Effort: Pulmonary effort is normal. No respiratory distress.     Breath sounds: Normal breath sounds. No wheezing or rales.  Chest:     Chest wall: No tenderness.  Breasts:     Right: No supraclavicular adenopathy.     Left: No supraclavicular adenopathy.    Abdominal:     General: Bowel sounds are normal. There is no distension.     Palpations:  Abdomen is soft. There is no mass.     Tenderness: There is no abdominal tenderness. There is no guarding or rebound.  Musculoskeletal:        General: No tenderness. Normal range of motion.     Cervical back: Normal range of motion and neck supple.  Lymphadenopathy:     Head:     Right side of head: No preauricular, posterior auricular or occipital adenopathy.     Left side of head: No preauricular, posterior auricular or occipital adenopathy.     Cervical: No cervical adenopathy.     Upper Body:     Right upper body: No supraclavicular adenopathy.     Left upper body: No supraclavicular adenopathy.     Lower Body: No right inguinal adenopathy. No left inguinal adenopathy.  Skin:    General: Skin is warm and dry.     Coloration: Skin is not pale.     Findings: No erythema or rash.  Neurological:     Mental Status: She is alert and oriented to person, place, and time.  Psychiatric:        Behavior: Behavior normal.        Thought Content: Thought content normal.        Judgment: Judgment normal.      No visits with results within 3 Day(s) from this visit.  Latest known visit with results is:  Appointment on 12/08/2020  Component Date Value Ref Range Status  . Uric Acid, Serum 12/08/2020 7.1  2.5 - 7.1 mg/dL Final   Performed at [facility], [address] [place], [place] [address]  . LDH 12/08/2020 168  98 - 192 U/L Final   Performed at [facility], [address] [place],  [address]  . Sodium 12/08/2020 141  135 - 145 mmol/L Final  . Potassium 12/08/2020 4.3  3.5 - 5.1 mmol/L Final  . Chloride 12/08/2020 106  98 - 111 mmol/L Final  . CO2 12/08/2020 27  22 - 32 mmol/L Final  . Glucose, Bld 12/08/2020 81  70 - 99 mg/dL Final   Glucose reference range applies only to samples taken after fasting for at least 8 hours.  . BUN 12/08/2020 23  8 - 23 mg/dL Final  . Creatinine, Ser 12/08/2020 1.11* 0.44 - 1.00 mg/dL Final  . Calcium 12/08/2020 9.2  8.9 -  10.3 mg/dL Final  . Total Protein 12/08/2020 7.2  6.5 - 8.1 g/dL Final  . Albumin 12/08/2020 4.0  3.5 - 5.0 g/dL Final  . AST 12/08/2020 18  15 - 41 U/L  Final  . ALT 12/08/2020 10  0 - 44 U/L Final  . Alkaline Phosphatase 12/08/2020 66  38 - 126 U/L Final  . Total Bilirubin 12/08/2020 0.7  0.3 - 1.2 mg/dL Final  . GFR, Estimated 12/08/2020 55* >60 mL/min Final   Comment: (NOTE) Calculated using the CKD-EPI Creatinine Equation (2021)   . Anion gap 12/08/2020 8  5 - 15 Final   Performed at [facility], [address] [place], [place] [address]  . WBC 12/08/2020 11.1* 4.0 - 10.5 K/uL Final  . RBC 12/08/2020 3.26* 3.87 - 5.11 MIL/uL Final  . Hemoglobin 12/08/2020 10.5* 12.0 - 15.0 g/dL Final  . HCT 12/08/2020 31.5* 36.0 - 46.0 % Final  . MCV 12/08/2020 96.6  80.0 - 100.0 fL Final  . MCH 12/08/2020 32.2  26.0 - 34.0 pg Final  . MCHC 12/08/2020 33.3  30.0 - 36.0 g/dL Final  . RDW 12/08/2020 13.4  11.5 - 15.5 % Final  . Platelets 12/08/2020 529* 150 - 400 K/uL Final  . nRBC 12/08/2020 0.0  0.0 - 0.2 % Final  . Neutrophils Relative % 12/08/2020 58  % Final  . Neutro Abs 12/08/2020 6.4  1.7 - 7.7 K/uL Final  . Lymphocytes Relative 12/08/2020 27  % Final  . Lymphs Abs 12/08/2020 3.0  0.7 - 4.0 K/uL Final  . Monocytes Relative 12/08/2020 12  % Final  . Monocytes Absolute 12/08/2020 1.4* 0.1 - 1.0 K/uL Final  . Eosinophils Relative 12/08/2020 2  % Final  . Eosinophils Absolute 12/08/2020 0.3  0.0 - 0.5 K/uL Final  . Basophils Relative 12/08/2020 1  % Final  . Basophils Absolute 12/08/2020 0.1  0.0 - 0.1 K/uL Final  . Immature Granulocytes 12/08/2020 0  % Final  . Abs Immature Granulocytes 12/08/2020 0.04  0.00 - 0.07 K/uL Final   Performed at [facility], [address] [place], [place] [address]    Assessment:  KAYMARIE WYNN is a 65 y.o. female with chronic thrombocytosis and monocytosis since 2012.  She may have an early myeloproliferative disorder such as CMML  without current need for treatment.  She has a mild normocytic anemia.   Work-up in 200[address] revealed a normal LDH, BCR-ABL, JAK2 V617F mutation, ESR, and iron studies.  CALR mutation and MPL from [facility] on 07/16/2017 were negative.  Work-up on 12/18/2017 at [facility] was negative for JAK2 V617F, exon 12-15, and BCR-ABL.  Flow cytometry on 12/10/2018 showed no circulating blasts detected. There was no monoclonal B cell population detected. There was no significant immunophenotypic abnormalities detected.   She has a history of systemic lupus erythematosis (SLE) complicated by class III lupus nephritis.  Creatinine was 1.2 on 06/13/2018.  She is on Plaquenil 200 mg QOD.  ANA was negative on 01/21/2018.  C3 (116) and C4 (28) were normal on 01/21/2018.  She has early arthritis.  Her last colonoscopy was in 2009 or 2013.  Diet is fair.  The patient received the [place] COVID-19 vaccine on 01/15/2020 and 02/10/2020.  Symptomatically, she denies any fevers, sweats or weight loss.  She has occasional mild hot flashes at night.  Exam reveals no adenopathy or hepatosplenomegaly.  WBC 11,100 with an [place] of 6400.  Absolute monocyte count is 1400.  Plan: 1.   Labs today: CBC with diff, CMP, LDH, uric acid. 2.   Chronic thrombocytosis and monocytosis             Etiology of monocytosis is unclear.  She has had a  persistent monocyte count > 1000.               Work-up for a myeloproliferative disorder has been negative (JAK2, CALR, MPL, BCR-ABL).               Flow cytometry on 12/10/2018 revealed no blasts, monoclonal B cell population or significant abnormalities.             Patient has an underlying systemic illness (SLE) which may be causing a reactive thrombocytosis and monocytosis.             Readdress consideration of marrow aspirate/biopsy to clarify peripheral blood counts. 3.   Discuss bone marrow aspirate- patient considering 4.   RN:  Please call patient in 1 week regarding bone marrow decision. 5.    RTC in 6 months for MD assess and labs (CBC with diff, CMP, LDH, uric acid).  I discussed the assessment and treatment plan with the patient.  The patient was provided an opportunity to ask questions and all were answered.  The patient agreed with the plan and demonstrated an understanding of the instructions.  The patient was advised to call back if the symptoms worsen or if the condition fails to improve as anticipated.  I provided 20 minutes of face-to-face time during this this encounter and > 50% was spent counseling as documented under my assessment and plan.  An additional 5 minutes were spent reviewing her chart (Epic and Care Everywhere) including notes, labs, and imaging studies.    Lequita Asal, MD, PhD    12/12/2020, 1:45 PM  I, Mirian Mo Tufford, am acting as Education administrator for [employer]. Mike Gip, MD, PhD.  I, Ferlin Fairhurst C. Mike Gip, MD, have reviewed the above documentation for accuracy and completeness, and I agree with the above.

## 2020-12-12 ENCOUNTER — Encounter: Payer: Self-pay | Admitting: Hematology and Oncology

## 2020-12-12 ENCOUNTER — Other Ambulatory Visit: Payer: Self-pay

## 2020-12-12 ENCOUNTER — Inpatient Hospital Stay: Payer: Medicare Other | Admitting: Hematology and Oncology

## 2020-12-12 VITALS — BP 149/74 | HR 71 | Temp 96.8°F | Resp 20 | Ht 65.0 in | Wt 185.0 lb

## 2020-12-12 DIAGNOSIS — Z8249 Family history of ischemic heart disease and other diseases of the circulatory system: Secondary | ICD-10-CM | POA: Diagnosis not present

## 2020-12-12 DIAGNOSIS — I10 Essential (primary) hypertension: Secondary | ICD-10-CM | POA: Diagnosis not present

## 2020-12-12 DIAGNOSIS — D75839 Thrombocytosis, unspecified: Secondary | ICD-10-CM

## 2020-12-12 DIAGNOSIS — Z833 Family history of diabetes mellitus: Secondary | ICD-10-CM | POA: Diagnosis not present

## 2020-12-12 DIAGNOSIS — D75838 Other thrombocytosis: Secondary | ICD-10-CM | POA: Diagnosis not present

## 2020-12-12 DIAGNOSIS — Z79899 Other long term (current) drug therapy: Secondary | ICD-10-CM | POA: Diagnosis not present

## 2020-12-12 DIAGNOSIS — M329 Systemic lupus erythematosus, unspecified: Secondary | ICD-10-CM | POA: Diagnosis not present

## 2020-12-12 DIAGNOSIS — D72821 Monocytosis (symptomatic): Secondary | ICD-10-CM | POA: Diagnosis not present

## 2020-12-12 DIAGNOSIS — D649 Anemia, unspecified: Secondary | ICD-10-CM | POA: Diagnosis not present

## 2020-12-12 DIAGNOSIS — Z7982 Long term (current) use of aspirin: Secondary | ICD-10-CM | POA: Diagnosis not present

## 2020-12-13 ENCOUNTER — Telehealth: Payer: Self-pay | Admitting: *Deleted

## 2020-12-13 NOTE — Telephone Encounter (Signed)
Returned patient's phone call per her request. Patient requested that I add omeprazole to her medication list. She has me to review her labs from 2020 and 2021.   plt count  On 12/04/19 was 534; plt count on 12/03/2018 was  595. Patient thanked me for this information.  Patient has not made up her mind about the bone marrow biopsy at this time. She will be traveling next week and will call our office back in the next 2 weeks with her decision for bone marrow.

## 2020-12-13 NOTE — Telephone Encounter (Signed)
-----   Message from Hewitt Blade sent at 12/13/2020  2:37 PM EST ----- Regarding: patient has lab questions Patient called regarding lab questions --- requested to talk to you since you were here yesterday...   Attempting to set her up for MyChart - declined   Thank you!

## 2020-12-16 DIAGNOSIS — M3214 Glomerular disease in systemic lupus erythematosus: Secondary | ICD-10-CM | POA: Diagnosis not present

## 2020-12-16 DIAGNOSIS — I10 Essential (primary) hypertension: Secondary | ICD-10-CM | POA: Diagnosis not present

## 2020-12-16 DIAGNOSIS — N183 Chronic kidney disease, stage 3 unspecified: Secondary | ICD-10-CM | POA: Diagnosis not present

## 2020-12-22 DIAGNOSIS — M3214 Glomerular disease in systemic lupus erythematosus: Secondary | ICD-10-CM | POA: Diagnosis not present

## 2020-12-22 DIAGNOSIS — N1832 Chronic kidney disease, stage 3b: Secondary | ICD-10-CM | POA: Diagnosis not present

## 2020-12-22 DIAGNOSIS — I10 Essential (primary) hypertension: Secondary | ICD-10-CM | POA: Diagnosis not present

## 2020-12-28 DIAGNOSIS — M9904 Segmental and somatic dysfunction of sacral region: Secondary | ICD-10-CM | POA: Diagnosis not present

## 2020-12-28 DIAGNOSIS — M9902 Segmental and somatic dysfunction of thoracic region: Secondary | ICD-10-CM | POA: Diagnosis not present

## 2020-12-28 DIAGNOSIS — M9906 Segmental and somatic dysfunction of lower extremity: Secondary | ICD-10-CM | POA: Diagnosis not present

## 2020-12-28 DIAGNOSIS — M9903 Segmental and somatic dysfunction of lumbar region: Secondary | ICD-10-CM | POA: Diagnosis not present

## 2020-12-29 DIAGNOSIS — M329 Systemic lupus erythematosus, unspecified: Secondary | ICD-10-CM | POA: Diagnosis not present

## 2020-12-29 DIAGNOSIS — N183 Chronic kidney disease, stage 3 unspecified: Secondary | ICD-10-CM | POA: Diagnosis not present

## 2020-12-29 DIAGNOSIS — D75839 Thrombocytosis, unspecified: Secondary | ICD-10-CM | POA: Diagnosis not present

## 2021-01-05 DIAGNOSIS — D75839 Thrombocytosis, unspecified: Secondary | ICD-10-CM | POA: Diagnosis not present

## 2021-01-05 DIAGNOSIS — D473 Essential (hemorrhagic) thrombocythemia: Secondary | ICD-10-CM | POA: Diagnosis not present

## 2021-01-05 DIAGNOSIS — N183 Chronic kidney disease, stage 3 unspecified: Secondary | ICD-10-CM | POA: Diagnosis not present

## 2021-01-05 DIAGNOSIS — M329 Systemic lupus erythematosus, unspecified: Secondary | ICD-10-CM | POA: Diagnosis not present

## 2021-01-20 HISTORY — PX: BONE MARROW BIOPSY: SHX199

## 2021-01-30 ENCOUNTER — Telehealth: Payer: Self-pay | Admitting: Hematology and Oncology

## 2021-01-30 NOTE — Telephone Encounter (Signed)
Pt would like a call to discuss having a Biopsy scheduled. Was recommended by Dr. Mike Gip in Feb, but patient wanted to think about it. Pt is also requesting additional labs prior to having the Biopsy completed.

## 2021-02-02 ENCOUNTER — Other Ambulatory Visit: Payer: Self-pay | Admitting: Hematology and Oncology

## 2021-02-02 ENCOUNTER — Telehealth: Payer: Self-pay | Admitting: *Deleted

## 2021-02-02 DIAGNOSIS — D72821 Monocytosis (symptomatic): Secondary | ICD-10-CM

## 2021-02-02 DIAGNOSIS — D649 Anemia, unspecified: Secondary | ICD-10-CM

## 2021-02-02 NOTE — Telephone Encounter (Signed)
  Bone marrow orders in.  Please schedule.  M

## 2021-02-02 NOTE — Telephone Encounter (Addendum)
Orders for BM bx faxed. Tried calling pt to let her know. No answer and VM was full.

## 2021-02-02 NOTE — Telephone Encounter (Signed)
Please advise Dr. Mike Gip

## 2021-02-02 NOTE — Telephone Encounter (Signed)
Patient calling again asking about scheduling the BM biopsy. Stats she called Monday and has not heard anything back from [place]. Please advise

## 2021-02-09 NOTE — Telephone Encounter (Signed)
Dr. Corcoran, Patient is scheduled for bone marrow biopsy on 02/16/21.  Would you like a f/u appt scheduled to discuss results?  She is scheduled for lab/MD on 06/13/21 at this time. 

## 2021-02-09 NOTE — Telephone Encounter (Signed)
New order form filled out and will be faxed to specialty scheduling.

## 2021-02-09 NOTE — Telephone Encounter (Signed)
02/09/2021 I went ahead an refaxed so [name] would have it. Called pt and informed her of biopsy date/time/prep details. She confirmed the appt and wanted to know if someone would call her with the results afterwards.  SRW

## 2021-02-13 NOTE — Progress Notes (Signed)
Patient on schedule for BMB 02/16/2021,called and spoke with patient on phone with instructions given. Made aware to be here @ 0730, NPO after MN prior and driver post procedure/recovery. Stated understanding.

## 2021-02-13 NOTE — Telephone Encounter (Signed)
Hey katherine! Im in [place] until Wednesday, but ill be there Thursday, so I sent this to Shirlean Mylar and Ledell Noss!

## 2021-02-15 ENCOUNTER — Other Ambulatory Visit: Payer: Self-pay | Admitting: Student

## 2021-02-15 ENCOUNTER — Other Ambulatory Visit: Payer: Self-pay | Admitting: Radiology

## 2021-02-15 NOTE — H&P (Signed)
Chief Complaint: Patient was seen in consultation today for bone marrow biopsy with aspiration.   Referring Physician(s): Lequita Asal  Supervising Physician: Sandi Mariscal  Patient Status: [facility] - Out-pt  History of Present Illness: [name] is a 65 y.o. female with a medical history significant for systemic lupus erythematosis. She is followed by hematology for chronic thrombocytosis and monocytosis which is of unclear etiology. She has a persistent monocyte count greater than 1000 and all lab work up to this point has been unrevealing.   Interventional Radiology has been asked to evaluate this patient for an image-guided bone marrow biopsy with aspiration for further work up.   Past Medical History:  Diagnosis Date  . Abnormal pap   . Anemia   . Anxiety   . History of lupus 1987  . Hypertension   . Low vitamin D level   . Renal insufficiency 1988   kidney biopsy 1988 and 1998- "my kidneys function at like 48%"    Past Surgical History:  Procedure Laterality Date  . BREAST BIOPSY Right 07/08/2017  . BREAST EXCISIONAL BIOPSY Right 08/06/2017  . BREAST EXCISIONAL BIOPSY Right 07/1999  . BREAST LUMPECTOMY WITH RADIOACTIVE SEED LOCALIZATION Right 08/06/2017   Procedure: RIGHT BREAST LUMPECTOMY WITH RADIOACTIVE SEED LOCALIZATION ERAS PATHWAY;  Surgeon: Excell Seltzer, MD;  Location: [place];  Service: General;  Laterality: Right;  . BREAST LUMPECTOMY WITH RADIOACTIVE SEED LOCALIZATION Right 08/19/2018   Procedure: RIGHT BREAST LUMPECTOMY WITH RADIOACTIVE SEED LOCALIZATION ERAS PATHWAY;  Surgeon: Excell Seltzer, MD;  Location: [facility];  Service: General;  Laterality: Right;  . BREAST SURGERY  10/00   right breast benign fibroadenoma  . COLONOSCOPY  2012  . COLONOSCOPY WITH PROPOFOL N/A 05/01/2017   Procedure: COLONOSCOPY WITH PROPOFOL;  Surgeon: Christene Lye, MD;  Location: [facility] ENDOSCOPY;  Service: Endoscopy;  Laterality: N/A;   . CRYOTHERAPY     abnormal pap smear  . ESOPHAGOGASTRODUODENOSCOPY (EGD) WITH PROPOFOL N/A 05/01/2017   Procedure: ESOPHAGOGASTRODUODENOSCOPY (EGD) WITH PROPOFOL;  Surgeon: Christene Lye, MD;  Location: [facility] ENDOSCOPY;  Service: Endoscopy;  Laterality: N/A;  . NASAL SEPTUM SURGERY  2007  . TUBAL LIGATION  1990    Allergies: Elemental sulfur and Sulfa antibiotics  Medications: Prior to Admission medications   Medication Sig Start Date End Date Taking? Authorizing Provider  ALPRAZolam Duanne Moron) 0.25 MG tablet Take 0.25 mg by mouth daily as needed for anxiety.  01/14/17   [provider]  aspirin 81 MG tablet Take 81 mg by mouth daily.    [provider]  calcium carbonate (OS-CAL) 600 MG TABS tablet Take 600 mg by mouth daily with breakfast.     [provider]  Cholecalciferol (VITAMIN D3 PO) Take 1 tablet by mouth daily.     [provider]  enalapril (VASOTEC) 10 MG tablet Take 2 tablets by mouth 2 (two) times daily. 01/11/20   [provider]  fluticasone (FLONASE) 50 MCG/ACT nasal spray Place 1 spray into both nostrils as needed.  Patient not taking: Reported on 12/12/2020 07/16/19   [provider]  hydroxychloroquine (PLAQUENIL) 200 MG tablet Take 200 mg by mouth every other day.     [provider]  omeprazole (PRILOSEC) 20 MG capsule Take 20 mg by mouth daily.    [provider]  traMADol (ULTRAM) 50 MG tablet Take 50 mg by mouth every 6 (six) hours as needed for moderate pain.    [provider]  traZODone (DESYREL)  50 MG tablet Take 50 mg by mouth at bedtime as needed for sleep.  Patient not taking: Reported on 12/12/2020    [provider]     Family History  Problem Relation Age of Onset  . Cancer Father        oral cancer  . Cancer Maternal Grandmother        ovarian cancer  . Hypertension Maternal Grandmother   . Diabetes Maternal Grandfather   . Diabetes Paternal Aunt   .  Breast cancer Neg Hx     Social History   Socioeconomic History  . Marital status: Widowed    Spouse name: Not on file  . Number of children: Not on file  . Years of education: Not on file  . Highest education level: Not on file  Occupational History  . Not on file  Tobacco Use  . Smoking status: Never Smoker  . Smokeless tobacco: Never Used  Vaping Use  . Vaping Use: Never used  Substance and Sexual Activity  . Alcohol use: Yes    Alcohol/week: 4.0 - 6.0 standard drinks    Types: 4 - 6 Standard drinks or equivalent per week    Comment: few beers on friday and saturday  . Drug use: Yes    Types: Marijuana    Comment: few times a month   . Sexual activity: Yes    Partners: Male    Birth control/protection: Surgical, Post-menopausal    Comment: BTL  Other Topics Concern  . Not on file  Social History Narrative   Husband died November 22, 2016 from lung cancer at age 31 yrs old.     Social Determinants of Health   Financial Resource Strain: Not on file  Food Insecurity: Not on file  Transportation Needs: Not on file  Physical Activity: Not on file  Stress: Not on file  Social Connections: Not on file    Review of Systems: A 12 point ROS discussed and pertinent positives are indicated in the HPI above.  All other systems are negative.  Review of Systems  Constitutional: Positive for appetite change and fatigue.  Respiratory: Negative for cough and shortness of breath.   Cardiovascular: Negative for chest pain and leg swelling.  Gastrointestinal: Negative for abdominal pain, diarrhea, nausea and vomiting.  Neurological: Negative for dizziness and headaches.    Vital Signs: BP 128/63   Pulse 69   Temp 98.1 F (36.7 C) (Oral)   Resp 18   Ht _0  (1.676 m)   Wt 182 lb (82.6 kg)   LMP 08/22/2008   SpO2 99%   BMI 29.38 kg/m   Physical Exam Constitutional:      General: She is not in acute distress. HENT:     Mouth/Throat:     Mouth: Mucous membranes are moist.      Pharynx: Oropharynx is clear.  Pulmonary:     Effort: Pulmonary effort is normal.  Musculoskeletal:        General: Normal range of motion.  Neurological:     Mental Status: She is alert and oriented to person, place, and time.     Imaging: No results found.  Labs:  CBC: Recent Labs    12/08/20 1505 02/16/21 0800  WBC 11.1* 10.0  HGB 10.5* 10.5*  HCT 31.5* 31.2*  PLT 529* 535*    COAGS: No results for input(s): INR, APTT in the last 8760 hours.  BMP: Recent Labs    12/08/20 1505  NA 141  K 4.3  CL 106  CO2 27  GLUCOSE 81  BUN 23  CALCIUM 9.2  CREATININE 1.11*  GFRNONAA 55*    LIVER FUNCTION TESTS: Recent Labs    12/08/20 1505  BILITOT 0.7  AST 18  ALT 10  ALKPHOS 66  PROT 7.2  ALBUMIN 4.0    TUMOR MARKERS: No results for input(s): AFPTM, CEA, CA199, CHROMGRNA in the last 8760 hours.  Assessment and Plan:  Thrombocytosis; monocytosis of unclear etiology: [name], 65 year old female, presents today to the [facility] Interventional Radiology department for an image-guided bone marrow biopsy with aspiration.  Risks and benefits of this procedure were discussed with the patient and/or patient's family including, but not limited to bleeding, infection, damage to adjacent structures or low yield requiring additional tests.  All of the questions were answered and there is agreement to proceed.  Consent signed and in chart.  Thank you for this interesting consult.  I greatly enjoyed meeting [name] and look forward to participating in their care.  A copy of this report was sent to the requesting provider on this date.  Electronically Signed: Soyla Dryer, AGACNP-BC [phone] 02/16/2021, 8:08 AM   I spent a total of  30 Minutes   in face to face in clinical consultation, greater than 50% of which was counseling/coordinating care for bone marrow biopsy with aspiration.

## 2021-02-16 ENCOUNTER — Ambulatory Visit
Admission: RE | Admit: 2021-02-16 | Discharge: 2021-02-16 | Disposition: A | Discharge status: Home / Self Care | Payer: Medicare Other | Source: Ambulatory Visit | Attending: Hematology and Oncology | Admitting: Hematology and Oncology

## 2021-02-16 ENCOUNTER — Other Ambulatory Visit: Payer: Self-pay

## 2021-02-16 DIAGNOSIS — D6489 Other specified anemias: Secondary | ICD-10-CM | POA: Diagnosis not present

## 2021-02-16 DIAGNOSIS — D75839 Thrombocytosis, unspecified: Secondary | ICD-10-CM | POA: Insufficient documentation

## 2021-02-16 DIAGNOSIS — D72821 Monocytosis (symptomatic): Secondary | ICD-10-CM | POA: Diagnosis not present

## 2021-02-16 DIAGNOSIS — D649 Anemia, unspecified: Secondary | ICD-10-CM | POA: Insufficient documentation

## 2021-02-16 LAB — CBC WITH DIFFERENTIAL/PLATELET
Abs Immature Granulocytes: 0.05 10*3/uL (ref 0.00–0.07)
Basophils Absolute: 0.1 10*3/uL (ref 0.0–0.1)
Basophils Relative: 1 %
Eosinophils Absolute: 0.7 10*3/uL — ABNORMAL HIGH (ref 0.0–0.5)
Eosinophils Relative: 7 %
HCT: 31.2 % — ABNORMAL LOW (ref 36.0–46.0)
Hemoglobin: 10.5 g/dL — ABNORMAL LOW (ref 12.0–15.0)
Immature Granulocytes: 1 %
Lymphocytes Relative: 25 %
Lymphs Abs: 2.5 10*3/uL (ref 0.7–4.0)
MCH: 32.4 pg (ref 26.0–34.0)
MCHC: 33.7 g/dL (ref 30.0–36.0)
MCV: 96.3 fL (ref 80.0–100.0)
Monocytes Absolute: 1.5 10*3/uL — ABNORMAL HIGH (ref 0.1–1.0)
Monocytes Relative: 15 %
Neutro Abs: 5.2 10*3/uL (ref 1.7–7.7)
Neutrophils Relative %: 51 %
Platelets: 535 10*3/uL — ABNORMAL HIGH (ref 150–400)
RBC: 3.24 MIL/uL — ABNORMAL LOW (ref 3.87–5.11)
RDW: 14.2 % (ref 11.5–15.5)
WBC: 10 10*3/uL (ref 4.0–10.5)
nRBC: 0.3 % — ABNORMAL HIGH (ref 0.0–0.2)

## 2021-02-16 MED ORDER — MIDAZOLAM HCL 5 MG/5ML IJ SOLN
INTRAMUSCULAR | Status: AC
  Filled 2021-02-16: qty 5

## 2021-02-16 MED ORDER — FENTANYL CITRATE (PF) 100 MCG/2ML IJ SOLN
INTRAMUSCULAR | Status: AC | PRN
  Administered 2021-02-16: 50 ug via INTRAVENOUS

## 2021-02-16 MED ORDER — FENTANYL CITRATE (PF) 100 MCG/2ML IJ SOLN
INTRAMUSCULAR | Status: AC
  Filled 2021-02-16: qty 2

## 2021-02-16 MED ORDER — MIDAZOLAM HCL 5 MG/5ML IJ SOLN
INTRAMUSCULAR | Status: AC | PRN
  Administered 2021-02-16: 1 mg via INTRAVENOUS

## 2021-02-16 MED ORDER — SODIUM CHLORIDE 0.9 % IV SOLN: INTRAVENOUS | Status: DC

## 2021-02-16 MED ORDER — HEPARIN SOD (PORK) LOCK FLUSH 100 UNIT/ML IV SOLN
INTRAVENOUS | Status: AC
  Filled 2021-02-16: qty 5

## 2021-02-16 NOTE — Discharge Instructions (Signed)
Moderate Conscious Sedation, Adult, Care After This sheet gives you information about how to care for yourself after your procedure. Your health care provider may also give you more specific instructions. If you have problems or questions, contact your health care provider. What can I expect after the procedure? After the procedure, it is common to have:  Sleepiness for several hours.  Impaired judgment for several hours.  Difficulty with balance.  Vomiting if you eat too soon. Follow these instructions at home: For the time period you were told by your health care provider:  Rest.  Do not participate in activities where you could fall or become injured.  Do not drive or use machinery.  Do not drink alcohol.  Do not take sleeping pills or medicines that cause drowsiness.  Do not make important decisions or sign legal documents.  Do not take care of children on your own.      Eating and drinking  Follow the diet recommended by your health care provider.  Drink enough fluid to keep your urine pale yellow.  If you vomit: ? Drink water, juice, or soup when you can drink without vomiting. ? Make sure you have little or no nausea before eating solid foods.   General instructions  Take over-the-counter and prescription medicines only as told by your health care provider.  Have a responsible adult stay with you for the time you are told. It is important to have someone help care for you until you are awake and alert.  Do not smoke.  Keep all follow-up visits as told by your health care provider. This is important. Contact a health care provider if:  You are still sleepy or having trouble with balance after 24 hours.  You feel light-headed.  You keep feeling nauseous or you keep vomiting.  You develop a rash.  You have a fever.  You have redness or swelling around the IV site. Get help right away if:  You have trouble breathing.  You have new-onset confusion at  home. Summary  After the procedure, it is common to feel sleepy, have impaired judgment, or feel nauseous if you eat too soon.  Rest after you get home. Know the things you should not do after the procedure.  Follow the diet recommended by your health care provider and drink enough fluid to keep your urine pale yellow.  Get help right away if you have trouble breathing or new-onset confusion at home. This information is not intended to replace advice given to you by your health care provider. Make sure you discuss any questions you have with your health care provider. Document Revised: 02/05/2020 Document Reviewed: 09/03/2019 Elsevier Patient Education  2021 Elsevier Inc.  Needle Biopsy, Care After These instructions tell you how to care for yourself after your procedure. Your doctor may also give you more specific instructions. Call your doctor if you have any problems or questions. What can I expect after the procedure? After the procedure, it is common to have:  Soreness.  Bruising.  Mild pain. Follow these instructions at home:  Return to your normal activities as told by your doctor. Ask your doctor what activities are safe for you.  Take over-the-counter and prescription medicines only as told by your doctor.  Wash your hands with soap and water before you change your bandage (dressing). If you cannot use soap and water, use hand sanitizer.  Follow instructions from your doctor about: ? How to take care of your puncture site. ? When   and how to change your bandage. ? When to remove your bandage.  Check your puncture site every day for signs of infection. Watch for: ? Redness, swelling, or pain. ? Fluid or blood. ? Pus or a bad smell. ? Warmth.  Do not take baths, swim, or use a hot tub until your doctor approves. Ask your doctor if you may take showers. You may only be allowed to take sponge baths.  Keep all follow-up visits as told by your doctor. This is  important.   Contact a doctor if you have:  A fever.  Redness, swelling, or pain at the puncture site, and it lasts longer than a few days.  Fluid, blood, or pus coming from the puncture site.  Warmth coming from the puncture site. Get help right away if:  You have a lot of bleeding from the puncture site. Summary  After the procedure, it is common to have soreness, bruising, or mild pain at the puncture site.  Check your puncture site every day for signs of infection, such as redness, swelling, or pain.  Get help right away if you have severe bleeding from your puncture site. This information is not intended to replace advice given to you by your health care provider. Make sure you discuss any questions you have with your health care provider. Document Revised: 04/07/2020 Document Reviewed: 04/07/2020 Elsevier Patient Education  2021 Elsevier Inc.  

## 2021-02-16 NOTE — Procedures (Signed)
Pre-procedure Diagnosis: Thrombocytosis and Monocytosis  Post-procedure Diagnosis: Same  Technically successful CT guided bone marrow aspiration and biopsy of left iliac crest.   Complications: None Immediate  EBL: None  Signed: Sandi Mariscal Pager: [phone] 02/16/2021, 9:07 AM

## 2021-02-17 LAB — SURGICAL PATHOLOGY

## 2021-02-21 ENCOUNTER — Telehealth: Payer: Self-pay

## 2021-02-21 NOTE — Telephone Encounter (Signed)
Contacted pt to inform of BM results being normal, pt wanted a call back because she was concerned that her f/u appt was to far out. Called pt back to explain that had the report showed any abnormalities we would have brought her in sooner. With normal results she is able to keep her appt as scheduled. However, pts VM was full and was not able to relay the message.

## 2021-02-22 ENCOUNTER — Other Ambulatory Visit: Payer: Self-pay | Admitting: Oncology

## 2021-02-22 ENCOUNTER — Encounter (HOSPITAL_COMMUNITY): Payer: Self-pay | Admitting: Hematology and Oncology

## 2021-02-22 DIAGNOSIS — D649 Anemia, unspecified: Secondary | ICD-10-CM

## 2021-02-22 NOTE — Progress Notes (Signed)
Re: Bone Marrow Results  Reviewed with Dr. Grayland Ormond.   DIAGNOSIS:   BONE MARROW, ASPIRATE, CLOT, CORE:  -Hypercellular bone marrow for age with trilineage hematopoiesis  -See comment   PERIPHERAL BLOOD:  -Normocytic-normochromic anemia  -Monocytosis  -Thrombocytosis   COMMENT:   The bone marrow is hypercellular for age with trilineage hematopoiesis.  There are mild dyspoietic changes at best involving myeloid cell lines  with no increase in blastic cells. In the setting of systemic lupus  erythematosus and treatment, the findings are not considered specific or  diagnostic of a myeloid neoplasm and may be secondary in nature.  Nonetheless, correlation with cytogenetic and FISH studies is  recommended. There is no evidence of a lymphoproliferative disorder.   DIAGNOSIS:   - No monoclonal B-cell population or abnormal T-cell phenotype  identified.    GATING AND PHENOTYPIC ANALYSIS:   Gated population: Flow cytometric immunophenotyping is performed using  antibodies to the antigens listed in the table below. Electronic gates  are placed around a cell cluster displaying light scatter properties  corresponding to: lymphocytes   Abnormal Cells in gated population: N/A   Phenotype of Abnormal Cells: N/A   Reviewed with patient.  Recommend follow-up labs in approximately 6 to 8 weeks.  Labs ordered.   Faythe Casa, NP 02/22/2021 2:04 PM

## 2021-02-23 ENCOUNTER — Telehealth: Payer: Self-pay | Admitting: Oncology

## 2021-02-23 NOTE — Telephone Encounter (Signed)
02/23/2021 Left VM informing pt of 6 week lab f/u per Sonia Baller on 6/16. Gave callback number and encouraged her to reach out to [place] if there were any questions or scheduling conflicts  SRW

## 2021-02-27 ENCOUNTER — Encounter (HOSPITAL_COMMUNITY): Payer: Self-pay | Admitting: Hematology and Oncology

## 2021-03-07 DIAGNOSIS — H524 Presbyopia: Secondary | ICD-10-CM | POA: Diagnosis not present

## 2021-03-07 DIAGNOSIS — H52223 Regular astigmatism, bilateral: Secondary | ICD-10-CM | POA: Diagnosis not present

## 2021-03-07 DIAGNOSIS — M3210 Systemic lupus erythematosus, organ or system involvement unspecified: Secondary | ICD-10-CM | POA: Diagnosis not present

## 2021-03-07 DIAGNOSIS — Z79899 Other long term (current) drug therapy: Secondary | ICD-10-CM | POA: Diagnosis not present

## 2021-03-07 DIAGNOSIS — H5213 Myopia, bilateral: Secondary | ICD-10-CM | POA: Diagnosis not present

## 2021-03-07 DIAGNOSIS — H2513 Age-related nuclear cataract, bilateral: Secondary | ICD-10-CM | POA: Diagnosis not present

## 2021-03-07 DIAGNOSIS — Z8669 Personal history of other diseases of the nervous system and sense organs: Secondary | ICD-10-CM | POA: Diagnosis not present

## 2021-03-07 DIAGNOSIS — H43812 Vitreous degeneration, left eye: Secondary | ICD-10-CM | POA: Diagnosis not present

## 2021-03-16 DIAGNOSIS — M898X7 Other specified disorders of bone, ankle and foot: Secondary | ICD-10-CM | POA: Diagnosis not present

## 2021-03-16 DIAGNOSIS — D2372 Other benign neoplasm of skin of left lower limb, including hip: Secondary | ICD-10-CM | POA: Diagnosis not present

## 2021-03-16 DIAGNOSIS — M2042 Other hammer toe(s) (acquired), left foot: Secondary | ICD-10-CM | POA: Diagnosis not present

## 2021-03-16 DIAGNOSIS — M79672 Pain in left foot: Secondary | ICD-10-CM | POA: Diagnosis not present

## 2021-03-16 DIAGNOSIS — M7742 Metatarsalgia, left foot: Secondary | ICD-10-CM | POA: Diagnosis not present

## 2021-03-22 LAB — SURGICAL PATHOLOGY

## 2021-03-22 NOTE — Progress Notes (Signed)
65 y.o. G36P1001 Widowed Caucasian female here for breast and pelvic exam.   Would like to discuss lab results. Labs in [place].  She is seeing a hematologist and is followed for elevated platelets.  She is seeking understanding for the indication for her testing and results   Has an appointment in July or August in [place] to see new oncology provider.   Notices decreased energy.  Starting to work out more.  Wants to loose weight.   Takes OTC omeprazole.  Has diverticulosis.   Has vaginal bleeding sometimes with intercourse.  No spontaneous bleeding.   Has some urgency to void.  Drinks sodas. May wear a panty liner for urinary incontinence when she rides a motor cycle.  PCP:   Merrie Roof, PA-C    Patient's last menstrual period was 08/22/2008.           Sexually active: Yes. The current method of family planning is post menopausal status. Exercising: Yes,   treadmill, cycling and gym Smoker:  No  Health Maintenance: Pap:  04/04/2020 Neg, 03-25-17 Neg:Neg HR HPV, 04-01-18 Neg History of abnormal Pap: Yes. Hx of cryotherapy years ago. MMG:  07/08/2020 Incomplete. Need additional imaging. 07/22/2020  Diagnostic unilateral Left mammogram- See report in Epic Colonoscopy:  2018 normal  BMD:   07-06-19  Result :Normal TDaP: 2019 HIV: 2017 NR Hep C: 2017 Neg Screening Labs:  PCP    reports that she has never smoked. She has never used smokeless tobacco. She reports current alcohol use of about 4.0 - 6.0 standard drinks of alcohol per week. She reports current drug use. Drug: Marijuana.  Past Medical History:  Diagnosis Date   Abnormal pap    Anemia    Anxiety    History of lupus 1987   Hypertension    Low vitamin D level    Renal insufficiency 1988   kidney biopsy 1988 and 1998- "my kidneys function at like 48%"    Past Surgical History:  Procedure Laterality Date   BONE MARROW BIOPSY  01/2021   BREAST BIOPSY Right 07/08/2017   BREAST EXCISIONAL BIOPSY Right 08/06/2017    BREAST EXCISIONAL BIOPSY Right 07/1999   BREAST LUMPECTOMY WITH RADIOACTIVE SEED LOCALIZATION Right 08/06/2017   Procedure: RIGHT BREAST LUMPECTOMY WITH RADIOACTIVE SEED LOCALIZATION ERAS PATHWAY;  Surgeon: Excell Seltzer, MD;  Location: [facility];  Service: General;  Laterality: Right;   BREAST LUMPECTOMY WITH RADIOACTIVE SEED LOCALIZATION Right 08/19/2018   Procedure: RIGHT BREAST LUMPECTOMY [place];  Surgeon: Excell Seltzer, MD;  Location: [facility];  Service: General;  Laterality: Right;   BREAST SURGERY  07/1999   right breast benign fibroadenoma   COLONOSCOPY  2012   COLONOSCOPY WITH PROPOFOL N/A 05/01/2017   Procedure: COLONOSCOPY WITH PROPOFOL;  Surgeon: Christene Lye, MD;  Location: [facility];  Service: Endoscopy;  Laterality: N/A;   CRYOTHERAPY     abnormal pap smear   ESOPHAGOGASTRODUODENOSCOPY (EGD) WITH PROPOFOL N/A 05/01/2017   Procedure: ESOPHAGOGASTRODUODENOSCOPY (EGD) WITH PROPOFOL;  Surgeon: Christene Lye, MD;  Location: [facility];  Service: Endoscopy;  Laterality: N/A;   NASAL SEPTUM SURGERY  2007   TUBAL LIGATION  1990    Current Outpatient Medications  Medication Sig Dispense Refill   ALPRAZolam (XANAX) 0.25 MG tablet Take 0.25 mg by mouth daily as needed for anxiety.      aspirin 81 MG tablet Take 81 mg by mouth daily.     chlorthalidone (HYGROTON) 25 MG tablet Take 12.5  mg by mouth in the morning.     Cholecalciferol (VITAMIN D3 PO) Take 1 tablet by mouth daily.      enalapril (VASOTEC) 10 MG tablet Take 2 tablets by mouth 2 (two) times daily.     fluticasone (FLONASE) 50 MCG/ACT nasal spray Place 1 spray into both nostrils as needed.     hydroxychloroquine (PLAQUENIL) 200 MG tablet Take 200 mg by mouth every other day.      omeprazole (PRILOSEC) 20 MG capsule Take 20 mg by mouth daily.     traMADol (ULTRAM) 50 MG tablet Take 50 mg by mouth every 6 (six) hours as needed for  moderate pain.     traZODone (DESYREL) 50 MG tablet Take 50 mg by mouth at bedtime as needed for sleep.     No current facility-administered medications for this visit.    Family History  Problem Relation Age of Onset   Cancer Father        oral cancer   Cancer Maternal Grandmother        ovarian cancer   Hypertension Maternal Grandmother    Diabetes Maternal Grandfather    Diabetes Paternal Aunt    Breast cancer Neg Hx     Review of Systems  Constitutional: Negative.   HENT: Negative.    Eyes: Negative.   Respiratory: Negative.    Cardiovascular: Negative.   Gastrointestinal: Negative.   Endocrine: Negative.   Genitourinary: Negative.   Musculoskeletal: Negative.   Skin: Negative.   Allergic/Immunologic: Negative.   Neurological: Negative.   Hematological: Negative.   Psychiatric/Behavioral: Negative.     Exam:   BP 122/80   Pulse 82   Ht '5\' 5"'  (1.651 m)   Wt 183 lb (83 kg)   LMP 08/22/2008   SpO2 98%   BMI 30.45 kg/m     General appearance: alert, cooperative and appears stated age Head: normocephalic, without obvious abnormality, atraumatic Neck: no adenopathy, supple, symmetrical, trachea midline and thyroid normal to inspection and palpation Lungs: clear to auscultation bilaterally Breasts: normal appearance, no masses or tenderness, No nipple retraction or dimpling, No nipple discharge or bleeding, No axillary adenopathy Heart: regular rate and rhythm Abdomen: soft, non-tender; no masses, no organomegaly Extremities: extremities normal, atraumatic, no cyanosis or edema Skin: skin color, texture, turgor normal. Multiple pigmented nevi of skin including breasts. Lymph nodes: cervical, supraclavicular, and axillary nodes normal. Neurologic: grossly normal  Pelvic: External genitalia:  no lesions              No abnormal inguinal nodes palpated.              Urethra:  normal appearing urethra with no masses, tenderness or lesions              Bartholins and  Skenes: normal                 Vagina: normal appearing vagina with normal color and discharge, no lesions              Cervix: no lesions              Pap taken: No. Bimanual Exam:  Uterus:  normal size, contour, position, consistency, mobility, non-tender              Adnexa: no mass, fullness, tenderness              Rectal exam: Yes.  .  Confirms.  Anus:  normal sphincter tone, no lesions  Chaperone was present for exam.  Assessment:   Screening breast exam.  Hx breast biopsies.  Benign. Pelvic exam without abnormal finding.  Vaginal atrophy. Remote hx of cryotherapy to cervix.  Encounter for rectal exam.  Chronic renal disease.  Lupus. Weight gain.  Fatigue.  Pigmented nevi.  Abnormal CBCs.   Plan: Mammogram screening discussed. Self breast awareness reviewed. Pap 2023.  Guidelines for Calcium, Vitamin D, regular exercise program including cardiovascular and weight bearing exercise. Referral to nutrition specialist.  We discussed H20 based lubricants and cooking oils.  Also mentioned vaginal estrogens along with potential effect on breast cancer. Sees dermatology regularly. Oncology labs reviewed.  She will schedule an appointment with her PCP to do routine labs that have not been done with oncology.  I did encourage her to bring someone with her to her oncology appointment in July.  Follow up annually and prn.   48 min total time was spent for this patient encounter, including preparation, face-to-face counseling with the patient, coordination of care, and documentation of the encounter.

## 2021-03-30 DIAGNOSIS — M79672 Pain in left foot: Secondary | ICD-10-CM | POA: Diagnosis not present

## 2021-03-30 DIAGNOSIS — D2372 Other benign neoplasm of skin of left lower limb, including hip: Secondary | ICD-10-CM | POA: Diagnosis not present

## 2021-04-05 ENCOUNTER — Encounter: Payer: Self-pay | Admitting: Obstetrics and Gynecology

## 2021-04-05 ENCOUNTER — Ambulatory Visit (INDEPENDENT_AMBULATORY_CARE_PROVIDER_SITE_OTHER): Payer: Medicare Other | Admitting: Obstetrics and Gynecology

## 2021-04-05 ENCOUNTER — Other Ambulatory Visit: Payer: Self-pay

## 2021-04-05 ENCOUNTER — Other Ambulatory Visit: Payer: Self-pay | Admitting: Obstetrics and Gynecology

## 2021-04-05 VITALS — BP 122/80 | HR 82 | Ht 65.0 in | Wt 183.0 lb

## 2021-04-05 DIAGNOSIS — R899 Unspecified abnormal finding in specimens from other organs, systems and tissues: Secondary | ICD-10-CM

## 2021-04-05 DIAGNOSIS — Z008 Encounter for other general examination: Secondary | ICD-10-CM | POA: Diagnosis not present

## 2021-04-05 DIAGNOSIS — Z1231 Encounter for screening mammogram for malignant neoplasm of breast: Secondary | ICD-10-CM

## 2021-04-05 DIAGNOSIS — Z1239 Encounter for other screening for malignant neoplasm of breast: Secondary | ICD-10-CM

## 2021-04-05 DIAGNOSIS — Z01419 Encounter for gynecological examination (general) (routine) without abnormal findings: Secondary | ICD-10-CM | POA: Diagnosis not present

## 2021-04-05 DIAGNOSIS — R5383 Other fatigue: Secondary | ICD-10-CM | POA: Diagnosis not present

## 2021-04-05 DIAGNOSIS — R635 Abnormal weight gain: Secondary | ICD-10-CM | POA: Diagnosis not present

## 2021-04-05 DIAGNOSIS — D229 Melanocytic nevi, unspecified: Secondary | ICD-10-CM | POA: Diagnosis not present

## 2021-04-05 NOTE — Patient Instructions (Signed)

## 2021-04-06 ENCOUNTER — Other Ambulatory Visit: Payer: Self-pay

## 2021-04-06 ENCOUNTER — Other Ambulatory Visit: Payer: Medicare Other

## 2021-04-06 ENCOUNTER — Inpatient Hospital Stay: Payer: Medicare Other | Attending: Internal Medicine

## 2021-04-06 DIAGNOSIS — D72821 Monocytosis (symptomatic): Secondary | ICD-10-CM | POA: Insufficient documentation

## 2021-04-06 DIAGNOSIS — D75839 Thrombocytosis, unspecified: Secondary | ICD-10-CM | POA: Insufficient documentation

## 2021-04-06 DIAGNOSIS — D649 Anemia, unspecified: Secondary | ICD-10-CM

## 2021-04-06 LAB — COMPREHENSIVE METABOLIC PANEL
ALT: 13 U/L (ref 0–44)
AST: 23 U/L (ref 15–41)
Albumin: 4.3 g/dL (ref 3.5–5.0)
Alkaline Phosphatase: 75 U/L (ref 38–126)
Anion gap: 8 (ref 5–15)
BUN: 29 mg/dL — ABNORMAL HIGH (ref 8–23)
CO2: 25 mmol/L (ref 22–32)
Calcium: 9.4 mg/dL (ref 8.9–10.3)
Chloride: 103 mmol/L (ref 98–111)
Creatinine, Ser: 1.28 mg/dL — ABNORMAL HIGH (ref 0.44–1.00)
GFR, Estimated: 46 mL/min — ABNORMAL LOW (ref >60)
Glucose, Bld: 94 mg/dL (ref 70–99)
Potassium: 4.8 mmol/L (ref 3.5–5.1)
Sodium: 136 mmol/L (ref 135–145)
Total Bilirubin: 0.7 mg/dL (ref 0.3–1.2)
Total Protein: 7.6 g/dL (ref 6.5–8.1)

## 2021-04-06 LAB — IRON AND TIBC
Iron: 139 ug/dL (ref 28–170)
Saturation Ratios: 35 % — ABNORMAL HIGH (ref 10.4–31.8)
TIBC: 395 ug/dL (ref 250–450)
UIBC: 256 ug/dL

## 2021-04-06 LAB — CBC WITH DIFFERENTIAL/PLATELET
Abs Immature Granulocytes: 0.02 10*3/uL (ref 0.00–0.07)
Basophils Absolute: 0.1 10*3/uL (ref 0.0–0.1)
Basophils Relative: 1 %
Eosinophils Absolute: 0.2 10*3/uL (ref 0.0–0.5)
Eosinophils Relative: 2 %
HCT: 32.3 % — ABNORMAL LOW (ref 36.0–46.0)
Hemoglobin: 11.2 g/dL — ABNORMAL LOW (ref 12.0–15.0)
Immature Granulocytes: 0 %
Lymphocytes Relative: 29 %
Lymphs Abs: 2.6 10*3/uL (ref 0.7–4.0)
MCH: 32.9 pg (ref 26.0–34.0)
MCHC: 34.7 g/dL (ref 30.0–36.0)
MCV: 95 fL (ref 80.0–100.0)
Monocytes Absolute: 1.1 10*3/uL — ABNORMAL HIGH (ref 0.1–1.0)
Monocytes Relative: 12 %
Neutro Abs: 5.1 10*3/uL (ref 1.7–7.7)
Neutrophils Relative %: 56 %
Platelets: 519 10*3/uL — ABNORMAL HIGH (ref 150–400)
RBC: 3.4 MIL/uL — ABNORMAL LOW (ref 3.87–5.11)
RDW: 13.6 % (ref 11.5–15.5)
WBC: 9.1 10*3/uL (ref 4.0–10.5)
nRBC: 0 % (ref 0.0–0.2)

## 2021-04-06 LAB — FERRITIN: Ferritin: 39 ng/mL (ref 11–307)

## 2021-04-06 LAB — FOLATE: Folate: 27 ng/mL (ref >5.9)

## 2021-04-06 LAB — VITAMIN B12: Vitamin B-12: 126 pg/mL — ABNORMAL LOW (ref 180–914)

## 2021-04-25 ENCOUNTER — Other Ambulatory Visit: Payer: Self-pay | Admitting: Obstetrics and Gynecology

## 2021-04-25 ENCOUNTER — Telehealth: Payer: Self-pay | Admitting: Internal Medicine

## 2021-04-25 DIAGNOSIS — N189 Chronic kidney disease, unspecified: Secondary | ICD-10-CM

## 2021-04-25 DIAGNOSIS — R635 Abnormal weight gain: Secondary | ICD-10-CM

## 2021-04-25 NOTE — Telephone Encounter (Signed)
Patient would like to confirm that her labs from 6/16 were reviewed. She has requested that someone from the clinical team please call her to discuss the results.

## 2021-04-25 NOTE — Progress Notes (Signed)
Referral to nutrition specialist for chronic renal disease and weight gain.

## 2021-04-26 NOTE — Telephone Encounter (Signed)
[name] - please advise and review labs

## 2021-04-27 ENCOUNTER — Other Ambulatory Visit: Payer: Self-pay | Admitting: *Deleted

## 2021-04-27 DIAGNOSIS — D519 Vitamin B12 deficiency anemia, unspecified: Secondary | ICD-10-CM | POA: Insufficient documentation

## 2021-04-27 DIAGNOSIS — D75839 Thrombocytosis, unspecified: Secondary | ICD-10-CM

## 2021-04-27 DIAGNOSIS — D509 Iron deficiency anemia, unspecified: Secondary | ICD-10-CM

## 2021-04-27 DIAGNOSIS — D649 Anemia, unspecified: Secondary | ICD-10-CM

## 2021-04-27 DIAGNOSIS — D72821 Monocytosis (symptomatic): Secondary | ICD-10-CM

## 2021-04-27 NOTE — Telephone Encounter (Signed)
RE: Labs  Would you mind giving her a call and letting her know that her B12 level is low.  She can either come in for weekly B12 injections x2 followed by monthly x6 or start oral B12 supplements at home.  Her ferritin is on the low end of normal so if she is able to tolerate would recommend ferrous sulfate 1-2 tabs daily.  All of the rest of her lab work looks good.  She is scheduled to see Dr. Rogue Bussing in about a month or so.  Faythe Casa, NP 04/27/2021 11:20 AM

## 2021-04-27 NOTE — Telephone Encounter (Signed)
Spoke with patient [phone] on 04/27/21  Patient agreeble to weekly b12 injection x 2 and then monthly. Pt does not desire to be taught to self admin the injections as this time. Orders entered for the b12 injection under the care of Rulon Abide, NP. No PA needed per [place]. Pt aware.  Pt instructed to take ferrous sulfate 1 to 2 tablets (OTC) daily.  Patient prefers that her future apts be in [place]. New apts made for Dr. Rogue Bussing on 8/22

## 2021-05-01 ENCOUNTER — Other Ambulatory Visit: Payer: Self-pay

## 2021-05-01 ENCOUNTER — Inpatient Hospital Stay: Payer: Medicare Other | Attending: Internal Medicine

## 2021-05-01 DIAGNOSIS — D519 Vitamin B12 deficiency anemia, unspecified: Secondary | ICD-10-CM

## 2021-05-01 DIAGNOSIS — E538 Deficiency of other specified B group vitamins: Secondary | ICD-10-CM | POA: Insufficient documentation

## 2021-05-01 MED ORDER — CYANOCOBALAMIN 1000 MCG/ML IJ SOLN
1000.0000 ug | Freq: Once | INTRAMUSCULAR | Status: AC
  Administered 2021-05-01: 1000 ug via INTRAMUSCULAR
  Filled 2021-05-01: qty 1

## 2021-05-03 ENCOUNTER — Telehealth: Payer: Self-pay | Admitting: *Deleted

## 2021-05-03 NOTE — Telephone Encounter (Addendum)
On 7/12 at 1340-Patient called and requested that Nira Conn, RN return her phone call. Msg retrieved on 05/03/21. She stated on clinical vm that she wanted to know if she needed to take oral b12.  Call attempted at [phone], but this vm was full and was unable to leave a vm. I also attempted to reach her at home at [phone]. "Call can not be completed at this time.Marland KitchenMarland Kitchen"

## 2021-05-04 ENCOUNTER — Encounter: Payer: Self-pay | Admitting: Internal Medicine

## 2021-05-04 NOTE — Telephone Encounter (Signed)
1354- call attempt to patient. Left vm for patient's cell phone that call was being returned.

## 2021-05-08 ENCOUNTER — Inpatient Hospital Stay: Payer: Medicare Other

## 2021-05-08 ENCOUNTER — Other Ambulatory Visit: Payer: Self-pay

## 2021-05-08 DIAGNOSIS — D519 Vitamin B12 deficiency anemia, unspecified: Secondary | ICD-10-CM

## 2021-05-08 DIAGNOSIS — E538 Deficiency of other specified B group vitamins: Secondary | ICD-10-CM | POA: Diagnosis not present

## 2021-05-08 MED ORDER — CYANOCOBALAMIN 1000 MCG/ML IJ SOLN
1000.0000 ug | Freq: Once | INTRAMUSCULAR | Status: AC
  Administered 2021-05-08: 1000 ug via INTRAMUSCULAR
  Filled 2021-05-08: qty 1

## 2021-05-17 DIAGNOSIS — Z20822 Contact with and (suspected) exposure to covid-19: Secondary | ICD-10-CM | POA: Diagnosis not present

## 2021-05-30 ENCOUNTER — Other Ambulatory Visit: Payer: Self-pay

## 2021-05-30 DIAGNOSIS — K824 Cholesterolosis of gallbladder: Secondary | ICD-10-CM

## 2021-06-05 DIAGNOSIS — R799 Abnormal finding of blood chemistry, unspecified: Secondary | ICD-10-CM | POA: Diagnosis not present

## 2021-06-05 DIAGNOSIS — N1832 Chronic kidney disease, stage 3b: Secondary | ICD-10-CM | POA: Diagnosis not present

## 2021-06-05 DIAGNOSIS — M3214 Glomerular disease in systemic lupus erythematosus: Secondary | ICD-10-CM | POA: Diagnosis not present

## 2021-06-06 DIAGNOSIS — N1832 Chronic kidney disease, stage 3b: Secondary | ICD-10-CM | POA: Diagnosis not present

## 2021-06-06 DIAGNOSIS — R799 Abnormal finding of blood chemistry, unspecified: Secondary | ICD-10-CM | POA: Diagnosis not present

## 2021-06-06 DIAGNOSIS — M3214 Glomerular disease in systemic lupus erythematosus: Secondary | ICD-10-CM | POA: Diagnosis not present

## 2021-06-06 DIAGNOSIS — I10 Essential (primary) hypertension: Secondary | ICD-10-CM | POA: Diagnosis not present

## 2021-06-12 ENCOUNTER — Inpatient Hospital Stay: Payer: Medicare Other

## 2021-06-12 ENCOUNTER — Inpatient Hospital Stay (HOSPITAL_BASED_OUTPATIENT_CLINIC_OR_DEPARTMENT_OTHER): Payer: Medicare Other | Admitting: Internal Medicine

## 2021-06-12 ENCOUNTER — Inpatient Hospital Stay: Payer: Medicare Other | Attending: Internal Medicine

## 2021-06-12 ENCOUNTER — Other Ambulatory Visit: Payer: Self-pay

## 2021-06-12 DIAGNOSIS — D72821 Monocytosis (symptomatic): Secondary | ICD-10-CM

## 2021-06-12 DIAGNOSIS — D519 Vitamin B12 deficiency anemia, unspecified: Secondary | ICD-10-CM | POA: Diagnosis not present

## 2021-06-12 DIAGNOSIS — E538 Deficiency of other specified B group vitamins: Secondary | ICD-10-CM | POA: Diagnosis not present

## 2021-06-12 DIAGNOSIS — Z79899 Other long term (current) drug therapy: Secondary | ICD-10-CM | POA: Insufficient documentation

## 2021-06-12 DIAGNOSIS — N183 Chronic kidney disease, stage 3 unspecified: Secondary | ICD-10-CM | POA: Insufficient documentation

## 2021-06-12 DIAGNOSIS — I129 Hypertensive chronic kidney disease with stage 1 through stage 4 chronic kidney disease, or unspecified chronic kidney disease: Secondary | ICD-10-CM | POA: Diagnosis not present

## 2021-06-12 DIAGNOSIS — D509 Iron deficiency anemia, unspecified: Secondary | ICD-10-CM

## 2021-06-12 DIAGNOSIS — Z7982 Long term (current) use of aspirin: Secondary | ICD-10-CM | POA: Insufficient documentation

## 2021-06-12 DIAGNOSIS — D75839 Thrombocytosis, unspecified: Secondary | ICD-10-CM

## 2021-06-12 DIAGNOSIS — D631 Anemia in chronic kidney disease: Secondary | ICD-10-CM | POA: Insufficient documentation

## 2021-06-12 LAB — CBC WITH DIFFERENTIAL/PLATELET
Abs Immature Granulocytes: 0.07 10*3/uL (ref 0.00–0.07)
Basophils Absolute: 0.1 10*3/uL (ref 0.0–0.1)
Basophils Relative: 1 %
Eosinophils Absolute: 0.5 10*3/uL (ref 0.0–0.5)
Eosinophils Relative: 6 %
HCT: 31.8 % — ABNORMAL LOW (ref 36.0–46.0)
Hemoglobin: 10.7 g/dL — ABNORMAL LOW (ref 12.0–15.0)
Immature Granulocytes: 1 %
Lymphocytes Relative: 23 %
Lymphs Abs: 1.9 10*3/uL (ref 0.7–4.0)
MCH: 32.9 pg (ref 26.0–34.0)
MCHC: 33.6 g/dL (ref 30.0–36.0)
MCV: 97.8 fL (ref 80.0–100.0)
Monocytes Absolute: 1.2 10*3/uL — ABNORMAL HIGH (ref 0.1–1.0)
Monocytes Relative: 15 %
Neutro Abs: 4.3 10*3/uL (ref 1.7–7.7)
Neutrophils Relative %: 54 %
Platelets: 493 10*3/uL — ABNORMAL HIGH (ref 150–400)
RBC: 3.25 MIL/uL — ABNORMAL LOW (ref 3.87–5.11)
RDW: 13.5 % (ref 11.5–15.5)
WBC: 7.9 10*3/uL (ref 4.0–10.5)
nRBC: 0 % (ref 0.0–0.2)

## 2021-06-12 LAB — COMPREHENSIVE METABOLIC PANEL
ALT: 10 U/L (ref 0–44)
AST: 19 U/L (ref 15–41)
Albumin: 4 g/dL (ref 3.5–5.0)
Alkaline Phosphatase: 71 U/L (ref 38–126)
Anion gap: 6 (ref 5–15)
BUN: 29 mg/dL — ABNORMAL HIGH (ref 8–23)
CO2: 27 mmol/L (ref 22–32)
Calcium: 9 mg/dL (ref 8.9–10.3)
Chloride: 101 mmol/L (ref 98–111)
Creatinine, Ser: 1.46 mg/dL — ABNORMAL HIGH (ref 0.44–1.00)
GFR, Estimated: 39 mL/min — ABNORMAL LOW (ref >60)
Glucose, Bld: 82 mg/dL (ref 70–99)
Potassium: 5.3 mmol/L — ABNORMAL HIGH (ref 3.5–5.1)
Sodium: 134 mmol/L — ABNORMAL LOW (ref 135–145)
Total Bilirubin: 0.7 mg/dL (ref 0.3–1.2)
Total Protein: 7.3 g/dL (ref 6.5–8.1)

## 2021-06-12 LAB — VITAMIN B12: Vitamin B-12: 442 pg/mL (ref 180–914)

## 2021-06-12 LAB — LACTATE DEHYDROGENASE: LDH: 168 U/L (ref 98–192)

## 2021-06-12 LAB — IRON AND TIBC
Iron: 81 ug/dL (ref 28–170)
Saturation Ratios: 22 % (ref 10.4–31.8)
TIBC: 370 ug/dL (ref 250–450)
UIBC: 289 ug/dL

## 2021-06-12 LAB — URIC ACID: Uric Acid, Serum: 8.1 mg/dL — ABNORMAL HIGH (ref 2.5–7.1)

## 2021-06-12 LAB — FERRITIN: Ferritin: 39 ng/mL (ref 11–307)

## 2021-06-12 MED ORDER — CYANOCOBALAMIN 1000 MCG/ML IJ SOLN
1000.0000 ug | Freq: Once | INTRAMUSCULAR | Status: AC
Start: 2021-06-12 — End: 2021-06-12
  Administered 2021-06-12: 1000 ug via INTRAMUSCULAR
  Filled 2021-06-12: qty 1

## 2021-06-12 NOTE — Assessment & Plan Note (Addendum)
#  Anemia/CKD-III [Dr.Murphy] August 2022-hemoglobin 10.4 iron saturation 22% ferritin-39-patient is symptomatic from fatigue. I had a long discussion with the patient regarding various forms of dietary iron as it is present universally in meat.  Also discussed regarding plant sources-including but not limited to leafy vegetables [spinach], broccoli etc.  Discussed regarding oral iron supplementation-including iron sulfate forms [65 mg of elemental iron; inexpensive-however gastric discomfort constipation] versus ironbiglycinate [20 mg elemental iron; slightly more expensive; less gastric discomfort/constipation].   If patient is intolerant to oral iron/or iron levels do not improve on oral supplementation- recommend IV iron infusions.   # #B12 deficiency August 2022 B12 level 460; proceed with B12 injectionout before injection to 3 months.  Continue B12 oral at home  #  chronicthrombocytosis and monocytosis [since 2012].  myeloproliferative disorder such as CMML without current need for treatment. She has a mild normocytic anemia. April 2022-bone marrow biopsy-no acute process.  # CKD-III [Dr.Murphy, nephrology]-lupus versus others defer to nephrology.  Overall stable.  significant abnormalities.   # DISPOSITION: # B12 injection today # follow up in 1st week of dec- MD; 2-3 days prior-labs-cbc/bmp/ B12 levels.  Possible B12 injection-Dr.B

## 2021-06-12 NOTE — Progress Notes (Signed)
[facility] NOTE  Patient Care Team: Jon Billings, NP as PCP - General Kem Boroughs, FNP as Nurse Practitioner (Family Medicine) Christene Lye, MD (General Surgery) Emmaline Kluver., MD (Rheumatology) Yisroel Ramming, Everardo All, MD as Consulting Physician (Obstetrics and Gynecology)  CHIEF COMPLAINTS/PURPOSE OF CONSULTATION: Anemia  #chronic  Anemia/CKD-III-IDA  # #B12 deficiency  #  chronic thrombocytosis and monocytosis [since 2012].   myeloproliferative disorder such as CMML without current need for treatment.  She has a mild normocytic anemia. April 2022-bone marrow biopsy-no acute process- likely reactive MPN- work up NEGATIVE.  # CKD-III [Dr.Murphy, nephrology]-lupus versus others- [Dr.Murphy]    Oncology History   No history exists.   HISTORY OF PRESENTING ILLNESS:  [name] 65 y.o.  female with a history of chronic anemia/chronic mild monocytosis/thrombocytosis is here to review the results of the bone marrow biopsy/ultimately for injection.  Patient complains of chronic fatigue.  Denies any blood in stools or black or stools. no Nausea vomiting.   Review of Systems  Constitutional:  Positive for malaise/fatigue. Negative for chills, diaphoresis and fever.  HENT:  Negative for nosebleeds and sore throat.   Eyes:  Negative for double vision.  Respiratory:  Negative for cough, hemoptysis, sputum production, shortness of breath and wheezing.   Cardiovascular:  Negative for chest pain, palpitations, orthopnea and leg swelling.  Gastrointestinal:  Negative for abdominal pain, blood in stool, constipation, diarrhea, heartburn, melena, nausea and vomiting.  Genitourinary:  Negative for dysuria, frequency and urgency.  Musculoskeletal:  Positive for back pain and joint pain.  Skin: Negative.  Negative for itching and rash.  Neurological:  Negative for dizziness, tingling, focal weakness, weakness and headaches.   Endo/Heme/Allergies:  Does not bruise/bleed easily.  Psychiatric/Behavioral:  Negative for depression. The patient is not nervous/anxious and does not have insomnia.     MEDICAL HISTORY:  Past Medical History:  Diagnosis Date   Abnormal pap    Anemia    Anxiety    History of lupus 1987   Hypertension    Low vitamin D level    Renal insufficiency 1988   kidney biopsy 1988 and 1998- "my kidneys function at like 48%"    SURGICAL HISTORY: Past Surgical History:  Procedure Laterality Date   BONE MARROW BIOPSY  01/2021   BREAST BIOPSY Right 07/08/2017   BREAST EXCISIONAL BIOPSY Right 08/06/2017   BREAST EXCISIONAL BIOPSY Right 07/1999   BREAST LUMPECTOMY WITH RADIOACTIVE SEED LOCALIZATION Right 08/06/2017   Procedure: RIGHT BREAST LUMPECTOMY WITH RADIOACTIVE SEED LOCALIZATION ERAS PATHWAY;  Surgeon: Excell Seltzer, MD;  Location: [facility];  Service: General;  Laterality: Right;   BREAST LUMPECTOMY WITH RADIOACTIVE SEED LOCALIZATION Right 08/19/2018   Procedure: RIGHT BREAST LUMPECTOMY [facility];  Surgeon: Excell Seltzer, MD;  Location: [facility];  Service: General;  Laterality: Right;   BREAST SURGERY  07/1999   right breast benign fibroadenoma   COLONOSCOPY  2012   COLONOSCOPY WITH PROPOFOL N/A 05/01/2017   Procedure: COLONOSCOPY WITH PROPOFOL;  Surgeon: Christene Lye, MD;  Location: [facility];  Service: Endoscopy;  Laterality: N/A;   CRYOTHERAPY     abnormal pap smear   ESOPHAGOGASTRODUODENOSCOPY (EGD) WITH PROPOFOL N/A 05/01/2017   Procedure: ESOPHAGOGASTRODUODENOSCOPY (EGD) WITH PROPOFOL;  Surgeon: Christene Lye, MD;  Location: [facility];  Service: Endoscopy;  Laterality: N/A;   NASAL SEPTUM SURGERY  2007   TUBAL LIGATION  1990    SOCIAL HISTORY: Social History  Socioeconomic History   Marital status: Widowed    Spouse name: Not on file   Number of children: Not on file   Years of  education: Not on file   Highest education level: Not on file  Occupational History   Not on file  Tobacco Use   Smoking status: Never   Smokeless tobacco: Never  Vaping Use   Vaping Use: Never used  Substance and Sexual Activity   Alcohol use: Yes    Alcohol/week: 4.0 - 6.0 standard drinks    Types: 4 - 6 Standard drinks or equivalent per week    Comment: few beers on friday and saturday   Drug use: Yes    Types: Marijuana    Comment: few times a month    Sexual activity: Yes    Partners: Male    Birth control/protection: Surgical, Post-menopausal    Comment: BTL  Other Topics Concern   Not on file  Social History Narrative   Husband died 12/16/16 from lung cancer at age 28 yrs old.     Social Determinants of Health   Financial Resource Strain: Not on file  Food Insecurity: Not on file  Transportation Needs: Not on file  Physical Activity: Not on file  Stress: Not on file  Social Connections: Not on file  Intimate Partner Violence: Not on file    FAMILY HISTORY: Family History  Problem Relation Age of Onset   Cancer Father        oral cancer   Cancer Maternal Grandmother        ovarian cancer   Hypertension Maternal Grandmother    Diabetes Maternal Grandfather    Diabetes Paternal Aunt    Breast cancer Neg Hx     ALLERGIES:  is allergic to elemental sulfur and sulfa antibiotics.  MEDICATIONS:  Current Outpatient Medications  Medication Sig Dispense Refill   ALPRAZolam (XANAX) 0.25 MG tablet Take 0.25 mg by mouth daily as needed for anxiety.      aspirin 81 MG tablet Take 81 mg by mouth daily.     chlorthalidone (HYGROTON) 25 MG tablet Take 12.5 mg by mouth in the morning.     Cholecalciferol (VITAMIN D3 PO) Take 1 tablet by mouth daily.      cyanocobalamin 1000 MCG tablet Take 1,000 mcg by mouth daily.     enalapril (VASOTEC) 20 MG tablet Take 20 mg by mouth 2 (two) times daily.     fluticasone (FLONASE) 50 MCG/ACT nasal spray Place 1 spray into both  nostrils as needed.     hydroxychloroquine (PLAQUENIL) 200 MG tablet Take 200 mg by mouth every other day.      omeprazole (PRILOSEC) 20 MG capsule Take 20 mg by mouth daily.     traZODone (DESYREL) 50 MG tablet Take 50 mg by mouth at bedtime as needed for sleep.     traMADol (ULTRAM) 50 MG tablet Take 50 mg by mouth every 6 (six) hours as needed for moderate pain. (Patient not taking: Reported on 06/12/2021)     No current facility-administered medications for this visit.      Marland Kitchen  PHYSICAL EXAMINATION: ECOG PERFORMANCE STATUS: 1 - Symptomatic but completely ambulatory  Vitals:   06/12/21 1020  BP: 138/61  Pulse: 61  Resp: 18  Temp: 98 F (36.7 C)   Filed Weights   06/12/21 1020  Weight: 186 lb (84.4 kg)    Physical Exam Vitals and nursing note reviewed.  Constitutional:      Comments:  HENT:     Head: Normocephalic and atraumatic.     Mouth/Throat:     Mouth: Mucous membranes are moist.     Pharynx: No oropharyngeal exudate.  Eyes:     Pupils: Pupils are equal, round, and reactive to light.  Cardiovascular:     Rate and Rhythm: Normal rate and regular rhythm.  Pulmonary:     Effort: No respiratory distress.     Breath sounds: No wheezing.     Comments: Decreased breath sounds bilaterally at bases.  No wheeze or crackles Abdominal:     General: Bowel sounds are normal. There is no distension.     Palpations: Abdomen is soft. There is no mass.     Tenderness: There is no abdominal tenderness. There is no guarding or rebound.  Musculoskeletal:        General: No tenderness. Normal range of motion.     Cervical back: Normal range of motion and neck supple.  Skin:    General: Skin is warm.  Neurological:     Mental Status: She is alert and oriented to person, place, and time.  Psychiatric:        Mood and Affect: Affect normal.        Judgment: Judgment normal.    LABORATORY DATA:  I have reviewed the data as listed Lab Results  Component Value Date    WBC 7.9 06/12/2021   HGB 10.7 (L) 06/12/2021   HCT 31.8 (L) 06/12/2021   MCV 97.8 06/12/2021   PLT 493 (H) 06/12/2021   Recent Labs    12/08/20 1505 04/06/21 1153 06/12/21 0954  NA 141 136 134*  K 4.3 4.8 5.3*  CL 106 103 101  CO2 _0 GLUCOSE 81 94 82  BUN 23 29* 29*  CREATININE 1.11* 1.28* 1.46*  CALCIUM 9.2 9.4 9.0  GFRNONAA 55* 46* 39*  PROT 7.2 7.6 7.3  ALBUMIN 4.0 4.3 4.0  AST _1 ALT _2 ALKPHOS 66 75 71  BILITOT 0.7 0.7 0.7    RADIOGRAPHIC STUDIES: I have personally reviewed the radiological images as listed and agreed with the findings in the report. No results found.  ASSESSMENT & PLAN:   B12 deficiency anemia #Anemia/CKD-III [Dr.Murphy] August 2022-hemoglobin 10.4 iron saturation 22% ferritin-39-patient is symptomatic from fatigue. I had a long discussion with the patient regarding various forms of dietary iron as it is present universally in meat.  Also discussed regarding plant sources-including but not limited to leafy vegetables [spinach], broccoli etc.  Discussed regarding oral iron supplementation-including iron sulfate forms [65 mg of elemental iron; inexpensive-however gastric discomfort constipation] versus ironbiglycinate [20 mg elemental iron; slightly more expensive; less gastric discomfort/constipation].   If patient is intolerant to oral iron/or iron levels do not improve on oral supplementation- recommend IV iron infusions.   # #B12 deficiency August 2022 B12 level 460; proceed with B12 injectionout before injection to 3 months.  Continue B12 oral at home  #  chronic thrombocytosis and monocytosis [since 2012].   myeloproliferative disorder such as CMML without current need for treatment.  She has a mild normocytic anemia. April 2022-bone marrow biopsy-no acute process.  # CKD-III [Dr.Murphy, nephrology]-lupus versus others defer to nephrology.  Overall stable.  significant abnormalities.   # DISPOSITION: # B12 injection  today # follow up in 1st week of dec- MD; 2-3 days prior-labs-cbc/bmp/ B12 levels.  Possible B12 injection-Dr.B    All questions were answered. The patient knows to call  the clinic with any problems, questions or concerns.       Cammie Sickle, MD 06/14/2021 7:01 PM

## 2021-06-13 ENCOUNTER — Other Ambulatory Visit: Payer: Medicare Other

## 2021-06-13 ENCOUNTER — Ambulatory Visit: Payer: Medicare Other | Admitting: Internal Medicine

## 2021-06-14 ENCOUNTER — Telehealth: Payer: Self-pay | Admitting: *Deleted

## 2021-06-14 ENCOUNTER — Telehealth: Payer: Self-pay | Admitting: Internal Medicine

## 2021-06-14 ENCOUNTER — Encounter: Payer: Self-pay | Admitting: Internal Medicine

## 2021-06-14 NOTE — Telephone Encounter (Signed)
On 8/24-see # addendum  # DISPOSITION:  # follow up in 1st week of dec- MD; 2-3 days prior-labs-cbc/bmp/ B12 levels.  Possible B12 injection-Dr.B

## 2021-06-14 NOTE — Telephone Encounter (Addendum)
Per her request, the Patient was notified of her b12 levels and iron stores. Patient states that she does not have any follow-up with Dr. Rogue Bussing.  She also wants to know if she can only take the oral b12 (given improvement  of b12 levels) vs monthly b12 injections. Please advise.

## 2021-06-15 NOTE — Telephone Encounter (Signed)
Spoke with patient-apts a provided for patient.

## 2021-07-03 ENCOUNTER — Other Ambulatory Visit: Payer: Self-pay

## 2021-07-03 ENCOUNTER — Ambulatory Visit
Admission: RE | Admit: 2021-07-03 | Discharge: 2021-07-03 | Disposition: A | Discharge status: Home / Self Care | Payer: Medicare Other | Source: Ambulatory Visit | Attending: General Surgery | Admitting: General Surgery

## 2021-07-03 DIAGNOSIS — K824 Cholesterolosis of gallbladder: Secondary | ICD-10-CM | POA: Diagnosis not present

## 2021-07-06 DIAGNOSIS — R799 Abnormal finding of blood chemistry, unspecified: Secondary | ICD-10-CM | POA: Diagnosis not present

## 2021-07-06 DIAGNOSIS — N1832 Chronic kidney disease, stage 3b: Secondary | ICD-10-CM | POA: Diagnosis not present

## 2021-07-06 DIAGNOSIS — M3214 Glomerular disease in systemic lupus erythematosus: Secondary | ICD-10-CM | POA: Diagnosis not present

## 2021-07-10 ENCOUNTER — Other Ambulatory Visit: Payer: Self-pay

## 2021-07-10 ENCOUNTER — Ambulatory Visit
Admission: RE | Admit: 2021-07-10 | Discharge: 2021-07-10 | Disposition: A | Discharge status: Home / Self Care | Payer: Medicare Other | Source: Ambulatory Visit | Attending: Obstetrics and Gynecology | Admitting: Obstetrics and Gynecology

## 2021-07-10 DIAGNOSIS — Z1231 Encounter for screening mammogram for malignant neoplasm of breast: Secondary | ICD-10-CM | POA: Diagnosis not present

## 2021-07-11 ENCOUNTER — Ambulatory Visit: Payer: Medicare Other | Admitting: General Surgery

## 2021-07-11 ENCOUNTER — Encounter: Payer: Self-pay | Admitting: General Surgery

## 2021-07-11 VITALS — BP 110/68 | HR 77 | Temp 98.0°F | Ht 65.0 in | Wt 182.0 lb

## 2021-07-11 DIAGNOSIS — K589 Irritable bowel syndrome without diarrhea: Secondary | ICD-10-CM

## 2021-07-11 NOTE — Patient Instructions (Addendum)
Your gallbladder scan was normal, no need for any further scans for this.  Follow-up with our office as needed.  Please call and ask to speak with a nurse if you develop questions or concerns.   We have referred you to Gastroenterology. They will call you to schedule an appointment.

## 2021-07-11 NOTE — Progress Notes (Signed)
Patient ID: [name], female   DOB: Feb 02, 1956, 65 y.o.   MRN: [id number]  Chief Complaint  Patient presents with   Follow-up    HPI [name] is a 65 y.o. female.   I first saw her in September 2020.  My HPI from that visit is copied here:  "[name] was followed in the past by Dr. Jamal [name] and Dr. Bary [name].  [name] reports that [name] was having abdominal pain and digestive issues, such that [name] had pain with eating and "everything went straight to the bathroom" after [name] ate.  [name] had a right upper quadrant ultrasound in 2018.  This is negative for cholelithiasis, cholecystitis, or choledocholithiasis.  Small gallbladder polyps were identified.  [name] was seen by Dr. Bary [name] last year after having a repeat ultrasound with similar findings.  He is here today for follow-up after having had a third ultrasound.  [name] states that [name] does not experience any nausea or vomiting and does not have any significant pain in her abdomen/right upper quadrant.  [name] continues to have a bowel movement very shortly after eating.  [name] says that it is not necessarily diarrhea and can be solid at times.  [name] has taken to avoiding greasy foods and has cut out most sodas.  [name] has never had right upper quadrant pain associated with eating.  [name] has never had pancreatitis or jaundice.  [name] has had both upper and lower [name] without an identifiable etiology for her symptoms."  At that visit, we elected to pursue a repeat ultrasound in 2 years time.  [name] has undergone that study and is here today to discuss the results.  [name] continues to endorse a plethora of issues unrelated to her gallbladder.  [name] continues to have rapid transit through her GI tract and frequently needs to defecate immediately after eating.  This is sometimes diarrhea and sometimes solid.  [name] denies any right upper quadrant pain, including any associated with eating.   Past Medical History:  Diagnosis Date   Abnormal pap    Anemia    Anxiety     History of lupus 1987   Hypertension    Low vitamin D level    Renal insufficiency 1988   kidney biopsy 1988 and 1998- "my kidneys function at like 48%"    Past Surgical History:  Procedure Laterality Date   BONE MARROW BIOPSY  01/2021   BREAST BIOPSY Right 07/08/2017   BREAST EXCISIONAL BIOPSY Right 08/06/2017   BREAST EXCISIONAL BIOPSY Right 07/1999   BREAST LUMPECTOMY WITH RADIOACTIVE SEED LOCALIZATION Right 08/06/2017   Procedure: RIGHT BREAST LUMPECTOMY WITH RADIOACTIVE SEED LOCALIZATION ERAS PATHWAY;  Surgeon: Kristine Seltzer, MD;  Location: [name];  Service: General;  Laterality: Right;   BREAST LUMPECTOMY WITH RADIOACTIVE SEED LOCALIZATION Right 08/19/2018   Procedure: RIGHT BREAST LUMPECTOMY [place];  Surgeon: Kristine Seltzer, MD;  Location: [name];  Service: General;  Laterality: Right;   BREAST SURGERY  07/1999   right breast benign fibroadenoma   COLONOSCOPY  2012   COLONOSCOPY WITH PROPOFOL N/A 05/01/2017   Procedure: COLONOSCOPY WITH PROPOFOL;  Surgeon: Kristine Lye, MD;  Location: [name];  Service: [name];  Laterality: N/A;   CRYOTHERAPY     abnormal pap smear   ESOPHAGOGASTRODUODENOSCOPY (EGD) WITH PROPOFOL N/A 05/01/2017   Procedure: ESOPHAGOGASTRODUODENOSCOPY (EGD) WITH PROPOFOL;  Surgeon: Kristine Lye, MD;  Location: [name];  Service: [name];  Laterality: N/A;   NASAL SEPTUM SURGERY  2007  TUBAL LIGATION  1990    Family History  Problem Relation Age of Onset   Cancer Father        oral cancer   Cancer Maternal Grandmother        ovarian cancer   Hypertension Maternal Grandmother    Diabetes Maternal Grandfather    Diabetes Paternal Aunt    Breast cancer Neg Hx     Social History Social History   Tobacco Use   Smoking status: Never   Smokeless tobacco: Never  Vaping Use   Vaping Use: Never used  Substance Use Topics   Alcohol use: Yes     Alcohol/week: 4.0 - 6.0 standard drinks    Types: 4 - 6 Standard drinks or equivalent per week    Comment: few beers on friday and saturday   Drug use: Yes    Types: Marijuana    Comment: few times a month     Allergies  Allergen Reactions   Elemental Sulfur Hives   Sulfa Antibiotics Hives, Itching and Rash    Current Outpatient Medications  Medication Sig Dispense Refill   ALPRAZolam (XANAX) 0.25 MG tablet Take 0.25 mg by mouth daily as needed for anxiety.      aspirin 81 MG tablet Take 81 mg by mouth daily.     chlorthalidone (HYGROTON) 25 MG tablet Take 12.5 mg by mouth in the morning.     Cholecalciferol (VITAMIN D3 PO) Take 1 tablet by mouth daily.      cyanocobalamin 1000 MCG tablet Take 1,000 mcg by mouth daily. Vitamin B12     enalapril (VASOTEC) 20 MG tablet Take 20 mg by mouth 2 (two) times daily.     Ferrous Sulfate (IRON) 325 (65 Fe) MG TABS Take by mouth.     fluticasone (FLONASE) 50 MCG/ACT nasal spray Place 1 spray into both nostrils as needed.     hydrochlorothiazide (HYDRODIURIL) 12.5 MG tablet Take 12.5 mg by mouth daily.     hydroxychloroquine (PLAQUENIL) 200 MG tablet Take 200 mg by mouth every other day.      omeprazole (PRILOSEC) 20 MG capsule Take 20 mg by mouth daily.     traMADol (ULTRAM) 50 MG tablet Take 50 mg by mouth every 6 (six) hours as needed for moderate pain.     traZODone (DESYREL) 50 MG tablet Take 50 mg by mouth at bedtime as needed for sleep.     No current facility-administered medications for this visit.    Review of Systems Review of Systems  Gastrointestinal:  Positive for diarrhea.       GERD  Genitourinary:        Stress incontinence  All other systems reviewed and are negative.  Blood pressure 110/68, pulse 77, temperature 98 F (36.7 C), height $RemoveBe'5\' 5"'OdtIPtVzi$  (1.651 m), weight 182 lb (82.6 kg), last menstrual period 08/22/2008, SpO2 97 %. Body mass index is 30.29 kg/m.  Physical Exam Physical Exam Constitutional:      General: [name]  is not in acute distress.    Appearance: [name] is obese.  HENT:     Head: Normocephalic and atraumatic.     Nose:     Comments: Covered with a mask    Mouth/Throat:     Comments: Covered with a mask Eyes:     General: No scleral icterus.       Right eye: No discharge.        Left eye: No discharge.  Neck:     Comments: No palpable cervical  or supraclavicular lymphadenopathy.  The trachea is midline.  No dominant thyroid masses or thyromegaly appreciated.  The gland moves freely with deglutition. Cardiovascular:     Rate and Rhythm: Normal rate and regular rhythm.     Pulses: Normal pulses.  Pulmonary:     Effort: Pulmonary effort is normal.     Breath sounds: Normal breath sounds.  Abdominal:     General: Bowel sounds are normal.     Palpations: Abdomen is soft.     Tenderness: There is no abdominal tenderness.  Genitourinary:    Comments: Deferred Musculoskeletal:        General: No swelling or deformity.  Skin:    General: Skin is warm and dry.  Neurological:     General: No focal deficit present.     Mental Status: [name] is alert and oriented to person, place, and time.  Psychiatric:        Mood and Affect: Mood normal.        Behavior: Behavior normal.    Data Reviewed I reviewed the ultrasound performed on July 03, 2021.  I concur with the radiology interpretation which is copied here:  CLINICAL DATA:  Gallbladder polyps.   EXAM: ULTRASOUND ABDOMEN LIMITED RIGHT UPPER QUADRANT   COMPARISON:  Ultrasound abdomen 07/02/2019   FINDINGS: Gallbladder:   Two unchanged gallbladder polyp measuring up to 4 and 3 mm. No gallstones or wall thickening visualized. No sonographic Murphy sign noted by sonographer.   Common bile duct:   Diameter: 6 mm.   Liver:   No focal lesion identified. Within normal limits in parenchymal echogenicity. Portal vein is patent on color Doppler imaging with normal direction of blood flow towards the liver.   Other: Incidentally  noted partially visualized 2.3 x 2.2 x 2.1 cm right renal cystic lesion.   IMPRESSION: 1. Couple of unchanged in size gallbladder polyps. Given size less than 6 mm, no further follow-up indicated. 2. Incidentally noted partially visualized 2.3 x 2.2 x 2.1 cm right renal cystic lesion.  Assessment This is a 65 year old woman with stable gallbladder polyps that have been followed for many years.  I do not think they are in any way responsible for her other GI complaints.  Her prior endoscopies were performed by a surgeon rather than a gastroenterologist and it has been some time since this was done.  Her symptoms seem more compatible with irritable bowel syndrome.  Plan We will refer her to gastroenterology for further evaluation of possible irritable bowel syndrome.  [name] does not need any further follow-up for the gallbladder polyps.  We will see her on an as-needed basis.    Fredirick Maudlin 07/11/2021, 2:22 PM

## 2021-07-12 ENCOUNTER — Encounter: Payer: Self-pay | Admitting: *Deleted

## 2021-07-20 ENCOUNTER — Other Ambulatory Visit: Payer: Self-pay

## 2021-07-20 ENCOUNTER — Encounter: Payer: Self-pay | Admitting: Dietician

## 2021-07-20 ENCOUNTER — Encounter: Payer: Medicare Other | Attending: Nurse Practitioner | Admitting: Dietician

## 2021-07-20 VITALS — Ht 66.0 in | Wt 182.1 lb

## 2021-07-20 DIAGNOSIS — N183 Chronic kidney disease, stage 3 unspecified: Secondary | ICD-10-CM | POA: Diagnosis not present

## 2021-07-20 DIAGNOSIS — E663 Overweight: Secondary | ICD-10-CM | POA: Diagnosis not present

## 2021-07-20 DIAGNOSIS — Z6830 Body mass index (BMI) 30.0-30.9, adult: Secondary | ICD-10-CM | POA: Insufficient documentation

## 2021-07-20 NOTE — Progress Notes (Signed)
Medical Nutrition Therapy: Visit start time: 5361  end time: 1745  Assessment:  Diagnosis: CKD Stage 3, overweight Past medical history: IBS Psychosocial issues/ stress concerns: none  Preferred learning method:  Auditory Hands-on  Current weight: 182.1lbs Height: 5'6" BMI Medications, supplements: reconciled list in medical record  Progress and evaluation:  Patient reports losing down to 172lbs in 2019 when participating in exercise classes; work schedule and other health restrictions led to decline in exercise and weight regain.  Recent labs: 07/06/21 sodium 135, potassium 4.8, BUN 30, creatinine 1.3, GFR 45; 06/12/21 sodaium 134, potassium 5.3, GFR 39;  History of B12 deficiency -- had infusions 7/22, and B12 rose to 442pg/ml from 126 Avoids foods with seeds, spicy foods, high fat foods due to GI symptoms from diverticulosis/ IBS  Physical activity: walking 30-45 minutes, 3-4 times a week  Dietary Intake:  Usual eating pattern includes 3 meals and 1-2 snacks per day. Dining out frequency: 5-7 meals per week.  Breakfast: 9/29 sausage biscuit (ate 3/4); often yogurt + toast or banana + 1 c coffee (2 on weekends) with sugar and creamer Snack: none Lunch: 9/29--1/4 biscuit, blueberry muffin; often salad with cucumber, cheese, Limits tomatoes + dressing; pimento or chicken salad sandwich on whole/ honey wheat bread Snack: cheese stick; grapes; cucumber with salt, pepper; tomato (in summer) Supper: light meal -- 9/28 bbq sandwich;  Snack: occasional -- pb crackers + milk; saltine crackers with chicken salad; small fruit cup peaches or pineapple Beverages: water, occ pepsi, sweet tea or 1/2 sweet, 1-2c coffee, beers on weekends  Nutrition Care Education: Topics covered:  Basic nutrition: basic food groups, appropriate nutrient balance, appropriate meal and snack schedule, general nutrition guidelines    Weight control: importance of low sugar and low fat choices; portion control;  estimated energy needs for weight loss at 1300 kcal, provided guidance for 50% CHO, 20% pro, 30% fat Advanced nutrition:  recipe modification, cooking techniques-- limiting sodium, alternate seasoning options Hypertension and renal disease:  importance of controlling BP; identifying high sodium foods; identifying food sources of potassium, magnesium; controlling portions of meats/ high protein foods   Nutritional Diagnosis:  [place]-2.2 Altered nutrition-related laboratory As related to CKD.  As evidenced by low GFR. [place]-3.3 Overweight/obesity As related to history of excess calories and inadequate physical activity due to knee injury.  As evidenced by patient with current BMI of 29.39.  Intervention:  Instruction and discussion as noted above. Patient voices readiness to work on changes; some fatigue and differing work hours make homemade meals difficult at times. Established goals for change with direction from patient.    Education Materials given:  Museum/gallery conservator with food lists, sample meal pattern Sample menus Following a Low Sodium Diet Visit summary with goals/ instructions   Learner/ who was taught:  Patient   Level of understanding: Verbalizes/ demonstrates competency  Demonstrated degree of understanding via:   Teach back Learning barriers: None  Willingness to learn/ readiness for change: Acceptance, ready for change   Monitoring and Evaluation:  Dietary intake, exercise, renal function, GI symptoms, and body weight      follow up:  09/20/21 at 4:30pm

## 2021-07-20 NOTE — Patient Instructions (Signed)
Plan for each meal to include a vegetable and/or a fruit, eat generous portions. Use frozen or fresh rather than canned to limit sodium; if using canned, choose low sodium or at least dump the liquid and heat in fresh water. Try adding low sodium seasoning blends like Mrs Deliah Boston, garlic, onion to season.  Try Healthy Choice canned soups as a lower sodium option.  Keep portions of meats to size of a palm of hand or less.

## 2021-07-31 DIAGNOSIS — R059 Cough, unspecified: Secondary | ICD-10-CM | POA: Diagnosis not present

## 2021-07-31 DIAGNOSIS — J019 Acute sinusitis, unspecified: Secondary | ICD-10-CM | POA: Diagnosis not present

## 2021-08-08 ENCOUNTER — Encounter: Payer: Self-pay | Admitting: General Surgery

## 2021-08-30 DIAGNOSIS — Z79899 Other long term (current) drug therapy: Secondary | ICD-10-CM | POA: Diagnosis not present

## 2021-08-30 DIAGNOSIS — Z8669 Personal history of other diseases of the nervous system and sense organs: Secondary | ICD-10-CM | POA: Diagnosis not present

## 2021-08-30 DIAGNOSIS — M3210 Systemic lupus erythematosus, organ or system involvement unspecified: Secondary | ICD-10-CM | POA: Diagnosis not present

## 2021-08-30 DIAGNOSIS — H43812 Vitreous degeneration, left eye: Secondary | ICD-10-CM | POA: Diagnosis not present

## 2021-08-30 DIAGNOSIS — H52223 Regular astigmatism, bilateral: Secondary | ICD-10-CM | POA: Diagnosis not present

## 2021-08-30 DIAGNOSIS — H2513 Age-related nuclear cataract, bilateral: Secondary | ICD-10-CM | POA: Diagnosis not present

## 2021-08-30 DIAGNOSIS — H5213 Myopia, bilateral: Secondary | ICD-10-CM | POA: Diagnosis not present

## 2021-08-30 DIAGNOSIS — H524 Presbyopia: Secondary | ICD-10-CM | POA: Diagnosis not present

## 2021-09-05 ENCOUNTER — Ambulatory Visit
Admission: RE | Admit: 2021-09-05 | Discharge: 2021-09-05 | Disposition: A | Discharge status: Home / Self Care | Payer: Medicare Other | Source: Home / Self Care | Attending: Nurse Practitioner | Admitting: Nurse Practitioner

## 2021-09-05 ENCOUNTER — Encounter: Payer: Self-pay | Admitting: Internal Medicine

## 2021-09-05 ENCOUNTER — Encounter: Payer: Self-pay | Admitting: Nurse Practitioner

## 2021-09-05 ENCOUNTER — Other Ambulatory Visit: Payer: Self-pay

## 2021-09-05 ENCOUNTER — Ambulatory Visit (INDEPENDENT_AMBULATORY_CARE_PROVIDER_SITE_OTHER): Payer: Medicare Other | Admitting: Nurse Practitioner

## 2021-09-05 ENCOUNTER — Ambulatory Visit
Admission: RE | Admit: 2021-09-05 | Discharge: 2021-09-05 | Disposition: A | Discharge status: Home / Self Care | Payer: Medicare Other | Source: Ambulatory Visit | Attending: Nurse Practitioner | Admitting: Nurse Practitioner

## 2021-09-05 VITALS — BP 126/65 | HR 89 | Temp 98.5°F | Ht 65.0 in | Wt 178.2 lb

## 2021-09-05 DIAGNOSIS — R0602 Shortness of breath: Secondary | ICD-10-CM | POA: Diagnosis not present

## 2021-09-05 DIAGNOSIS — D2372 Other benign neoplasm of skin of left lower limb, including hip: Secondary | ICD-10-CM | POA: Diagnosis not present

## 2021-09-05 DIAGNOSIS — G4700 Insomnia, unspecified: Secondary | ICD-10-CM | POA: Diagnosis not present

## 2021-09-05 DIAGNOSIS — R052 Subacute cough: Secondary | ICD-10-CM | POA: Diagnosis not present

## 2021-09-05 DIAGNOSIS — J320 Chronic maxillary sinusitis: Secondary | ICD-10-CM

## 2021-09-05 DIAGNOSIS — R059 Cough, unspecified: Secondary | ICD-10-CM | POA: Diagnosis not present

## 2021-09-05 DIAGNOSIS — M79672 Pain in left foot: Secondary | ICD-10-CM | POA: Diagnosis not present

## 2021-09-05 MED ORDER — DOXYCYCLINE HYCLATE 100 MG PO TABS: 100.0000 mg | ORAL_TABLET | Freq: Two times a day (BID) | ORAL | 0 refills | Status: AC

## 2021-09-05 MED ORDER — TRAZODONE HCL 50 MG PO TABS: 50.0000 mg | ORAL_TABLET | Freq: Every evening | ORAL | 0 refills | Status: DC | PRN

## 2021-09-05 MED ORDER — PREDNISONE 10 MG PO TABS: 10.0000 mg | ORAL_TABLET | Freq: Every day | ORAL | 0 refills | Status: DC

## 2021-09-05 NOTE — Addendum Note (Signed)
Addended by: Jon Billings on: 09/05/2021 09:41 AM   Modules accepted: Orders

## 2021-09-05 NOTE — Assessment & Plan Note (Signed)
Refilled Trazodone for patient. Discussed side effects and benefits of medication with patient during visit.

## 2021-09-05 NOTE — Progress Notes (Addendum)
BP 126/65   Pulse 89   Temp 98.5 F (36.9 C) (Oral)   Ht 5\' 5"  (1.651 m)   Wt 178 lb 3.2 oz (80.8 kg)   LMP 08/22/2008   SpO2 98%   BMI 29.65 kg/m    Subjective:    Patient ID: [name], female    DOB: 02/26/1956, 65 y.o.   MRN: [id number]  HPI: [name] is a 65 y.o. female  Chief Complaint  Patient presents with   URI    Pt states she went to [facility] 07/31/21, given Augmentin and cough syrup. States she took these and felt a little bit better. States symptoms of headache, cough, congestion, and sinus pressure came back. States she has been taking Mucinex and Coricidin OTC for symptoms.    Anxiety    Pt requesting Xanax refill    Patient states she was seen at Urgent care on October 10 and was given Augmentin and cough medicine.  Her symptoms improved some then her symptoms returned.  She has been taking mucinex and coricidin. Patient states she raked and burnt leaves over the weekend.  Her symptoms just are not improving.  She is coughing a lot in the mornings.  Sometimes after the shower she feels like she is able to get some congestion out.  She feels dry in her head in the morning.  Patient has trouble with anxiety.  She was taking xanax PRN. Does use Trazodone PRN for sleep.   Relevant past medical, surgical, family and social history reviewed and updated as indicated. Interim medical history since our last visit reviewed. Allergies and medications reviewed and updated.  Review of Systems  Constitutional:  Positive for fatigue. Negative for fever.  HENT:  Positive for congestion, ear pain, postnasal drip, sinus pressure and sinus pain. Negative for dental problem, rhinorrhea, sneezing and sore throat.   Respiratory:  Positive for cough. Negative for shortness of breath and wheezing.   Cardiovascular:  Negative for chest pain.  Gastrointestinal:  Negative for vomiting.  Skin:  Negative for rash.  Neurological:  Positive for headaches.   Per HPI unless  specifically indicated above     Objective:    BP 126/65   Pulse 89   Temp 98.5 F (36.9 C) (Oral)   Ht 5\' 5"  (1.651 m)   Wt 178 lb 3.2 oz (80.8 kg)   LMP 08/22/2008   SpO2 98%   BMI 29.65 kg/m   Wt Readings from Last 3 Encounters:  09/05/21 178 lb 3.2 oz (80.8 kg)  07/20/21 182 lb 1.6 oz (82.6 kg)  07/11/21 182 lb (82.6 kg)    Physical Exam Vitals and nursing note reviewed.  Constitutional:      General: She is not in acute distress.    Appearance: Normal appearance. She is normal weight. She is not ill-appearing, toxic-appearing or diaphoretic.  HENT:     Head: Normocephalic.     Right Ear: External ear normal. A middle ear effusion is present. Tympanic membrane is not erythematous.     Left Ear: External ear normal. A middle ear effusion is present. Tympanic membrane is not erythematous.     Nose: Congestion and rhinorrhea present.     Mouth/Throat:     Mouth: Mucous membranes are moist.     Pharynx: Oropharynx is clear. Posterior oropharyngeal erythema present. No oropharyngeal exudate.  Eyes:     General:        Right eye: No discharge.  Left eye: No discharge.     Extraocular Movements: Extraocular movements intact.     Conjunctiva/sclera: Conjunctivae normal.     Pupils: Pupils are equal, round, and reactive to light.  Cardiovascular:     Rate and Rhythm: Normal rate and regular rhythm.     Heart sounds: No murmur heard. Pulmonary:     Effort: Pulmonary effort is normal. No respiratory distress.     Breath sounds: Normal breath sounds. No wheezing or rales.  Musculoskeletal:     Cervical back: Normal range of motion and neck supple.  Skin:    General: Skin is warm and dry.     Capillary Refill: Capillary refill takes less than 2 seconds.  Neurological:     General: No focal deficit present.     Mental Status: She is alert and oriented to person, place, and time. Mental status is at baseline.  Psychiatric:        Mood and Affect: Mood normal.         Behavior: Behavior normal.        Thought Content: Thought content normal.        Judgment: Judgment normal.    Results for orders placed or performed in visit on 06/12/21  Uric acid  Result Value Ref Range   Uric Acid, Serum 8.1 (H) 2.5 - 7.1 mg/dL  Vitamin B12  Result Value Ref Range   Vitamin B-12 442 180 - 914 pg/mL  Iron and TIBC  Result Value Ref Range   Iron 81 28 - 170 ug/dL   TIBC 370 250 - 450 ug/dL   Saturation Ratios 22 10.4 - 31.8 %   UIBC 289 ug/dL  Ferritin  Result Value Ref Range   Ferritin 39 11 - 307 ng/mL  Lactate dehydrogenase  Result Value Ref Range   LDH 168 98 - 192 U/L  Comprehensive metabolic panel  Result Value Ref Range   Sodium 134 (L) 135 - 145 mmol/L   Potassium 5.3 (H) 3.5 - 5.1 mmol/L   Chloride 101 98 - 111 mmol/L   CO2 27 22 - 32 mmol/L   Glucose, Bld 82 70 - 99 mg/dL   BUN 29 (H) 8 - 23 mg/dL   Creatinine, Ser 1.46 (H) 0.44 - 1.00 mg/dL   Calcium 9.0 8.9 - 10.3 mg/dL   Total Protein 7.3 6.5 - 8.1 g/dL   Albumin 4.0 3.5 - 5.0 g/dL   AST 19 15 - 41 U/L   ALT 10 0 - 44 U/L   Alkaline Phosphatase 71 38 - 126 U/L   Total Bilirubin 0.7 0.3 - 1.2 mg/dL   GFR, Estimated 39 (L) >60 mL/min   Anion gap 6 5 - 15  CBC with Differential  Result Value Ref Range   WBC 7.9 4.0 - 10.5 K/uL   RBC 3.25 (L) 3.87 - 5.11 MIL/uL   Hemoglobin 10.7 (L) 12.0 - 15.0 g/dL   HCT 31.8 (L) 36.0 - 46.0 %   MCV 97.8 80.0 - 100.0 fL   MCH 32.9 26.0 - 34.0 pg   MCHC 33.6 30.0 - 36.0 g/dL   RDW 13.5 11.5 - 15.5 %   Platelets 493 (H) 150 - 400 K/uL   nRBC 0.0 0.0 - 0.2 %   Neutrophils Relative % 54 %   Neutro Abs 4.3 1.7 - 7.7 K/uL   Lymphocytes Relative 23 %   Lymphs Abs 1.9 0.7 - 4.0 K/uL   Monocytes Relative 15 %   Monocytes Absolute 1.2 (  H) 0.1 - 1.0 K/uL   Eosinophils Relative 6 %   Eosinophils Absolute 0.5 0.0 - 0.5 K/uL   Basophils Relative 1 %   Basophils Absolute 0.1 0.0 - 0.1 K/uL   Immature Granulocytes 1 %   Abs Immature Granulocytes 0.07 0.00  - 0.07 K/uL      Assessment & Plan:   Problem List Items Addressed This Visit   None Visit Diagnoses     Chronic maxillary sinusitis    -  Primary   Will treat with Doxy and prednisone. Compelte course of medications. If not improved, will send to allergy. Recommend staying hydrated and using probiotic.   Relevant Medications   doxycycline (VIBRA-TABS) 100 MG tablet   predniSONE (DELTASONE) 10 MG tablet   Subacute cough       Will order chest xray.  Will make recommendations based on imaging results.    Relevant Orders   DG Chest 2 View        Follow up plan: Return if symptoms worsen or fail to improve.

## 2021-09-05 NOTE — Patient Instructions (Signed)
[employer] [place]

## 2021-09-06 ENCOUNTER — Telehealth: Payer: Self-pay | Admitting: Nurse Practitioner

## 2021-09-06 NOTE — Telephone Encounter (Signed)
Called pt and answered question, appointment scheduled.

## 2021-09-06 NOTE — Telephone Encounter (Signed)
Patient called in asking if she has to wait until she finishes taking the antibiotics before getting the flu shot. Please cakl back

## 2021-09-06 NOTE — Progress Notes (Signed)
Please let patient know that her chest xray was normal.

## 2021-09-08 ENCOUNTER — Encounter: Payer: Self-pay | Admitting: Internal Medicine

## 2021-09-11 ENCOUNTER — Ambulatory Visit: Payer: Medicare Other

## 2021-09-18 ENCOUNTER — Ambulatory Visit: Payer: Medicare Other

## 2021-09-20 ENCOUNTER — Other Ambulatory Visit: Payer: Self-pay | Admitting: *Deleted

## 2021-09-20 ENCOUNTER — Ambulatory Visit: Payer: Medicare Other | Admitting: Dietician

## 2021-09-20 DIAGNOSIS — D519 Vitamin B12 deficiency anemia, unspecified: Secondary | ICD-10-CM

## 2021-09-21 ENCOUNTER — Ambulatory Visit: Payer: Medicare Other | Admitting: Dietician

## 2021-09-23 ENCOUNTER — Other Ambulatory Visit: Payer: Self-pay | Admitting: Nurse Practitioner

## 2021-09-24 NOTE — Telephone Encounter (Signed)
Requested medication (s) are due for refill today: yes  Requested medication (s) are on the active medication list: yes  Last refill:  09/05/21 #42  Future visit scheduled: no  Notes to clinic:  end date 09/26/21 --lab work due   Requested Prescriptions  Pending Prescriptions Disp Refills   doxycycline (VIBRA-TABS) 100 MG tablet [Pharmacy Med Name: DOXYCYCLINE HYCLATE 100 MG TAB] 42 tablet 0    Sig: Take 1 tablet (100 mg total) by mouth 2 (two) times daily for 21 days.     Off-Protocol Failed - 09/23/2021  9:54 AM      Failed - Medication not assigned to a protocol, review manually.      Passed - Valid encounter within last 12 months    Recent Outpatient Visits           2 weeks ago Chronic maxillary sinusitis   [place], [place]   3 years ago Cellulitis of right lower extremity   [facility] [place], [place]   3 years ago Stage III lupus nephritis (WHO) [facility]   [facility] [place], [place]

## 2021-09-25 ENCOUNTER — Inpatient Hospital Stay: Payer: Medicare Other | Attending: Internal Medicine

## 2021-09-25 ENCOUNTER — Ambulatory Visit: Payer: Medicare Other

## 2021-09-25 ENCOUNTER — Other Ambulatory Visit: Payer: Self-pay

## 2021-09-25 ENCOUNTER — Ambulatory Visit: Payer: Medicare Other | Admitting: Internal Medicine

## 2021-09-25 DIAGNOSIS — N183 Chronic kidney disease, stage 3 unspecified: Secondary | ICD-10-CM | POA: Insufficient documentation

## 2021-09-25 DIAGNOSIS — D631 Anemia in chronic kidney disease: Secondary | ICD-10-CM | POA: Diagnosis not present

## 2021-09-25 DIAGNOSIS — D72821 Monocytosis (symptomatic): Secondary | ICD-10-CM | POA: Diagnosis not present

## 2021-09-25 DIAGNOSIS — F419 Anxiety disorder, unspecified: Secondary | ICD-10-CM | POA: Diagnosis not present

## 2021-09-25 DIAGNOSIS — Z7982 Long term (current) use of aspirin: Secondary | ICD-10-CM | POA: Diagnosis not present

## 2021-09-25 DIAGNOSIS — Z79899 Other long term (current) drug therapy: Secondary | ICD-10-CM | POA: Diagnosis not present

## 2021-09-25 DIAGNOSIS — E538 Deficiency of other specified B group vitamins: Secondary | ICD-10-CM | POA: Insufficient documentation

## 2021-09-25 DIAGNOSIS — D509 Iron deficiency anemia, unspecified: Secondary | ICD-10-CM | POA: Diagnosis not present

## 2021-09-25 DIAGNOSIS — D75839 Thrombocytosis, unspecified: Secondary | ICD-10-CM | POA: Insufficient documentation

## 2021-09-25 DIAGNOSIS — D519 Vitamin B12 deficiency anemia, unspecified: Secondary | ICD-10-CM

## 2021-09-25 LAB — CBC WITH DIFFERENTIAL/PLATELET
Abs Immature Granulocytes: 0.02 10*3/uL (ref 0.00–0.07)
Basophils Absolute: 0.1 10*3/uL (ref 0.0–0.1)
Basophils Relative: 1 %
Eosinophils Absolute: 0.2 10*3/uL (ref 0.0–0.5)
Eosinophils Relative: 3 %
HCT: 30.6 % — ABNORMAL LOW (ref 36.0–46.0)
Hemoglobin: 10.3 g/dL — ABNORMAL LOW (ref 12.0–15.0)
Immature Granulocytes: 0 %
Lymphocytes Relative: 25 %
Lymphs Abs: 2.3 10*3/uL (ref 0.7–4.0)
MCH: 32.9 pg (ref 26.0–34.0)
MCHC: 33.7 g/dL (ref 30.0–36.0)
MCV: 97.8 fL (ref 80.0–100.0)
Monocytes Absolute: 1.3 10*3/uL — ABNORMAL HIGH (ref 0.1–1.0)
Monocytes Relative: 14 %
Neutro Abs: 5.4 10*3/uL (ref 1.7–7.7)
Neutrophils Relative %: 57 %
Platelets: 494 10*3/uL — ABNORMAL HIGH (ref 150–400)
RBC: 3.13 MIL/uL — ABNORMAL LOW (ref 3.87–5.11)
RDW: 13.6 % (ref 11.5–15.5)
WBC: 9.3 10*3/uL (ref 4.0–10.5)
nRBC: 0 % (ref 0.0–0.2)

## 2021-09-25 LAB — BASIC METABOLIC PANEL
Anion gap: 7 (ref 5–15)
BUN: 30 mg/dL — ABNORMAL HIGH (ref 8–23)
CO2: 26 mmol/L (ref 22–32)
Calcium: 9 mg/dL (ref 8.9–10.3)
Chloride: 103 mmol/L (ref 98–111)
Creatinine, Ser: 1.21 mg/dL — ABNORMAL HIGH (ref 0.44–1.00)
GFR, Estimated: 49 mL/min — ABNORMAL LOW (ref >60)
Glucose, Bld: 89 mg/dL (ref 70–99)
Potassium: 4.4 mmol/L (ref 3.5–5.1)
Sodium: 136 mmol/L (ref 135–145)

## 2021-09-25 LAB — VITAMIN B12: Vitamin B-12: 532 pg/mL (ref 180–914)

## 2021-09-27 ENCOUNTER — Encounter: Payer: Self-pay | Admitting: Internal Medicine

## 2021-09-27 ENCOUNTER — Inpatient Hospital Stay (HOSPITAL_BASED_OUTPATIENT_CLINIC_OR_DEPARTMENT_OTHER): Payer: Medicare Other | Admitting: Internal Medicine

## 2021-09-27 ENCOUNTER — Inpatient Hospital Stay: Payer: Medicare Other

## 2021-09-27 ENCOUNTER — Other Ambulatory Visit: Payer: Self-pay

## 2021-09-27 VITALS — BP 122/68 | HR 77 | Temp 98.1°F | Resp 16 | Wt 179.0 lb

## 2021-09-27 DIAGNOSIS — D649 Anemia, unspecified: Secondary | ICD-10-CM

## 2021-09-27 DIAGNOSIS — E538 Deficiency of other specified B group vitamins: Secondary | ICD-10-CM | POA: Diagnosis not present

## 2021-09-27 DIAGNOSIS — D75839 Thrombocytosis, unspecified: Secondary | ICD-10-CM | POA: Diagnosis not present

## 2021-09-27 DIAGNOSIS — D519 Vitamin B12 deficiency anemia, unspecified: Secondary | ICD-10-CM

## 2021-09-27 DIAGNOSIS — D72821 Monocytosis (symptomatic): Secondary | ICD-10-CM | POA: Diagnosis not present

## 2021-09-27 DIAGNOSIS — Z7982 Long term (current) use of aspirin: Secondary | ICD-10-CM | POA: Diagnosis not present

## 2021-09-27 DIAGNOSIS — D509 Iron deficiency anemia, unspecified: Secondary | ICD-10-CM | POA: Diagnosis not present

## 2021-09-27 DIAGNOSIS — Z79899 Other long term (current) drug therapy: Secondary | ICD-10-CM | POA: Diagnosis not present

## 2021-09-27 DIAGNOSIS — D631 Anemia in chronic kidney disease: Secondary | ICD-10-CM | POA: Diagnosis not present

## 2021-09-27 DIAGNOSIS — N183 Chronic kidney disease, stage 3 unspecified: Secondary | ICD-10-CM | POA: Diagnosis not present

## 2021-09-27 MED ORDER — CYANOCOBALAMIN 1000 MCG/ML IJ SOLN
1000.0000 ug | Freq: Once | INTRAMUSCULAR | Status: AC
  Administered 2021-09-27: 1000 ug via INTRAMUSCULAR
  Filled 2021-09-27: qty 1

## 2021-09-27 NOTE — Assessment & Plan Note (Addendum)
#  Anemia/CKD-III ] August 2022- Hemoglobin 10.7 [AUG 2022] iron saturation 22% ferritin-39-patient is symptomatic from fatigued; but improved. Continue PO iron.  Discussed regarding IV iron.  Patient wants to hold off.  # #B12 deficiency [MAY 2022- b12-126]August 2022 B12 level 460; proceed with B12 injection q 79M;   Continue B12 oral at home.   #  Chronicthrombocytosis and monocytosis [since 2012].  myeloproliferative disorder such as CMML without current need for treatment. She has a mild normocytic anemia. April 2022-bone marrow biopsy-no acute process.  # CKD-III [Dr.Murphy, nephrology]-lupus versus others defer to nephrology.  Overall stable.  significant abnormalities.  # Anxiety- recommend evaluation with psychiatry.  We will make a referral to psychiatry.  # DISPOSITION: # refer to Psychiatry re: anxiety # B12 injection today # follow up in 3 months- B12 injection # follow up in 6 months- MD; 2-3 days prior-labs-cbc/bmp/ iron studies/ferritin; LDH-B12 levels.  Possible B12 injection-Dr.B

## 2021-09-27 NOTE — Progress Notes (Signed)
[place] NOTE  Patient Care Team: Jon Billings, NP as PCP - General Kem Boroughs, FNP as Nurse Practitioner (Family Medicine) Christene Lye, MD (General Surgery) Emmaline Kluver., MD (Rheumatology) Yisroel Ramming, Everardo All, MD as Consulting Physician (Obstetrics and Gynecology) Cammie Sickle, MD as Consulting Physician (Hematology and Oncology)  CHIEF COMPLAINTS/PURPOSE OF CONSULTATION: Anemia  #chronic  Anemia/CKD-III-IDA  # #B12 deficiency  #  chronic thrombocytosis and monocytosis [since 2012].   myeloproliferative disorder such as CMML- April 2022-bone marrow biopsy-no acute process- likely reactive MPN- work up NEGATIVE.  # CKD-III [Dr.Murphy, nephrology]-lupus versus others- [Dr.Murphy]    Oncology History   No history exists.   HISTORY OF PRESENTING ILLNESS: Alone.  Ambulating independently. [name] 65 y.o.  female with a history of chronic anemia/chronic mild monocytosis/thrombocytosis is here for follow-up.  Patient complains of chronic fatigue.  States that she is under of stress-from social issues-her aunt under hospice.  States that she cannot get anxiety medication from PCP.  Complains of difficulties sleeping.   Denies any blood in stools or black or stools. no Nausea vomiting.  Review of Systems  Constitutional:  Positive for malaise/fatigue. Negative for chills, diaphoresis and fever.  HENT:  Negative for nosebleeds and sore throat.   Eyes:  Negative for double vision.  Respiratory:  Negative for cough, hemoptysis, sputum production, shortness of breath and wheezing.   Cardiovascular:  Negative for chest pain, palpitations, orthopnea and leg swelling.  Gastrointestinal:  Negative for abdominal pain, blood in stool, constipation, diarrhea, heartburn, melena, nausea and vomiting.  Genitourinary:  Negative for dysuria, frequency and urgency.  Musculoskeletal:  Positive for back pain and joint  pain.  Skin: Negative.  Negative for itching and rash.  Neurological:  Negative for dizziness, tingling, focal weakness, weakness and headaches.  Endo/Heme/Allergies:  Does not bruise/bleed easily.  Psychiatric/Behavioral:  Negative for depression. The patient is not nervous/anxious and does not have insomnia.     MEDICAL HISTORY:  Past Medical History:  Diagnosis Date   Abnormal pap    Anemia    Anxiety    History of lupus 1987   Hypertension    Low vitamin D level    Renal insufficiency 1988   kidney biopsy 1988 and 1998- "my kidneys function at like 48%"    SURGICAL HISTORY: Past Surgical History:  Procedure Laterality Date   BONE MARROW BIOPSY  01/2021   BREAST BIOPSY Right 07/08/2017   BREAST EXCISIONAL BIOPSY Right 08/06/2017   BREAST EXCISIONAL BIOPSY Right 07/1999   BREAST LUMPECTOMY WITH RADIOACTIVE SEED LOCALIZATION Right 08/06/2017   Procedure: RIGHT BREAST LUMPECTOMY WITH RADIOACTIVE SEED LOCALIZATION ERAS PATHWAY;  Surgeon: Excell Seltzer, MD;  Location: [place];  Service: General;  Laterality: Right;   BREAST LUMPECTOMY WITH RADIOACTIVE SEED LOCALIZATION Right 08/19/2018   Procedure: RIGHT BREAST LUMPECTOMY [place];  Surgeon: Excell Seltzer, MD;  Location: [place];  Service: General;  Laterality: Right;   BREAST SURGERY  07/1999   right breast benign fibroadenoma   COLONOSCOPY  2012   COLONOSCOPY WITH PROPOFOL N/A 05/01/2017   Procedure: COLONOSCOPY WITH PROPOFOL;  Surgeon: Christene Lye, MD;  Location: [facility];  Service: Endoscopy;  Laterality: N/A;   CRYOTHERAPY     abnormal pap smear   ESOPHAGOGASTRODUODENOSCOPY (EGD) WITH PROPOFOL N/A 05/01/2017   Procedure: ESOPHAGOGASTRODUODENOSCOPY (EGD) WITH PROPOFOL;  Surgeon: Christene Lye, MD;  Location: [facility] END[facility];  Service: Endoscopy;  Laterality: N/A;  NASAL SEPTUM SURGERY  2007   TUBAL LIGATION  1990    SOCIAL  HISTORY: Social History   Socioeconomic History   Marital status: Widowed    Spouse name: Not on file   Number of children: Not on file   Years of education: Not on file   Highest education level: Not on file  Occupational History   Not on file  Tobacco Use   Smoking status: Never   Smokeless tobacco: Never  Vaping Use   Vaping Use: Never used  Substance and Sexual Activity   Alcohol use: Yes    Alcohol/week: 4.0 - 6.0 standard drinks    Types: 4 - 6 Standard drinks or equivalent per week    Comment: few beers on friday and saturday   Drug use: Yes    Types: Marijuana    Comment: few times a month    Sexual activity: Yes    Partners: Male    Birth control/protection: Surgical, Post-menopausal    Comment: BTL  Other Topics Concern   Not on file  Social History Narrative   Husband died 2016/12/07 from lung cancer at age 28 yrs old.     Social Determinants of Health   Financial Resource Strain: Not on file  Food Insecurity: Not on file  Transportation Needs: Not on file  Physical Activity: Not on file  Stress: Not on file  Social Connections: Not on file  Intimate Partner Violence: Not on file    FAMILY HISTORY: Family History  Problem Relation Age of Onset   Cancer Father        oral cancer   Cancer Maternal Grandmother        ovarian cancer   Hypertension Maternal Grandmother    Diabetes Maternal Grandfather    Diabetes Paternal Aunt    Breast cancer Neg Hx     ALLERGIES:  is allergic to elemental sulfur and sulfa antibiotics.  MEDICATIONS:  Current Outpatient Medications  Medication Sig Dispense Refill   aspirin 81 MG tablet Take 81 mg by mouth daily.     chlorthalidone (HYGROTON) 25 MG tablet Take 12.5 mg by mouth in the morning.     Cholecalciferol (VITAMIN D3 PO) Take 1 tablet by mouth daily.      cyanocobalamin 1000 MCG tablet Take 1,000 mcg by mouth daily. Vitamin B12     enalapril (VASOTEC) 20 MG tablet Take 20 mg by mouth 2 (two) times daily.      Ferrous Sulfate (IRON) 325 (65 Fe) MG TABS Take by mouth.     hydrochlorothiazide (HYDRODIURIL) 12.5 MG tablet Take 12.5 mg by mouth daily.     hydroxychloroquine (PLAQUENIL) 200 MG tablet Take 200 mg by mouth every other day.      traMADol (ULTRAM) 50 MG tablet Take 50 mg by mouth every 6 (six) hours as needed for moderate pain.     traZODone (DESYREL) 50 MG tablet Take 1 tablet (50 mg total) by mouth at bedtime as needed for sleep. 90 tablet 0   omeprazole (PRILOSEC) 20 MG capsule Take 20 mg by mouth daily. (Patient not taking: Reported on 09/27/2021)     No current facility-administered medications for this visit.      Marland Kitchen  PHYSICAL EXAMINATION: ECOG PERFORMANCE STATUS: 1 - Symptomatic but completely ambulatory  Vitals:   09/27/21 0957  BP: 122/68  Pulse: 77  Resp: 16  Temp: 98.1 F (36.7 C)  SpO2: 99%   Filed Weights   09/27/21 0957  Weight: 179 lb (  81.2 kg)    Physical Exam Vitals and nursing note reviewed.  Constitutional:      Comments:     HENT:     Head: Normocephalic and atraumatic.     Mouth/Throat:     Mouth: Mucous membranes are moist.     Pharynx: No oropharyngeal exudate.  Eyes:     Pupils: Pupils are equal, round, and reactive to light.  Cardiovascular:     Rate and Rhythm: Normal rate and regular rhythm.  Pulmonary:     Effort: No respiratory distress.     Breath sounds: No wheezing.     Comments: Decreased breath sounds bilaterally at bases.  No wheeze or crackles Abdominal:     General: Bowel sounds are normal. There is no distension.     Palpations: Abdomen is soft. There is no mass.     Tenderness: There is no abdominal tenderness. There is no guarding or rebound.  Musculoskeletal:        General: No tenderness. Normal range of motion.     Cervical back: Normal range of motion and neck supple.  Skin:    General: Skin is warm.  Neurological:     Mental Status: She is alert and oriented to person, place, and time.  Psychiatric:         Mood and Affect: Affect normal.        Judgment: Judgment normal.    LABORATORY DATA:  I have reviewed the data as listed Lab Results  Component Value Date   WBC 9.3 09/25/2021   HGB 10.3 (L) 09/25/2021   HCT 30.6 (L) 09/25/2021   MCV 97.8 09/25/2021   PLT 494 (H) 09/25/2021   Recent Labs    12/08/20 1505 04/06/21 1153 06/12/21 0954 09/25/21 1425  NA 141 136 134* 136  K 4.3 4.8 5.3* 4.4  CL 106 103 101 103  CO2 _0 GLUCOSE 81 94 82 89  BUN 23 29* 29* 30*  CREATININE 1.11* 1.28* 1.46* 1.21*  CALCIUM 9.2 9.4 9.0 9.0  GFRNONAA 55* 46* 39* 49*  PROT 7.2 7.6 7.3  --   ALBUMIN 4.0 4.3 4.0  --   AST _1 --   ALT _2 --   ALKPHOS 66 75 71  --   BILITOT 0.7 0.7 0.7  --     RADIOGRAPHIC STUDIES: I have personally reviewed the radiological images as listed and agreed with the findings in the report. DG Chest 2 View  Result Date: 09/06/2021 CLINICAL DATA:  Chronic sinuitis since 07/22/2021. Shortness of breath on exertion and cough. EXAM: CHEST - 2 VIEW COMPARISON:  None. FINDINGS: The heart size and mediastinal contours are within normal limits. Both lungs are clear. The visualized skeletal structures are unremarkable. IMPRESSION: No active cardiopulmonary disease. Electronically Signed   By: Kathreen Devoid M.D.   On: 09/06/2021 08:33    ASSESSMENT & PLAN:   Symptomatic anemia #Anemia/CKD-III ] August 2022- Hemoglobin 10.7 [AUG 2022] iron saturation 22% ferritin-39-patient is symptomatic from fatigued; but improved. Continue PO iron.  Discussed regarding IV iron.  Patient wants to hold off.  # #B12 deficiency [MAY 2022- b12-126]August 2022 B12 level 460; proceed with B12 injection q 47M;   Continue B12 oral at home.   #  Chronic thrombocytosis and monocytosis [since 2012].   myeloproliferative disorder such as CMML without current need for treatment.  She has a mild normocytic anemia. April 2022-bone marrow biopsy-no acute process.  #  CKD-III [Dr.Murphy,  nephrology]-lupus versus others defer to nephrology.  Overall stable.  significant abnormalities.  # Anxiety- recommend evaluation with psychiatry.  We will make a referral to psychiatry.  # DISPOSITION: # refer to Psychiatry re: anxiety # B12 injection today # follow up in 3 months- B12 injection # follow up in 6 months- MD; 2-3 days prior-labs-cbc/bmp/ iron studies/ferritin; LDH-B12 levels.  Possible B12 injection-Dr.B  All questions were answered. The patient knows to call the clinic with any problems, questions or concerns.    Cammie Sickle, MD 09/27/2021 1:20 PM

## 2021-09-27 NOTE — Progress Notes (Signed)
Pt in for follow up states had a sinus infection which she just finished 21 day course of doxycycline.  Pt also states her disappointment that no one will prescribe xanax.  RN suggested pt speak with PCP regarding concern.  Pt verbalized understanding.

## 2021-09-28 ENCOUNTER — Ambulatory Visit: Payer: Medicare Other

## 2021-10-02 ENCOUNTER — Other Ambulatory Visit: Payer: Self-pay

## 2021-10-02 ENCOUNTER — Ambulatory Visit (INDEPENDENT_AMBULATORY_CARE_PROVIDER_SITE_OTHER): Payer: Medicare Other

## 2021-10-02 DIAGNOSIS — Z23 Encounter for immunization: Secondary | ICD-10-CM

## 2021-10-04 ENCOUNTER — Ambulatory Visit: Payer: Medicare Other | Admitting: Dietician

## 2021-10-19 DIAGNOSIS — Z20822 Contact with and (suspected) exposure to covid-19: Secondary | ICD-10-CM | POA: Diagnosis not present

## 2021-10-25 ENCOUNTER — Encounter: Payer: Self-pay | Admitting: Dietician

## 2021-10-25 NOTE — Progress Notes (Signed)
Have not heard back from patient to reschedule her missed appointment from 09/21/21 (rescheduled from 09/20/21). Sent notification to referring provider.

## 2021-11-02 ENCOUNTER — Encounter: Payer: Self-pay | Admitting: Dietician

## 2021-11-02 ENCOUNTER — Encounter: Payer: Medicare Other | Attending: Obstetrics and Gynecology | Admitting: Dietician

## 2021-11-02 ENCOUNTER — Other Ambulatory Visit: Payer: Self-pay

## 2021-11-02 VITALS — Ht 66.0 in | Wt 179.7 lb

## 2021-11-02 DIAGNOSIS — E663 Overweight: Secondary | ICD-10-CM

## 2021-11-02 DIAGNOSIS — N183 Chronic kidney disease, stage 3 unspecified: Secondary | ICD-10-CM

## 2021-11-02 DIAGNOSIS — R5383 Other fatigue: Secondary | ICD-10-CM | POA: Diagnosis not present

## 2021-11-02 DIAGNOSIS — Z6829 Body mass index (BMI) 29.0-29.9, adult: Secondary | ICD-10-CM | POA: Insufficient documentation

## 2021-11-02 DIAGNOSIS — N189 Chronic kidney disease, unspecified: Secondary | ICD-10-CM | POA: Diagnosis not present

## 2021-11-02 DIAGNOSIS — R635 Abnormal weight gain: Secondary | ICD-10-CM | POA: Diagnosis present

## 2021-11-02 NOTE — Patient Instructions (Signed)
Continue to control portions of meats and starchy foods, and include a vegetable and/or fruit with each meal.  Limit sodium in foods by avoiding added salt, controlling food portions, and look for low sodium products at the grocery store. Gradually build up some exercise, especially on days off.

## 2021-11-02 NOTE — Progress Notes (Signed)
Medical Nutrition Therapy: Visit start time: 0165  end time: 1345  Assessment:  Diagnosis: CKD Stage 3, abnormal weight gain Medical history changes: seasonal allergy symptoms, sinusitis 08/2021 Psychosocial issues/ stress concerns: none  Current weight: 179.7lbs Height: 5'6" BMI: 29 Medications, supplement changes: no changes per patient  Progress and evaluation:  Patient reports eating smaller amounts than in the past, in part due to decreased appetite from several bouts of respiratory illness +/or allergies. Occasionally has episodes of urgent BMs/ loose stools about 30-60 minutes after eating, related to IBS. Avoids corn due to diverticulosis; sometimes raw veg like cucumber and tomato cause GI upset. An aunt became ill and passed away in 22-Oct-2023; and Ms. Dobosz was spending time with her and subsequently eating more restaurant meals.   Physical activity: on the job walking, some lifting. Some stretches, calisthenics at home, No other exercise at this time.   Dietary Intake:  Usual eating pattern includes 3 meals and 0-2 snacks per day. Dining out frequency: ? meals per week.  Breakfast: yogurt; banana ; fast food biscuit, usually 2c coffee Snack: none Lunch: salad as meal; 1/2 sandwich ie chicken salad on whole wheat + apple or applesauce Snack: occasionally cheese stick; some snacks when watching football (Sundays), avoids eating from large containers. Supper: grilled chicken + green beans and/or peas;1/11 meat loaf and potatoes/baked sweet potato; [place] restaurant meal --enchiladas; usually eats 1/2 of restaurant meal, takes leftovers home. Snack: none Beverages: water, sweet tea  Nutrition Care Education: Topics covered:  Basic nutrition:  appropriate nutrient balance, appropriate meal and snack schedule    Weight control: reviewed progress since previous visit, discussed portion control  Advanced nutrition:  dining out Renal disease: reviewed importance of limiting sodium  and encouraged label reading, limiting/ avoiding use of added salt, and ongoing practice of small portions at restaurants and/or asking for unsalted food; reviewed importance of controlling portions of protein foods; discussed high potassium foods and limiting large portions of potatoes, dark leafy greens, citrus fruits, and melons.   Nutritional Diagnosis:  [place]-2.2 Altered nutrition-related laboratory As related to Stage 3 kidney disease.  As evidenced by low GFR, elevated potassium. [place]-3.3 Overweight/obesity As related to frequent restaurant meals with history of excess calories.  As evidenced by patient with current BMI of 29, losing weight gradually.  Intervention:  Instruction and discussion as noted above. Patient reports willingness to continue efforts at dietary improvements. Updated nutrition goals with input from patient. No additional MNT follow up scheduled at this time; patient to schedule later as needed.  Education Materials given:  Visit summary with goals/ instructions   Learner/ who was taught:  Patient    Level of understanding: Verbalizes/ demonstrates competency   Demonstrated degree of understanding via:   Teach back Learning barriers: None  Willingness to learn/ readiness for change: Eager, change in progress   Monitoring and Evaluation:  Dietary intake, exercise, renal function, and body weight      follow up: prn

## 2021-11-06 ENCOUNTER — Ambulatory Visit (INDEPENDENT_AMBULATORY_CARE_PROVIDER_SITE_OTHER): Payer: Medicare Other | Admitting: *Deleted

## 2021-11-06 DIAGNOSIS — Z1211 Encounter for screening for malignant neoplasm of colon: Secondary | ICD-10-CM | POA: Diagnosis not present

## 2021-11-06 DIAGNOSIS — Z0000 Encounter for general adult medical examination without abnormal findings: Secondary | ICD-10-CM

## 2021-11-06 DIAGNOSIS — Z780 Asymptomatic menopausal state: Secondary | ICD-10-CM

## 2021-11-06 NOTE — Progress Notes (Signed)
Subjective:   [name] is a 66 y.o. female who presents for Medicare Annual (Subsequent) preventive examination.  I connected with  [name] on 11/06/21 by a telephone enabled telemedicine application and verified that I am speaking with the correct person using two identifiers.   I discussed the limitations of evaluation and management by telemedicine. The patient expressed understanding and agreed to proceed.  Patient location: home  Provider location:  Tele-Health  not in office    Review of Systems     Cardiac Risk Factors include: advanced age (>7mn, >>53women);hypertension     Objective:    Today's Vitals   There is no height or weight on file to calculate BMI.  Advanced Directives 11/06/2021 07/20/2021 06/12/2021 02/16/2021 12/12/2020 12/14/2019 12/08/2018  Does Patient Have a Medical Advance Directive? _0  No No  Does patient want to make changes to medical advance directive? - - No - Patient declined - - - -  Would patient like information on creating a medical advance directive? No - Patient declined Yes (MAU/Ambulatory/Procedural Areas - Information given) - - No - Patient declined No - Patient declined No - Patient declined    Current Medications (verified) Outpatient Encounter Medications as of 11/06/2021  Medication Sig   aspirin 81 MG tablet Take 81 mg by mouth daily.   chlorthalidone (HYGROTON) 25 MG tablet Take 12.5 mg by mouth in the morning.   Cholecalciferol (VITAMIN D3 PO) Take 1 tablet by mouth daily.    cyanocobalamin 1000 MCG tablet Take 1,000 mcg by mouth daily. Vitamin B12   enalapril (VASOTEC) 20 MG tablet Take 20 mg by mouth 2 (two) times daily.   Ferrous Sulfate (IRON) 325 (65 Fe) MG TABS Take by mouth.   hydrochlorothiazide (HYDRODIURIL) 12.5 MG tablet Take 12.5 mg by mouth daily.   hydroxychloroquine (PLAQUENIL) 200 MG tablet Take 200 mg by mouth every other day.    omeprazole (PRILOSEC) 20 MG capsule Take 20 mg by  mouth daily.   traMADol (ULTRAM) 50 MG tablet Take 50 mg by mouth every 6 (six) hours as needed for moderate pain.   traZODone (DESYREL) 50 MG tablet Take 1 tablet (50 mg total) by mouth at bedtime as needed for sleep.   No facility-administered encounter medications on file as of 11/06/2021.    Allergies (verified) Elemental sulfur and Sulfa antibiotics   History: Past Medical History:  Diagnosis Date   Abnormal pap    Anemia    Anxiety    History of lupus 1987   Hypertension    Low vitamin D level    Renal insufficiency 1988   kidney biopsy 1988 and 1998- "my kidneys function at like 48%"   Past Surgical History:  Procedure Laterality Date   BONE MARROW BIOPSY  01/2021   BREAST BIOPSY Right 07/08/2017   BREAST EXCISIONAL BIOPSY Right 08/06/2017   BREAST EXCISIONAL BIOPSY Right 07/1999   BREAST LUMPECTOMY WITH RADIOACTIVE SEED LOCALIZATION Right 08/06/2017   Procedure: RIGHT BREAST LUMPECTOMY WITH RADIOACTIVE SEED LOCALIZATION ERAS PATHWAY;  Surgeon: HExcell Seltzer MD;  [place] [facility]  Service: General;  Laterality: Right;   BREAST LUMPECTOMY WITH RADIOACTIVE SEED LOCALIZATION Right 08/19/2018   Procedure: RIGHT BREAST LUMPECTOMY [facility]  Surgeon: HExcell Seltzer MD;  [facility]  Service: General;  Laterality: Right;   BREAST SURGERY  07/1999   right breast benign fibroadenoma   COLONOSCOPY  2012   COLONOSCOPY WITH  PROPOFOL N/A 05/01/2017   Procedure: COLONOSCOPY WITH PROPOFOL;  Surgeon: Christene Lye, MD;  Location: [facility];  Service: Endoscopy;  Laterality: N/A;   CRYOTHERAPY     abnormal pap smear   ESOPHAGOGASTRODUODENOSCOPY (EGD) WITH PROPOFOL N/A 05/01/2017   Procedure: ESOPHAGOGASTRODUODENOSCOPY (EGD) WITH PROPOFOL;  Surgeon: Christene Lye, MD;  Location: [facility];  Service: Endoscopy;  Laterality: N/A;   NASAL SEPTUM SURGERY  Dec 01, 2005   TUBAL LIGATION  1990    Family History  Problem Relation Age of Onset   Cancer Father        oral cancer   Cancer Maternal Grandmother        ovarian cancer   Hypertension Maternal Grandmother    Diabetes Maternal Grandfather    Diabetes Paternal Aunt    Breast cancer Neg Hx    Social History   Socioeconomic History   Marital status: Widowed    Spouse name: Not on file   Number of children: Not on file   Years of education: Not on file   Highest education level: Not on file  Occupational History   Not on file  Tobacco Use   Smoking status: Never   Smokeless tobacco: Never  Vaping Use   Vaping Use: Never used  Substance and Sexual Activity   Alcohol use: Yes    Alcohol/week: 4.0 - 6.0 standard drinks    Types: 4 - 6 Standard drinks or equivalent per week    Comment: few beers on friday and saturday   Drug use: Yes    Types: Marijuana    Comment: few times a month    Sexual activity: Yes    Partners: Male    Birth control/protection: Surgical, Post-menopausal    Comment: BTL  Other Topics Concern   Not on file  Social History Narrative   Husband died 12/01/16 from lung cancer at age 66 yrs old.     Social Determinants of Health   Financial Resource Strain: Low Risk    Difficulty of Paying Living Expenses: Not very hard  Food Insecurity: No Food Insecurity   Worried About Charity fundraiser in the Last Year: Never true   Ran Out of Food in the Last Year: Never true  Transportation Needs: Not on file  Physical Activity: Not on file  Stress: No Stress Concern Present   Feeling of Stress : Only a little  Social Connections: Moderately Integrated   Frequency of Communication with Friends and Family: Three times a week   Frequency of Social Gatherings with Friends and Family: More than three times a week   Attends Religious Services: More than 4 times per year   Active Member of Clubs or Organizations: Yes   Attends Archivist Meetings: 1 to 4 times per year   Marital  Status: Widowed    Tobacco Counseling Counseling given: Not Answered   Clinical Intake:  Pre-visit preparation completed: Yes  Pain : No/denies pain     Nutritional Risks: None Diabetes: No  How often do you need to have someone help you when you read instructions, pamphlets, or other written materials from your doctor or pharmacy?: 1 - Never  Diabetic?  no  Interpreter Needed?: No  Information entered by :: Leroy Kennedy LPN   Activities of Daily Living In your present state of health, do you have any difficulty performing the following activities: 11/06/2021 02/16/2021  Hearing? N N  Vision? N N  Difficulty concentrating or making decisions? N N  or climbing stairs? N N  °Dressing or bathing? N N  °Doing errands, shopping? N -  °Preparing Food and eating ? N -  °Using the Toilet? N -  °In the past six months, have you accidently leaked urine? N -  °Do you have problems with loss of bowel control? N -  °Managing your Medications? N -  °Managing your Finances? N -  °Housekeeping or managing your Housekeeping? N -  °Some recent data might be hidden  ° ° °Patient Care Team: °Holdsworth, [name], NP as PCP - General °Grubb, Patricia, FNP as Nurse Practitioner (Family Medicine) °Sankar, Seeplaputhur G, MD (General Surgery) °Kernodle, George W Jr., MD (Rheumatology) °Amundson C Silva, Brook E, MD as Consulting Physician (Obstetrics and Gynecology) °Brahmanday, Govinda R, MD as Consulting Physician (Hematology and Oncology) ° °Indicate any recent Medical Services you may have received from other than [facility] providers in the past year (date may be approximate). ° °   °Assessment:  ° This is a routine wellness examination for Arva. ° °Hearing/Vision screen °Hearing Screening - Comments:: No trouble hearing °Vision Screening - Comments:: Every 6 months °Dr. Nice ° °Dietary issues and exercise activities discussed: °Current Exercise Habits: Home exercise routine, Type of exercise:  stretching;walking, Time (Minutes): 30, Frequency (Times/Week): 3, Weekly Exercise (Minutes/Week): 90, Intensity: Mild ° ° Goals Addressed   ° °  °  °  °  ° This Visit's Progress  °  Patient Stated     °  Patient would like to start eating healthier.   Would like to start going to gym work on toning °  ° °  ° °Depression Screen °PHQ 2/9 Scores 11/06/2021 09/05/2021 07/20/2021 06/18/2018  °PHQ - 2 Score 1 4 1 2  °PHQ- 9 Score - 15 - 6  °  °Fall Risk °Fall Risk  11/06/2021 09/05/2021 07/20/2021 07/11/2021  °Falls in the past year? 0 1 1 1  °Comment - - missed last step on porch -  °Number falls in past yr: 0 1 1 0  °Injury with Fall? 0 0 0 0  °Risk for fall due to : - Impaired balance/gait History of fall(s) -  °Follow up Falls evaluation completed;Falls prevention discussed Falls evaluation completed Falls evaluation completed -  ° ° °FALL RISK PREVENTION PERTAINING TO THE HOME: ° °Any stairs in or around the home? Yes  °If so, are there any without handrails? No  °Home free of loose throw rugs in walkways, pet beds, electrical cords, etc? Yes  °Adequate lighting in your home to reduce risk of falls? Yes  ° °ASSISTIVE DEVICES UTILIZED TO PREVENT FALLS: ° °Life alert? No  °Use of a cane, walker or w/c? No  °Grab bars in the bathroom? No  °Shower chair or bench in shower? No  °Elevated toilet seat or a handicapped toilet? No  ° °TIMED UP AND GO: ° °Was the test performed? No .  ° ° °Cognitive Function: ° °Normal cognitive status assessed by direct observation by this Nurse Health Advisor. No abnormalities found.    °  °  ° °Immunizations °Immunization History  °Administered Date(s) Administered  ° Fluad Quad(high Dose 65+) 10/02/2021  ° Influenza,inj,Quad PF,6+ Mos 09/07/2016, 07/21/2019  ° Influenza-Unspecified 09/07/2016  ° PFIZER(Purple Top)SARS-COV-2 Vaccination 01/15/2020, 02/10/2020  ° Tdap 03/27/2018  ° ° °TDAP status: Due, Education has been provided regarding the importance of this vaccine. Advised may receive this  vaccine at local pharmacy or Health Dept. Aware to provide a copy of the vaccination record   if obtained from local pharmacy or Health Dept. Verbalized acceptance and understanding. ° °Flu Vaccine status: Up to date ° °Pneumococcal vaccine status: Due, Education has been provided regarding the importance of this vaccine. Advised may receive this vaccine at local pharmacy or Health Dept. Aware to provide a copy of the vaccination record if obtained from local pharmacy or Health Dept. Verbalized acceptance and understanding. ° °Covid-19 vaccine status: Information provided on how to obtain vaccines.  ° °Qualifies for Shingles Vaccine? No   °Zostavax completed No   °Shingrix Completed?: No.    Education has been provided regarding the importance of this vaccine. Patient has been advised to call insurance company to determine out of pocket expense if they have not yet received this vaccine. Advised may also receive vaccine at local pharmacy or Health Dept. Verbalized acceptance and understanding. ° °Screening Tests °Health Maintenance  °Topic Date Due  ° Pneumonia Vaccine 65+ Years old (1 - PCV) Never done  ° Zoster Vaccines- Shingrix (1 of 2) Never done  ° COVID-19 Vaccine (3 - Pfizer risk series) 03/09/2020  ° MAMMOGRAM  07/10/2022  ° COLONOSCOPY (Pts 45-49yrs Insurance coverage will need to be confirmed)  05/02/2027  ° TETANUS/TDAP  03/27/2028  ° INFLUENZA VACCINE  Completed  ° DEXA SCAN  Completed  ° Hepatitis C Screening  Completed  ° HPV VACCINES  Aged Out  ° ° °Health Maintenance ° °Health Maintenance Due  °Topic Date Due  ° Pneumonia Vaccine 65+ Years old (1 - PCV) Never done  ° Zoster Vaccines- Shingrix (1 of 2) Never done  ° COVID-19 Vaccine (3 - Pfizer risk series) 03/09/2020  ° ° °Colorectal cancer screening: Type of screening: Colonoscopy. Completed 2018. Repeat every 5 years ° °Mammogram status: Ordered  . Pt provided with contact info and advised to call to schedule appt.  ° °Bone Density status: Ordered   . Pt provided with contact info and advised to call to schedule appt. ° °Lung Cancer Screening: (Low Dose CT Chest recommended if Age 55-80 years, 30 pack-year currently smoking OR have quit w/in 15years.) does not qualify.  ° °Lung Cancer Screening Referral:  ° °Additional Screening: ° °Hepatitis C Screening: does not qualify; Completed 2017 ° °Vision Screening: Recommended annual ophthalmology exams for early detection of glaucoma and other disorders of the eye. °Is the patient up to date with their annual eye exam?  Yes  °Who is the provider or what is the name of the office in which the patient attends annual eye exams? Dr. Nice °If pt is not established with a provider, would they like to be referred to a provider to establish care? No .  ° °Dental Screening: Recommended annual dental exams for proper oral hygiene ° °Community Resource Referral / Chronic Care Management: °CRR required this visit?  No  ° °CCM required this visit?  No  ° ° °  °Plan:  °  ° °I have personally reviewed and noted the following in the patient’s chart:  ° °Medical and social history °Use of alcohol, tobacco or illicit drugs  °Current medications and supplements including opioid prescriptions.  °Functional ability and status °Nutritional status °Physical activity °Advanced directives °List of other physicians °Hospitalizations, surgeries, and ER visits in previous 12 months °Vitals °Screenings to include cognitive, depression, and falls °Referrals and appointments ° °In addition, I have reviewed and discussed with patient certain preventive protocols, quality metrics, and best practice recommendations. A written personalized care plan for preventive services as well as general preventive health   recommendations were provided to patient. °  ° ° °Julie Greer, LPN   11/06/2021  ° °Nurse Notes:  ° ° ° ° ° °

## 2021-11-06 NOTE — Patient Instructions (Signed)
[name] , Thank you for taking time to come for your Medicare Wellness Visit. I appreciate your ongoing commitment to your health goals. Please review the following plan we discussed and let me know if I can assist you in the future.   Screening recommendations/referrals: Colonoscopy: Education provided Mammogram: up to date Bone Density: Eduction provided Recommended yearly ophthalmology/optometry visit for glaucoma screening and checkup Recommended yearly dental visit for hygiene and checkup  Vaccinations: Influenza vaccine: up to date Pneumococcal vaccine: Education provided Tdap vaccine: up to date Shingles vaccine: Education provided    Advanced directives: Education provided  Conditions/risks identified:   Preventive Care 46 Years and Older, Female Preventive care refers to lifestyle choices and visits with your health care provider that can promote health and wellness. What does preventive care include? A yearly physical exam. This is also called an annual well check. Dental exams once or twice a year. Routine eye exams. Ask your health care provider how often you should have your eyes checked. Personal lifestyle choices, including: Daily care of your teeth and gums. Regular physical activity. Eating a healthy diet. Avoiding tobacco and drug use. Limiting alcohol use. Practicing safe sex. Taking low-dose aspirin every day. Taking vitamin and mineral supplements as recommended by your health care provider. What happens during an annual well check? The services and screenings done by your health care provider during your annual well check will depend on your age, overall health, lifestyle risk factors, and family history of disease. Counseling  Your health care provider may ask you questions about your: Alcohol use. Tobacco use. Drug use. Emotional well-being. Home and relationship well-being. Sexual activity. Eating habits. History of falls. Memory and ability  to understand (cognition). Work and work Statistician. Reproductive health. Screening  You may have the following tests or measurements: Height, weight, and BMI. Blood pressure. Lipid and cholesterol levels. These may be checked every 5 years, or more frequently if you are over 63 years old. Skin check. Lung cancer screening. You may have this screening every year starting at age 77 if you have a 30-pack-year history of smoking and currently smoke or have quit within the past 15 years. Fecal occult blood test (FOBT) of the stool. You may have this test every year starting at age 40. Flexible sigmoidoscopy or colonoscopy. You may have a sigmoidoscopy every 5 years or a colonoscopy every 10 years starting at age 45. Hepatitis C blood test. Hepatitis B blood test. Sexually transmitted disease (STD) testing. Diabetes screening. This is done by checking your blood sugar (glucose) after you have not eaten for a while (fasting). You may have this done every 1-3 years. Bone density scan. This is done to screen for osteoporosis. You may have this done starting at age 34. Mammogram. This may be done every 1-2 years. Talk to your health care provider about how often you should have regular mammograms. Talk with your health care provider about your test results, treatment options, and if necessary, the need for more tests. Vaccines  Your health care provider may recommend certain vaccines, such as: Influenza vaccine. This is recommended every year. Tetanus, diphtheria, and acellular pertussis (Tdap, Td) vaccine. You may need a Td booster every 10 years. Zoster vaccine. You may need this after age 67. Pneumococcal 13-valent conjugate (PCV13) vaccine. One dose is recommended after age 67. Pneumococcal polysaccharide (PPSV23) vaccine. One dose is recommended after age 67. Talk to your health care provider about which screenings and vaccines you need and how often you  need them. This information is not  intended to replace advice given to you by your health care provider. Make sure you discuss any questions you have with your health care provider. Document Released: 11/04/2015 Document Revised: 06/27/2016 Document Reviewed: 08/09/2015 Elsevier Interactive Patient Education  2017 [employer] Prevention in the Home Falls can cause injuries. They can happen to people of all ages. There are many things you can do to make your home safe and to help prevent falls. What can I do on the outside of my home? Regularly fix the edges of walkways and driveways and fix any cracks. Remove anything that might make you trip as you walk through a door, such as a raised step or threshold. Trim any bushes or trees on the path to your home. Use bright outdoor lighting. Clear any walking paths of anything that might make someone trip, such as rocks or tools. Regularly check to see if handrails are loose or broken. Make sure that both sides of any steps have handrails. Any raised decks and porches should have guardrails on the edges. Have any leaves, snow, or ice cleared regularly. Use sand or salt on walking paths during winter. Clean up any spills in your garage right away. This includes oil or grease spills. What can I do in the bathroom? Use night lights. Install grab bars by the toilet and in the tub and shower. Do not use towel bars as grab bars. Use non-skid mats or decals in the tub or shower. If you need to sit down in the shower, use a plastic, non-slip stool. Keep the floor dry. Clean up any water that spills on the floor as soon as it happens. Remove soap buildup in the tub or shower regularly. Attach bath mats securely with double-sided non-slip rug tape. Do not have throw rugs and other things on the floor that can make you trip. What can I do in the bedroom? Use night lights. Make sure that you have a light by your bed that is easy to reach. Do not use any sheets or blankets that are  too big for your bed. They should not hang down onto the floor. Have a firm chair that has side arms. You can use this for support while you get dressed. Do not have throw rugs and other things on the floor that can make you trip. What can I do in the kitchen? Clean up any spills right away. Avoid walking on wet floors. Keep items that you use a lot in easy-to-reach places. If you need to reach something above you, use a strong step stool that has a grab bar. Keep electrical cords out of the way. Do not use floor polish or wax that makes floors slippery. If you must use wax, use non-skid floor wax. Do not have throw rugs and other things on the floor that can make you trip. What can I do with my stairs? Do not leave any items on the stairs. Make sure that there are handrails on both sides of the stairs and use them. Fix handrails that are broken or loose. Make sure that handrails are as long as the stairways. Check any carpeting to make sure that it is firmly attached to the stairs. Fix any carpet that is loose or worn. Avoid having throw rugs at the top or bottom of the stairs. If you do have throw rugs, attach them to the floor with carpet tape. Make sure that you have a light switch  at the top of the stairs and the bottom of the stairs. If you do not have them, ask someone to add them for you. What else can I do to help prevent falls? Wear shoes that: Do not have high heels. Have rubber bottoms. Are comfortable and fit you well. Are closed at the toe. Do not wear sandals. If you use a stepladder: Make sure that it is fully opened. Do not climb a closed stepladder. Make sure that both sides of the stepladder are locked into place. Ask someone to hold it for you, if possible. Clearly mark and make sure that you can see: Any grab bars or handrails. First and last steps. Where the edge of each step is. Use tools that help you move around (mobility aids) if they are needed. These  include: Canes. Walkers. Scooters. Crutches. Turn on the lights when you go into a dark area. Replace any light bulbs as soon as they burn out. Set up your furniture so you have a clear path. Avoid moving your furniture around. If any of your floors are uneven, fix them. If there are any pets around you, be aware of where they are. Review your medicines with your doctor. Some medicines can make you feel dizzy. This can increase your chance of falling. Ask your doctor what other things that you can do to help prevent falls. This information is not intended to replace advice given to you by your health care provider. Make sure you discuss any questions you have with your health care provider. Document Released: 08/04/2009 Document Revised: 03/15/2016 Document Reviewed: 11/12/2014 Elsevier Interactive Patient Education  2017 [employer].

## 2021-11-07 DIAGNOSIS — M9901 Segmental and somatic dysfunction of cervical region: Secondary | ICD-10-CM | POA: Diagnosis not present

## 2021-11-07 DIAGNOSIS — G589 Mononeuropathy, unspecified: Secondary | ICD-10-CM | POA: Insufficient documentation

## 2021-11-07 DIAGNOSIS — M542 Cervicalgia: Secondary | ICD-10-CM | POA: Diagnosis not present

## 2021-11-07 DIAGNOSIS — M40292 Other kyphosis, cervical region: Secondary | ICD-10-CM | POA: Diagnosis not present

## 2021-11-07 DIAGNOSIS — M9902 Segmental and somatic dysfunction of thoracic region: Secondary | ICD-10-CM | POA: Diagnosis not present

## 2021-11-08 DIAGNOSIS — M9901 Segmental and somatic dysfunction of cervical region: Secondary | ICD-10-CM | POA: Diagnosis not present

## 2021-11-08 DIAGNOSIS — M40292 Other kyphosis, cervical region: Secondary | ICD-10-CM | POA: Diagnosis not present

## 2021-11-08 DIAGNOSIS — M9902 Segmental and somatic dysfunction of thoracic region: Secondary | ICD-10-CM | POA: Diagnosis not present

## 2021-11-08 DIAGNOSIS — M542 Cervicalgia: Secondary | ICD-10-CM | POA: Diagnosis not present

## 2021-11-10 DIAGNOSIS — M542 Cervicalgia: Secondary | ICD-10-CM | POA: Diagnosis not present

## 2021-11-10 DIAGNOSIS — M40292 Other kyphosis, cervical region: Secondary | ICD-10-CM | POA: Diagnosis not present

## 2021-11-10 DIAGNOSIS — M9901 Segmental and somatic dysfunction of cervical region: Secondary | ICD-10-CM | POA: Diagnosis not present

## 2021-11-10 DIAGNOSIS — M9902 Segmental and somatic dysfunction of thoracic region: Secondary | ICD-10-CM | POA: Diagnosis not present

## 2021-11-13 DIAGNOSIS — M40292 Other kyphosis, cervical region: Secondary | ICD-10-CM | POA: Diagnosis not present

## 2021-11-13 DIAGNOSIS — M542 Cervicalgia: Secondary | ICD-10-CM | POA: Diagnosis not present

## 2021-11-13 DIAGNOSIS — M9902 Segmental and somatic dysfunction of thoracic region: Secondary | ICD-10-CM | POA: Diagnosis not present

## 2021-11-13 DIAGNOSIS — M9901 Segmental and somatic dysfunction of cervical region: Secondary | ICD-10-CM | POA: Diagnosis not present

## 2021-11-14 ENCOUNTER — Other Ambulatory Visit: Payer: Self-pay

## 2021-11-14 ENCOUNTER — Encounter: Payer: Self-pay | Admitting: Gastroenterology

## 2021-11-14 ENCOUNTER — Ambulatory Visit: Payer: Medicare Other | Admitting: Gastroenterology

## 2021-11-14 VITALS — BP 123/81 | HR 76 | Temp 97.9°F | Ht 65.0 in | Wt 182.5 lb

## 2021-11-14 DIAGNOSIS — K2270 Barrett's esophagus without dysplasia: Secondary | ICD-10-CM | POA: Diagnosis not present

## 2021-11-14 DIAGNOSIS — D126 Benign neoplasm of colon, unspecified: Secondary | ICD-10-CM

## 2021-11-14 DIAGNOSIS — Z8719 Personal history of other diseases of the digestive system: Secondary | ICD-10-CM

## 2021-11-14 DIAGNOSIS — K529 Noninfective gastroenteritis and colitis, unspecified: Secondary | ICD-10-CM

## 2021-11-14 DIAGNOSIS — Z86010 Personal history of colonic polyps: Secondary | ICD-10-CM

## 2021-11-14 DIAGNOSIS — R140 Abdominal distension (gaseous): Secondary | ICD-10-CM | POA: Diagnosis not present

## 2021-11-14 MED ORDER — NA SULFATE-K SULFATE-MG SULF 17.5-3.13-1.6 GM/177ML PO SOLN: 354.0000 mL | Freq: Once | ORAL | 0 refills | Status: AC

## 2021-11-14 NOTE — Progress Notes (Signed)
Cephas Darby, MD [address]  [place]  [place],  [address]  Main: [phone]  Fax: [phone]    Gastroenterology Consultation  Referring Provider:     Jon Billings, NP Primary Care Physician:  Jon Billings, NP Primary Gastroenterologist:  Dr. Cephas Darby Reason for Consultation:     Postprandial urgency, diarrhea        HPI:   [name] is a 66 y.o. female referred by Dr. Jon Billings, NP  for consultation & management of chronic symptoms of sporadic episodes of postprandial urgency, nonbloody diarrhea.  She reports that she has been having the symptoms for almost a year.  Patient denies any nausea, vomiting, abdominal pain, weight loss or loss of appetite.  She does state that she makes her coffee "blonde " by adding creamer.  She does consume sweet tea, 2 cups daily.  She does report abdominal bloating.  Patient has history of stage II-III CKD, lupus, anemia of chronic disease.  Patient does not smoke or drink alcohol  NSAIDs: None  Antiplts/Anticoagulants/Anti thrombotics: None  GI Procedures:  EGD and colonoscopy 05/01/2017 - Normal examined duodenum. - Normal stomach. - Esophageal mucosal changes suspicious for short-segment Barrett's esophagus. Biopsied. - The examination was otherwise normal.  - Diverticulosis in the sigmoid colon, in the descending colon and in the transverse colon. - One 5 mm polyp at the recto-sigmoid colon, removed with a hot snare. Resected and retrieved. - The examination was otherwise normal.  DIAGNOSIS:  A. GE JUNCTION; COLD BIOPSY:  - COLUMNAR MUCOSA WITH INTESTINAL METAPLASIA.  - NEGATIVE FOR DYSPLASIA AND MALIGNANCY.   Note: If a characteristic lesion was identified endoscopically within  the GE junction, the histologic findings would fulfill criteria for  Barrett's esophagus.   B. COLON POLYP, RECTOSIGMOID; HOT SNARE:  - TUBULAR ADENOMA.  - NEGATIVE FOR HIGH-GRADE DYSPLASIA AND  MALIGNANCY.  Past Medical History:  Diagnosis Date   Abnormal pap    Anemia    Anxiety    History of lupus 1987   Hypertension    Low vitamin D level    Renal insufficiency 1988   kidney biopsy 1988 and 1998- "my kidneys function at like 48%"    Past Surgical History:  Procedure Laterality Date   BONE MARROW BIOPSY  01/2021   BREAST BIOPSY Right 07/08/2017   BREAST EXCISIONAL BIOPSY Right 08/06/2017   BREAST EXCISIONAL BIOPSY Right 07/1999   BREAST LUMPECTOMY WITH RADIOACTIVE SEED LOCALIZATION Right 08/06/2017   Procedure: RIGHT BREAST LUMPECTOMY WITH RADIOACTIVE SEED LOCALIZATION [facility];  Surgeon: Excell Seltzer, MD;  Location: [facility];  Service: General;  Laterality: Right;   BREAST LUMPECTOMY WITH RADIOACTIVE SEED LOCALIZATION Right 08/19/2018   Procedure: RIGHT BREAST LUMPECTOMY [place];  Surgeon: Excell Seltzer, MD;  Location: [facility];  Service: General;  Laterality: Right;   BREAST SURGERY  07/1999   right breast benign fibroadenoma   COLONOSCOPY  2012   COLONOSCOPY WITH PROPOFOL N/A 05/01/2017   Procedure: COLONOSCOPY WITH PROPOFOL;  Surgeon: Christene Lye, MD;  Location: [facility];  Service: Endoscopy;  Laterality: N/A;   CRYOTHERAPY     abnormal pap smear   ESOPHAGOGASTRODUODENOSCOPY (EGD) WITH PROPOFOL N/A 05/01/2017   Procedure: ESOPHAGOGASTRODUODENOSCOPY (EGD) WITH PROPOFOL;  Surgeon: Christene Lye, MD;  Location: [facility];  Service: Endoscopy;  Laterality: N/A;   NASAL SEPTUM SURGERY  2007   TUBAL LIGATION  1990    Current Outpatient Medications:  aspirin 81 MG tablet, Take 81 mg by mouth daily., Disp: , Rfl:    chlorthalidone (HYGROTON) 25 MG tablet, Take 12.5 mg by mouth in the morning., Disp: , Rfl:    Cholecalciferol (VITAMIN D3 PO), Take 1 tablet by mouth daily. , Disp: , Rfl:    cyanocobalamin 1000 MCG tablet, Take 1,000 mcg by mouth daily. Vitamin B12, Disp:  , Rfl:    diazepam (VALIUM) 5 MG tablet, Take by mouth., Disp: , Rfl:    enalapril (VASOTEC) 20 MG tablet, Take 20 mg by mouth 2 (two) times daily., Disp: , Rfl:    Ferrous Sulfate (IRON) 325 (65 Fe) MG TABS, Take by mouth., Disp: , Rfl:    hydrochlorothiazide (HYDRODIURIL) 12.5 MG tablet, Take 12.5 mg by mouth daily., Disp: , Rfl:    hydroxychloroquine (PLAQUENIL) 200 MG tablet, Take 200 mg by mouth every other day. , Disp: , Rfl:    Na Sulfate-K Sulfate-Mg Sulf 17.5-3.13-1.6 GM/177ML SOLN, Take 354 mLs by mouth once for 1 dose., Disp: 354 mL, Rfl: 0   omeprazole (PRILOSEC) 20 MG capsule, Take 20 mg by mouth daily., Disp: , Rfl:    traMADol (ULTRAM) 50 MG tablet, Take 50 mg by mouth every 6 (six) hours as needed for moderate pain., Disp: , Rfl:    traZODone (DESYREL) 50 MG tablet, Take 1 tablet (50 mg total) by mouth at bedtime as needed for sleep., Disp: 90 tablet, Rfl: 0    Family History  Problem Relation Age of Onset   Cancer Father        oral cancer   Cancer Maternal Grandmother        ovarian cancer   Hypertension Maternal Grandmother    Diabetes Maternal Grandfather    Diabetes Paternal Aunt    Breast cancer Neg Hx      Social History   Tobacco Use   Smoking status: Never   Smokeless tobacco: Never  Vaping Use   Vaping Use: Never used  Substance Use Topics   Alcohol use: Yes    Alcohol/week: 4.0 - 6.0 standard drinks    Types: 4 - 6 Standard drinks or equivalent per week    Comment: few beers on friday and saturday   Drug use: Yes    Types: Marijuana    Comment: few times a month     Allergies as of 11/14/2021 - Review Complete 11/14/2021  Allergen Reaction Noted   Elemental sulfur Hives 02/04/2013   Sulfa antibiotics Hives, Itching, and Rash 03/11/2013    Review of Systems:    All systems reviewed and negative except where noted in HPI.   Physical Exam:  BP 123/81 (BP Location: Left Arm, Patient Position: Sitting, Cuff Size: Normal)    Pulse 76    Temp  97.9 F (36.6 C) (Oral)    Ht '5\' 5"'  (1.651 m)    Wt 182 lb 8 oz (82.8 kg)    LMP 08/22/2008    BMI 30.37 kg/m  Patient's last menstrual period was 08/22/2008.  General:   Alert,  Well-developed, well-nourished, pleasant and cooperative in NAD Head:  Normocephalic and atraumatic. Eyes:  Sclera clear, no icterus.   Conjunctiva pink. Ears:  Normal auditory acuity. Nose:  No deformity, discharge, or lesions. Mouth:  No deformity or lesions,oropharynx pink & moist. Neck:  Supple; no masses or thyromegaly. Lungs:  Respirations even and unlabored.  Clear throughout to auscultation.   No wheezes, crackles, or rhonchi. No acute distress. Heart:  Regular rate and rhythm;  no murmurs, clicks, rubs, or gallops. Abdomen:  Normal bowel sounds. Soft, non-tender and non-distended without masses, hepatosplenomegaly or hernias noted.  No guarding or rebound tenderness.   Rectal: Not performed Msk:  Symmetrical without gross deformities. Good, equal movement & strength bilaterally. Pulses:  Normal pulses noted. Extremities:  No clubbing or edema.  No cyanosis. Neurologic:  Alert and oriented x3;  grossly normal neurologically. Skin:  Intact without significant lesions or rashes. No jaundice. Psych:  Alert and cooperative. Normal mood and affect.  Imaging Studies: Reviewed  Assessment and Plan:   CLIFTON KOVACIC is a 66 y.o. female with history of CKD, lupus, hypertension, mild iron deficiency anemia is seen in consultation for chronic symptoms of postprandial urgency, diarrhea and abdominal bloating  Recommend EGD with gastric and duodenal biopsies Patient has history of short segment Barrett's esophagus without dysplasia.  Recommend EGD for surveillance as well Advised patient to take omeprazole 20 mg daily before breakfast for chemoprevention given history of short segment Barrett's esophagus Advised patient to avoid sweet tea, carbonated beverages and creamers, sugary drinks as well as cut back on  red meat If symptoms are persistent and above work-up negative, recommend food allergy profile and alpha gal panel, pancreatic fecal elastase levels  Tubular adenoma of the colon Recommend colonoscopy for surveillance of colon polyps as well as TI evaluation and random colon biopsies  Follow up after above work-up   Cephas Darby, MD

## 2021-11-14 NOTE — Patient Instructions (Signed)
Barrett's Esophagus Barrett's esophagus occurs when the tissue that lines the esophagus changes or becomes damaged. The esophagus is the tube that carries food from the throat to the stomach. With Barrett's esophagus, the cells that line the esophagus are replaced by cells that are similar to the lining of the intestines (intestinal metaplasia). Barrett's esophagus itself may not cause any symptoms. However, many people who have Barrett's esophagus also have gastroesophageal reflux disease (GERD), which may cause symptoms such as heartburn. Over time, a few people with this condition may develop cancer of the esophagus. Treatment may include medicines, procedures to destroy the abnormal cells, or surgery. What are the causes? The exact cause of this condition is not known. In some cases, the condition develops from damage to the lining of the esophagus caused by gastroesophageal reflux disease (GERD). GERD occurs when stomach acids flow up from the stomach into the esophagus. Frequent symptoms of GERD may cause intestinal metaplasia or cause cell changes (dysplasia). What increases the risk? You are more likely to develop this condition if you: Have GERD. Are female. Are of European descent. Are obese. Are older than 50. Have a hiatal hernia. This is a condition in which part of your stomach bulges into your chest. Smoke. What are the signs or symptoms? People with Barrett's esophagus often have no symptoms. However, many people with this condition also have GERD. Symptoms of GERD may include: Heartburn. Difficulty swallowing. Dry cough. How is this diagnosed? This condition may be diagnosed based on: Results of an upper gastrointestinal endoscopy. For this exam, a thin, flexible tube with a light and a camera on the end (endoscope) is passed down your esophagus. Your health care provider can view the inside of your esophagus during this procedure. Results of a biopsy. For this procedure, several  tissue samples are removed (biopsy) from your esophagus to look at under a microscope. They are then checked for intestinal metaplasia or dysplasia. How is this treated? Treatment for this condition may include: Medicines (proton pump inhibitors, or PPIs) to decrease or stop GERD. Periodic endoscopic exams to make sure that cancer is not developing. A procedure or surgery for dysplasia. This may include: Removal or destruction of abnormal cells. Removal of part of the esophagus. Follow these instructions at home: Eating and drinking Eat more fruits and vegetables. Avoid fatty foods. Eat small, frequent meals instead of large meals. Avoid foods that cause heartburn. These foods include: Coffee and alcoholic drinks. Tomatoes and foods made with tomatoes. Greasy or spicy foods. Chocolate and peppermint. Do not drink alcohol. General instructions Take over-the-counter and prescription medicines only as told by your health care provider. Do not use any products that contain nicotine or tobacco, such as cigarettes, e-cigarettes, and chewing tobacco. If you need help quitting, ask your health care provider. If you are being treated for GERD, make sure you take medicines and follow all instructions as told by your health care provider. Keep all follow-up visits as told by your health care provider. This is important. Contact a health care provider if: You have heartburn or GERD symptoms. You have difficulty swallowing. Get help right away if: You have chest pain. You are unable to swallow. You vomit blood or material that looks like coffee grounds. Your stool (feces) is bright red or dark. These symptoms may represent a serious problem that is an emergency. Do not wait to see if the symptoms will go away. Get medical help right away. Call your local emergency services (911 in the  [place] Do not drive yourself to the hospital. Summary Barrett's esophagus occurs when the tissue that lines the  esophagus changes or becomes damaged. Barrett's esophagus may be diagnosed with an upper gastrointestinal endoscopy and a biopsy. Treatment may include medicines, procedures to remove abnormal cells, or surgery. Follow your health care provider's instructions about what to eat and drink, what medicines to take, and when to call for help. This information is not intended to replace advice given to you by your health care provider. Make sure you discuss any questions you have with your health care provider. Document Revised: 12/26/2019 Document Reviewed: 12/26/2019 Elsevier Patient Education  [employer].

## 2021-11-15 ENCOUNTER — Telehealth: Payer: Self-pay

## 2021-11-15 DIAGNOSIS — M542 Cervicalgia: Secondary | ICD-10-CM | POA: Diagnosis not present

## 2021-11-15 DIAGNOSIS — M9902 Segmental and somatic dysfunction of thoracic region: Secondary | ICD-10-CM | POA: Diagnosis not present

## 2021-11-15 DIAGNOSIS — M40292 Other kyphosis, cervical region: Secondary | ICD-10-CM | POA: Diagnosis not present

## 2021-11-15 DIAGNOSIS — M9901 Segmental and somatic dysfunction of cervical region: Secondary | ICD-10-CM | POA: Diagnosis not present

## 2021-11-15 NOTE — Telephone Encounter (Signed)
We have a pa on our side I called and told patient like she asked [place] to

## 2021-11-20 DIAGNOSIS — M40292 Other kyphosis, cervical region: Secondary | ICD-10-CM | POA: Diagnosis not present

## 2021-11-20 DIAGNOSIS — M542 Cervicalgia: Secondary | ICD-10-CM | POA: Diagnosis not present

## 2021-11-20 DIAGNOSIS — M9901 Segmental and somatic dysfunction of cervical region: Secondary | ICD-10-CM | POA: Diagnosis not present

## 2021-11-20 DIAGNOSIS — M9902 Segmental and somatic dysfunction of thoracic region: Secondary | ICD-10-CM | POA: Diagnosis not present

## 2021-11-22 DIAGNOSIS — M542 Cervicalgia: Secondary | ICD-10-CM | POA: Diagnosis not present

## 2021-11-22 DIAGNOSIS — M40292 Other kyphosis, cervical region: Secondary | ICD-10-CM | POA: Diagnosis not present

## 2021-11-22 DIAGNOSIS — M9902 Segmental and somatic dysfunction of thoracic region: Secondary | ICD-10-CM | POA: Diagnosis not present

## 2021-11-22 DIAGNOSIS — M9901 Segmental and somatic dysfunction of cervical region: Secondary | ICD-10-CM | POA: Diagnosis not present

## 2021-11-23 NOTE — Progress Notes (Signed)
The indication for Nutrition counseling is chronic renal disease and abnormal weight gain.

## 2021-11-24 ENCOUNTER — Other Ambulatory Visit: Payer: Self-pay

## 2021-11-24 NOTE — Progress Notes (Signed)
Patient states suprep is 40 dollars and wants to know what a cheaper medication. Informed her of Golytely she states she took that in the past and threw the solution up half way through drinking it. She will pay for [employer]

## 2021-11-27 ENCOUNTER — Telehealth: Payer: Self-pay | Admitting: Nurse Practitioner

## 2021-11-27 ENCOUNTER — Telehealth: Payer: Self-pay

## 2021-11-27 NOTE — Telephone Encounter (Signed)
Patient wanted dr Marius Ditch nurse to givce her a call [phone]

## 2021-11-27 NOTE — Telephone Encounter (Signed)
Patient dropped off handicap placard application for provider to complete.  Patient asked to be called at [phone] once completed.  Placed in provider's folder.

## 2021-11-28 DIAGNOSIS — M542 Cervicalgia: Secondary | ICD-10-CM | POA: Diagnosis not present

## 2021-11-28 DIAGNOSIS — M9902 Segmental and somatic dysfunction of thoracic region: Secondary | ICD-10-CM | POA: Diagnosis not present

## 2021-11-28 DIAGNOSIS — M9901 Segmental and somatic dysfunction of cervical region: Secondary | ICD-10-CM | POA: Diagnosis not present

## 2021-11-28 DIAGNOSIS — M40292 Other kyphosis, cervical region: Secondary | ICD-10-CM | POA: Diagnosis not present

## 2021-11-28 MED ORDER — GOLYTELY 236 G PO SOLR: 4000.0000 mL | Freq: Once | ORAL | 0 refills | Status: AC

## 2021-11-28 NOTE — Telephone Encounter (Signed)
Patient is calling she states she does not want to pay 47 dollars now for the prep. She would like Golytely sent in for her. Sent Golytely to the pharmacy

## 2021-11-28 NOTE — Addendum Note (Signed)
Addended by: Ulyess Blossom L on: 11/28/2021 08:19 AM   Modules accepted: Orders

## 2021-11-28 NOTE — Telephone Encounter (Signed)
Called pt to schedule an appt. Pt states she saw you in November and you guys talked about it and you told her that she just needs to fill out the form. Please advise.

## 2021-11-28 NOTE — Telephone Encounter (Signed)
Tried to call pt back to let her know what [name] said. Unable to leave vm due to full mailbox. OK to schedule pt an appt so that she is able to get her handicap placard form signed.

## 2021-12-01 ENCOUNTER — Other Ambulatory Visit: Payer: Self-pay | Admitting: Nurse Practitioner

## 2021-12-01 NOTE — Telephone Encounter (Signed)
Requested Prescriptions  Pending Prescriptions Disp Refills   traZODone (DESYREL) 50 MG tablet [Pharmacy Med Name: TRAZODONE 50 MG TABLET] 90 tablet 0    Sig: TAKE 1 TABLET BY MOUTH AT BEDTIME AS NEEDED FOR SLEEP.     Psychiatry: Antidepressants - Serotonin Modulator Passed - 12/01/2021  1:38 AM      Passed - Completed PHQ-2 or PHQ-9 in the last 360 days      Passed - Valid encounter within last 6 months    Recent Outpatient Visits          2 months ago Chronic maxillary sinusitis   [place], NP   3 years ago Cellulitis of right lower extremity   [facility] [place], [place], [place]   3 years ago Stage III lupus nephritis (WHO) [facility])   [facility] [place], [place]

## 2021-12-04 ENCOUNTER — Encounter: Payer: Self-pay | Admitting: Hematology and Oncology

## 2021-12-11 DIAGNOSIS — M3214 Glomerular disease in systemic lupus erythematosus: Secondary | ICD-10-CM | POA: Diagnosis not present

## 2021-12-11 DIAGNOSIS — N1832 Chronic kidney disease, stage 3b: Secondary | ICD-10-CM | POA: Diagnosis not present

## 2021-12-11 DIAGNOSIS — I10 Essential (primary) hypertension: Secondary | ICD-10-CM | POA: Diagnosis not present

## 2021-12-12 ENCOUNTER — Telehealth: Payer: Self-pay | Admitting: Gastroenterology

## 2021-12-12 DIAGNOSIS — M9902 Segmental and somatic dysfunction of thoracic region: Secondary | ICD-10-CM | POA: Diagnosis not present

## 2021-12-12 DIAGNOSIS — M542 Cervicalgia: Secondary | ICD-10-CM | POA: Diagnosis not present

## 2021-12-12 DIAGNOSIS — M9901 Segmental and somatic dysfunction of cervical region: Secondary | ICD-10-CM | POA: Diagnosis not present

## 2021-12-12 DIAGNOSIS — M40292 Other kyphosis, cervical region: Secondary | ICD-10-CM | POA: Diagnosis not present

## 2021-12-12 NOTE — Telephone Encounter (Signed)
Incoming call  Patients is requesting information in reference to her upcoming procedure. Requesting call back to mobile number.

## 2021-12-13 NOTE — Telephone Encounter (Signed)
Patient has some question about the Golytely informed her of the instructions  and she verbalized understanding

## 2021-12-14 DIAGNOSIS — I10 Essential (primary) hypertension: Secondary | ICD-10-CM | POA: Diagnosis not present

## 2021-12-14 DIAGNOSIS — M3214 Glomerular disease in systemic lupus erythematosus: Secondary | ICD-10-CM | POA: Diagnosis not present

## 2021-12-14 DIAGNOSIS — N1832 Chronic kidney disease, stage 3b: Secondary | ICD-10-CM | POA: Diagnosis not present

## 2021-12-14 DIAGNOSIS — E875 Hyperkalemia: Secondary | ICD-10-CM | POA: Diagnosis not present

## 2021-12-18 ENCOUNTER — Ambulatory Visit: Payer: Medicare Other | Admitting: Certified Registered"

## 2021-12-18 ENCOUNTER — Encounter: Payer: Self-pay | Admitting: Gastroenterology

## 2021-12-18 ENCOUNTER — Ambulatory Visit
Admission: RE | Admit: 2021-12-18 | Discharge: 2021-12-18 | Disposition: A | Discharge status: Home / Self Care | Payer: Medicare Other | Attending: Gastroenterology | Admitting: Gastroenterology

## 2021-12-18 ENCOUNTER — Encounter
Admission: RE | Disposition: A | Discharge status: Home / Self Care | Payer: Self-pay | Source: Home / Self Care | Attending: Gastroenterology

## 2021-12-18 DIAGNOSIS — K5730 Diverticulosis of large intestine without perforation or abscess without bleeding: Secondary | ICD-10-CM | POA: Diagnosis not present

## 2021-12-18 DIAGNOSIS — Z13810 Encounter for screening for upper gastrointestinal disorder: Secondary | ICD-10-CM | POA: Diagnosis not present

## 2021-12-18 DIAGNOSIS — Z860100 Personal history of colon polyps, unspecified: Secondary | ICD-10-CM

## 2021-12-18 DIAGNOSIS — R140 Abdominal distension (gaseous): Secondary | ICD-10-CM

## 2021-12-18 DIAGNOSIS — K2289 Other specified disease of esophagus: Secondary | ICD-10-CM | POA: Diagnosis not present

## 2021-12-18 DIAGNOSIS — F419 Anxiety disorder, unspecified: Secondary | ICD-10-CM | POA: Insufficient documentation

## 2021-12-18 DIAGNOSIS — K219 Gastro-esophageal reflux disease without esophagitis: Secondary | ICD-10-CM | POA: Diagnosis not present

## 2021-12-18 DIAGNOSIS — I10 Essential (primary) hypertension: Secondary | ICD-10-CM | POA: Insufficient documentation

## 2021-12-18 DIAGNOSIS — Z8719 Personal history of other diseases of the digestive system: Secondary | ICD-10-CM

## 2021-12-18 DIAGNOSIS — Z1211 Encounter for screening for malignant neoplasm of colon: Secondary | ICD-10-CM | POA: Insufficient documentation

## 2021-12-18 DIAGNOSIS — M329 Systemic lupus erythematosus, unspecified: Secondary | ICD-10-CM | POA: Diagnosis not present

## 2021-12-18 DIAGNOSIS — R197 Diarrhea, unspecified: Secondary | ICD-10-CM | POA: Diagnosis not present

## 2021-12-18 DIAGNOSIS — K319 Disease of stomach and duodenum, unspecified: Secondary | ICD-10-CM | POA: Insufficient documentation

## 2021-12-18 HISTORY — PX: ESOPHAGOGASTRODUODENOSCOPY (EGD) WITH PROPOFOL: SHX5813

## 2021-12-18 HISTORY — PX: COLONOSCOPY WITH PROPOFOL: SHX5780

## 2021-12-18 SURGERY — COLONOSCOPY WITH PROPOFOL
Anesthesia: General

## 2021-12-18 MED ORDER — GLYCOPYRROLATE 0.2 MG/ML IJ SOLN
INTRAMUSCULAR | Status: DC | PRN
  Administered 2021-12-18: .2 mg via INTRAVENOUS

## 2021-12-18 MED ORDER — SODIUM CHLORIDE 0.9 % IV SOLN: INTRAVENOUS | Status: DC

## 2021-12-18 MED ORDER — MIDAZOLAM HCL 2 MG/2ML IJ SOLN
INTRAMUSCULAR | Status: AC
  Filled 2021-12-18: qty 2

## 2021-12-18 MED ORDER — PROPOFOL 500 MG/50ML IV EMUL
INTRAVENOUS | Status: DC | PRN
  Administered 2021-12-18: 120 ug/kg/min via INTRAVENOUS

## 2021-12-18 MED ORDER — LIDOCAINE 2% (20 MG/ML) 5 ML SYRINGE
INTRAMUSCULAR | Status: DC | PRN
  Administered 2021-12-18: 20 mg via INTRAVENOUS

## 2021-12-18 MED ORDER — GLYCOPYRROLATE 0.2 MG/ML IJ SOLN
INTRAMUSCULAR | Status: AC
  Filled 2021-12-18: qty 1

## 2021-12-18 MED ORDER — PROPOFOL 10 MG/ML IV BOLUS
INTRAVENOUS | Status: DC | PRN
  Administered 2021-12-18: 70 mg via INTRAVENOUS
  Administered 2021-12-18: 30 mg via INTRAVENOUS

## 2021-12-18 MED ORDER — MIDAZOLAM HCL 5 MG/5ML IJ SOLN
INTRAMUSCULAR | Status: DC | PRN
  Administered 2021-12-18: 2 mg via INTRAVENOUS

## 2021-12-18 NOTE — Anesthesia Preprocedure Evaluation (Addendum)
Anesthesia Evaluation  Patient identified by MRN, date of birth, ID band Patient awake    Reviewed: Allergy & Precautions, NPO status , Patient's Chart, lab work & pertinent test results  History of Anesthesia Complications Negative for: history of anesthetic complications  Airway Mallampati: III  TM Distance: >3 FB Neck ROM: full    Dental  (+) Chipped   Pulmonary neg pulmonary ROS, neg shortness of breath, Patient abstained from smoking.,    Pulmonary exam normal        Cardiovascular Exercise Tolerance: Good hypertension, (-) angina(-) Past MI Normal cardiovascular exam     Neuro/Psych PSYCHIATRIC DISORDERS negative neurological ROS     GI/Hepatic negative GI ROS, Neg liver ROS, neg GERD  ,  Endo/Other  negative endocrine ROS  Renal/GU Renal disease  negative genitourinary   Musculoskeletal   Abdominal   Peds  Hematology negative hematology ROS (+)   Anesthesia Other Findings Past Medical History: No date: Abnormal pap No date: Anemia No date: Anxiety 1987: History of lupus No date: Hypertension No date: Low vitamin D level 1988: Renal insufficiency     Comment:  kidney biopsy 1988 and 1998- "my kidneys function at               like 48%"  Past Surgical History: 01/2021: BONE MARROW BIOPSY 07/08/2017: BREAST BIOPSY; Right 08/06/2017: BREAST EXCISIONAL BIOPSY; Right 07/1999: BREAST EXCISIONAL BIOPSY; Right 08/06/2017: BREAST LUMPECTOMY WITH RADIOACTIVE SEED LOCALIZATION;  Right     Comment:  Procedure: RIGHT BREAST LUMPECTOMY WITH RADIOACTIVE SEED              LOCALIZATION ERAS PATHWAY;  Surgeon: Excell Seltzer,               MD;  Location: [facility];  Service:               General;  Laterality: Right; 08/19/2018: BREAST LUMPECTOMY WITH RADIOACTIVE SEED LOCALIZATION;  Right     Comment:  Procedure: RIGHT BREAST LUMPECTOMY WITH RADIOACTIVE SEED              LOCALIZATION ERAS  PATHWAY;  Surgeon: Excell Seltzer,               MD;  Location: [facility];  Service: General;  Laterality:               Right; 07/1999: BREAST SURGERY     Comment:  right breast benign fibroadenoma 2012: COLONOSCOPY 05/01/2017: COLONOSCOPY WITH PROPOFOL; N/A     Comment:  Procedure: COLONOSCOPY WITH PROPOFOL;  Surgeon: Christene Lye, MD;  Location: [facility];  Service:               Endoscopy;  Laterality: N/A; No date: CRYOTHERAPY     Comment:  abnormal pap smear 05/01/2017: ESOPHAGOGASTRODUODENOSCOPY (EGD) WITH PROPOFOL; N/A     Comment:  Procedure: ESOPHAGOGASTRODUODENOSCOPY (EGD) WITH               PROPOFOL;  Surgeon: Christene Lye, MD;                Location: [facility];  Service: Endoscopy;                Laterality: N/A; 2007: NASAL SEPTUM SURGERY 1990: TUBAL LIGATION  BMI    Body Mass Index: 28.25 kg/m      Reproductive/Obstetrics negative OB ROS  Anesthesia Physical Anesthesia Plan  ASA: 3  Anesthesia Plan: General   Post-op Pain Management:    Induction: Intravenous  PONV Risk Score and Plan: Propofol infusion and TIVA  Airway Management Planned: Natural Airway and Nasal Cannula  Additional Equipment:   Intra-op Plan:   Post-operative Plan:   Informed Consent: I have reviewed the patients History and Physical, chart, labs and discussed the procedure including the risks, benefits and alternatives for the proposed anesthesia with the patient or authorized representative who has indicated his/her understanding and acceptance.     Dental Advisory Given  Plan Discussed with: Anesthesiologist, CRNA and Surgeon  Anesthesia Plan Comments: (Patient consented for risks of anesthesia including but not limited to:  - adverse reactions to medications - risk of airway placement if required - damage to eyes, teeth, lips or other oral mucosa - nerve damage due to  positioning  - sore throat or hoarseness - Damage to heart, brain, nerves, lungs, other parts of body or loss of life  Patient voiced understanding.)        Anesthesia Quick Evaluation

## 2021-12-18 NOTE — Anesthesia Postprocedure Evaluation (Signed)
Anesthesia Post Note  Patient: [name]  Procedure(s) Performed: COLONOSCOPY WITH PROPOFOL ESOPHAGOGASTRODUODENOSCOPY (EGD) WITH PROPOFOL  Patient location during evaluation: Endoscopy Anesthesia Type: General Level of consciousness: awake and alert Pain management: pain level controlled Vital Signs Assessment: post-procedure vital signs reviewed and stable Respiratory status: spontaneous breathing, nonlabored ventilation, respiratory function stable and patient connected to nasal cannula oxygen Cardiovascular status: blood pressure returned to baseline and stable Postop Assessment: no apparent nausea or vomiting Anesthetic complications: no   No notable events documented.   Last Vitals:  Vitals:   12/18/21 1107 12/18/21 1117  BP: (!) 105/59 (!) 119/59  Pulse: 74 71  Resp: 11 15  Temp: (!) 36.1 C   SpO2: 100% 100%    Last Pain:  Vitals:   12/18/21 1127  TempSrc:   PainSc: 0-No pain                 Precious Haws Ioane Bhola

## 2021-12-18 NOTE — Transfer of Care (Signed)
Immediate Anesthesia Transfer of Care Note  Patient: [name]  Procedure(s) Performed: COLONOSCOPY WITH PROPOFOL ESOPHAGOGASTRODUODENOSCOPY (EGD) WITH PROPOFOL  Patient Location: Endoscopy Unit  Anesthesia Type:General  Level of Consciousness: awake, alert  and oriented  Airway & Oxygen Therapy: Patient Spontanous Breathing  Post-op Assessment: Report given to RN and Post -op Vital signs reviewed and stable  Post vital signs: Reviewed  Last Vitals:  Vitals Value Taken Time  BP 105/59 12/18/21 1107  Temp 36.1 C 12/18/21 1107  Pulse 77 12/18/21 1107  Resp 13 12/18/21 1107  SpO2 100 % 12/18/21 1107  Vitals shown include unvalidated device data.  Last Pain:  Vitals:   12/18/21 1107  TempSrc: Tympanic  PainSc: Asleep         Complications: No notable events documented.

## 2021-12-18 NOTE — Op Note (Signed)
[facility] Patient Name: [name] Procedure Date: 12/18/2021 10:14 AM MRN: [id number] Account #: [id number] Date of Birth: 01/25/1956 Admit Type: Outpatient Age: 66 Room: [facility] ENDO ROOM 1 Gender: Female Note Status: Finalized Instrument Name: Upper Endoscope [id number] Procedure:             Upper GI endoscopy Indications:           Screening for Barrett's esophagus, Abdominal bloating,                         Diarrhea Providers:             Lin Landsman MD, MD Medicines:             General Anesthesia Complications:         No immediate complications. Estimated blood loss: None. Procedure:             Pre-Anesthesia Assessment:                        - Prior to the procedure, a History and Physical was                         performed, and patient medications and allergies were                         reviewed. The patient is competent. The risks and                         benefits of the procedure and the sedation options and                         risks were discussed with the patient. All questions                         were answered and informed consent was obtained.                         Patient identification and proposed procedure were                         verified by the physician, the nurse, the                         anesthesiologist, the anesthetist and the technician                         in the pre-procedure area in the procedure room in the                         endoscopy suite. Mental Status Examination: alert and                         oriented. Airway Examination: normal oropharyngeal                         airway and neck mobility. Respiratory Examination:                         clear to auscultation. CV  Examination: normal.                         Prophylactic Antibiotics: The patient does not require                         prophylactic antibiotics. Prior Anticoagulants: The                          patient has taken no previous anticoagulant or                         antiplatelet agents. ASA Grade Assessment: III - A                         patient with severe systemic disease. After reviewing                         the risks and benefits, the patient was deemed in                         satisfactory condition to undergo the procedure. The                         anesthesia plan was to use general anesthesia.                         Immediately prior to administration of medications,                         the patient was re-assessed for adequacy to receive                         sedatives. The heart rate, respiratory rate, oxygen                         saturations, blood pressure, adequacy of pulmonary                         ventilation, and response to care were monitored                         throughout the procedure. The physical status of the                         patient was re-assessed after the procedure.                        After obtaining informed consent, the endoscope was                         passed under direct vision. Throughout the procedure,                         the patient's blood pressure, pulse, and oxygen                         saturations were monitored continuously. The  Endosonoscope was introduced through the mouth, and                         advanced to the second part of duodenum. The upper GI                         endoscopy was accomplished without difficulty. The                         patient tolerated the procedure well. Findings:      The duodenal bulb and second portion of the duodenum were normal.       Biopsies were taken with a cold forceps for histology.      The entire examined stomach was normal. Biopsies were taken with a cold       forceps for histology.      The cardia and gastric fundus were normal on retroflexion.      The Z-line was irregular and was found 40 cm from the incisors. There        was no evidence of short segment barrett's      The gastroesophageal junction and examined esophagus were normal. Impression:            - Normal duodenal bulb and second portion of the                         duodenum. Biopsied.                        - Normal stomach. Biopsied.                        - Z-line irregular, 40 cm from the incisors.                        - Normal gastroesophageal junction and esophagus. Recommendation:        - Await pathology results.                        - Follow an antireflux regimen indefinitely.                        - Continue present medications.                        - Proceed with colonoscopy as scheduled                        See colonoscopy report Procedure Code(s):     --- Professional ---                        [phone], Esophagogastroduodenoscopy, flexible,                         transoral; with biopsy, single or multiple Diagnosis Code(s):     --- Professional ---                        K22.8, Other specified diseases of esophagus  Z13.810, Encounter for screening for upper                         gastrointestinal disorder                        R14.0, Abdominal distension (gaseous)                        R19.7, Diarrhea, unspecified CPT copyright 2019 American Medical Association. All rights reserved. The codes documented in this report are preliminary and upon coder review may  be revised to meet current compliance requirements. Dr. Ulyess Mort Lin Landsman MD, MD 12/18/2021 10:38:53 AM This report has been signed electronically. Number of Addenda: 0 Note Initiated On: 12/18/2021 10:14 AM Estimated Blood Loss:  Estimated blood loss: none.      [facility]

## 2021-12-18 NOTE — H&P (Signed)
Cephas Darby, MD [address]  [place]  [place], [place] [address]  Main: [phone]  Fax: [phone] Pager: [phone]  Primary Care Physician:  Jon Billings, NP Primary Gastroenterologist:  Dr. Cephas Darby  Pre-Procedure History & Physical: HPI:  [name] is a 66 y.o. female is here for an endoscopy and colonoscopy.   Past Medical History:  Diagnosis Date   Abnormal pap    Anemia    Anxiety    History of lupus 1987   Hypertension    Low vitamin D level    Renal insufficiency 1988   kidney biopsy 1988 and 1998- "my kidneys function at like 48%"    Past Surgical History:  Procedure Laterality Date   BONE MARROW BIOPSY  01/2021   BREAST BIOPSY Right 07/08/2017   BREAST EXCISIONAL BIOPSY Right 08/06/2017   BREAST EXCISIONAL BIOPSY Right 07/1999   BREAST LUMPECTOMY WITH RADIOACTIVE SEED LOCALIZATION Right 08/06/2017   Procedure: RIGHT BREAST LUMPECTOMY WITH RADIOACTIVE SEED LOCALIZATION [facility] PATHWAY;  Surgeon: Excell Seltzer, MD;  Location: [place];  Service: General;  Laterality: Right;   BREAST LUMPECTOMY WITH RADIOACTIVE SEED LOCALIZATION Right 08/19/2018   Procedure: RIGHT BREAST LUMPECTOMY [place];  Surgeon: Excell Seltzer, MD;  Location: [place] [facility];  Service: General;  Laterality: Right;   BREAST SURGERY  07/1999   right breast benign fibroadenoma   COLONOSCOPY  2012   COLONOSCOPY WITH PROPOFOL N/A 05/01/2017   Procedure: COLONOSCOPY WITH PROPOFOL;  Surgeon: Christene Lye, MD;  Location: [facility];  Service: Endoscopy;  Laterality: N/A;   CRYOTHERAPY     abnormal pap smear   ESOPHAGOGASTRODUODENOSCOPY (EGD) WITH PROPOFOL N/A 05/01/2017   Procedure: ESOPHAGOGASTRODUODENOSCOPY (EGD) WITH PROPOFOL;  Surgeon: Christene Lye, MD;  Location: [facility];  Service: Endoscopy;  Laterality: N/A;   NASAL SEPTUM SURGERY  2007   TUBAL LIGATION  1990    Prior  to Admission medications   Medication Sig Start Date End Date Taking? Authorizing Provider  aspirin 81 MG tablet Take 81 mg by mouth daily.   Yes [provider]  chlorthalidone (HYGROTON) 25 MG tablet Take 12.5 mg by mouth in the morning.   Yes [provider]  diazepam (VALIUM) 5 MG tablet Take by mouth.   Yes [provider]  enalapril (VASOTEC) 20 MG tablet Take 20 mg by mouth 2 (two) times daily. 04/18/21  Yes [provider]  hydroxychloroquine (PLAQUENIL) 200 MG tablet Take 200 mg by mouth every other day.    Yes [provider]  omeprazole (PRILOSEC) 20 MG capsule Take 20 mg by mouth daily.   Yes [provider]  Cholecalciferol (VITAMIN D3 PO) Take 1 tablet by mouth daily.     [provider]  cyanocobalamin 1000 MCG tablet Take 1,000 mcg by mouth daily. Vitamin B12    [provider]  Ferrous Sulfate (IRON) 325 (65 Fe) MG TABS Take by mouth.    [provider]  hydrochlorothiazide (HYDRODIURIL) 12.5 MG tablet Take 12.5 mg by mouth daily.    [provider]  traMADol (ULTRAM) 50 MG tablet Take 50 mg by mouth every 6 (six) hours as needed for moderate pain.    [provider]  traZODone (DESYREL) 50 MG tablet TAKE 1 TABLET BY MOUTH AT BEDTIME AS NEEDED FOR SLEEP. 12/01/21   Jon Billings, NP    Allergies as of 11/14/2021 - Review Complete 11/14/2021  Allergen Reaction Noted  Elemental sulfur Hives 02/04/2013   Sulfa antibiotics Hives, Itching, and Rash 03/11/2013    Family History  Problem Relation Age of Onset   Cancer Father        oral cancer   Cancer Maternal Grandmother        ovarian cancer   Hypertension Maternal Grandmother    Diabetes Maternal Grandfather    Diabetes Paternal Aunt    Breast cancer Neg Hx     Social History   Socioeconomic History   Marital status: Widowed    Spouse name: Not on file   Number of children: Not on file   Years of education: Not  on file   Highest education level: Not on file  Occupational History   Not on file  Tobacco Use   Smoking status: Never   Smokeless tobacco: Never  Vaping Use   Vaping Use: Never used  Substance and Sexual Activity   Alcohol use: Yes    Alcohol/week: 4.0 - 6.0 standard drinks    Types: 4 - 6 Standard drinks or equivalent per week    Comment: few beers on friday and saturday   Drug use: Yes    Types: Marijuana    Comment: few times a month    Sexual activity: Yes    Partners: Male    Birth control/protection: Surgical, Post-menopausal    Comment: BTL  Other Topics Concern   Not on file  Social History Narrative   Husband died 11/22/2016 from lung cancer at age 30 yrs old.     Social Determinants of Health   Financial Resource Strain: Low Risk    Difficulty of Paying Living Expenses: Not very hard  Food Insecurity: No Food Insecurity   Worried About Charity fundraiser in the Last Year: Never true   Ran Out of Food in the Last Year: Never true  Transportation Needs: Not on file  Physical Activity: Not on file  Stress: No Stress Concern Present   Feeling of Stress : Only a little  Social Connections: Moderately Integrated   Frequency of Communication with Friends and Family: Three times a week   Frequency of Social Gatherings with Friends and Family: More than three times a week   Attends Religious Services: More than 4 times per year   Active Member of Clubs or Organizations: Yes   Attends Archivist Meetings: 1 to 4 times per year   Marital Status: Widowed  Human resources officer Violence: Not on file    Review of Systems: See HPI, otherwise negative ROS  Physical Exam: BP (!) 142/66    Pulse 67    Temp (!) 97 F (36.1 C) (Temporal)    Resp 18    Ht 5' 6" (1.676 m)    Wt 79.4 kg    LMP 08/22/2008    SpO2 100%    BMI 28.25 kg/m  General:   Alert,  pleasant and cooperative in NAD Head:  Normocephalic and atraumatic. Neck:  Supple; no masses or  thyromegaly. Lungs:  Clear throughout to auscultation.    Heart:  Regular rate and rhythm. Abdomen:  Soft, nontender and nondistended. Normal bowel sounds, without guarding, and without rebound.   Neurologic:  Alert and  oriented x4;  grossly normal neurologically.  Impression/Plan: [name] is here for an endoscopy and colonoscopy to be performed for  chronic symptoms of postprandial urgency, diarrhea and abdominal bloating, history of short segment Barrett's esophagus without dysplasia. Tubular adenoma of the colon  Risks, benefits, limitations, and alternatives regarding  endoscopy and colonoscopy have been reviewed with the patient.  Questions have been answered.  All parties agreeable.   Sherri Sear, MD  12/18/2021, 10:17 AM

## 2021-12-18 NOTE — Op Note (Signed)
[facility]enter[facility] Patient Name: [name] Procedure Date: 12/18/2021 10:14 AM MRN: [id number] Account #: [id number] Date of Birth: [birth year] Admit Type: Outpatient Age: 66 Room: [facility] ENDO ROOM 1 Gender: Female Note Status: Finalized Instrument Name: Jasper Riling [id number] Procedure:             Colonoscopy Indications:           Surveillance: Personal history of adenomatous polyps                         on last colonoscopy 5 years ago, Last colonoscopy:                         July 2018 Providers:             Lin Landsman MD, MD Medicines:             General Anesthesia Complications:         No immediate complications. Estimated blood loss: None. Procedure:             Pre-Anesthesia Assessment:                        - Prior to the procedure, a History and Physical was                         performed, and patient medications and allergies were                         reviewed. The patient is competent. The risks and                         benefits of the procedure and the sedation options and                         risks were discussed with the patient. All questions                         were answered and informed consent was obtained.                         Patient identification and proposed procedure were                         verified by the physician, the nurse, the                         anesthesiologist, the anesthetist and the technician                         in the pre-procedure area in the procedure room in the                         endoscopy suite. Mental Status Examination: alert and                         oriented. Airway Examination: normal oropharyngeal                         airway and neck mobility.  Respiratory Examination:                         clear to auscultation. CV Examination: normal.                         Prophylactic Antibiotics: The patient does not require                          prophylactic antibiotics. Prior Anticoagulants: The                         patient has taken no previous anticoagulant or                         antiplatelet agents. ASA Grade Assessment: III - A                         patient with severe systemic disease. After reviewing                         the risks and benefits, the patient was deemed in                         satisfactory condition to undergo the procedure. The                         anesthesia plan was to use general anesthesia.                         Immediately prior to administration of medications,                         the patient was re-assessed for adequacy to receive                         sedatives. The heart rate, respiratory rate, oxygen                         saturations, blood pressure, adequacy of pulmonary                         ventilation, and response to care were monitored                         throughout the procedure. The physical status of the                         patient was re-assessed after the procedure.                        After obtaining informed consent, the colonoscope was                         passed under direct vision. Throughout the procedure,                         the patient's blood pressure, pulse, and oxygen  saturations were monitored continuously. The                         Colonoscope was introduced through the anus and                         advanced to the 15 cm into the ileum. The colonoscopy                         was performed with moderate difficulty due to multiple                         diverticula in the colon. Successful completion of the                         procedure was aided by withdrawing the scope and                         replacing with the pediatric colonoscope. The quality                         of the bowel preparation was adequate to identify                         polyps 6 mm and larger in size. Findings:       The perianal and digital rectal examinations were normal. Pertinent       negatives include normal sphincter tone and no palpable rectal lesions.      The terminal ileum appeared normal.      Normal mucosa was found in the entire colon. Biopsies for histology were       taken with a cold forceps from the entire colon for evaluation of       microscopic colitis.      Multiple small and large-mouthed diverticula were found in the       recto-sigmoid colon, sigmoid colon, descending colon and transverse       colon. There was no evidence of diverticular bleeding.      The retroflexed view of the distal rectum and anal verge was normal and       showed no anal or rectal abnormalities. Impression:            - The examined portion of the ileum was normal.                        - Normal mucosa in the entire examined colon. Biopsied.                        - Severe diverticulosis in the recto-sigmoid colon, in                         the sigmoid colon, in the descending colon and in the                         transverse colon. There was no evidence of                         diverticular bleeding.                        -  The distal rectum and anal verge are normal on                         retroflexion view. Recommendation:        - Discharge patient to home (with escort).                        - Resume previous diet today.                        - Continue present medications.                        - Await pathology results.                        - Repeat colonoscopy in 10 years for screening                         purposes.                        - Return to my office as previously scheduled. Procedure Code(s):     --- Professional ---                        [phone], Colonoscopy, flexible; with biopsy, single or                         multiple Diagnosis Code(s):     --- Professional ---                        Z86.010, Personal history of colonic polyps                        K57.30,  Diverticulosis of large intestine without                         perforation or abscess without bleeding CPT copyright 2019 American Medical Association. All rights reserved. The codes documented in this report are preliminary and upon coder review may  be revised to meet current compliance requirements. Dr. Ulyess Mort Lin Landsman MD, MD 12/18/2021 11:05:28 AM This report has been signed electronically. Number of Addenda: 0 Note Initiated On: 12/18/2021 10:14 AM Scope Withdrawal Time: 0 hours 13 minutes 45 seconds  Total Procedure Duration: 0 hours 22 minutes 46 seconds  Estimated Blood Loss:  Estimated blood loss: none.      [facility]

## 2021-12-19 ENCOUNTER — Encounter: Payer: Self-pay | Admitting: Gastroenterology

## 2021-12-19 LAB — SURGICAL PATHOLOGY

## 2021-12-20 ENCOUNTER — Telehealth: Payer: Self-pay

## 2021-12-20 DIAGNOSIS — R140 Abdominal distension (gaseous): Secondary | ICD-10-CM

## 2021-12-20 DIAGNOSIS — K529 Noninfective gastroenteritis and colitis, unspecified: Secondary | ICD-10-CM

## 2021-12-20 NOTE — Telephone Encounter (Signed)
-----   Message from Lin Landsman, MD sent at 12/19/2021 11:25 PM EST ----- ?Please inform patient that the pathology results came back normal from the upper endoscopy and colonoscopy.  Given her symptoms of bloating and diarrhea, recommend food allergy profile, alpha gal panel and pancreatic fecal elastase levels ? ?[name] ?

## 2021-12-20 NOTE — Telephone Encounter (Signed)
Called patient and patient verbalized understanding of results and will come to our office to have lab work   ?

## 2021-12-21 DIAGNOSIS — K529 Noninfective gastroenteritis and colitis, unspecified: Secondary | ICD-10-CM | POA: Diagnosis not present

## 2021-12-21 DIAGNOSIS — R140 Abdominal distension (gaseous): Secondary | ICD-10-CM | POA: Diagnosis not present

## 2021-12-25 ENCOUNTER — Telehealth: Payer: Self-pay

## 2021-12-25 ENCOUNTER — Telehealth: Payer: Self-pay | Admitting: *Deleted

## 2021-12-25 ENCOUNTER — Other Ambulatory Visit: Payer: Self-pay | Admitting: Gastroenterology

## 2021-12-25 DIAGNOSIS — R140 Abdominal distension (gaseous): Secondary | ICD-10-CM | POA: Diagnosis not present

## 2021-12-25 DIAGNOSIS — K529 Noninfective gastroenteritis and colitis, unspecified: Secondary | ICD-10-CM | POA: Diagnosis not present

## 2021-12-25 LAB — ALPHA-GAL PANEL
Allergen Lamb IgE: 0.18 kU/L — AB
Beef IgE: 0.13 kU/L — AB
IgE (Immunoglobulin E), Serum: 23 IU/mL (ref 6–495)
O215-IgE Alpha-Gal: <0.1 kU/L
Pork IgE: <0.1 kU/L

## 2021-12-25 LAB — FOOD ALLERGY PROFILE
Allergen Corn, IgE: <0.1 kU/L
Clam IgE: <0.1 kU/L
Codfish IgE: <0.1 kU/L
Egg White IgE: <0.1 kU/L
Milk IgE: 0.52 kU/L — AB
Peanut IgE: <0.1 kU/L
Scallop IgE: <0.1 kU/L
Sesame Seed IgE: <0.1 kU/L
Shrimp IgE: <0.1 kU/L
Soybean IgE: <0.1 kU/L
Walnut IgE: <0.1 kU/L
Wheat IgE: <0.1 kU/L

## 2021-12-25 NOTE — Telephone Encounter (Signed)
Called and left a message for call back  

## 2021-12-25 NOTE — Telephone Encounter (Signed)
Patient needs to reschedule appointment. ?

## 2021-12-25 NOTE — Telephone Encounter (Signed)
-----   Message from Lin Landsman, MD sent at 12/25/2021  4:18 PM EST ----- ?Please inform patient that she is allergic to milk, beef and lamb.  Recommend to eliminate these for at least a month and see if her symptoms improve.  She needs to drop off the stool specimen for pancreatic fecal elastase levels ? ?Rohini Vanga ?

## 2021-12-26 NOTE — Telephone Encounter (Signed)
She does not have Barrett's esophagus.  Therefore, she does not need to stay on long-term omeprazole unless she has heartburn or acid reflux symptoms.  If she does not have any reflux symptoms, she can switch to Pepcid over-the-counter as needed ? ?Sherri Sear, MD ?

## 2021-12-26 NOTE — Telephone Encounter (Signed)
Called and left a message for call back  

## 2021-12-26 NOTE — Telephone Encounter (Signed)
Patient verbalized understanding of results she states she will eliminate all of this. She states she dropped off the stool sample yesterday. Patient state she is out of the omeprazole '20mg'$  she gets over the counter. Patient wants to know if a prescription can be called in for her for something for acid reflex that she takes daily  ?

## 2021-12-27 ENCOUNTER — Ambulatory Visit: Payer: Medicare Other

## 2021-12-27 DIAGNOSIS — M9901 Segmental and somatic dysfunction of cervical region: Secondary | ICD-10-CM | POA: Diagnosis not present

## 2021-12-27 DIAGNOSIS — M40292 Other kyphosis, cervical region: Secondary | ICD-10-CM | POA: Diagnosis not present

## 2021-12-27 DIAGNOSIS — M542 Cervicalgia: Secondary | ICD-10-CM | POA: Diagnosis not present

## 2021-12-27 DIAGNOSIS — M9902 Segmental and somatic dysfunction of thoracic region: Secondary | ICD-10-CM | POA: Diagnosis not present

## 2021-12-29 LAB — PANCREATIC ELASTASE, FECAL: Pancreatic Elastase, Fecal: 117 ug Elast./g — ABNORMAL LOW (ref >200)

## 2022-01-01 ENCOUNTER — Telehealth: Payer: Self-pay

## 2022-01-01 MED ORDER — PANCRELIPASE (LIP-PROT-AMYL) 36000-114000 UNITS PO CPEP: ORAL_CAPSULE | ORAL | 2 refills | Status: AC

## 2022-01-01 NOTE — Telephone Encounter (Signed)
-----   Message from Lin Landsman, MD sent at 12/29/2021 12:09 PM EST ----- ?Stool studies revealed exocrine pancreatic insufficiency.  Recommend to send her prescription for Creon or Zenpep 2 capsules with each meal and 1 with snack.  Please make a follow-up to see me in 3 to 4 months ? ?RV ?

## 2022-01-01 NOTE — Telephone Encounter (Signed)
Patient verbalized understanding of results. She said to send the medication to the pharmacy. Made follow up appointment  ?

## 2022-01-03 ENCOUNTER — Encounter: Payer: Self-pay | Admitting: Internal Medicine

## 2022-01-04 ENCOUNTER — Telehealth: Payer: Self-pay

## 2022-01-04 NOTE — Telephone Encounter (Signed)
Called patient and left a detail message  

## 2022-01-04 NOTE — Telephone Encounter (Signed)
Patient called and wants recent labs, colonoscopy and EGD report printed for her so she can take it to her rheumatologist office. Printed this and she is going to come pick it up. Patient wants to know if the Lupus she was diagnosis with many years ago could be causing these problems  ?

## 2022-01-04 NOTE — Telephone Encounter (Signed)
Lupus is not causing her GI symptoms or EPI ? ?RV ?

## 2022-01-08 DIAGNOSIS — D473 Essential (hemorrhagic) thrombocythemia: Secondary | ICD-10-CM | POA: Diagnosis not present

## 2022-01-08 DIAGNOSIS — M65321 Trigger finger, right index finger: Secondary | ICD-10-CM | POA: Diagnosis not present

## 2022-01-08 DIAGNOSIS — M329 Systemic lupus erythematosus, unspecified: Secondary | ICD-10-CM | POA: Diagnosis not present

## 2022-01-08 DIAGNOSIS — N183 Chronic kidney disease, stage 3 unspecified: Secondary | ICD-10-CM | POA: Diagnosis not present

## 2022-01-17 ENCOUNTER — Ambulatory Visit: Payer: Self-pay | Admitting: *Deleted

## 2022-01-17 DIAGNOSIS — M65321 Trigger finger, right index finger: Secondary | ICD-10-CM | POA: Diagnosis not present

## 2022-01-17 NOTE — Telephone Encounter (Signed)
?  Chief Complaint: dizziness- hx vertigo- years ago ?Symptoms: dizziness with turning in bed, nausea ?Frequency: 2 days- in early am ?Pertinent Negatives: Patient denies headache, weakness, numbness, vomiting, earache ?Disposition: '[]'$ ED /'[]'$ Urgent Care (no appt availability in office) / '[x]'$ Appointment(In office/virtual)/ '[]'$  [facility]/ '[]'$ Home Care/ '[]'$ Refused Recommended Disposition /'[]'$ [facility]/ '[]'$  Follow-up with PCP ?Additional Notes:    ?

## 2022-01-17 NOTE — Telephone Encounter (Signed)
Reason for Disposition ? [1] NO dizziness now AND [2] age > 5 ? ?Answer Assessment - Initial Assessment Questions ?1. DESCRIPTION: "Describe your dizziness." ?    Vertigo- spinning sensation ?2. VERTIGO: "Do you feel like either you or the room is spinning or tilting?"  ?    yes ?3. LIGHTHEADED: "Do you feel lightheaded?" (e.g., somewhat faint, woozy, weak upon standing) ?    woozy ?4. SEVERITY: "How bad is it?"  "Can you walk?" ?  - MILD: Feels slightly dizzy and unsteady, but is walking normally. ?  - MODERATE: Feels unsteady when walking, but not falling; interferes with normal activities (e.g., school, work). ?  - SEVERE: Unable to walk without falling, or requires assistance to walk without falling. ?    After mid morning ?5. ONSET:  "When did the dizziness begin?" ?    Started yesterday am ?6. AGGRAVATING FACTORS: "Does anything make it worse?" (e.g., standing, change in head position) ?    Turning in the bed ?7. CAUSE: "What do you think is causing the dizziness?" ?    Vertigo hx ?8. RECURRENT SYMPTOM: "Have you had dizziness before?" If Yes, ask: "When was the last time?" "What happened that time?" ?    Yes- home remedies, medictaion ?9. OTHER SYMPTOMS: "Do you have any other symptoms?" (e.g., headache, weakness, numbness, vomiting, earache) ?    nausea ?10. PREGNANCY: "Is there any chance you are pregnant?" "When was your last menstrual period?" ? ?Protocols used: Dizziness - Vertigo-A-AH ? ?

## 2022-01-18 ENCOUNTER — Ambulatory Visit (INDEPENDENT_AMBULATORY_CARE_PROVIDER_SITE_OTHER): Payer: Medicare Other | Admitting: Internal Medicine

## 2022-01-18 ENCOUNTER — Encounter: Payer: Self-pay | Admitting: Internal Medicine

## 2022-01-18 VITALS — BP 102/64 | HR 70 | Temp 98.3°F | Ht 65.0 in | Wt 177.4 lb

## 2022-01-18 DIAGNOSIS — R42 Dizziness and giddiness: Secondary | ICD-10-CM | POA: Diagnosis not present

## 2022-01-18 MED ORDER — MECLIZINE HCL 12.5 MG PO TABS
12.5000 mg | ORAL_TABLET | Freq: Three times a day (TID) | ORAL | 0 refills | Status: DC | PRN
Start: 2022-01-18 — End: 2022-03-01

## 2022-01-18 NOTE — Progress Notes (Signed)
? ?BP 102/64   Pulse 70   Temp 98.3 ?F (36.8 ?C) (Oral)   Ht '5\' 5"'$  (1.651 m)   Wt 177 lb 6.4 oz (80.5 kg)   LMP 08/22/2008   SpO2 99%   BMI 29.52 kg/m?   ? ?Subjective:  ? ? Patient ID: [name], female    DOB: 10-Sep-1956, 66 y.o.   MRN: [id number] ? ?Chief Complaint  ?Patient presents with  ?? Dizziness  ?  For past 2 days, happens when she turns over in bed.   ? ? ?HPI: ?CHARMAYNE ODELL is a 66 y.o. female ? ?Dizziness ?Chronicity: room was spiinning when she got ready to get out of bed, she felt room spinning. she laid back down and got up slowly. The current episode started yesterday. The problem occurs intermittently. The problem has been waxing and waning. Associated symptoms include vertigo. Pertinent negatives include no abdominal pain, anorexia, arthralgias, change in bowel habit, chest pain, chills, congestion, coughing, diaphoresis, fatigue, fever, headaches, joint swelling, myalgias, nausea, neck pain, numbness, rash, sore throat, swollen glands, urinary symptoms, visual change, vomiting or weakness. Associated symptoms comments: Nausea yesterday morning, has had a ho migraines .  ? ?Chief Complaint  ?Patient presents with  ?? Dizziness  ?  For past 2 days, happens when she turns over in bed.   ? ? ?Relevant past medical, surgical, family and social history reviewed and updated as indicated. Interim medical history since our last visit reviewed. ?Allergies and medications reviewed and updated. ? ?Review of Systems  ?Constitutional:  Negative for chills, diaphoresis, fatigue and fever.  ?HENT:  Negative for congestion and sore throat.   ?Respiratory:  Negative for cough.   ?Cardiovascular:  Negative for chest pain.  ?Gastrointestinal:  Negative for abdominal pain, anorexia, change in bowel habit, nausea and vomiting.  ?Musculoskeletal:  Negative for arthralgias, joint swelling, myalgias and neck pain.  ?Skin:  Negative for rash.  ?Neurological:  Positive for dizziness and vertigo.  Negative for weakness, numbness and headaches.  ? ?Per HPI unless specifically indicated above ? ?   ?Objective:  ?  ?BP 102/64   Pulse 70   Temp 98.3 ?F (36.8 ?C) (Oral)   Ht '5\' 5"'$  (1.651 m)   Wt 177 lb 6.4 oz (80.5 kg)   LMP 08/22/2008   SpO2 99%   BMI 29.52 kg/m?   ?Wt Readings from Last 3 Encounters:  ?01/18/22 177 lb 6.4 oz (80.5 kg)  ?12/18/21 175 lb (79.4 kg)  ?11/14/21 182 lb 8 oz (82.8 kg)  ?  ?Physical Exam ?Vitals and nursing note reviewed.  ?Constitutional:   ?   General: She is not in acute distress. ?   Appearance: Normal appearance. She is not ill-appearing or diaphoretic.  ?Eyes:  ?   Conjunctiva/sclera: Conjunctivae normal.  ?Cardiovascular:  ?   Heart sounds: No murmur heard. ?  No friction rub. No gallop.  ?Pulmonary:  ?   Effort: No respiratory distress.  ?   Breath sounds: No stridor. No wheezing or rhonchi.  ?Skin: ?   General: Skin is warm and dry.  ?   Coloration: Skin is not jaundiced.  ?   Findings: No erythema.  ?Neurological:  ?   Mental Status: She is alert.  ?Psychiatric:     ?   Mood and Affect: Mood normal.     ?   Behavior: Behavior normal.  ? ?Results for orders placed or performed in visit on 12/25/21  ?Pancreatic elastase, fecal  ?Result  Value Ref Range  ? Pancreatic Elastase, Fecal 117 (L) >200 ug Elast./g  ? ?   ? ? ?Current Outpatient Medications:  ??  aspirin 81 MG tablet, Take 81 mg by mouth daily., Disp: , Rfl:  ??  chlorthalidone (HYGROTON) 25 MG tablet, Take 12.5 mg by mouth in the morning., Disp: , Rfl:  ??  Cholecalciferol (VITAMIN D3 PO), Take 1 tablet by mouth daily. , Disp: , Rfl:  ??  cyanocobalamin 1000 MCG tablet, Take 1,000 mcg by mouth daily. Vitamin B12, Disp: , Rfl:  ??  enalapril (VASOTEC) 20 MG tablet, Take 20 mg by mouth 2 (two) times daily., Disp: , Rfl:  ??  Ferrous Sulfate (IRON) 325 (65 Fe) MG TABS, Take by mouth., Disp: , Rfl:  ??  hydroxychloroquine (PLAQUENIL) 200 MG tablet, Take 200 mg by mouth every other day. , Disp: , Rfl:  ??   lipase/protease/amylase (CREON) 36000 UNITS CPEP capsule, Take 2 capsules with the first bite of each meal and 1 capsule with the first bite of each snack, Disp: 240 capsule, Rfl: 2 ??  meclizine (ANTIVERT) 12.5 MG tablet, Take 1 tablet (12.5 mg total) by mouth 3 (three) times daily as needed for dizziness., Disp: 30 tablet, Rfl: 0 ??  traMADol (ULTRAM) 50 MG tablet, Take 50 mg by mouth every 6 (six) hours as needed for moderate pain., Disp: , Rfl:  ??  traZODone (DESYREL) 50 MG tablet, TAKE 1 TABLET BY MOUTH AT BEDTIME AS NEEDED FOR SLEEP., Disp: 90 tablet, Rfl: 0  ? ? ?Assessment & Plan:  ?Vertigo : Start pt on meclizine for such. Pt has taken last 2 doses from her last rx which seemed to help with the symtpoms. ?Refills sent  ?Will need to fu with pcp for further wu and mx if the above doesn't help.  ?Consider further imaging / testing if recurs or meclzine doenst work.  ? ? ? ? ?Problem List Items Addressed This Visit   ? ?  ? Other  ? Vertigo - Primary  ?  ? ?No orders of the defined types were placed in this encounter. ?  ? ?Meds ordered this encounter  ?Medications  ?? meclizine (ANTIVERT) 12.5 MG tablet  ?  Sig: Take 1 tablet (12.5 mg total) by mouth 3 (three) times daily as needed for dizziness.  ?  Dispense:  30 tablet  ?  Refill:  0  ?  ? ?Follow up plan: ?Return in about 4 weeks (around 02/15/2022). ? ? ?

## 2022-01-24 DIAGNOSIS — M9901 Segmental and somatic dysfunction of cervical region: Secondary | ICD-10-CM | POA: Diagnosis not present

## 2022-01-24 DIAGNOSIS — M9902 Segmental and somatic dysfunction of thoracic region: Secondary | ICD-10-CM | POA: Diagnosis not present

## 2022-01-24 DIAGNOSIS — M542 Cervicalgia: Secondary | ICD-10-CM | POA: Diagnosis not present

## 2022-01-24 DIAGNOSIS — M40292 Other kyphosis, cervical region: Secondary | ICD-10-CM | POA: Diagnosis not present

## 2022-02-06 DIAGNOSIS — M9902 Segmental and somatic dysfunction of thoracic region: Secondary | ICD-10-CM | POA: Diagnosis not present

## 2022-02-06 DIAGNOSIS — M9901 Segmental and somatic dysfunction of cervical region: Secondary | ICD-10-CM | POA: Diagnosis not present

## 2022-02-06 DIAGNOSIS — M40292 Other kyphosis, cervical region: Secondary | ICD-10-CM | POA: Diagnosis not present

## 2022-02-06 DIAGNOSIS — M542 Cervicalgia: Secondary | ICD-10-CM | POA: Diagnosis not present

## 2022-02-08 DIAGNOSIS — M79672 Pain in left foot: Secondary | ICD-10-CM | POA: Diagnosis not present

## 2022-02-08 DIAGNOSIS — M329 Systemic lupus erythematosus, unspecified: Secondary | ICD-10-CM | POA: Diagnosis not present

## 2022-02-08 DIAGNOSIS — M2042 Other hammer toe(s) (acquired), left foot: Secondary | ICD-10-CM | POA: Diagnosis not present

## 2022-02-08 DIAGNOSIS — D2372 Other benign neoplasm of skin of left lower limb, including hip: Secondary | ICD-10-CM | POA: Diagnosis not present

## 2022-02-12 DIAGNOSIS — M542 Cervicalgia: Secondary | ICD-10-CM | POA: Diagnosis not present

## 2022-02-12 DIAGNOSIS — M40292 Other kyphosis, cervical region: Secondary | ICD-10-CM | POA: Diagnosis not present

## 2022-02-12 DIAGNOSIS — M9902 Segmental and somatic dysfunction of thoracic region: Secondary | ICD-10-CM | POA: Diagnosis not present

## 2022-02-12 DIAGNOSIS — M9901 Segmental and somatic dysfunction of cervical region: Secondary | ICD-10-CM | POA: Diagnosis not present

## 2022-02-22 ENCOUNTER — Ambulatory Visit: Payer: Medicare Other | Admitting: Nurse Practitioner

## 2022-02-27 ENCOUNTER — Other Ambulatory Visit: Payer: Self-pay | Admitting: Nurse Practitioner

## 2022-02-28 NOTE — Telephone Encounter (Signed)
Requested Prescriptions  ?Pending Prescriptions Disp Refills  ?? traZODone (DESYREL) 50 MG tablet [Pharmacy Med Name: TRAZODONE 50 MG TABLET] 90 tablet 0  ?  Sig: TAKE 1 TABLET BY MOUTH EVERY DAY AT BEDTIME AS NEEDED FOR SLEEP  ?  ? Psychiatry: Antidepressants - Serotonin Modulator Passed - 02/27/2022  2:30 AM  ?  ?  Passed - Completed PHQ-2 or PHQ-9 in the last 360 days  ?  ?  Passed - Valid encounter within last 6 months  ?  Recent Outpatient Visits   ?      ? 1 month ago Vertigo  ? [facility] Vigg, [place], MD  ? 5 months ago Chronic maxillary sinusitis  ? [place], NP  ? 3 years ago Cellulitis of right lower extremity  ? [place], [place]  ? 3 years ago Stage III lupus nephritis (WHO) ([place])  ? [place], [place], [place]  ?  ?  ?Future Appointments   ?        ? Tomorrow Jon Billings, NP [facility]  ? In 1 week Vanga, Tally Due, MD [place]  ?  ? ?  ?  ?  ? ?

## 2022-03-01 ENCOUNTER — Ambulatory Visit (INDEPENDENT_AMBULATORY_CARE_PROVIDER_SITE_OTHER): Payer: Medicare Other | Admitting: Nurse Practitioner

## 2022-03-01 ENCOUNTER — Encounter: Payer: Self-pay | Admitting: Nurse Practitioner

## 2022-03-01 VITALS — BP 135/76 | HR 77 | Temp 97.7°F | Wt 182.6 lb

## 2022-03-01 DIAGNOSIS — M7742 Metatarsalgia, left foot: Secondary | ICD-10-CM | POA: Diagnosis not present

## 2022-03-01 DIAGNOSIS — G4700 Insomnia, unspecified: Secondary | ICD-10-CM | POA: Diagnosis not present

## 2022-03-01 DIAGNOSIS — D2372 Other benign neoplasm of skin of left lower limb, including hip: Secondary | ICD-10-CM | POA: Diagnosis not present

## 2022-03-01 DIAGNOSIS — R42 Dizziness and giddiness: Secondary | ICD-10-CM

## 2022-03-01 DIAGNOSIS — M79672 Pain in left foot: Secondary | ICD-10-CM | POA: Diagnosis not present

## 2022-03-01 DIAGNOSIS — M329 Systemic lupus erythematosus, unspecified: Secondary | ICD-10-CM | POA: Diagnosis not present

## 2022-03-01 MED ORDER — MECLIZINE HCL 12.5 MG PO TABS: 12.5000 mg | ORAL_TABLET | Freq: Three times a day (TID) | ORAL | 0 refills | Status: DC | PRN

## 2022-03-01 MED ORDER — METHYLPREDNISOLONE 4 MG PO TBPK
ORAL_TABLET | ORAL | 0 refills | Status: DC
Start: 2022-03-01 — End: 2022-03-12

## 2022-03-01 NOTE — Assessment & Plan Note (Signed)
Ongoing. Improving.  Will continue with Meclizine PRN.  Will also give medrol dose pak to see if symptoms improve.  Recommend daily zyrtec or allegra to help with allergy symptoms.  Follow up if symptoms worsen or do not improve. ?

## 2022-03-01 NOTE — Assessment & Plan Note (Signed)
Chronic. Recommend cutting trazodone in half to see if symptoms improve. If not improved can try tylenol PM.  Follow up if symptoms do not improve. ?

## 2022-03-01 NOTE — Progress Notes (Signed)
? ?BP 135/76   Pulse 77   Temp 97.7 ?F (36.5 ?C) (Oral)   Wt 182 lb 9.6 oz (82.8 kg)   LMP 08/22/2008   SpO2 97%   BMI 30.39 kg/m?   ? ?Subjective:  ? ? Patient ID: [name], female    DOB: 09-22-1956, 66 y.o.   MRN: [id number] ? ?HPI: ?[name] is a 66 y.o. female ? ?Chief Complaint  ?Patient presents with  ? Dizziness  ?  Follow up , meclizine is helping.   ? ?DIZZINESS ?Patient states she was given the meclizine which does help her symptoms.  Patient states she does have a little bit of the dizziness when she moves certain ways.  When she lays her head down on the pillow it feels like the room is spinning.  She denies any falls.   ?Duration: weeks ?Description of symptoms: room spinning ?Duration of episode: minutes ?Dizziness frequency: no history of the same ?Provoking factors:  movements ?Aggravating factors:   sudden movements ?Triggered by rolling over in bed: yes ?Triggered by bending over: no ?Aggravated by head movement: yes ?Aggravated by exertion, coughing, loud noises: no ?Recent head injury: no ?Recent or current viral symptoms: no ?History of vasovagal episodes: no ? ?INSOMNIA ?Patient states she has a hang over feeling with taking the trazodone.  She would like xanax.  Has taken tylenol but not tylenol PM.  ? ?Relevant past medical, surgical, family and social history reviewed and updated as indicated. Interim medical history since our last visit reviewed. ?Allergies and medications reviewed and updated. ? ?Review of Systems  ?Neurological:  Positive for dizziness.  ? ?Per HPI unless specifically indicated above ? ?   ?Objective:  ?  ?BP 135/76   Pulse 77   Temp 97.7 ?F (36.5 ?C) (Oral)   Wt 182 lb 9.6 oz (82.8 kg)   LMP 08/22/2008   SpO2 97%   BMI 30.39 kg/m?   ?Wt Readings from Last 3 Encounters:  ?03/01/22 182 lb 9.6 oz (82.8 kg)  ?01/18/22 177 lb 6.4 oz (80.5 kg)  ?12/18/21 175 lb (79.4 kg)  ?  ?Physical Exam ?Vitals and nursing note reviewed.  ?Constitutional:   ?    General: She is not in acute distress. ?   Appearance: Normal appearance. She is normal weight. She is not ill-appearing, toxic-appearing or diaphoretic.  ?HENT:  ?   Head: Normocephalic.  ?   Right Ear: External ear normal. A middle ear effusion is present.  ?   Left Ear: External ear normal. A middle ear effusion is present.  ?   Nose: Congestion present.  ?   Mouth/Throat:  ?   Mouth: Mucous membranes are moist.  ?   Pharynx: Oropharynx is clear.  ?Eyes:  ?   General:     ?   Right eye: No discharge.     ?   Left eye: No discharge.  ?   Extraocular Movements: Extraocular movements intact.  ?   Conjunctiva/sclera: Conjunctivae normal.  ?   Pupils: Pupils are equal, round, and reactive to light.  ?Cardiovascular:  ?   Rate and Rhythm: Normal rate and regular rhythm.  ?   Heart sounds: No murmur heard. ?Pulmonary:  ?   Effort: Pulmonary effort is normal. No respiratory distress.  ?   Breath sounds: Normal breath sounds. No wheezing or rales.  ?Musculoskeletal:  ?   Cervical back: Normal range of motion and neck supple.  ?Skin: ?   General: Skin  is warm and dry.  ?   Capillary Refill: Capillary refill takes less than 2 seconds.  ?Neurological:  ?   General: No focal deficit present.  ?   Mental Status: She is alert and oriented to person, place, and time. Mental status is at baseline.  ?Psychiatric:     ?   Mood and Affect: Mood normal.     ?   Behavior: Behavior normal.     ?   Thought Content: Thought content normal.     ?   Judgment: Judgment normal.  ? ? ?Results for orders placed or performed in visit on 12/25/21  ?Pancreatic elastase, fecal  ?Result Value Ref Range  ? Pancreatic Elastase, Fecal 117 (L) >200 ug Elast./g  ? ?   ?Assessment & Plan:  ? ?Problem List Items Addressed This Visit   ? ?  ? Other  ? Insomnia  ?  Chronic. Recommend cutting trazodone in half to see if symptoms improve. If not improved can try tylenol PM.  Follow up if symptoms do not improve. ? ?  ?  ? Vertigo - Primary  ?  Ongoing.  Improving.  Will continue with Meclizine PRN.  Will also give medrol dose pak to see if symptoms improve.  Recommend daily zyrtec or allegra to help with allergy symptoms.  Follow up if symptoms worsen or do not improve. ? ?  ?  ?  ? ?Follow up plan: ?Return if symptoms worsen or fail to improve. ? ? ?A total of 30 minutes were spent on this encounter today.  When total time is documented, this includes both the face-to-face and non-face-to-face time personally spent before, during and after the visit on the date of the encounter discussed symptoms, medications, and plan of care.  ? ? ? ?

## 2022-03-05 ENCOUNTER — Telehealth: Payer: Self-pay | Admitting: Gastroenterology

## 2022-03-05 NOTE — Telephone Encounter (Signed)
We do not have any samples of creon. Please advise what you recommend  ?

## 2022-03-05 NOTE — Telephone Encounter (Signed)
Called and left a message for call back  

## 2022-03-05 NOTE — Telephone Encounter (Signed)
Pt left message to see if you have samples of new med that was prescribed creon because it is expensive for her at this time ?

## 2022-03-05 NOTE — Telephone Encounter (Signed)
Not sure if she would qualify for patient assistance program.  If not, we can try Zenpep ? ?RV ?

## 2022-03-06 MED ORDER — ZENPEP 40000-126000 UNITS PO CPEP
ORAL_CAPSULE | ORAL | 0 refills | Status: DC
Start: 2022-03-06 — End: 2022-03-08

## 2022-03-06 NOTE — Telephone Encounter (Signed)
Patient states she talk to medicare yesterday and they suggested that she do patient assistance. They sent her the paper work and she will fill out her portion of it and then bring by our portion to fill out. She said to send the Zenpep and see if it is cheaper  ?

## 2022-03-06 NOTE — Addendum Note (Signed)
Addended by: Ulyess Blossom L on: 03/06/2022 08:16 AM ? ? Modules accepted: Orders ? ?

## 2022-03-07 DIAGNOSIS — Z79899 Other long term (current) drug therapy: Secondary | ICD-10-CM | POA: Diagnosis not present

## 2022-03-07 DIAGNOSIS — Z8669 Personal history of other diseases of the nervous system and sense organs: Secondary | ICD-10-CM | POA: Diagnosis not present

## 2022-03-07 DIAGNOSIS — H524 Presbyopia: Secondary | ICD-10-CM | POA: Diagnosis not present

## 2022-03-07 DIAGNOSIS — M3210 Systemic lupus erythematosus, organ or system involvement unspecified: Secondary | ICD-10-CM | POA: Diagnosis not present

## 2022-03-07 DIAGNOSIS — H43812 Vitreous degeneration, left eye: Secondary | ICD-10-CM | POA: Diagnosis not present

## 2022-03-07 DIAGNOSIS — H2513 Age-related nuclear cataract, bilateral: Secondary | ICD-10-CM | POA: Diagnosis not present

## 2022-03-07 DIAGNOSIS — H52223 Regular astigmatism, bilateral: Secondary | ICD-10-CM | POA: Diagnosis not present

## 2022-03-07 DIAGNOSIS — H5213 Myopia, bilateral: Secondary | ICD-10-CM | POA: Diagnosis not present

## 2022-03-08 ENCOUNTER — Other Ambulatory Visit: Payer: Self-pay

## 2022-03-08 ENCOUNTER — Telehealth: Payer: Self-pay

## 2022-03-08 NOTE — Progress Notes (Signed)
Patient states the Zenpep was more then creon

## 2022-03-08 NOTE — Telephone Encounter (Signed)
Patient brought by patient assistance forms for creon. She had brought her medical expensive and filled out her portion of the forms. I filled out the prescribe  portion of the form and faxed it to the Patient assistance for creon. Called and informed patient that the form was faxed

## 2022-03-12 ENCOUNTER — Encounter: Payer: Self-pay | Admitting: Gastroenterology

## 2022-03-12 ENCOUNTER — Ambulatory Visit (INDEPENDENT_AMBULATORY_CARE_PROVIDER_SITE_OTHER): Payer: Medicare Other | Admitting: Gastroenterology

## 2022-03-12 VITALS — BP 124/74 | HR 73 | Temp 98.2°F | Ht 65.0 in | Wt 182.1 lb

## 2022-03-12 DIAGNOSIS — K8681 Exocrine pancreatic insufficiency: Secondary | ICD-10-CM | POA: Diagnosis not present

## 2022-03-12 DIAGNOSIS — Z91018 Allergy to other foods: Secondary | ICD-10-CM | POA: Diagnosis not present

## 2022-03-12 DIAGNOSIS — K529 Noninfective gastroenteritis and colitis, unspecified: Secondary | ICD-10-CM | POA: Diagnosis not present

## 2022-03-12 DIAGNOSIS — K9049 Malabsorption due to intolerance, not elsewhere classified: Secondary | ICD-10-CM

## 2022-03-12 NOTE — Progress Notes (Signed)
Cephas Darby, MD [address]  [place]  [place], [place] [address]  Main: [phone]  Fax: [phone]    Gastroenterology Consultation  Referring Provider:     Jon Billings, NP Primary Care Physician:  Jon Billings, NP Primary Gastroenterologist:  Dr. Cephas Darby Reason for Consultation:     Postprandial urgency, diarrhea        HPI:   BERANIA PEEDIN is a 66 y.o. female referred by Dr. Jon Billings, NP  for consultation & management of chronic symptoms of sporadic episodes of postprandial urgency, nonbloody diarrhea.  She reports that she has been having the symptoms for almost a year.  Patient denies any nausea, vomiting, abdominal pain, weight loss or loss of appetite.  She does state that she makes her coffee "blonde " by adding creamer.  She does consume sweet tea, 2 cups daily.  She does report abdominal bloating.  Patient has history of stage II-III CKD, lupus, anemia of chronic disease.   Follow-up visit 03/12/2022 Patient is here for follow-up of postprandial urgency and diarrhea.  She underwent repeat upper endoscopy and colonoscopy including biopsies which were unremarkable.  Her pancreatic fecal elastase levels were low at 117, alpha gal panel was abnormal for beef and lamb IgE levels, food allergy profile was abnormal for milk IgE levels.  Therefore, I have advised patient to eliminate these food products as well as start pancreatic enzyme replacement therapy.  She is currently on Creon and awaiting for patient assistance program for long-term maintenance therapy.  Patient reports that when she went off of milk, beef and lamb for 1 month, she felt significantly better.  She started reintroducing these foods with Creon and noticed that she is able to tolerate better without much GI symptoms.  She currently ran out of Creon.  Patient does not smoke or drink alcohol  NSAIDs: None  Antiplts/Anticoagulants/Anti thrombotics: None  GI  Procedures:  EGD and colonoscopy 12/18/2021 for chronic diarrhea - Normal duodenal bulb and second portion of the duodenum. Biopsied. - Normal stomach. Biopsied. - Z-line irregular, 40 cm from the incisors. - Normal gastroesophageal junction and esophagus.  - The examined portion of the ileum was normal. - Normal mucosa in the entire examined colon. Biopsied. - Severe diverticulosis in the recto-sigmoid colon, in the sigmoid colon, in the descending colon and in the transverse colon. There was no evidence of diverticular bleeding. - The distal rectum and anal verge are normal on retroflexion view.  DIAGNOSIS:  A.  DUODENUM; COLD BIOPSY:  - DUODENAL MUCOSA WITH INTACT VILLI.  - NEGATIVE FOR ACTIVE INFLAMMATION, INTRAEPITHELIAL LYMPHOCYTOSIS, AND  INFECTIOUS AGENTS.   B.  STOMACH; RANDOM COLD BIOPSY:  - ANTRAL-TYPE MUCOSA WITH REACTIVE GASTROPATHY.  - NEGATIVE FOR H. PYLORI, INTESTINAL METAPLASIA, DYSPLASIA, AND  MALIGNANCY.   C.  COLON; RANDOM COLD BIOPSY:  - COLONIC MUCOSA WITH INTACT CRYPT ARCHITECTURE.  - LYMPHOID AGGREGATES WITH REACTIVE GERMINAL CENTERS.  - ONE EARLY APHTHOID LESION, SEE COMMENT.   EGD and colonoscopy 05/01/2017 - Normal examined duodenum. - Normal stomach. - Esophageal mucosal changes suspicious for short-segment Barrett's esophagus. Biopsied. - The examination was otherwise normal.  - Diverticulosis in the sigmoid colon, in the descending colon and in the transverse colon. - One 5 mm polyp at the recto-sigmoid colon, removed with a hot snare. Resected and retrieved. - The examination was otherwise normal.  DIAGNOSIS:  A. GE JUNCTION; COLD BIOPSY:  - COLUMNAR MUCOSA WITH INTESTINAL METAPLASIA.  - NEGATIVE FOR  DYSPLASIA AND MALIGNANCY.   Note: If a characteristic lesion was identified endoscopically within  the GE junction, the histologic findings would fulfill criteria for  Barrett's esophagus.   B. COLON POLYP, RECTOSIGMOID; HOT SNARE:  - TUBULAR  ADENOMA.  - NEGATIVE FOR HIGH-GRADE DYSPLASIA AND MALIGNANCY.  Past Medical History:  Diagnosis Date   Abnormal pap    Anemia    Anxiety    History of lupus 1987   Hypertension    Low vitamin D level    Renal insufficiency 1988   kidney biopsy 1988 and 1998- "my kidneys function at like 48%"    Past Surgical History:  Procedure Laterality Date   BONE MARROW BIOPSY  01/2021   BREAST BIOPSY Right 07/08/2017   BREAST EXCISIONAL BIOPSY Right 08/06/2017   BREAST EXCISIONAL BIOPSY Right 07/1999   BREAST LUMPECTOMY WITH RADIOACTIVE SEED LOCALIZATION Right 08/06/2017   Procedure: RIGHT BREAST LUMPECTOMY WITH RADIOACTIVE SEED LOCALIZATION [facility] PATHWAY;  Surgeon: Excell Seltzer, MD;  Location: [place];  Service: General;  Laterality: Right;   BREAST LUMPECTOMY WITH RADIOACTIVE SEED LOCALIZATION Right 08/19/2018   Procedure: RIGHT BREAST LUMPECTOMY [place];  Surgeon: Excell Seltzer, MD;  Location: [place];  Service: General;  Laterality: Right;   BREAST SURGERY  07/1999   right breast benign fibroadenoma   COLONOSCOPY  2012   COLONOSCOPY WITH PROPOFOL N/A 05/01/2017   Procedure: COLONOSCOPY WITH PROPOFOL;  Surgeon: Christene Lye, MD;  Location: [facility] ENDOSCOPY;  Service: Endoscopy;  Laterality: N/A;   COLONOSCOPY WITH PROPOFOL N/A 12/18/2021   Procedure: COLONOSCOPY WITH PROPOFOL;  Surgeon: Lin Landsman, MD;  Location: [facility] ENDOSCOPY;  Service: Gastroenterology;  Laterality: N/A;   CRYOTHERAPY     abnormal pap smear   ESOPHAGOGASTRODUODENOSCOPY (EGD) WITH PROPOFOL N/A 05/01/2017   Procedure: ESOPHAGOGASTRODUODENOSCOPY (EGD) WITH PROPOFOL;  Surgeon: Christene Lye, MD;  Location: [facility] ENDOSCOPY;  Service: Endoscopy;  Laterality: N/A;   ESOPHAGOGASTRODUODENOSCOPY (EGD) WITH PROPOFOL N/A 12/18/2021   Procedure: ESOPHAGOGASTRODUODENOSCOPY (EGD) WITH PROPOFOL;  Surgeon: Lin Landsman, MD;  Location: [facility]  ENDOSCOPY;  Service: Gastroenterology;  Laterality: N/A;   NASAL SEPTUM SURGERY  2007   TUBAL LIGATION  1990    Current Outpatient Medications:    aspirin 81 MG tablet, Take 81 mg by mouth daily., Disp: , Rfl:    chlorthalidone (HYGROTON) 25 MG tablet, Take 12.5 mg by mouth in the morning., Disp: , Rfl:    Cholecalciferol (VITAMIN D3 PO), Take 1 tablet by mouth daily. , Disp: , Rfl:    cyanocobalamin 1000 MCG tablet, Take 1,000 mcg by mouth daily. Vitamin B12, Disp: , Rfl:    enalapril (VASOTEC) 20 MG tablet, Take 20 mg by mouth 2 (two) times daily., Disp: , Rfl:    Ferrous Sulfate (IRON) 325 (65 Fe) MG TABS, Take by mouth., Disp: , Rfl:    hydroxychloroquine (PLAQUENIL) 200 MG tablet, Take 200 mg by mouth every other day. , Disp: , Rfl:    lipase/protease/amylase (CREON) 36000 UNITS CPEP capsule, Take 2 capsules with the first bite of each meal and 1 capsule with the first bite of each snack, Disp: 240 capsule, Rfl: 2   meclizine (ANTIVERT) 12.5 MG tablet, Take 1 tablet (12.5 mg total) by mouth 3 (three) times daily as needed for dizziness., Disp: 30 tablet, Rfl: 0   traMADol (ULTRAM) 50 MG tablet, Take 50 mg by mouth every 6 (six) hours as needed for moderate pain., Disp: , Rfl:  traZODone (DESYREL) 50 MG tablet, TAKE 1 TABLET BY MOUTH EVERY DAY AT BEDTIME AS NEEDED FOR SLEEP, Disp: 90 tablet, Rfl: 0    Family History  Problem Relation Age of Onset   Cancer Father        oral cancer   Cancer Maternal Grandmother        ovarian cancer   Hypertension Maternal Grandmother    Diabetes Maternal Grandfather    Diabetes Paternal Aunt    Breast cancer Neg Hx      Social History   Tobacco Use   Smoking status: Never   Smokeless tobacco: Never  Vaping Use   Vaping Use: Never used  Substance Use Topics   Alcohol use: Yes    Alcohol/week: 4.0 - 6.0 standard drinks    Types: 4 - 6 Standard drinks or equivalent per week    Comment: few beers on friday and saturday   Drug use: Yes     Types: Marijuana    Comment: few times a month     Allergies as of 03/12/2022 - Review Complete 03/12/2022  Allergen Reaction Noted   Elemental sulfur Hives 02/04/2013   Sulfa antibiotics Hives, Itching, and Rash 03/11/2013    Review of Systems:    All systems reviewed and negative except where noted in HPI.   Physical Exam:  BP 124/74 (BP Location: Left Arm, Patient Position: Sitting, Cuff Size: Normal)   Pulse 73   Temp 98.2 F (36.8 C) (Oral)   Ht 5' 5" (1.651 m)   Wt 182 lb 2 oz (82.6 kg)   LMP 08/22/2008   BMI 30.31 kg/m  Patient's last menstrual period was 08/22/2008.  General:   Alert,  Well-developed, well-nourished, pleasant and cooperative in NAD Head:  Normocephalic and atraumatic. Eyes:  Sclera clear, no icterus.   Conjunctiva pink. Ears:  Normal auditory acuity. Nose:  No deformity, discharge, or lesions. Mouth:  No deformity or lesions,oropharynx pink & moist. Neck:  Supple; no masses or thyromegaly. Lungs:  Respirations even and unlabored.  Clear throughout to auscultation.   No wheezes, crackles, or rhonchi. No acute distress. Heart:  Regular rate and rhythm; no murmurs, clicks, rubs, or gallops. Abdomen:  Normal bowel sounds. Soft, non-tender and non-distended without masses, hepatosplenomegaly or hernias noted.  No guarding or rebound tenderness.   Rectal: Not performed Msk:  Symmetrical without gross deformities. Good, equal movement & strength bilaterally. Pulses:  Normal pulses noted. Extremities:  No clubbing or edema.  No cyanosis. Neurologic:  Alert and oriented x3;  grossly normal neurologically. Skin:  Intact without significant lesions or rashes. No jaundice. Psych:  Alert and cooperative. Normal mood and affect.  Imaging Studies: Reviewed  Assessment and Plan:   YAQUELIN LANGELIER is a 66 y.o. female with history of CKD, lupus, hypertension, mild iron deficiency anemia is seen in for chronic symptoms of postprandial urgency, diarrhea and  abdominal bloating, found to have alpha gal as well as intolerance to milk, moderate pancreatic insufficiency.  Patient reports symptomatic improvement with avoidance of beef, lamb as well as milk.  However, she is taking Creon which allowed her to reintroduce these foods without much GI symptoms.  Recommend to repeat pancreatic fecal elastase levels, if still low recommend CT abdomen pelvis pancreas protocol Encouraged patient to avoid milk, beef and lamb  Follow up based on the above work-up   Cephas Darby, MD

## 2022-03-22 DIAGNOSIS — K529 Noninfective gastroenteritis and colitis, unspecified: Secondary | ICD-10-CM | POA: Diagnosis not present

## 2022-03-26 ENCOUNTER — Inpatient Hospital Stay: Payer: Medicare Other | Attending: Internal Medicine

## 2022-03-26 DIAGNOSIS — D519 Vitamin B12 deficiency anemia, unspecified: Secondary | ICD-10-CM

## 2022-03-26 DIAGNOSIS — Z7982 Long term (current) use of aspirin: Secondary | ICD-10-CM | POA: Insufficient documentation

## 2022-03-26 DIAGNOSIS — E538 Deficiency of other specified B group vitamins: Secondary | ICD-10-CM | POA: Insufficient documentation

## 2022-03-26 DIAGNOSIS — D631 Anemia in chronic kidney disease: Secondary | ICD-10-CM | POA: Diagnosis not present

## 2022-03-26 DIAGNOSIS — D75839 Thrombocytosis, unspecified: Secondary | ICD-10-CM | POA: Diagnosis not present

## 2022-03-26 DIAGNOSIS — Z79899 Other long term (current) drug therapy: Secondary | ICD-10-CM | POA: Diagnosis not present

## 2022-03-26 DIAGNOSIS — N183 Chronic kidney disease, stage 3 unspecified: Secondary | ICD-10-CM | POA: Insufficient documentation

## 2022-03-26 LAB — CBC WITH DIFFERENTIAL/PLATELET
Abs Immature Granulocytes: 0.04 10*3/uL (ref 0.00–0.07)
Basophils Absolute: 0.1 10*3/uL (ref 0.0–0.1)
Basophils Relative: 1 %
Eosinophils Absolute: 0.3 10*3/uL (ref 0.0–0.5)
Eosinophils Relative: 3 %
HCT: 34.8 % — ABNORMAL LOW (ref 36.0–46.0)
Hemoglobin: 11.6 g/dL — ABNORMAL LOW (ref 12.0–15.0)
Immature Granulocytes: 0 %
Lymphocytes Relative: 23 %
Lymphs Abs: 2.1 10*3/uL (ref 0.7–4.0)
MCH: 33.2 pg (ref 26.0–34.0)
MCHC: 33.3 g/dL (ref 30.0–36.0)
MCV: 99.7 fL (ref 80.0–100.0)
Monocytes Absolute: 1.1 10*3/uL — ABNORMAL HIGH (ref 0.1–1.0)
Monocytes Relative: 12 %
Neutro Abs: 5.5 10*3/uL (ref 1.7–7.7)
Neutrophils Relative %: 61 %
Platelets: 494 10*3/uL — ABNORMAL HIGH (ref 150–400)
RBC: 3.49 MIL/uL — ABNORMAL LOW (ref 3.87–5.11)
RDW: 13.4 % (ref 11.5–15.5)
WBC: 9 10*3/uL (ref 4.0–10.5)
nRBC: 0 % (ref 0.0–0.2)

## 2022-03-26 LAB — FERRITIN: Ferritin: 71 ng/mL (ref 11–307)

## 2022-03-26 LAB — IRON AND TIBC
Iron: 145 ug/dL (ref 28–170)
Saturation Ratios: 40 % — ABNORMAL HIGH (ref 10.4–31.8)
TIBC: 367 ug/dL (ref 250–450)
UIBC: 222 ug/dL

## 2022-03-26 LAB — BASIC METABOLIC PANEL
Anion gap: 6 (ref 5–15)
BUN: 23 mg/dL (ref 8–23)
CO2: 27 mmol/L (ref 22–32)
Calcium: 8.8 mg/dL — ABNORMAL LOW (ref 8.9–10.3)
Chloride: 105 mmol/L (ref 98–111)
Creatinine, Ser: 1.51 mg/dL — ABNORMAL HIGH (ref 0.44–1.00)
GFR, Estimated: 38 mL/min — ABNORMAL LOW (ref >60)
Glucose, Bld: 95 mg/dL (ref 70–99)
Potassium: 4.2 mmol/L (ref 3.5–5.1)
Sodium: 138 mmol/L (ref 135–145)

## 2022-03-26 LAB — LACTATE DEHYDROGENASE: LDH: 151 U/L (ref 98–192)

## 2022-03-26 LAB — VITAMIN B12: Vitamin B-12: 664 pg/mL (ref 180–914)

## 2022-03-28 ENCOUNTER — Other Ambulatory Visit: Payer: Self-pay | Admitting: Obstetrics and Gynecology

## 2022-03-28 ENCOUNTER — Telehealth: Payer: Self-pay | Admitting: Nurse Practitioner

## 2022-03-28 ENCOUNTER — Telehealth: Payer: Self-pay

## 2022-03-28 ENCOUNTER — Inpatient Hospital Stay (HOSPITAL_BASED_OUTPATIENT_CLINIC_OR_DEPARTMENT_OTHER): Payer: Medicare Other | Admitting: Internal Medicine

## 2022-03-28 ENCOUNTER — Encounter: Payer: Self-pay | Admitting: Internal Medicine

## 2022-03-28 ENCOUNTER — Inpatient Hospital Stay: Payer: Medicare Other

## 2022-03-28 DIAGNOSIS — D649 Anemia, unspecified: Secondary | ICD-10-CM

## 2022-03-28 DIAGNOSIS — K8681 Exocrine pancreatic insufficiency: Secondary | ICD-10-CM

## 2022-03-28 DIAGNOSIS — D75839 Thrombocytosis, unspecified: Secondary | ICD-10-CM | POA: Diagnosis not present

## 2022-03-28 DIAGNOSIS — Z79899 Other long term (current) drug therapy: Secondary | ICD-10-CM | POA: Diagnosis not present

## 2022-03-28 DIAGNOSIS — Z1231 Encounter for screening mammogram for malignant neoplasm of breast: Secondary | ICD-10-CM

## 2022-03-28 DIAGNOSIS — N183 Chronic kidney disease, stage 3 unspecified: Secondary | ICD-10-CM | POA: Diagnosis not present

## 2022-03-28 DIAGNOSIS — E538 Deficiency of other specified B group vitamins: Secondary | ICD-10-CM | POA: Diagnosis not present

## 2022-03-28 DIAGNOSIS — R634 Abnormal weight loss: Secondary | ICD-10-CM

## 2022-03-28 DIAGNOSIS — Z7982 Long term (current) use of aspirin: Secondary | ICD-10-CM | POA: Diagnosis not present

## 2022-03-28 DIAGNOSIS — D519 Vitamin B12 deficiency anemia, unspecified: Secondary | ICD-10-CM

## 2022-03-28 DIAGNOSIS — D631 Anemia in chronic kidney disease: Secondary | ICD-10-CM | POA: Diagnosis not present

## 2022-03-28 LAB — PANCREATIC ELASTASE, FECAL: Pancreatic Elastase, Fecal: 62 ug Elast./g — ABNORMAL LOW (ref >200)

## 2022-03-28 MED ORDER — CYANOCOBALAMIN 1000 MCG/ML IJ SOLN
1000.0000 ug | Freq: Once | INTRAMUSCULAR | Status: AC
  Administered 2022-03-28: 1000 ug via INTRAMUSCULAR
  Filled 2022-03-28: qty 1

## 2022-03-28 NOTE — Patient Instructions (Signed)
#  Recommend gentle iron 1 pill a day; should not upset your stomach or cause constipation.  Talk to the pharmacist if you can find it/it is over-the-counter.  

## 2022-03-28 NOTE — Telephone Encounter (Signed)
Patient is okay with scheduling the CT scan. Order the CT scan schedule and schedule patient for 04/06/2022 at 8:30am. Need to arrive at 8:00am. Will need to pick up the contrast before the scan. Nothing to eat or or drink 4 hours before the scan . Called patient and gave the instructions to patient.

## 2022-03-28 NOTE — Progress Notes (Signed)
Has had issues with her pancreas and stomach, being treated for byDr [facility].

## 2022-03-28 NOTE — Assessment & Plan Note (Addendum)
#  Anemia/CKD-III]- Hb- June 2023 Hemoglobin 11.2  iron saturation 41 % ferritin-71-patient is symptomatic from fatigue; but improved. Continue PO iron. Discussed re: gentle iron. HOLD off IV iron.  # B12 deficiency [MAY 2022- H41-937]TKWI 2023- 097 proceed with B12 injection q 67M;   Continue B12 oral at home.   #  Chronicthrombocytosis and monocytosis [since 2012].  myeloproliferative disorder such as CMML without current need for treatment. She has a mild normocytic anemia. April 2022-bone marrow biopsy-no acute process.   # CKD-III [Dr.Murphy, nephrology]-lupus [1987] versus others defer to nephrology- STABLE  # Malabsorbtion sec to pancreas- [on Vanga]- on creon-stable.  # DISPOSITION: # B12 injection today # follow up in 3 months- B12 injection # follow up in 6 months- MD; 2-3 days prior-labs-cbc/bmp/ iron studies/ferritin; LDH-B12 levels.  Possible B12 injection-Dr.B

## 2022-03-28 NOTE — Progress Notes (Signed)
[facility] [facility] NOTE  Patient Care Team: Jon Billings, NP as PCP - General Kem Boroughs, FNP as Nurse Practitioner (Family Medicine) Christene Lye, MD (General Surgery) Emmaline Kluver., MD (Rheumatology) Yisroel Ramming, Everardo All, MD as Consulting Physician (Obstetrics and Gynecology) Cammie Sickle, MD as Consulting Physician (Hematology and Oncology)  CHIEF COMPLAINTS/PURPOSE OF CONSULTATION: Anemia  #chronic  Anemia/CKD-III-IDA  # #B12 deficiency  #  chronic thrombocytosis and monocytosis [since 2012].   myeloproliferative disorder such as CMML- April 2022-bone marrow biopsy-no acute process- likely reactive MPN- work up NEGATIVE.  # CKD-III [Dr.Murphy, nephrology]-lupus versus others- [Dr.Murphy]    Oncology History   No history exists.   HISTORY OF PRESENTING ILLNESS: Alone.  Ambulating independently.  [name] 66 y.o.  female with a history of chronic anemia/chronic mild monocytosis/thrombocytosis is here for follow-up.  Has had issues with her pancreas and stomach, being treated for byDr [facility]. s/p colonoscopy/EGD. ? Food allergies. On creon- improved.   Denies any blood in stools or black or stools. no Nausea vomiting.  Continues to complain of fatigue.  Review of Systems  Constitutional:  Positive for malaise/fatigue. Negative for chills, diaphoresis and fever.  HENT:  Negative for nosebleeds and sore throat.   Eyes:  Negative for double vision.  Respiratory:  Negative for cough, hemoptysis, sputum production, shortness of breath and wheezing.   Cardiovascular:  Negative for chest pain, palpitations, orthopnea and leg swelling.  Gastrointestinal:  Negative for abdominal pain, blood in stool, constipation, diarrhea, heartburn, melena, nausea and vomiting.  Genitourinary:  Negative for dysuria, frequency and urgency.  Musculoskeletal:  Positive for back pain and joint pain.  Skin: Negative.   Negative for itching and rash.  Neurological:  Negative for dizziness, tingling, focal weakness, weakness and headaches.  Endo/Heme/Allergies:  Does not bruise/bleed easily.  Psychiatric/Behavioral:  Negative for depression. The patient is not nervous/anxious and does not have insomnia.     MEDICAL HISTORY:  Past Medical History:  Diagnosis Date   Abnormal pap    Anemia    Anxiety    History of lupus 1987   Hypertension    Low vitamin D level    Renal insufficiency 1988   kidney biopsy 1988 and 1998- "my kidneys function at like 48%"    SURGICAL HISTORY: Past Surgical History:  Procedure Laterality Date   BONE MARROW BIOPSY  01/2021   BREAST BIOPSY Right 07/08/2017   BREAST EXCISIONAL BIOPSY Right 08/06/2017   BREAST EXCISIONAL BIOPSY Right 07/1999   BREAST LUMPECTOMY WITH RADIOACTIVE SEED LOCALIZATION Right 08/06/2017   Procedure: RIGHT BREAST LUMPECTOMY WITH RADIOACTIVE SEED LOCALIZATION ERAS PATHWAY;  Surgeon: Excell Seltzer, MD;  Location: [place];  Service: General;  Laterality: Right;   BREAST LUMPECTOMY WITH RADIOACTIVE SEED LOCALIZATION Right 08/19/2018   Procedure: RIGHT BREAST LUMPECTOMY [place];  Surgeon: Excell Seltzer, MD;  Location: [place];  Service: General;  Laterality: Right;   BREAST SURGERY  07/1999   right breast benign fibroadenoma   COLONOSCOPY  2012   COLONOSCOPY WITH PROPOFOL N/A 05/01/2017   Procedure: COLONOSCOPY WITH PROPOFOL;  Surgeon: Christene Lye, MD;  Location: [facility] ENDOSCOPY;  Service: Endoscopy;  Laterality: N/A;   COLONOSCOPY WITH PROPOFOL N/A 12/18/2021   Procedure: COLONOSCOPY WITH PROPOFOL;  Surgeon: Lin Landsman, MD;  Location: [facility] ENDOSCOPY;  Service: Gastroenterology;  Laterality: N/A;   CRYOTHERAPY     abnormal pap smear   ESOPHAGOGASTRODUODENOSCOPY (EGD) WITH  PROPOFOL N/A 05/01/2017   Procedure: ESOPHAGOGASTRODUODENOSCOPY (EGD) WITH PROPOFOL;  Surgeon:  Christene Lye, MD;  Location: [facility] ENDOSCOPY;  Service: Endoscopy;  Laterality: N/A;   ESOPHAGOGASTRODUODENOSCOPY (EGD) WITH PROPOFOL N/A 12/18/2021   Procedure: ESOPHAGOGASTRODUODENOSCOPY (EGD) WITH PROPOFOL;  Surgeon: Lin Landsman, MD;  Location: [facility] ENDOSCOPY;  Service: Gastroenterology;  Laterality: N/A;   NASAL SEPTUM SURGERY  2007   TUBAL LIGATION  1990    SOCIAL HISTORY: Social History   Socioeconomic History   Marital status: Widowed    Spouse name: Not on file   Number of children: Not on file   Years of education: Not on file   Highest education level: Not on file  Occupational History   Not on file  Tobacco Use   Smoking status: Never   Smokeless tobacco: Never  Vaping Use   Vaping Use: Never used  Substance and Sexual Activity   Alcohol use: Yes    Alcohol/week: 4.0 - 6.0 standard drinks    Types: 4 - 6 Standard drinks or equivalent per week    Comment: few beers on friday and saturday   Drug use: Yes    Types: Marijuana    Comment: few times a month    Sexual activity: Yes    Partners: Male    Birth control/protection: Surgical, Post-menopausal    Comment: BTL  Other Topics Concern   Not on file  Social History Narrative   Husband died 16-Dec-2016 from lung cancer at age 82 yrs old.     Social Determinants of Health   Financial Resource Strain: Low Risk    Difficulty of Paying Living Expenses: Not very hard  Food Insecurity: No Food Insecurity   Worried About Charity fundraiser in the Last Year: Never true   Ran Out of Food in the Last Year: Never true  Transportation Needs: Not on file  Physical Activity: Not on file  Stress: No Stress Concern Present   Feeling of Stress : Only a little  Social Connections: Moderately Integrated   Frequency of Communication with Friends and Family: Three times a week   Frequency of Social Gatherings with Friends and Family: More than three times a week   Attends Religious Services: More than 4 times  per year   Active Member of Clubs or Organizations: Yes   Attends Archivist Meetings: 1 to 4 times per year   Marital Status: Widowed  Human resources officer Violence: Not on file    FAMILY HISTORY: Family History  Problem Relation Age of Onset   Cancer Father        oral cancer   Cancer Maternal Grandmother        ovarian cancer   Hypertension Maternal Grandmother    Diabetes Maternal Grandfather    Diabetes Paternal Aunt    Breast cancer Neg Hx     ALLERGIES:  is allergic to elemental sulfur, sulfa antibiotics, beef-derived products, lambs quarters, and milk-related compounds.  MEDICATIONS:  Current Outpatient Medications  Medication Sig Dispense Refill   aspirin 81 MG tablet Take 81 mg by mouth daily.     chlorthalidone (HYGROTON) 25 MG tablet Take 12.5 mg by mouth in the morning.     Cholecalciferol (VITAMIN D3 PO) Take 1 tablet by mouth daily.      cyanocobalamin 1000 MCG tablet Take 1,000 mcg by mouth daily. Vitamin B12     enalapril (VASOTEC) 20 MG tablet Take 20 mg by mouth 2 (two) times daily.  Ferrous Sulfate (IRON) 325 (65 Fe) MG TABS Take by mouth.     hydroxychloroquine (PLAQUENIL) 200 MG tablet Take 200 mg by mouth every other day.      lipase/protease/amylase (CREON) 36000 UNITS CPEP capsule Take 2 capsules with the first bite of each meal and 1 capsule with the first bite of each snack 240 capsule 2   meclizine (ANTIVERT) 12.5 MG tablet Take 1 tablet (12.5 mg total) by mouth 3 (three) times daily as needed for dizziness. 30 tablet 0   traMADol (ULTRAM) 50 MG tablet Take 50 mg by mouth every 6 (six) hours as needed for moderate pain.     traZODone (DESYREL) 50 MG tablet TAKE 1 TABLET BY MOUTH EVERY DAY AT BEDTIME AS NEEDED FOR SLEEP 90 tablet 0   No current facility-administered medications for this visit.      Marland Kitchen  PHYSICAL EXAMINATION: ECOG PERFORMANCE STATUS: 1 - Symptomatic but completely ambulatory  Vitals:   03/28/22 0953  BP: 125/68  Pulse:  74  Temp: 98.1 F (36.7 C)  SpO2: 100%   Filed Weights   03/28/22 0953  Weight: 180 lb 9.6 oz (81.9 kg)    Physical Exam Vitals and nursing note reviewed.  Constitutional:      Comments:     HENT:     Head: Normocephalic and atraumatic.     Mouth/Throat:     Mouth: Mucous membranes are moist.     Pharynx: No oropharyngeal exudate.  Eyes:     Pupils: Pupils are equal, round, and reactive to light.  Cardiovascular:     Rate and Rhythm: Normal rate and regular rhythm.  Pulmonary:     Effort: No respiratory distress.     Breath sounds: No wheezing.     Comments: Decreased breath sounds bilaterally at bases.  No wheeze or crackles Abdominal:     General: Bowel sounds are normal. There is no distension.     Palpations: Abdomen is soft. There is no mass.     Tenderness: There is no abdominal tenderness. There is no guarding or rebound.  Musculoskeletal:        General: No tenderness. Normal range of motion.     Cervical back: Normal range of motion and neck supple.  Skin:    General: Skin is warm.  Neurological:     Mental Status: She is alert and oriented to person, place, and time.  Psychiatric:        Mood and Affect: Affect normal.        Judgment: Judgment normal.    LABORATORY DATA:  I have reviewed the data as listed Lab Results  Component Value Date   WBC 9.0 03/26/2022   HGB 11.6 (L) 03/26/2022   HCT 34.8 (L) 03/26/2022   MCV 99.7 03/26/2022   PLT 494 (H) 03/26/2022   Recent Labs    04/06/21 1153 06/12/21 0954 09/25/21 1425 03/26/22 1038  NA 136 134* 136 138  K 4.8 5.3* 4.4 4.2  CL 103 101 103 105  CO2 _0 GLUCOSE 94 82 89 95  BUN 29* 29* 30* 23  CREATININE 1.28* 1.46* 1.21* 1.51*  CALCIUM 9.4 9.0 9.0 8.8*  GFRNONAA 46* 39* 49* 38*  PROT 7.6 7.3  --   --   ALBUMIN 4.3 4.0  --   --   AST 23 19  --   --   ALT 13 10  --   --   ALKPHOS 75 71  --   --  BILITOT 0.7 0.7  --   --     RADIOGRAPHIC STUDIES: I have personally reviewed  the radiological images as listed and agreed with the findings in the report. No results found.  ASSESSMENT & PLAN:   Symptomatic anemia #Anemia/CKD-III]- Hb- June 2023 Hemoglobin 11.2  iron saturation 41 % ferritin-71-patient is symptomatic from fatigue; but improved. Continue PO iron. Discussed re: gentle iron. HOLD off IV iron.  # B12 deficiency [MAY 2022- P01-410]VUDT 2023- 143 proceed with B12 injection q 55M;   Continue B12 oral at home.   #  Chronic thrombocytosis and monocytosis [since 2012].   myeloproliferative disorder such as CMML without current need for treatment.  She has a mild normocytic anemia. April 2022-bone marrow biopsy-no acute process.  #   # CKD-III [Dr.Murphy, nephrology]-lupus [1987] versus others defer to nephrology- STABLE  # Malabsorbtion sec to pancreas- [on Vanga]- on creon-   # DISPOSITION: # B12 injection today # follow up in 3 months- B12 injection # follow up in 6 months- MD; 2-3 days prior-labs-cbc/bmp/ iron studies/ferritin; LDH-B12 levels.  Possible B12 injection-Dr.B  All questions were answered. The patient knows to call the clinic with any problems, questions or concerns.    Cammie Sickle, MD 03/28/2022 10:55 AM

## 2022-03-28 NOTE — Telephone Encounter (Signed)
Copied from [place] [id number]. Topic: General - Other >> Mar 28, 2022 11:21 AM Yvette Rack wrote: Reason for CRM: Pt stated she know she is suppose to have a bone density test but she does not know where or when it is suppose to be done. Pt requests call back to advise. Cb# [phone]

## 2022-03-28 NOTE — Telephone Encounter (Signed)
-----   Message from Lin Landsman, MD sent at 03/28/2022  4:23 PM EDT ----- Repeat pancreatic fecal elastase levels are low.  Therefore, recommend CT abdomen and pelvis pancreas protocol Dx: Exocrine pancreatic insufficiency, unexplained weight loss  Rohini Vanga

## 2022-03-29 NOTE — Telephone Encounter (Signed)
Patient can call [place] and get this scheduled.  Please call to schedule your mammogram and/or bone density: [facility]  Address: [address], [place], [place] [address] Phone: [phone]

## 2022-04-02 NOTE — Telephone Encounter (Signed)
Patient is scheduled for Mammogram is scheduled on 07/11/22.

## 2022-04-06 ENCOUNTER — Ambulatory Visit
Admission: RE | Admit: 2022-04-06 | Discharge: 2022-04-06 | Disposition: A | Discharge status: Home / Self Care | Payer: Medicare Other | Source: Ambulatory Visit | Attending: Gastroenterology | Admitting: Gastroenterology

## 2022-04-06 DIAGNOSIS — K8681 Exocrine pancreatic insufficiency: Secondary | ICD-10-CM | POA: Insufficient documentation

## 2022-04-06 DIAGNOSIS — R634 Abnormal weight loss: Secondary | ICD-10-CM | POA: Insufficient documentation

## 2022-04-06 DIAGNOSIS — N281 Cyst of kidney, acquired: Secondary | ICD-10-CM | POA: Diagnosis not present

## 2022-04-06 LAB — POCT I-STAT CREATININE: Creatinine, Ser: 1.5 mg/dL — ABNORMAL HIGH (ref 0.44–1.00)

## 2022-04-06 MED ORDER — IOHEXOL 300 MG/ML  SOLN
80.0000 mL | Freq: Once | INTRAMUSCULAR | Status: AC | PRN
  Administered 2022-04-06: 80 mL via INTRAVENOUS

## 2022-04-09 ENCOUNTER — Telehealth: Payer: Self-pay

## 2022-04-09 NOTE — Telephone Encounter (Signed)
-----   Message from Lin Landsman, MD sent at 04/09/2022  1:27 PM EDT ----- Please schedule follow-up in 6 months DX: EPI

## 2022-04-09 NOTE — Telephone Encounter (Signed)
Put in a recall for 6 months

## 2022-04-10 DIAGNOSIS — M40292 Other kyphosis, cervical region: Secondary | ICD-10-CM | POA: Diagnosis not present

## 2022-04-10 DIAGNOSIS — M9901 Segmental and somatic dysfunction of cervical region: Secondary | ICD-10-CM | POA: Diagnosis not present

## 2022-04-10 DIAGNOSIS — M542 Cervicalgia: Secondary | ICD-10-CM | POA: Diagnosis not present

## 2022-04-10 DIAGNOSIS — M9902 Segmental and somatic dysfunction of thoracic region: Secondary | ICD-10-CM | POA: Diagnosis not present

## 2022-04-11 ENCOUNTER — Ambulatory Visit: Payer: Medicare Other | Admitting: Obstetrics and Gynecology

## 2022-04-13 ENCOUNTER — Telehealth: Payer: Self-pay

## 2022-04-13 NOTE — Telephone Encounter (Signed)
Patient is calling because she states she had a ct scan and it came back normal and was told to follow up in 6 months. She is not wanting to wait that long because she is worried why her Pancreatic Elastase fecal test keeps dropping. She wants to know what is causing this.

## 2022-04-17 NOTE — Telephone Encounter (Signed)
Patient states that she does not feel she needs to wait 6 months before coming to see you because we do not know what is cause the Pancreatic Enzymes insufficiency. She states she understand that her pancrease is normal but what are other causes for this that she could have. She also wants to know how is all of the sudden she is allergic to milk, beef and lamb and has not been till now. Patient would really not want to wait till 6 months to see you and wants to know answers she says

## 2022-05-03 ENCOUNTER — Ambulatory Visit: Payer: Medicare Other | Admitting: Gastroenterology

## 2022-05-03 ENCOUNTER — Encounter: Payer: Self-pay | Admitting: Gastroenterology

## 2022-05-03 VITALS — BP 130/71 | HR 106 | Temp 97.8°F | Ht 65.0 in | Wt 184.0 lb

## 2022-05-03 DIAGNOSIS — K8681 Exocrine pancreatic insufficiency: Secondary | ICD-10-CM

## 2022-05-03 NOTE — Patient Instructions (Signed)
Cystic Fibrosis Panel and Alpha 1 Antitrypsin Deficiency

## 2022-05-03 NOTE — Progress Notes (Signed)
Cephas Darby, MD [address]  [place]  [place], [place] [address]  Main: [phone]  Fax: [phone]    Gastroenterology Consultation  Referring Provider:     Jon Billings, NP Primary Care Physician:  Jon Billings, NP Primary Gastroenterologist:  Dr. Cephas Darby Reason for Consultation: Exocrine pancreatic insufficiency        HPI:   [name] is a 66 y.o. female referred by Dr. Jon Billings, NP  for consultation & management of chronic symptoms of sporadic episodes of postprandial urgency, nonbloody diarrhea.  She reports that she has been having the symptoms for almost a year.  Patient denies any nausea, vomiting, abdominal pain, weight loss or loss of appetite.  She does state that she makes her coffee "blonde " by adding creamer.  She does consume sweet tea, 2 cups daily.  She does report abdominal bloating.  Patient has history of stage II-III CKD, lupus, anemia of chronic disease.  Follow-up visit 03/12/2022 Patient is here for follow-up of postprandial urgency and diarrhea.  She underwent repeat upper endoscopy and colonoscopy including biopsies which were unremarkable.  Her pancreatic fecal elastase levels were low at 117, alpha gal panel was abnormal for beef and lamb IgE levels, food allergy profile was abnormal for milk IgE levels.  Therefore, I have advised patient to eliminate these food products as well as start pancreatic enzyme replacement therapy.  She is currently on Creon and awaiting for patient assistance program for long-term maintenance therapy.  Patient reports that when she went off of milk, beef and lamb for 1 month, she felt significantly better.  She started reintroducing these foods with Creon and noticed that she is able to tolerate better without much GI symptoms.  She currently ran out of Creon.  Follow-up visit 05/03/2022 Patient made a sooner appointment in order to discuss more about pancreatic insufficiency.   Patient is currently on Creon 72 K 2 capsules with each meal and 1 with snack.  Patient is gaining weight.  She has significantly cut back on creamer with coffee.  She is unable to stay away from red meat and has somewhat reintroduced.  She is not experiencing diarrheal episodes as before.  She had questions regarding etiology of pancreatic insufficiency.  She underwent CT abdomen and pelvis which was unremarkable.  Patient does not smoke or drink alcohol  NSAIDs: None  Antiplts/Anticoagulants/Anti thrombotics: None  GI Procedures:  EGD and colonoscopy 12/18/2021 for chronic diarrhea - Normal duodenal bulb and second portion of the duodenum. Biopsied. - Normal stomach. Biopsied. - Z-line irregular, 40 cm from the incisors. - Normal gastroesophageal junction and esophagus.  - The examined portion of the ileum was normal. - Normal mucosa in the entire examined colon. Biopsied. - Severe diverticulosis in the recto-sigmoid colon, in the sigmoid colon, in the descending colon and in the transverse colon. There was no evidence of diverticular bleeding. - The distal rectum and anal verge are normal on retroflexion view.  DIAGNOSIS:  A.  DUODENUM; COLD BIOPSY:  - DUODENAL MUCOSA WITH INTACT VILLI.  - NEGATIVE FOR ACTIVE INFLAMMATION, INTRAEPITHELIAL LYMPHOCYTOSIS, AND  INFECTIOUS AGENTS.   B.  STOMACH; RANDOM COLD BIOPSY:  - ANTRAL-TYPE MUCOSA WITH REACTIVE GASTROPATHY.  - NEGATIVE FOR H. PYLORI, INTESTINAL METAPLASIA, DYSPLASIA, AND  MALIGNANCY.   C.  COLON; RANDOM COLD BIOPSY:  - COLONIC MUCOSA WITH INTACT CRYPT ARCHITECTURE.  - LYMPHOID AGGREGATES WITH REACTIVE GERMINAL CENTERS.  - ONE EARLY APHTHOID LESION, SEE COMMENT.  EGD and colonoscopy 05/01/2017 - Normal examined duodenum. - Normal stomach. - Esophageal mucosal changes suspicious for short-segment Barrett's esophagus. Biopsied. - The examination was otherwise normal.  - Diverticulosis in the sigmoid colon, in the  descending colon and in the transverse colon. - One 5 mm polyp at the recto-sigmoid colon, removed with a hot snare. Resected and retrieved. - The examination was otherwise normal.  DIAGNOSIS:  A. GE JUNCTION; COLD BIOPSY:  - COLUMNAR MUCOSA WITH INTESTINAL METAPLASIA.  - NEGATIVE FOR DYSPLASIA AND MALIGNANCY.   Note: If a characteristic lesion was identified endoscopically within  the GE junction, the histologic findings would fulfill criteria for  Barrett's esophagus.   B. COLON POLYP, RECTOSIGMOID; HOT SNARE:  - TUBULAR ADENOMA.  - NEGATIVE FOR HIGH-GRADE DYSPLASIA AND MALIGNANCY.  Past Medical History:  Diagnosis Date   Abnormal pap    Anemia    Anxiety    History of lupus 1987   Hypertension    Low vitamin D level    Renal insufficiency 1988   kidney biopsy 1988 and 1998- "my kidneys function at like 48%"    Past Surgical History:  Procedure Laterality Date   BONE MARROW BIOPSY  01/2021   BREAST BIOPSY Right 07/08/2017   BREAST EXCISIONAL BIOPSY Right 08/06/2017   BREAST EXCISIONAL BIOPSY Right 07/1999   BREAST LUMPECTOMY WITH RADIOACTIVE SEED LOCALIZATION Right 08/06/2017   Procedure: RIGHT BREAST LUMPECTOMY WITH RADIOACTIVE SEED LOCALIZATION [facility] PATHWAY;  Surgeon: Excell Seltzer, MD;  Location: [facility];  Service: General;  Laterality: Right;   BREAST LUMPECTOMY WITH RADIOACTIVE SEED LOCALIZATION Right 08/19/2018   Procedure: RIGHT BREAST LUMPECTOMY [place];  Surgeon: Excell Seltzer, MD;  Location: [facility];  Service: General;  Laterality: Right;   BREAST SURGERY  07/1999   right breast benign fibroadenoma   COLONOSCOPY  2012   COLONOSCOPY WITH PROPOFOL N/A 05/01/2017   Procedure: COLONOSCOPY WITH PROPOFOL;  Surgeon: Christene Lye, MD;  Location: [facility] ENDOSCOPY;  Service: Endoscopy;  Laterality: N/A;   COLONOSCOPY WITH PROPOFOL N/A 12/18/2021   Procedure: COLONOSCOPY WITH PROPOFOL;  Surgeon:  Lin Landsman, MD;  Location: [facility] ENDOSCOPY;  Service: Gastroenterology;  Laterality: N/A;   CRYOTHERAPY     abnormal pap smear   ESOPHAGOGASTRODUODENOSCOPY (EGD) WITH PROPOFOL N/A 05/01/2017   Procedure: ESOPHAGOGASTRODUODENOSCOPY (EGD) WITH PROPOFOL;  Surgeon: Christene Lye, MD;  Location: [facility] ENDOSCOPY;  Service: Endoscopy;  Laterality: N/A;   ESOPHAGOGASTRODUODENOSCOPY (EGD) WITH PROPOFOL N/A 12/18/2021   Procedure: ESOPHAGOGASTRODUODENOSCOPY (EGD) WITH PROPOFOL;  Surgeon: Lin Landsman, MD;  Location[facility];  Service: Gastroenterology;  Laterality: N/A;   NASAL SEPTUM SURGERY  2007   TUBAL LIGATION  1990    Current Outpatient Medications:    aspirin 81 MG tablet, Take 81 mg by mouth daily., Disp: , Rfl:    chlorthalidone (HYGROTON) 25 MG tablet, Take 12.5 mg by mouth in the morning., Disp: , Rfl:    Cholecalciferol (VITAMIN D3 PO), Take 1 tablet by mouth daily. , Disp: , Rfl:    cyanocobalamin 1000 MCG tablet, Take 1,000 mcg by mouth daily. Vitamin B12, Disp: , Rfl:    enalapril (VASOTEC) 20 MG tablet, Take 20 mg by mouth 2 (two) times daily., Disp: , Rfl:    Ferrous Sulfate (IRON) 325 (65 Fe) MG TABS, Take by mouth., Disp: , Rfl:    hydroxychloroquine (PLAQUENIL) 200 MG tablet, Take 200 mg by mouth every other day. , Disp: , Rfl:    lipase/protease/amylase (  CREON) 36000 UNITS CPEP capsule, Take 2 capsules with the first bite of each meal and 1 capsule with the first bite of each snack, Disp: 240 capsule, Rfl: 2   meclizine (ANTIVERT) 12.5 MG tablet, Take 1 tablet (12.5 mg total) by mouth 3 (three) times daily as needed for dizziness., Disp: 30 tablet, Rfl: 0   traMADol (ULTRAM) 50 MG tablet, Take 50 mg by mouth every 6 (six) hours as needed for moderate pain., Disp: , Rfl:    traZODone (DESYREL) 50 MG tablet, TAKE 1 TABLET BY MOUTH EVERY DAY AT BEDTIME AS NEEDED FOR SLEEP, Disp: 90 tablet, Rfl: 0    Family History  Problem Relation Age of Onset    Cancer Father        oral cancer   Cancer Maternal Grandmother        ovarian cancer   Hypertension Maternal Grandmother    Diabetes Maternal Grandfather    Diabetes Paternal Aunt    Breast cancer Neg Hx      Social History   Tobacco Use   Smoking status: Never   Smokeless tobacco: Never  Vaping Use   Vaping Use: Never used  Substance Use Topics   Alcohol use: Yes    Alcohol/week: 4.0 - 6.0 standard drinks of alcohol    Types: 4 - 6 Standard drinks or equivalent per week    Comment: few beers on friday and saturday   Drug use: Yes    Types: Marijuana    Comment: few times a month     Allergies as of 05/03/2022 - Review Complete 05/03/2022  Allergen Reaction Noted   Elemental sulfur Hives 02/04/2013   Sulfa antibiotics Hives, Itching, and Rash 03/11/2013   Beef-derived products  03/28/2022   Lambs quarters  03/28/2022   Milk-related compounds  03/28/2022    Review of Systems:    All systems reviewed and negative except where noted in HPI.   Physical Exam:  BP 130/71 (BP Location: Left Arm, Patient Position: Sitting, Cuff Size: Normal)   Pulse (!) 106   Temp 97.8 F (36.6 C) (Oral)   Ht _0  (1.651 m)   Wt 184 lb (83.5 kg)   LMP 08/22/2008   BMI 30.62 kg/m  Patient's last menstrual period was 08/22/2008.  General:   Alert,  Well-developed, well-nourished, pleasant and cooperative in NAD Head:  Normocephalic and atraumatic. Eyes:  Sclera clear, no icterus.   Conjunctiva pink. Ears:  Normal auditory acuity. Nose:  No deformity, discharge, or lesions. Mouth:  No deformity or lesions,oropharynx pink & moist. Neck:  Supple; no masses or thyromegaly. Lungs:  Respirations even and unlabored.  Clear throughout to auscultation.   No wheezes, crackles, or rhonchi. No acute distress. Heart:  Regular rate and rhythm; no murmurs, clicks, rubs, or gallops. Abdomen:  Normal bowel sounds. Soft, non-tender and non-distended without masses, hepatosplenomegaly or hernias  noted.  No guarding or rebound tenderness.   Rectal: Not performed Msk:  Symmetrical without gross deformities. Good, equal movement & strength bilaterally. Pulses:  Normal pulses noted. Extremities:  No clubbing or edema.  No cyanosis. Neurologic:  Alert and oriented x3;  grossly normal neurologically. Skin:  Intact without significant lesions or rashes. No jaundice. Psych:  Alert and cooperative. Normal mood and affect.  Imaging Studies: Reviewed  Assessment and Plan:   [name] is a 66 y.o. female with history of CKD, lupus, hypertension, mild iron deficiency anemia is seen in for chronic symptoms of postprandial urgency, diarrhea and  abdominal bloating, found to have alpha gal as well as intolerance to milk, moderate pancreatic insufficiency.  Patient reports symptomatic improvement with avoidance of beef, lamb as well as milk.  However, she is taking Creon which allowed her to reintroduce these foods without much GI symptoms.  Moderate to severe exocrine pancreatic insufficiency Discussed with patient regarding long-term management of EPI, etiology.  Primary pancreatic pathology has been ruled out based on the CT scan Discussed about cystic fibrosis panel and alpha-1 antitrypsin deficiency.  Patient would like to check with insurance about the coverage for these labs before undergoing.  Normal serum ferritin levels Advised patient to continue Creon 36 K 3 capsules with each meal 1-2 with snack Reiterated regarding low-carb, low-fat, high-protein diet.  Reiterated regarding consumption of lean meat only   Follow up in 6 months   Cephas Darby, MD

## 2022-05-07 ENCOUNTER — Telehealth: Payer: Self-pay

## 2022-05-07 DIAGNOSIS — K8681 Exocrine pancreatic insufficiency: Secondary | ICD-10-CM

## 2022-05-07 NOTE — Telephone Encounter (Signed)
Patient states she called her insurance company and her insurance will cover the Cystic Fibrosis panel and Alpha 1 Antitrypsin deficiency. Can you please order the lab for Cystic fibrosis because I do not know which one you will want

## 2022-05-08 ENCOUNTER — Other Ambulatory Visit: Payer: Self-pay | Admitting: Gastroenterology

## 2022-05-08 DIAGNOSIS — M9901 Segmental and somatic dysfunction of cervical region: Secondary | ICD-10-CM | POA: Diagnosis not present

## 2022-05-08 DIAGNOSIS — M542 Cervicalgia: Secondary | ICD-10-CM | POA: Diagnosis not present

## 2022-05-08 DIAGNOSIS — M9902 Segmental and somatic dysfunction of thoracic region: Secondary | ICD-10-CM | POA: Diagnosis not present

## 2022-05-08 DIAGNOSIS — M40292 Other kyphosis, cervical region: Secondary | ICD-10-CM | POA: Diagnosis not present

## 2022-05-08 DIAGNOSIS — K8681 Exocrine pancreatic insufficiency: Secondary | ICD-10-CM | POA: Diagnosis not present

## 2022-05-08 NOTE — Telephone Encounter (Signed)
Orders placed, please release  RV

## 2022-05-08 NOTE — Telephone Encounter (Signed)
Informed patient of the labs

## 2022-05-09 LAB — CFTR DELETION/DUPLICATION

## 2022-05-14 LAB — ALPHA-1-ANTITRYPSIN: A-1 Antitrypsin: 168 mg/dL (ref 101–187)

## 2022-05-14 LAB — REQUEST PROBLEM

## 2022-05-28 ENCOUNTER — Telehealth: Payer: Self-pay

## 2022-05-28 NOTE — Telephone Encounter (Signed)
Patient is calling about the results of her lab work that she had done on 05/08/2022. Called patient and informed patient that the lab work is still pending . Informed patient of this information

## 2022-06-05 DIAGNOSIS — M542 Cervicalgia: Secondary | ICD-10-CM | POA: Diagnosis not present

## 2022-06-05 DIAGNOSIS — M9902 Segmental and somatic dysfunction of thoracic region: Secondary | ICD-10-CM | POA: Diagnosis not present

## 2022-06-05 DIAGNOSIS — M40292 Other kyphosis, cervical region: Secondary | ICD-10-CM | POA: Diagnosis not present

## 2022-06-05 DIAGNOSIS — M9901 Segmental and somatic dysfunction of cervical region: Secondary | ICD-10-CM | POA: Diagnosis not present

## 2022-06-15 LAB — NGS CUSTOM PANEL

## 2022-06-15 LAB — SPECIMEN STATUS REPORT

## 2022-06-18 ENCOUNTER — Telehealth: Payer: Self-pay

## 2022-06-18 ENCOUNTER — Telehealth: Payer: Self-pay | Admitting: Internal Medicine

## 2022-06-18 DIAGNOSIS — I10 Essential (primary) hypertension: Secondary | ICD-10-CM | POA: Diagnosis not present

## 2022-06-18 DIAGNOSIS — M3214 Glomerular disease in systemic lupus erythematosus: Secondary | ICD-10-CM | POA: Diagnosis not present

## 2022-06-18 DIAGNOSIS — N1832 Chronic kidney disease, stage 3b: Secondary | ICD-10-CM | POA: Diagnosis not present

## 2022-06-18 DIAGNOSIS — E875 Hyperkalemia: Secondary | ICD-10-CM | POA: Diagnosis not present

## 2022-06-18 NOTE — Telephone Encounter (Signed)
Patient called to verify if she need labs before her b12 injection. Please advise.  Thank you

## 2022-06-18 NOTE — Telephone Encounter (Signed)
Patient left a voicemail on Friday wanted to know about her lab work that she had done in July. Called [facility] to see if it was back and she said they will be faxing [place] the results they finished resulting on 06/15/2022. Called and informed patient she verbalized understanding. She states she is just very concerned

## 2022-06-18 NOTE — Telephone Encounter (Signed)
I see that alpha-1 antitrypsin levels came back normal.  Did we receive a copy of cystic fibrosis panel?  RV

## 2022-06-18 NOTE — Telephone Encounter (Signed)
Patient left VM requesting 9/7 B12 inj be moved to an earlier time, no labs needed.

## 2022-06-19 ENCOUNTER — Encounter: Payer: Self-pay | Admitting: Gastroenterology

## 2022-06-19 NOTE — Telephone Encounter (Signed)
Called customer service and they said that the Cystic fibrosis test is back and they state they are faxing [place] the result. We did receive the results and Katha Cabal is scanning the results in epic

## 2022-06-20 ENCOUNTER — Telehealth: Payer: Self-pay

## 2022-06-20 NOTE — Telephone Encounter (Signed)
-----   Message from Lin Landsman, MD sent at 06/19/2022 10:20 PM EDT ----- Please inform patient that the cystic fibrosis panel also came back normal.  Alpha-1 antitrypsin levels also came back normal  RV ----- Message ----- From: Shelby Mattocks, CMA Sent: 06/19/2022  10:23 AM EDT To: Lin Landsman, MD

## 2022-06-20 NOTE — Telephone Encounter (Signed)
Informed patient and patient verbalized understanding of results  

## 2022-06-26 DIAGNOSIS — M3214 Glomerular disease in systemic lupus erythematosus: Secondary | ICD-10-CM | POA: Diagnosis not present

## 2022-06-26 DIAGNOSIS — E875 Hyperkalemia: Secondary | ICD-10-CM | POA: Diagnosis not present

## 2022-06-26 DIAGNOSIS — I10 Essential (primary) hypertension: Secondary | ICD-10-CM | POA: Diagnosis not present

## 2022-06-28 ENCOUNTER — Ambulatory Visit: Payer: Medicare Other | Admitting: Internal Medicine

## 2022-06-28 ENCOUNTER — Inpatient Hospital Stay: Payer: Medicare Other | Attending: Internal Medicine

## 2022-06-28 DIAGNOSIS — D519 Vitamin B12 deficiency anemia, unspecified: Secondary | ICD-10-CM

## 2022-06-28 DIAGNOSIS — E538 Deficiency of other specified B group vitamins: Secondary | ICD-10-CM | POA: Diagnosis not present

## 2022-06-28 DIAGNOSIS — N183 Chronic kidney disease, stage 3 unspecified: Secondary | ICD-10-CM | POA: Insufficient documentation

## 2022-06-28 DIAGNOSIS — D631 Anemia in chronic kidney disease: Secondary | ICD-10-CM | POA: Insufficient documentation

## 2022-06-28 MED ORDER — CYANOCOBALAMIN 1000 MCG/ML IJ SOLN
1000.0000 ug | Freq: Once | INTRAMUSCULAR | Status: AC
  Administered 2022-06-28: 1000 ug via INTRAMUSCULAR
  Filled 2022-06-28: qty 1

## 2022-07-02 DIAGNOSIS — D2262 Melanocytic nevi of left upper limb, including shoulder: Secondary | ICD-10-CM | POA: Diagnosis not present

## 2022-07-02 DIAGNOSIS — D2261 Melanocytic nevi of right upper limb, including shoulder: Secondary | ICD-10-CM | POA: Diagnosis not present

## 2022-07-02 DIAGNOSIS — D2272 Melanocytic nevi of left lower limb, including hip: Secondary | ICD-10-CM | POA: Diagnosis not present

## 2022-07-02 DIAGNOSIS — D225 Melanocytic nevi of trunk: Secondary | ICD-10-CM | POA: Diagnosis not present

## 2022-07-02 DIAGNOSIS — L821 Other seborrheic keratosis: Secondary | ICD-10-CM | POA: Diagnosis not present

## 2022-07-03 DIAGNOSIS — M542 Cervicalgia: Secondary | ICD-10-CM | POA: Diagnosis not present

## 2022-07-03 DIAGNOSIS — M9901 Segmental and somatic dysfunction of cervical region: Secondary | ICD-10-CM | POA: Diagnosis not present

## 2022-07-03 DIAGNOSIS — M9902 Segmental and somatic dysfunction of thoracic region: Secondary | ICD-10-CM | POA: Diagnosis not present

## 2022-07-03 DIAGNOSIS — M40292 Other kyphosis, cervical region: Secondary | ICD-10-CM | POA: Diagnosis not present

## 2022-07-10 DIAGNOSIS — M329 Systemic lupus erythematosus, unspecified: Secondary | ICD-10-CM | POA: Diagnosis not present

## 2022-07-10 DIAGNOSIS — M25472 Effusion, left ankle: Secondary | ICD-10-CM | POA: Diagnosis not present

## 2022-07-10 DIAGNOSIS — D2372 Other benign neoplasm of skin of left lower limb, including hip: Secondary | ICD-10-CM | POA: Diagnosis not present

## 2022-07-10 DIAGNOSIS — G5792 Unspecified mononeuropathy of left lower limb: Secondary | ICD-10-CM | POA: Diagnosis not present

## 2022-07-10 DIAGNOSIS — M79672 Pain in left foot: Secondary | ICD-10-CM | POA: Diagnosis not present

## 2022-07-11 ENCOUNTER — Ambulatory Visit
Admission: RE | Admit: 2022-07-11 | Discharge: 2022-07-11 | Disposition: A | Discharge status: Home / Self Care | Payer: Medicare Other | Source: Ambulatory Visit | Attending: Obstetrics and Gynecology | Admitting: Obstetrics and Gynecology

## 2022-07-11 DIAGNOSIS — Z1231 Encounter for screening mammogram for malignant neoplasm of breast: Secondary | ICD-10-CM | POA: Diagnosis not present

## 2022-07-13 ENCOUNTER — Other Ambulatory Visit: Payer: Self-pay | Admitting: Obstetrics and Gynecology

## 2022-07-13 DIAGNOSIS — Z1231 Encounter for screening mammogram for malignant neoplasm of breast: Secondary | ICD-10-CM

## 2022-07-16 ENCOUNTER — Other Ambulatory Visit: Payer: Self-pay | Admitting: Obstetrics and Gynecology

## 2022-07-16 DIAGNOSIS — Z1231 Encounter for screening mammogram for malignant neoplasm of breast: Secondary | ICD-10-CM

## 2022-07-31 ENCOUNTER — Encounter: Payer: Self-pay | Admitting: Physician Assistant

## 2022-07-31 ENCOUNTER — Ambulatory Visit (INDEPENDENT_AMBULATORY_CARE_PROVIDER_SITE_OTHER): Payer: Medicare Other | Admitting: Physician Assistant

## 2022-07-31 VITALS — BP 104/70 | HR 83 | Temp 98.2°F | Ht 65.0 in | Wt 184.2 lb

## 2022-07-31 DIAGNOSIS — R350 Frequency of micturition: Secondary | ICD-10-CM | POA: Diagnosis not present

## 2022-07-31 DIAGNOSIS — N3000 Acute cystitis without hematuria: Secondary | ICD-10-CM

## 2022-07-31 DIAGNOSIS — M40292 Other kyphosis, cervical region: Secondary | ICD-10-CM | POA: Diagnosis not present

## 2022-07-31 DIAGNOSIS — M9901 Segmental and somatic dysfunction of cervical region: Secondary | ICD-10-CM | POA: Diagnosis not present

## 2022-07-31 DIAGNOSIS — M542 Cervicalgia: Secondary | ICD-10-CM | POA: Diagnosis not present

## 2022-07-31 DIAGNOSIS — M9902 Segmental and somatic dysfunction of thoracic region: Secondary | ICD-10-CM | POA: Diagnosis not present

## 2022-07-31 LAB — URINALYSIS, ROUTINE W REFLEX MICROSCOPIC
Bilirubin, UA: NEGATIVE
Glucose, UA: NEGATIVE
Ketones, UA: NEGATIVE
Nitrite, UA: NEGATIVE
Specific Gravity, UA: 1.025 (ref 1.005–1.030)
Urobilinogen, Ur: 0.2 mg/dL (ref 0.2–1.0)
pH, UA: 5.5 (ref 5.0–7.5)

## 2022-07-31 LAB — MICROSCOPIC EXAMINATION: Bacteria, UA: NONE SEEN

## 2022-07-31 MED ORDER — NITROFURANTOIN MONOHYD MACRO 100 MG PO CAPS: 100.0000 mg | ORAL_CAPSULE | Freq: Two times a day (BID) | ORAL | 0 refills | Status: AC

## 2022-07-31 NOTE — Patient Instructions (Addendum)
I recommend starting a daily antihistamine such as Claritin, Zyrtec or Allegra You can try Flonase to help with your ear fullness  I would not recommend using benadryl as this can make you feel dry and drowsy   I am sending in a script for an antibiotic to help with your UTI- we will also send a sample for urine culture to confirm bacterial growth and aid in antibiotic selection should we need to change medications.   Please drink plenty of water and avoid holding your urine for prolonged periods of time

## 2022-07-31 NOTE — Progress Notes (Signed)
Acute Office Visit   Patient: [name]   DOB: May 28, 1956   66 y.o. Female  MRN: [id number] Visit Date: 07/31/2022  Today's healthcare provider: Dani Gobble Jody Silas, PA-C  Introduced myself to the patient as a Journalist, newspaper and provided education on APPs in clinical practice.    Chief Complaint  Patient presents with   Cough   Head congestion    Frequent urnation    Started about a week ago Took OTC ,AZO    Subjective    Cough Associated symptoms include headaches. Pertinent negatives include no fever.   HPI     Frequent urnation    Additional comments: Started about a week ago Took OTC ,AZO       Last edited by Jerelene Redden, CMA on 07/31/2022  2:48 PM.      Concern for UTI She reports she has had urinary urgency and some hesitancy  Reports this has been going on since Friday Denies flank pain, fevers,  She has been using AZO tablets and Methenamine +sodium salicylate tablets    Cough and headache Reports she gets this every year Tried to take leftover Codeine cough syrup but this did not improve symptoms States now she has dry cough and ear fullness She has been taking a generic OTC antihistamine but has stopped taking it while using the AZO       Medications: Outpatient Medications Prior to Visit  Medication Sig   aspirin 81 MG tablet Take 81 mg by mouth daily.   chlorthalidone (HYGROTON) 25 MG tablet Take 12.5 mg by mouth in the morning.   Cholecalciferol (VITAMIN D3 PO) Take 1 tablet by mouth daily.    cyanocobalamin 1000 MCG tablet Take 1,000 mcg by mouth daily. Vitamin B12   enalapril (VASOTEC) 20 MG tablet Take 20 mg by mouth 2 (two) times daily.   Ferrous Sulfate (IRON) 325 (65 Fe) MG TABS Take by mouth.   hydroxychloroquine (PLAQUENIL) 200 MG tablet Take 200 mg by mouth every other day.    lipase/protease/amylase (CREON) 36000 UNITS CPEP capsule Take 2 capsules with the first bite of each meal and 1 capsule with the first bite of each snack    meclizine (ANTIVERT) 12.5 MG tablet Take 1 tablet (12.5 mg total) by mouth 3 (three) times daily as needed for dizziness.   traMADol (ULTRAM) 50 MG tablet Take 50 mg by mouth every 6 (six) hours as needed for moderate pain.   traZODone (DESYREL) 50 MG tablet TAKE 1 TABLET BY MOUTH EVERY DAY AT BEDTIME AS NEEDED FOR SLEEP   No facility-administered medications prior to visit.    Review of Systems  Constitutional:  Negative for fatigue and fever.  Respiratory:  Positive for cough.   Genitourinary:  Positive for frequency and urgency. Negative for decreased urine volume, difficulty urinating, dysuria, enuresis, flank pain and hematuria.  Neurological:  Positive for headaches.       Objective    BP 104/70   Pulse 83   Temp 98.2 F (36.8 C) (Oral)   Ht '5\' 5"'$  (1.651 m)   Wt 184 lb 3.2 oz (83.6 kg)   LMP 08/22/2008   SpO2 98%   BMI 30.65 kg/m    Physical Exam Vitals reviewed.  Constitutional:      General: She is awake.     Appearance: Normal appearance. She is well-developed and well-groomed.  HENT:     Right Ear: Hearing, tympanic membrane, ear canal and  external ear normal.     Left Ear: Hearing, tympanic membrane, ear canal and external ear normal.     Mouth/Throat:     Lips: Pink.  Pulmonary:     Effort: Pulmonary effort is normal.  Abdominal:     General: Abdomen is flat.     Palpations: Abdomen is soft.     Tenderness: There is no abdominal tenderness.  Musculoskeletal:     Cervical back: Normal range of motion.  Neurological:     Mental Status: She is alert.  Psychiatric:        Attention and Perception: Attention and perception normal.        Mood and Affect: Mood and affect normal.        Speech: Speech normal.        Behavior: Behavior normal. Behavior is cooperative.       No results found for any visits on 07/31/22.  Assessment & Plan      No follow-ups on file.      Problem List Items Addressed This Visit   None Visit Diagnoses      Frequency of urination    -  Primary   Relevant Orders   Urine Culture   Urinalysis, Routine w reflex microscopic (Completed)   Microscopic Examination (Completed)   Acute cystitis without hematuria     Acute, new concern Reports increased frequency urination along with mild urinary hesitancy  UA was concerning for UTI - will send for culture to confirm and to guide abx therapy Will send in script for Macrobid 100  mg PO BID x 5 days as she is allergic to sulfa abx Culture results to guide further management Recommend staying well hydrated and avoiding holding urine for prolonged periods of time  Follow up as needed for persistent or progressing symptoms.     Relevant Medications   nitrofurantoin, macrocrystal-monohydrate, (MACROBID) 100 MG capsule        No follow-ups on file.   I, Terriana Barreras E Brityn Mastrogiovanni, PA-C, have reviewed all documentation for this visit. The documentation on 07/31/22 for the exam, diagnosis, procedures, and orders are all accurate and complete.   Talitha Givens, MHS, PA-C [facility]

## 2022-08-01 ENCOUNTER — Ambulatory Visit: Payer: Self-pay | Admitting: *Deleted

## 2022-08-01 NOTE — Telephone Encounter (Signed)
Summary: rx req / ear discomfort   The patient shares that they were seen yesterday for ear discomfort 07/31/22   The patient was encouraged and discussed a prescription for Flonase   The patient believed that they had some left and would not need a prescription   The patient had less Flonase than they originally believed   The patient would like a prescription for the medication to help with their ear discomfort and congestion   Please contact further when possible     Reason for Disposition . Prescription request for new medicine (not a refill)  Answer Assessment - Initial Assessment Questions 1. DRUG NAME: "What medicine do you need to have refilled?"     Flonase 2. REFILLS REMAINING: "How many refills are remaining?" (Note: The label on the medicine or pill bottle will show how many refills are remaining. If there are no refills remaining, then a renewal may be needed.)     none 3. EXPIRATION DATE: "What is the expiration date?" (Note: The label states when the prescription will expire, and thus can no longer be refilled.)       4. PRESCRIBING HCP: "Who prescribed it?" Reason: If prescribed by specialist, call should be referred to that group.     PCP 5. SYMPTOMS: "Do you have any symptoms?"     Sinus symptoms- was seen in office yesterday- states she thought she had Rx- but failed to request Rx and she needs it.  [facility] [address]  Protocols used: Medication Refill and Renewal Call-A-AH

## 2022-08-02 ENCOUNTER — Other Ambulatory Visit: Payer: Self-pay | Admitting: Physician Assistant

## 2022-08-02 LAB — URINE CULTURE

## 2022-08-02 MED ORDER — FLUTICASONE PROPIONATE 50 MCG/ACT NA SUSP: 2.0000 | Freq: Every day | NASAL | 6 refills | Status: AC

## 2022-08-02 NOTE — Telephone Encounter (Signed)
Refill sent per request.

## 2022-08-06 ENCOUNTER — Telehealth: Payer: Self-pay

## 2022-08-06 NOTE — Telephone Encounter (Signed)
Filled out [employer] provider portion of patient assistance form for Creon. Faxed form to [phone]

## 2022-08-10 DIAGNOSIS — S71112A Laceration without foreign body, left thigh, initial encounter: Secondary | ICD-10-CM | POA: Diagnosis not present

## 2022-08-11 ENCOUNTER — Other Ambulatory Visit: Payer: Self-pay

## 2022-08-11 ENCOUNTER — Observation Stay
Admission: EM | Admit: 2022-08-11 | Discharge: 2022-08-12 | Disposition: A | Discharge status: Home / Self Care | Payer: Medicare Other | Attending: Osteopathic Medicine | Admitting: Osteopathic Medicine

## 2022-08-11 ENCOUNTER — Emergency Department: Payer: Medicare Other

## 2022-08-11 DIAGNOSIS — M329 Systemic lupus erythematosus, unspecified: Secondary | ICD-10-CM | POA: Diagnosis not present

## 2022-08-11 DIAGNOSIS — T148XXA Other injury of unspecified body region, initial encounter: Secondary | ICD-10-CM | POA: Diagnosis present

## 2022-08-11 DIAGNOSIS — S81851A Open bite, right lower leg, initial encounter: Secondary | ICD-10-CM | POA: Diagnosis not present

## 2022-08-11 DIAGNOSIS — R778 Other specified abnormalities of plasma proteins: Secondary | ICD-10-CM | POA: Insufficient documentation

## 2022-08-11 DIAGNOSIS — R7989 Other specified abnormal findings of blood chemistry: Secondary | ICD-10-CM | POA: Insufficient documentation

## 2022-08-11 DIAGNOSIS — D649 Anemia, unspecified: Secondary | ICD-10-CM | POA: Diagnosis not present

## 2022-08-11 DIAGNOSIS — I129 Hypertensive chronic kidney disease with stage 1 through stage 4 chronic kidney disease, or unspecified chronic kidney disease: Secondary | ICD-10-CM | POA: Diagnosis not present

## 2022-08-11 DIAGNOSIS — S71112A Laceration without foreign body, left thigh, initial encounter: Principal | ICD-10-CM | POA: Insufficient documentation

## 2022-08-11 DIAGNOSIS — S81811A Laceration without foreign body, right lower leg, initial encounter: Secondary | ICD-10-CM | POA: Diagnosis not present

## 2022-08-11 DIAGNOSIS — M3214 Glomerular disease in systemic lupus erythematosus: Secondary | ICD-10-CM | POA: Diagnosis present

## 2022-08-11 DIAGNOSIS — W540XXA Bitten by dog, initial encounter: Secondary | ICD-10-CM | POA: Insufficient documentation

## 2022-08-11 DIAGNOSIS — Z79899 Other long term (current) drug therapy: Secondary | ICD-10-CM | POA: Diagnosis not present

## 2022-08-11 DIAGNOSIS — N183 Chronic kidney disease, stage 3 unspecified: Secondary | ICD-10-CM | POA: Diagnosis present

## 2022-08-11 DIAGNOSIS — Z7982 Long term (current) use of aspirin: Secondary | ICD-10-CM | POA: Diagnosis not present

## 2022-08-11 DIAGNOSIS — R55 Syncope and collapse: Secondary | ICD-10-CM | POA: Diagnosis not present

## 2022-08-11 DIAGNOSIS — S81051A Open bite, right knee, initial encounter: Secondary | ICD-10-CM | POA: Diagnosis present

## 2022-08-11 DIAGNOSIS — D62 Acute posthemorrhagic anemia: Secondary | ICD-10-CM | POA: Insufficient documentation

## 2022-08-11 DIAGNOSIS — S81859A Open bite, unspecified lower leg, initial encounter: Secondary | ICD-10-CM | POA: Diagnosis not present

## 2022-08-11 DIAGNOSIS — K5730 Diverticulosis of large intestine without perforation or abscess without bleeding: Secondary | ICD-10-CM | POA: Diagnosis not present

## 2022-08-11 DIAGNOSIS — S71132A Puncture wound without foreign body, left thigh, initial encounter: Secondary | ICD-10-CM | POA: Diagnosis not present

## 2022-08-11 DIAGNOSIS — Z789 Other specified health status: Secondary | ICD-10-CM | POA: Insufficient documentation

## 2022-08-11 DIAGNOSIS — I10 Essential (primary) hypertension: Secondary | ICD-10-CM | POA: Diagnosis not present

## 2022-08-11 DIAGNOSIS — S81001A Unspecified open wound, right knee, initial encounter: Secondary | ICD-10-CM | POA: Diagnosis not present

## 2022-08-11 DIAGNOSIS — T797XXA Traumatic subcutaneous emphysema, initial encounter: Secondary | ICD-10-CM | POA: Diagnosis not present

## 2022-08-11 DIAGNOSIS — M79605 Pain in left leg: Secondary | ICD-10-CM | POA: Diagnosis not present

## 2022-08-11 LAB — CBC
HCT: 28.2 % — ABNORMAL LOW (ref 36.0–46.0)
HCT: 25.2 % — ABNORMAL LOW (ref 36.0–46.0)
Hemoglobin: 9.4 g/dL — ABNORMAL LOW (ref 12.0–15.0)
Hemoglobin: 8.4 g/dL — ABNORMAL LOW (ref 12.0–15.0)
MCH: 32.5 pg (ref 26.0–34.0)
MCH: 32.9 pg (ref 26.0–34.0)
MCHC: 33.3 g/dL (ref 30.0–36.0)
MCHC: 33.3 g/dL (ref 30.0–36.0)
MCV: 97.6 fL (ref 80.0–100.0)
MCV: 98.8 fL (ref 80.0–100.0)
Platelets: 449 10*3/uL — ABNORMAL HIGH (ref 150–400)
Platelets: 408 10*3/uL — ABNORMAL HIGH (ref 150–400)
RBC: 2.89 MIL/uL — ABNORMAL LOW (ref 3.87–5.11)
RBC: 2.55 MIL/uL — ABNORMAL LOW (ref 3.87–5.11)
RDW: 12.8 % (ref 11.5–15.5)
RDW: 12.8 % (ref 11.5–15.5)
WBC: 12.4 10*3/uL — ABNORMAL HIGH (ref 4.0–10.5)
WBC: 17.5 10*3/uL — ABNORMAL HIGH (ref 4.0–10.5)
nRBC: 0 % (ref 0.0–0.2)
nRBC: 0 % (ref 0.0–0.2)

## 2022-08-11 LAB — BASIC METABOLIC PANEL
Anion gap: 9 (ref 5–15)
BUN: 30 mg/dL — ABNORMAL HIGH (ref 8–23)
CO2: 19 mmol/L — ABNORMAL LOW (ref 22–32)
Calcium: 8.7 mg/dL — ABNORMAL LOW (ref 8.9–10.3)
Chloride: 106 mmol/L (ref 98–111)
Creatinine, Ser: 1.47 mg/dL — ABNORMAL HIGH (ref 0.44–1.00)
GFR, Estimated: 39 mL/min — ABNORMAL LOW (ref >60)
Glucose, Bld: 118 mg/dL — ABNORMAL HIGH (ref 70–99)
Potassium: 4.2 mmol/L (ref 3.5–5.1)
Sodium: 134 mmol/L — ABNORMAL LOW (ref 135–145)

## 2022-08-11 LAB — PROTIME-INR
INR: 1.1 (ref 0.8–1.2)
Prothrombin Time: 13.9 seconds (ref 11.4–15.2)

## 2022-08-11 LAB — TROPONIN I (HIGH SENSITIVITY)
Troponin I (High Sensitivity): 20 ng/L — ABNORMAL HIGH (ref <18)
Troponin I (High Sensitivity): 37 ng/L — ABNORMAL HIGH (ref <18)

## 2022-08-11 LAB — TYPE AND SCREEN
ABO/RH(D): O POS
Antibody Screen: NEGATIVE

## 2022-08-11 LAB — CBG MONITORING, ED: Glucose-Capillary: 112 mg/dL — ABNORMAL HIGH (ref 70–99)

## 2022-08-11 MED ORDER — SODIUM CHLORIDE 0.9 % IV SOLN
3.0000 g | Freq: Once | INTRAVENOUS | Status: AC
  Administered 2022-08-11: 3 g via INTRAVENOUS
  Filled 2022-08-11: qty 8

## 2022-08-11 MED ORDER — LIDOCAINE HCL (PF) 1 % IJ SOLN
INTRAMUSCULAR | Status: AC
  Filled 2022-08-11: qty 30

## 2022-08-11 MED ORDER — CHLORTHALIDONE 25 MG PO TABS
12.5000 mg | ORAL_TABLET | Freq: Every day | ORAL | Status: DC
  Administered 2022-08-12: 12.5 mg via ORAL
  Filled 2022-08-11: qty 0.5

## 2022-08-11 MED ORDER — FENTANYL CITRATE PF 50 MCG/ML IJ SOSY: 50.0000 ug | PREFILLED_SYRINGE | Freq: Once | INTRAMUSCULAR | Status: DC

## 2022-08-11 MED ORDER — MECLIZINE HCL 25 MG PO TABS: 12.5000 mg | ORAL_TABLET | Freq: Three times a day (TID) | ORAL | Status: DC | PRN

## 2022-08-11 MED ORDER — VITAMIN B-12 1000 MCG PO TABS
1000.0000 ug | ORAL_TABLET | Freq: Every day | ORAL | Status: DC
  Administered 2022-08-12: 1000 ug via ORAL
  Filled 2022-08-11: qty 1

## 2022-08-11 MED ORDER — LACTATED RINGERS IV BOLUS
1000.0000 mL | Freq: Once | INTRAVENOUS | Status: AC
  Administered 2022-08-11: 1000 mL via INTRAVENOUS

## 2022-08-11 MED ORDER — ONDANSETRON HCL 4 MG/2ML IJ SOLN
4.0000 mg | Freq: Once | INTRAMUSCULAR | Status: AC
  Administered 2022-08-11: 4 mg via INTRAVENOUS
  Filled 2022-08-11: qty 2

## 2022-08-11 MED ORDER — IOHEXOL 350 MG/ML SOLN
100.0000 mL | Freq: Once | INTRAVENOUS | Status: AC | PRN
  Administered 2022-08-11: 100 mL via INTRAVENOUS

## 2022-08-11 MED ORDER — ONDANSETRON HCL 4 MG/2ML IJ SOLN
INTRAMUSCULAR | Status: AC
  Filled 2022-08-11: qty 2

## 2022-08-11 MED ORDER — PANCRELIPASE (LIP-PROT-AMYL) 12000-38000 UNITS PO CPEP: 36000.0000 [IU] | ORAL_CAPSULE | ORAL | Status: DC | PRN

## 2022-08-11 MED ORDER — POLYETHYLENE GLYCOL 3350 17 G PO PACK: 17.0000 g | PACK | Freq: Every day | ORAL | Status: DC | PRN

## 2022-08-11 MED ORDER — ONDANSETRON HCL 4 MG/2ML IJ SOLN: 4.0000 mg | Freq: Four times a day (QID) | INTRAMUSCULAR | Status: DC | PRN

## 2022-08-11 MED ORDER — OXYCODONE HCL 5 MG PO TABS
5.0000 mg | ORAL_TABLET | ORAL | Status: DC | PRN
  Administered 2022-08-12: 5 mg via ORAL
  Filled 2022-08-11: qty 1

## 2022-08-11 MED ORDER — PANCRELIPASE (LIP-PROT-AMYL) 12000-38000 UNITS PO CPEP
72000.0000 [IU] | ORAL_CAPSULE | Freq: Three times a day (TID) | ORAL | Status: DC
  Administered 2022-08-12 (×2): 72000 [IU] via ORAL
  Filled 2022-08-11 (×2): qty 6
  Filled 2022-08-11: qty 2

## 2022-08-11 MED ORDER — AMOXICILLIN-POT CLAVULANATE 875-125 MG PO TABS
1.0000 | ORAL_TABLET | Freq: Two times a day (BID) | ORAL | Status: DC
  Administered 2022-08-12: 1 via ORAL
  Filled 2022-08-11: qty 1

## 2022-08-11 MED ORDER — ACETAMINOPHEN 650 MG RE SUPP: 650.0000 mg | Freq: Four times a day (QID) | RECTAL | Status: DC | PRN

## 2022-08-11 MED ORDER — TRAZODONE HCL 50 MG PO TABS: 50.0000 mg | ORAL_TABLET | Freq: Every evening | ORAL | Status: DC | PRN

## 2022-08-11 MED ORDER — LIDOCAINE HCL (PF) 1 % IJ SOLN
30.0000 mL | Freq: Once | INTRAMUSCULAR | Status: AC
  Administered 2022-08-11: 30 mL via INTRADERMAL
  Filled 2022-08-11: qty 30

## 2022-08-11 MED ORDER — SODIUM CHLORIDE 0.9% FLUSH
3.0000 mL | Freq: Two times a day (BID) | INTRAVENOUS | Status: DC
  Administered 2022-08-12: 3 mL via INTRAVENOUS

## 2022-08-11 MED ORDER — LORAZEPAM 2 MG/ML IJ SOLN
0.5000 mg | Freq: Once | INTRAMUSCULAR | Status: AC
  Administered 2022-08-11: 0.5 mg via INTRAVENOUS
  Filled 2022-08-11: qty 1

## 2022-08-11 MED ORDER — ACETAMINOPHEN 325 MG PO TABS: 650.0000 mg | ORAL_TABLET | Freq: Four times a day (QID) | ORAL | Status: DC | PRN

## 2022-08-11 MED ORDER — ENALAPRIL MALEATE 10 MG PO TABS
20.0000 mg | ORAL_TABLET | Freq: Two times a day (BID) | ORAL | Status: DC
  Administered 2022-08-11: 10 mg via ORAL
  Filled 2022-08-11: qty 2

## 2022-08-11 MED ORDER — ONDANSETRON HCL 4 MG/2ML IJ SOLN
4.0000 mg | Freq: Once | INTRAMUSCULAR | Status: AC
  Administered 2022-08-11: 4 mg via INTRAVENOUS

## 2022-08-11 MED ORDER — ONDANSETRON HCL 4 MG PO TABS: 4.0000 mg | ORAL_TABLET | Freq: Four times a day (QID) | ORAL | Status: DC | PRN

## 2022-08-11 MED ORDER — HYDROXYCHLOROQUINE SULFATE 200 MG PO TABS
200.0000 mg | ORAL_TABLET | ORAL | Status: DC
  Filled 2022-08-11: qty 1

## 2022-08-11 NOTE — H&P (Signed)
[facility] History and Physical  [name] XBJ:[id number] DOB: [birth year] DOA: 08/11/2022  Referring physician: Dr. Archie Balboa PCP: Jon Billings, NP   Chief Complaint: dog bite  HPI: [name] is a 66 y.o. female with history of Lupus with renal involvement, CKD stage III, who presents after being attacked by a dog.  Of note patient was seen yesterday at internal medicine clinic, at that time she reported that she had fallen the day prior onto a metal cart and sustained a laceration to her left upper thigh.  A 1.5 cm laceration was sutured, her last tetanus was noted to be given in 2019, and she was sent home with a prescription for doxycycline.  Today patient initially presented to the ED after being attacked by a family member's [place].  She sustained several lacerations to bilateral legs.  While she was waiting to be evaluated by the physician and was in the waiting room she had an episode of feeling very hot, dizzy, flushed, and ED physician Dr. Archie Balboa was called to evaluate urgently.  She was noted to be mildly hypotensive with blood pressures in the 80s over 50s, and noted to have a fairly significant hematoma over the left lower extremity.  Given patient's symptoms there was some concern for possible arterial involvement of the dog bite wound, so basic labs were obtained and a CTA of the lower extremities was ordered.  BMP was largely unremarkable with patient's creatinine at her baseline.  CBC showed elevated white count of 12.4 and hemoglobin of 9.4, down from her baseline of around 11 a few months prior.  Repeat of her hemoglobin 4 hours later showed a drop to 8.4, and a rise in her white blood cells to 17.  CT demonstrated puncture wounds however there is no evidence of arterial injury.  Troponins were also obtained, initial was 20 and repeat 4 hours later had risen slightly to 37.  EKG showed normal sinus rhythm with no acute ischemic changes and was  unchanged from prior.  On my evaluation patient is calm and well-appearing.  She corroborates the above history.  Denies currently any numbness tingling or weakness in her lower extremities.  Endorses some tenderness where she was bitten but it is not worsening.  She denies any chest pain, shortness of breath at present.  She denies any of those symptoms with exertion.  She reports her earlier episode of dizziness, sweating, nausea, came on suddenly while she was seated and went away quickly.  Review of Systems:  Pertinent positives and negative per HPI, all others reviewed and negative  Past Medical History:  Diagnosis Date   Abnormal pap    Anemia    Anxiety    History of lupus 1987   Hypertension    Low vitamin D level    Renal insufficiency 1988   kidney biopsy 1988 and 1998- "my kidneys function at like 48%"   Past Surgical History:  Procedure Laterality Date   BONE MARROW BIOPSY  01/2021   BREAST BIOPSY Right 07/08/2017   BREAST EXCISIONAL BIOPSY Right 08/06/2017   BREAST EXCISIONAL BIOPSY Right 07/1999   BREAST LUMPECTOMY WITH RADIOACTIVE SEED LOCALIZATION Right 08/06/2017   Procedure: RIGHT BREAST LUMPECTOMY WITH RADIOACTIVE SEED LOCALIZATION ERAS PATHWAY;  Surgeon: Excell Seltzer, MD;  Location: [place];  Service: General;  Laterality: Right;   BREAST LUMPECTOMY WITH RADIOACTIVE SEED LOCALIZATION Right 08/19/2018   Procedure: RIGHT BREAST LUMPECTOMY [place];  Surgeon: Excell Seltzer,  MD;  Location: [place];  Service: General;  Laterality: Right;   BREAST SURGERY  07/1999   right breast benign fibroadenoma   COLONOSCOPY  2012   COLONOSCOPY WITH PROPOFOL N/A 05/01/2017   Procedure: COLONOSCOPY WITH PROPOFOL;  Surgeon: Christene Lye, MD;  Location: [facility] ENDOSCOPY;  Service: Endoscopy;  Laterality: N/A;   COLONOSCOPY WITH PROPOFOL N/A 12/18/2021   Procedure: COLONOSCOPY WITH PROPOFOL;  Surgeon: Lin Landsman, MD;  Location[facility] ENDOSCOPY;  Service: Gastroenterology;  Laterality: N/A;   CRYOTHERAPY     abnormal pap smear   ESOPHAGOGASTRODUODENOSCOPY (EGD) WITH PROPOFOL N/A 05/01/2017   Procedure: ESOPHAGOGASTRODUODENOSCOPY (EGD) WITH PROPOFOL;  Surgeon: Christene Lye, MD;  Location: [facility] ENDOSCOPY;  Service: Endoscopy;  Laterality: N/A;   ESOPHAGOGASTRODUODENOSCOPY (EGD) WITH PROPOFOL N/A 12/18/2021   Procedure: ESOPHAGOGASTRODUODENOSCOPY (EGD) WITH PROPOFOL;  Surgeon: Lin Landsman, MD;  Location: [facility] ENDOSCOPY;  Service: Gastroenterology;  Laterality: N/A;   NASAL SEPTUM SURGERY  2007   TUBAL LIGATION  1990   Social History:  reports that she has never smoked. She has never used smokeless tobacco. She reports current alcohol use of about 4.0 - 6.0 standard drinks of alcohol per week. She reports current drug use. Drug: Marijuana.  Allergies  Allergen Reactions   Elemental Sulfur Hives   Sulfa Antibiotics Hives, Itching and Rash   Beef-Derived Products     Stomach issues   L[employer] meat-Stomach issues    Milk-Related Compounds     Stomach issues    Family History  Problem Relation Age of Onset   Cancer Father        oral cancer   Cancer Maternal Grandmother        ovarian cancer   Hypertension Maternal Grandmother    Diabetes Maternal Grandfather    Diabetes Paternal Aunt    Breast cancer Neg Hx      Prior to Admission medications   Medication Sig Start Date End Date Taking? Authorizing Provider  aspirin 81 MG tablet Take 81 mg by mouth daily.    [provider]  chlorthalidone (HYGROTON) 25 MG tablet Take 12.5 mg by mouth in the morning.    [provider]  Cholecalciferol (VITAMIN D3 PO) Take 1 tablet by mouth daily.     [provider]  cyanocobalamin 1000 MCG tablet Take 1,000 mcg by mouth daily. Vitamin B12    [provider]  enalapril (VASOTEC) 20 MG tablet Take 20 mg by mouth 2 (two) times  daily. 04/18/21   [provider]  Ferrous Sulfate (IRON) 325 (65 Fe) MG TABS Take by mouth.    [provider]  fluticasone (FLONASE) 50 MCG/ACT nasal spray Place 2 sprays into both nostrils daily. 08/02/22   Mecum, Erin E, PA-C  hydroxychloroquine (PLAQUENIL) 200 MG tablet Take 200 mg by mouth every other day.     [provider]  lipase/protease/amylase (CREON) 36000 UNITS CPEP capsule Take 2 capsules with the first bite of each meal and 1 capsule with the first bite of each snack 01/01/22   Lin Landsman, MD  meclizine (ANTIVERT) 12.5 MG tablet Take 1 tablet (12.5 mg total) by mouth 3 (three) times daily as needed for dizziness. 03/01/22   Jon Billings, NP  traMADol (ULTRAM) 50 MG tablet Take 50 mg by mouth every 6 (six) hours as needed for moderate pain.    [provider]  traZODone (DESYREL) 50 MG tablet TAKE 1 TABLET BY MOUTH EVERY  DAY AT BEDTIME AS NEEDED FOR SLEEP 02/28/22   Jon Billings, NP   Physical Exam: Vitals:   08/11/22 1930 08/11/22 2000 08/11/22 2100 08/11/22 2115  BP: (!) 109/45 (!) 109/52 (!) 116/51 (!) 108/47  Pulse: 82 81 72 75  Resp: '18 16 16 20  ' Temp:    97.8 F (36.6 C)  TempSrc:    Oral  SpO2: 100% 100% 100% 100%  Weight:        Wt Readings from Last 3 Encounters:  08/11/22 83.9 kg  07/31/22 83.6 kg  05/03/22 83.5 kg    Physical Exam Constitutional:      General: She is not in acute distress.    Appearance: Normal appearance. She is not ill-appearing, toxic-appearing or diaphoretic.  HENT:     Head: Normocephalic and atraumatic.  Eyes:     General: No scleral icterus. Cardiovascular:     Rate and Rhythm: Normal rate and regular rhythm.     Heart sounds: Normal heart sounds. No murmur heard. Pulmonary:     Effort: Pulmonary effort is normal. No respiratory distress.     Breath sounds: Normal breath sounds. No wheezing or rales.  Abdominal:     General: Abdomen is flat. Bowel sounds are normal. There  is no distension.     Palpations: Abdomen is soft. There is no mass.     Tenderness: There is no abdominal tenderness.  Musculoskeletal:     Comments: Anterior left thigh is mildly tender to palpation, thigh does not feel tense or firm  Skin:    General: Skin is warm and dry.     Comments: Two bandages over L thigh, one over R knee Large echymoses over R thigh  Neurological:     Mental Status: She is alert.  Psychiatric:        Mood and Affect: Mood normal.        Behavior: Behavior normal.            Labs on Admission:  Basic Metabolic Panel: Recent Labs  Lab 08/11/22 1646  NA 134*  K 4.2  CL 106  CO2 19*  GLUCOSE 118*  BUN 30*  CREATININE 1.47*  CALCIUM 8.7*   Liver Function Tests: No results for input(s): "AST", "ALT", "ALKPHOS", "BILITOT", "PROT", "ALBUMIN" in the last 168 hours. No results for input(s): "LIPASE", "AMYLASE" in the last 168 hours. No results for input(s): "AMMONIA" in the last 168 hours. CBC: Recent Labs  Lab 08/11/22 1646 08/11/22 2054  WBC 12.4* 17.5*  HGB 9.4* 8.4*  HCT 28.2* 25.2*  MCV 97.6 98.8  PLT 449* 408*   Cardiac Enzymes: No results for input(s): "CKTOTAL", "CKMB", "CKMBINDEX", "TROPONINI" in the last 168 hours.  BNP (last 3 results) No results for input(s): "BNP" in the last 8760 hours.  ProBNP (last 3 results) No results for input(s): "PROBNP" in the last 8760 hours.  CBG: Recent Labs  Lab 08/11/22 1648  GLUCAP 112*    Radiological Exams on Admission: CT ANGIO AO+BIFEM W & OR WO CONTRAST  Result Date: 08/11/2022 CLINICAL DATA:  Dog bite.  Left lower extremity wound. EXAM: CT ANGIOGRAPHY OF ABDOMINAL AORTA WITH ILIOFEMORAL RUNOFF TECHNIQUE: Multidetector CT imaging of the abdomen, pelvis and lower extremities was performed using the standard protocol during bolus administration of intravenous contrast. Multiplanar CT image reconstructions and MIPs were obtained to evaluate the vascular anatomy. RADIATION DOSE  REDUCTION: This exam was performed according to the departmental dose-optimization program which includes automated exposure control, adjustment of the  mA and/or kV according to patient size and/or use of iterative reconstruction technique. CONTRAST:  141m OMNIPAQUE IOHEXOL 350 MG/ML SOLN COMPARISON:  None Available. FINDINGS: VASCULAR Aorta: Normal caliber aorta without aneurysm, dissection, vasculitis or significant stenosis. RIGHT Lower Extremity Inflow: Common, internal and external iliac arteries are patent without evidence of aneurysm, dissection, vasculitis or significant stenosis. Outflow: Common, superficial and profunda femoral arteries and the popliteal artery are patent without evidence of aneurysm, dissection, vasculitis or significant stenosis. Runoff: Patent three vessel runoff to the ankle. LEFT Lower Extremity Inflow: Common, internal and external iliac arteries are patent without evidence of aneurysm, dissection, vasculitis or significant stenosis. Outflow: Common, superficial and profunda femoral arteries and the popliteal artery are patent without evidence of aneurysm, dissection, vasculitis or significant stenosis. Runoff: Patent three vessel runoff to the ankle. Veins: No obvious venous abnormality within the limitations of this arterial phase study. Review of the MIP images confirms the above findings. NON-VASCULAR Adrenals/Urinary Tract: The distal ureters and urinary bladder are within normal limits. Stomach/Bowel: Diverticular changes are present in the distal descending and sigmoid colon without focal inflammation to suggest diverticulitis. The appendix is visualized and normal. Visualized small bowel is otherwise unremarkable. Lymphatic: No significant adenopathy is present. Reproductive: Uterus and bilateral adnexa are unremarkable. Other: No abdominal wall hernia or abnormality. No abdominopelvic ascites. Musculoskeletal: Multiple puncture wounds are present in the anterior left thigh.  Subcutaneous hematoma is present. No foreign body is present. There is no penetration of the deep fascia. Soft tissue wound is noted along the anteromedial aspect of the right knee. No penetration of the deep fascia. No foreign body. IMPRESSION: 1. Multiple puncture wounds in the anterior left thigh without penetration of the deep fascia. 2. Soft tissue wound along the anteromedial aspect of the right knee without penetration of the deep fascia. 3. No foreign body. 4. No acute or focal arterial injury. 5. Colonic diverticulosis without diverticulitis. Electronically Signed   By: CSan MorelleM.D.   On: 08/11/2022 18:08   DG Tibia/Fibula Right  Result Date: 08/11/2022 CLINICAL DATA:  Dog bite to right lower leg EXAM: RIGHT TIBIA AND FIBULA - 2 VIEW COMPARISON:  None Available. FINDINGS: There is no evidence of fracture or other focal bone lesions. Subcutaneous emphysema at the medial knee. Apparent soft tissue defect anterior to the proximal tibia. No radiopaque foreign body. Subcutaneous soft tissue radiodensities along the anterior distal tibia and medial malleolus likely reflects soft tissue granulomata. Plantar calcaneal spur. IMPRESSION: 1. No acute osseous abnormality.  No radiopaque foreign body. 2. Subcutaneous emphysema at the medial knee and apparent soft tissue defect anterior to the proximal tibia. Electronically Signed   By: LDarrin NipperM.D.   On: 08/11/2022 16:33   DG Femur Min 2 Views Left  Result Date: 08/11/2022 CLINICAL DATA:  Dog bite.  Left leg pain. EXAM: LEFT FEMUR 2 VIEWS COMPARISON:  None Available. FINDINGS: Minimal degenerative changes are present at the knee. No acute bone or soft tissue abnormalities are present. Mild diffuse edema is present within the soft tissues. IMPRESSION: 1. Mild diffuse edema within the soft tissues. 2. No acute osseous abnormality. Electronically Signed   By: CSan MorelleM.D.   On: 08/11/2022 16:30    EKG: Independently reviewed.  Normal  sinus rhythm, no acute ischemic changes, no changes compared to prior  Assessment/Plan Principal Problem:   Hematoma Active Problems:   Systemic lupus erythematosus (HCC)   Chronic kidney disease, stage III (moderate) (HCC)   Stage III lupus  nephritis (WHO) ([place])   Syncope, vasovagal   Elevated troponin   Known medical problems   Dog bite of lower leg   WYNNIE PACETTI is a 66 y.o. female with history of Lupus with renal involvement, CKD stage III, who presents after being attacked by a dog and is found to have a slowly declining hemoglobin.   Hematoma Somewhat rapid progression of hemoglobin drop during her stay in the ED, concern for expanding hematoma.  At present exam is reassuring.  Managing without any evidence of arterial damage. - Trend CBC every 6 hours  Syncope, vasovagal Description classic for vasovagal, likely multifactorial due to pain and rapidly worsening anemia.  Low suspicion for cardiac etiology given lack of other symptoms.  Continue to monitor.  Elevated troponin Suspect secondary to brief period of hypotension causing demand, in addition to reduced clearance related to her chronic kidney disease. - Trend troponin to peak - Telemetry  Known medical problems Lupus-continue hydroxychloroquine Hypertension-continue chlorthalidone, enalapril Insomnia-continue trazodone as needed  Dog bite of lower leg Status post suturing in the ED by Dr. Archie Balboa.  Up-to-date on tetanus shot. - Continue Augmentin twice daily    Code Status: Full Code DVT Prophylaxis: SCDs Family Communication: None Disposition Plan: Place in observation, Medical Telemetry    Time spent: 50 min  Clarnce Flock MD/MPH [facility]  Note:  This document was prepared using Systems analyst and may include unintentional dictation errors.

## 2022-08-11 NOTE — ED Provider Notes (Signed)
[facility] Provider Note    Event Date/Time   First MD Initiated Contact with Patient 08/11/22 1651     (approximate)   History   No chief complaint on file.   HPI  [name] is a 66 y.o. female  who presented to the emergency department today because of concern for dog bites. This was a known dog who is reportedly up to date on its vaccines. The patient was initially triaged a level 4 however while out in the waiting room started feeling bad and nauseous. Nursing staff reassessed and clinically felt the patient looked more pale.     Physical Exam   Triage Vital Signs: ED Triage Vitals  Enc Vitals Group     BP 08/11/22 1548 134/66     Pulse Rate 08/11/22 1548 96     Resp 08/11/22 1548 18     Temp 08/11/22 1548 98.3 F (36.8 C)     Temp Source 08/11/22 1548 Oral     SpO2 08/11/22 1643 100 %     Weight 08/11/22 1548 185 lb (83.9 kg)     Height --      Head Circumference --      Peak Flow --      Pain Score 08/11/22 1548 10     Pain Loc --      Pain Edu? --      Excl. in [place]? --     Most recent vital signs: Vitals:   08/11/22 1643 08/11/22 1645  BP:  104/61  Pulse: 67 60  Resp:  (!) 24  Temp:    SpO2: 100% 100%   General: Awake, alert, oriented. CV:  Good peripheral perfusion. Regular rate and rhythm. Resp:  Normal effort.  Abd:  No distention.  Other:  Laceration to right upper lower leg, laceration to left mid thigh and multiple puncture wounds to right thigh.   ED Results / Procedures / Treatments   Labs (all labs ordered are listed, but only abnormal results are displayed) Labs Reviewed  CBC - Abnormal; Notable for the following components:      Result Value   WBC 12.4 (*)    RBC 2.89 (*)    Hemoglobin 9.4 (*)    HCT 28.2 (*)    Platelets 449 (*)    All other components within normal limits  CBG MONITORING, ED - Abnormal; Notable for the following components:   Glucose-Capillary 112 (*)    All other components  within normal limits  BASIC METABOLIC PANEL  PROTIME-INR  TYPE AND SCREEN  TROPONIN I (HIGH SENSITIVITY)     EKG  I, Nance Pear, attending physician, personally viewed and interpreted this EKG  EKG Time: 1834 Rate: 76 Rhythm: sinus rhythm Axis: normal Intervals: qtc 440 QRS: narrow, q waves v1 ST changes: no st elevation Impression: abnormal ekg   RADIOLOGY I independently interpreted and visualized the ct angio. My interpretation: hematoma and subcutaneous gas Radiology interpretation:  IMPRESSION:  1. Multiple puncture wounds in the anterior left thigh without  penetration of the deep fascia.  2. Soft tissue wound along the anteromedial aspect of the right knee  without penetration of the deep fascia.  3. No foreign body.  4. No acute or focal arterial injury.  5. Colonic diverticulosis without diverticulitis.       I independently interpreted and visualized the Right tib/fib. My interpretation: No acute osseous abnormality. Radiology interpretation:  IMPRESSION:  1. No acute osseous abnormality.  No  radiopaque foreign body.  2. Subcutaneous emphysema at the medial knee and apparent soft  tissue defect anterior to the proximal tibia.    I independently interpreted and visualized the left femur. My interpretation: No acute osseous abnormality Radiology interpretation:  IMPRESSION:  1. Mild diffuse edema within the soft tissues.  2. No acute osseous abnormality.     PROCEDURES:  Critical Care performed: No  Procedures  LACERATION REPAIR Performed by: Nance Pear Authorized by: Nance Pear Consent: Verbal consent obtained. Risks and benefits: risks, benefits and alternatives were discussed Consent given by: patient Patient identity confirmed: provided demographic data Prepped and Draped in normal sterile fashion Wound explored  Laceration Location: right knee, left thigh  Total Laceration Length: 8 cm  No Foreign Bodies seen or  palpated  Anesthesia: local infiltration  Local anesthetic: lidocaine 1% without epinephrine  Anesthetic total: 8 ml  Irrigation method: syringe Amount of cleaning: standard  Skin closure: 4-0 ethinol  Number of sutures: 12  Technique: simple interrupted  Patient tolerance: Patient tolerated the procedure well with no immediate complications.   MEDICATIONS ORDERED IN ED: Medications  ondansetron (ZOFRAN) injection 4 mg (4 mg Intravenous Given 08/11/22 1648)  lactated ringers bolus 1,000 mL (1,000 mLs Intravenous New Bag/Given 08/11/22 1650)     IMPRESSION / MDM / ASSESSMENT AND PLAN / ED COURSE  I reviewed the triage vital signs and the nursing notes.                              Differential diagnosis includes, but is not limited to, arterial bleed, hematoma, vaso vagal.  Patient's presentation is most consistent with acute presentation with potential threat to life or bodily function.   Patient presented to the emergency department today because of concern for dog bite.  However while waiting out in the waiting room the patient became very nauseous and felt ill.  Staff felt she turned pale and brought her back to a room.  She was found to be hypotensive.  She did have evidence of bruising to her left thigh although had a recent fall there.  Given concern for possible arterial injury or expanding hematoma in that thigh CT angio was performed.  Additionally blood work was checked.  CT angio does show hematoma but no obvious arterial injury.  Blood work showed hemoglobin 9.4.  Patient was given IV fluids.  While here in the emergency department she did start to improve clinically and blood pressure did improve as well.  However on repeat blood check hemoglobin was 8.4.  I do think patient would benefit from further observation.  Additionally troponin was checked.  Initial was minimally elevated at 20.  Repeat was slightly more elevated to 236.  At this time however I have low  concern for ACS.  Think opponent could be elevated due to low perfusion briefly.  At this time would hold off on aspirin or heparin especially given concerns for bleeding and hematoma in thigh.  Discussed with Dr. Dione Plover with the hospitalist service who will plan on admission.  FINAL CLINICAL IMPRESSION(S) / ED DIAGNOSES   Final diagnoses:  Dog bite, initial encounter  Anemia, unspecified type     Note:  This document was prepared using Dragon voice recognition software and may include unintentional dictation errors.    Nance Pear, MD 08/11/22 [id number]

## 2022-08-11 NOTE — Assessment & Plan Note (Signed)
Status post suturing in the ED by Dr. Archie Balboa.  Up-to-date on tetanus shot. - Continue Augmentin twice daily

## 2022-08-11 NOTE — ED Notes (Signed)
Pt given bed pan for BM

## 2022-08-11 NOTE — ED Notes (Signed)
Wounds irrigated with NS.

## 2022-08-11 NOTE — ED Notes (Signed)
Pt continuous vomiting. MD bedside. Transferred to stretcher

## 2022-08-11 NOTE — ED Provider Triage Note (Signed)
Emergency Medicine Provider Triage Evaluation Note  [name] , a 66 y.o. female  was evaluated in triage.  Pt complains of dog bite to right lower leg left upper leg.  Patient was playing with her [place] and when they were done playing she turned to leave and the dog bit her.  Dog's immunizations are up-to-date, patient thinks her last Tdap was 2019.  Review of Systems  Positive:  Negative:   Physical Exam  LMP 08/22/2008  Gen:   Awake, no distress   Resp:  Normal effort  MSK:   Moves extremities without difficulty, large open wounds noted at the left thigh, right tib-fib Other:    Medical Decision Making  Medically screening exam initiated at 3:45 PM.  Appropriate orders placed.  [name] was informed that the remainder of the evaluation will be completed by another provider, this initial triage assessment does not replace that evaluation, and the importance of remaining in the ED until their evaluation is complete.     Versie Starks, PA-C 08/11/22 1546

## 2022-08-11 NOTE — Assessment & Plan Note (Signed)
Description classic for vasovagal, likely multifactorial due to pain and rapidly worsening anemia.  Low suspicion for cardiac etiology given lack of other symptoms.  Continue to monitor.

## 2022-08-11 NOTE — ED Notes (Signed)
Wound noted to right lower leg. Left upper leg

## 2022-08-11 NOTE — ED Notes (Signed)
Pt given meds for emesis

## 2022-08-11 NOTE — Assessment & Plan Note (Signed)
Somewhat rapid progression of hemoglobin drop during her stay in the ED, concern for expanding hematoma.  At present exam is reassuring.  Managing without any evidence of arterial damage. - Trend CBC every 6 hours

## 2022-08-11 NOTE — ED Notes (Signed)
Dr Archie Balboa notified bp 82/52

## 2022-08-11 NOTE — ED Triage Notes (Signed)
Pt arrives with c/o dog bite to right lower leg and left upper. DOg is a family pet and up to date with immunizations.

## 2022-08-11 NOTE — Assessment & Plan Note (Signed)
Lupus-continue hydroxychloroquine Hypertension-continue chlorthalidone, enalapril Insomnia-continue trazodone as needed

## 2022-08-11 NOTE — ED Notes (Signed)
1st liter LR completed. Bp improvement. Pt to CT

## 2022-08-11 NOTE — Assessment & Plan Note (Signed)
Suspect secondary to brief period of hypotension causing demand, in addition to reduced clearance related to her chronic kidney disease. - Trend troponin to peak - Telemetry

## 2022-08-11 NOTE — ED Notes (Signed)
ED Provider at bedside. 

## 2022-08-12 DIAGNOSIS — T148XXA Other injury of unspecified body region, initial encounter: Secondary | ICD-10-CM | POA: Diagnosis not present

## 2022-08-12 LAB — CBC
HCT: 24 % — ABNORMAL LOW (ref 36.0–46.0)
HCT: 24.6 % — ABNORMAL LOW (ref 36.0–46.0)
HCT: 25.2 % — ABNORMAL LOW (ref 36.0–46.0)
Hemoglobin: 8.1 g/dL — ABNORMAL LOW (ref 12.0–15.0)
Hemoglobin: 8.4 g/dL — ABNORMAL LOW (ref 12.0–15.0)
Hemoglobin: 8.4 g/dL — ABNORMAL LOW (ref 12.0–15.0)
MCH: 33.1 pg (ref 26.0–34.0)
MCH: 33.1 pg (ref 26.0–34.0)
MCH: 33.7 pg (ref 26.0–34.0)
MCHC: 33.3 g/dL (ref 30.0–36.0)
MCHC: 33.8 g/dL (ref 30.0–36.0)
MCHC: 34.1 g/dL (ref 30.0–36.0)
MCV: 98 fL (ref 80.0–100.0)
MCV: 98.8 fL (ref 80.0–100.0)
MCV: 99.2 fL (ref 80.0–100.0)
Platelets: 385 10*3/uL (ref 150–400)
Platelets: 394 10*3/uL (ref 150–400)
Platelets: 406 10*3/uL — ABNORMAL HIGH (ref 150–400)
RBC: 2.45 MIL/uL — ABNORMAL LOW (ref 3.87–5.11)
RBC: 2.49 MIL/uL — ABNORMAL LOW (ref 3.87–5.11)
RBC: 2.54 MIL/uL — ABNORMAL LOW (ref 3.87–5.11)
RDW: 12.8 % (ref 11.5–15.5)
RDW: 13 % (ref 11.5–15.5)
RDW: 13.1 % (ref 11.5–15.5)
WBC: 11.5 10*3/uL — ABNORMAL HIGH (ref 4.0–10.5)
WBC: 12.9 10*3/uL — ABNORMAL HIGH (ref 4.0–10.5)
WBC: 14 10*3/uL — ABNORMAL HIGH (ref 4.0–10.5)
nRBC: 0 % (ref 0.0–0.2)
nRBC: 0 % (ref 0.0–0.2)
nRBC: 0 % (ref 0.0–0.2)

## 2022-08-12 LAB — BASIC METABOLIC PANEL
Anion gap: 7 (ref 5–15)
BUN: 26 mg/dL — ABNORMAL HIGH (ref 8–23)
CO2: 23 mmol/L (ref 22–32)
Calcium: 8.6 mg/dL — ABNORMAL LOW (ref 8.9–10.3)
Chloride: 108 mmol/L (ref 98–111)
Creatinine, Ser: 1.56 mg/dL — ABNORMAL HIGH (ref 0.44–1.00)
GFR, Estimated: 36 mL/min — ABNORMAL LOW (ref >60)
Glucose, Bld: 105 mg/dL — ABNORMAL HIGH (ref 70–99)
Potassium: 4.2 mmol/L (ref 3.5–5.1)
Sodium: 138 mmol/L (ref 135–145)

## 2022-08-12 LAB — TROPONIN I (HIGH SENSITIVITY): Troponin I (High Sensitivity): 30 ng/L — ABNORMAL HIGH (ref <18)

## 2022-08-12 LAB — HIV ANTIBODY (ROUTINE TESTING W REFLEX): HIV Screen 4th Generation wRfx: NONREACTIVE

## 2022-08-12 MED ORDER — ENALAPRIL MALEATE 20 MG PO TABS: 10.0000 mg | ORAL_TABLET | Freq: Two times a day (BID) | ORAL | Status: DC

## 2022-08-12 MED ORDER — AMOXICILLIN-POT CLAVULANATE 875-125 MG PO TABS: 1.0000 | ORAL_TABLET | Freq: Two times a day (BID) | ORAL | 0 refills | Status: AC

## 2022-08-12 MED ORDER — ENALAPRIL MALEATE 10 MG PO TABS
10.0000 mg | ORAL_TABLET | Freq: Two times a day (BID) | ORAL | Status: DC
  Administered 2022-08-12: 10 mg via ORAL
  Filled 2022-08-12: qty 1

## 2022-08-12 MED ORDER — OXYCODONE HCL 5 MG PO TABS: 5.0000 mg | ORAL_TABLET | Freq: Four times a day (QID) | ORAL | 0 refills | Status: DC | PRN

## 2022-08-12 NOTE — Hospital Course (Addendum)
[name] is a 66 y.o. female with history of Lupus with renal involvement, CKD stage III, who presents after being attacked by a dog. Of note patient was seen day prior to ED arrival at internal medicine clinic, at that time she reported that she had fallen the day prior onto a metal cart and sustained a laceration to her left upper thigh.  A 1.5 cm laceration was sutured, her last tetanus was noted to be given in 2019, and she was sent home with a prescription for doxycycline. 10/21 - in ED in the waiting room she had an episode of feeling very hot, dizzy, flushed, mildly hypotensive in the 80s over 50s, significant hematoma over left lower extremity, concern for possible arterial involvement. WBC 12.4 and Hgb of 9.4, down from her baseline of around 11 a few months prior.  Repeat of her hemoglobin 4 hours later was 8.4, and a rise WBC to 17.  CT demonstrated puncture wounds however NO evidence of arterial injury. Kept for observation hematoma and monitoring H/H.  10/22: slightly worse Cr, WBC improving, Hgb stable at 8.4. Ambulated with some soreness but no serious difficulty. Wounds examined, rebandaged. Wrapped L thigh w/ light compression.      Consultants:  none  Procedures: none      ASSESSMENT & PLAN:   Principal Problem:   Hematoma Active Problems:   Systemic lupus erythematosus (HCC)   Chronic kidney disease, stage III (moderate) (HCC)   Stage III lupus nephritis (WHO) (HCC)   Syncope, vasovagal   Elevated troponin   Known medical problems   Dog bite of lower leg   Hematoma Anemia - acute on chronic, acute d/t hematoma from dog bite NO arterial involvement on imaging  Trend CBC every 6 hours --> Hgb stable   Dog bite of lower leg Status post suturing in the ED by Dr. Archie Balboa.   Up-to-date on tetanus shot. Continue Augmentin twice daily x10 days total Precautions reviewed   Pre-Syncope, vasovagal Description classic for vasovagal, likely multifactorial  due to pain and rapidly worsening anemia.  Low suspicion for cardiac etiology given lack of other symptoms.   Continue to monitor.  Elevated troponin Suspect secondary to brief period of hypotension causing demand, in addition to reduced clearance related to her chronic kidney disease. Trend troponin to peak --> 20 --> 37 --> 30 Telemetry  Known medical problems Lupus-continue hydroxychloroquine Hypertension-continue chlorthalidone, enalapril Insomnia-continue trazodone as needed

## 2022-08-12 NOTE — Plan of Care (Signed)

## 2022-08-12 NOTE — TOC CM/SW Note (Signed)
Patient has orders to discharge home today. Chart reviewed. No TOC needs identified. CSW signing off.  Darlene Bartelt, CSW [phone]  

## 2022-08-12 NOTE — ED Notes (Signed)
Dressings placed to bilateral wounds post the MD suturing them.

## 2022-08-12 NOTE — Discharge Summary (Signed)
Physician Discharge Summary   Patient: [name] MRN: [id number]  DOB: 05-20-1956   Admit:     Date of Admission: 08/11/2022 Admitted from: home   Discharge: Date of discharge: 08/12/22 Disposition: Home Condition at discharge: good  CODE STATUS: FULL CODE     Discharge Physician: Emeterio Reeve, DO [facility]     PCP: Jon Billings, NP  Recommendations for Outpatient Follow-up:  Follow up with PCP Jon Billings, NP for suture removal / wound evaluation THIS WEEK Suture removal LE no sooner than 10 days    Please obtain labs/tests: CBC, BMP in 1-2 weeks Please follow up on the following pending results: none PCP AND OTHER OUTPATIENT PROVIDERS: SEE BELOW FOR SPECIFIC DISCHARGE INSTRUCTIONS PRINTED FOR PATIENT IN ADDITION TO GENERIC AVS PATIENT INFO    Discharge Instructions     Diet - low sodium heart healthy   Complete by: As directed    Increase activity slowly   Complete by: As directed          Discharge Diagnoses: Principal Problem:   Hematoma Active Problems:   Systemic lupus erythematosus ([place])   Chronic kidney disease, stage III (moderate) (HCC)   Stage III lupus nephritis (WHO) ([place])   Syncope, vasovagal   Elevated troponin   Known medical problems   Dog bite of lower leg       Hospital Course: KYIA RHUDE is a 66 y.o. female with history of Lupus with renal involvement, CKD stage III, who presents after being attacked by a dog. Of note patient was seen day prior to ED arrival at internal medicine clinic, at that time she reported that she had fallen the day prior onto a metal cart and sustained a laceration to her left upper thigh.  A 1.5 cm laceration was sutured, her last tetanus was noted to be given in 2019, and she was sent home with a prescription for doxycycline. 10/21 - in ED in the waiting room she had an episode of feeling very hot, dizzy, flushed, mildly hypotensive in the 80s over 50s,  significant hematoma over left lower extremity, concern for possible arterial involvement. WBC 12.4 and Hgb of 9.4, down from her baseline of around 11 a few months prior.  Repeat of her hemoglobin 4 hours later was 8.4, and a rise WBC to 17.  CT demonstrated puncture wounds however NO evidence of arterial injury. Kept for observation hematoma and monitoring H/H.  10/22: slightly worse Cr, WBC improving, Hgb stable at 8.4. Ambulated with some soreness but no serious difficulty. Wounds examined, rebandaged. Wrapped L thigh w/ light compression.      Consultants:  none  Procedures: none      ASSESSMENT & PLAN:   Principal Problem:   Hematoma Active Problems:   Systemic lupus erythematosus (HCC)   Chronic kidney disease, stage III (moderate) (HCC)   Stage III lupus nephritis (WHO) (HCC)   Syncope, vasovagal   Elevated troponin   Known medical problems   Dog bite of lower leg   Hematoma Anemia - acute on chronic, acute d/t hematoma from dog bite NO arterial involvement on imaging  Trend CBC every 6 hours --> Hgb stable   Dog bite of lower leg Status post suturing in the ED by Dr. Archie Balboa.   Up-to-date on tetanus shot. Continue Augmentin twice daily x10 days total Precautions reviewed   Pre-Syncope, vasovagal Description classic for vasovagal, likely multifactorial due to pain and rapidly worsening anemia.  Low suspicion for cardiac  etiology given lack of other symptoms.   Continue to monitor.  Elevated troponin Suspect secondary to brief period of hypotension causing demand, in addition to reduced clearance related to her chronic kidney disease. Trend troponin to peak --> 20 --> 37 --> 30 Telemetry  Known medical problems Lupus-continue hydroxychloroquine Hypertension-continue chlorthalidone, enalapril Insomnia-continue trazodone as needed               Discharge Instructions  Allergies as of 08/12/2022       Reactions   Elemental Sulfur Hives    Sulfa Antibiotics Hives, Itching, Rash   Beef-derived Products    Stomach issues   Lambs Yisroel Ramming meat-Stomach issues    Milk-related Compounds    Stomach issues        Medication List     STOP taking these medications    aspirin 81 MG tablet   doxycycline 100 MG capsule Commonly known as: VIBRAMYCIN   traMADol 50 MG tablet Commonly known as: ULTRAM       TAKE these medications    amoxicillin-clavulanate 875-125 MG tablet Commonly known as: AUGMENTIN Take 1 tablet by mouth 2 (two) times daily for 19 doses. Start in evening 08/12/22   chlorthalidone 25 MG tablet Commonly known as: HYGROTON Take 12.5 mg by mouth in the morning.   cyanocobalamin 1000 MCG tablet Take 1,000 mcg by mouth daily. Vitamin B12   enalapril 20 MG tablet Commonly known as: VASOTEC Take 0.5 tablets (10 mg total) by mouth 2 (two) times daily. What changed: how much to take   fluticasone 50 MCG/ACT nasal spray Commonly known as: FLONASE Place 2 sprays into both nostrils daily.   hydroxychloroquine 200 MG tablet Commonly known as: PLAQUENIL Take 200 mg by mouth every other day.   Iron 325 (65 Fe) MG Tabs Take 1 tablet by mouth daily.   lipase/protease/amylase 36000 UNITS Cpep capsule Commonly known as: Creon Take 2 capsules with the first bite of each meal and 1 capsule with the first bite of each snack   meclizine 12.5 MG tablet Commonly known as: ANTIVERT Take 1 tablet (12.5 mg total) by mouth 3 (three) times daily as needed for dizziness.   oxyCODONE 5 MG immediate release tablet Commonly known as: Oxy IR/ROXICODONE Take 1 tablet (5 mg total) by mouth every 6 (six) hours as needed for moderate pain.   traZODone 50 MG tablet Commonly known as: DESYREL TAKE 1 TABLET BY MOUTH EVERY DAY AT BEDTIME AS NEEDED FOR SLEEP   VITAMIN D3 PO Take 1 tablet by mouth daily.         Follow-up Information     Jon Billings, NP. Call.   Specialty: Nurse Practitioner Why:  suture removal in 10 days 11/01 but should be seen sometime this week for wound check Contact information: [place] [address] [phone]                 Allergies  Allergen Reactions   Elemental Sulfur Hives   Sulfa Antibiotics Hives, Itching and Rash   Beef-Derived Products     Stomach issues   Lambs Yis[employer] Ramming meat-Stomach issues    Milk-Related Compounds     Stomach issues     Subjective: pt reports soreness but pain overall controlled. NO CP/SOB.    Discharge Exam: BP (!) 140/51 (BP Location: Right Arm)   Pulse 86   Temp 97.7 F (36.5 C)   Resp 14   Ht 5'  6" (1.676 m)   Wt 84.5 kg   LMP 08/22/2008   SpO2 95%   BMI 30.07 kg/m  General: Pt is alert, awake, not in acute distress Cardiovascular: RRR, S1/S2 +, no rubs, no gallops Respiratory: CTA bilaterally, no wheezing, no rhonchi Abdominal: Soft, NT, ND, bowel sounds + Extremities: no edema, no cyanosis Skin: lacerations w/ sutures on RLE below knee and LLE thigh - intact. SIgnificant bruising on L thigh.  CT images personally reviewed     The results of significant diagnostics from this hospitalization (including imaging, microbiology, ancillary and laboratory) are listed below for reference.     Microbiology: No results found for this or any previous visit (from the past 240 hour(s)).   Labs: BNP (last 3 results) No results for input(s): "BNP" in the last 8760 hours. Basic Metabolic Panel: Recent Labs  Lab 08/11/22 1646 08/12/22 0625  NA 134* 138  K 4.2 4.2  CL 106 108  CO2 19* 23  GLUCOSE 118* 105*  BUN 30* 26*  CREATININE 1.47* 1.56*  CALCIUM 8.7* 8.6*   Liver Function Tests: No results for input(s): "AST", "ALT", "ALKPHOS", "BILITOT", "PROT", "ALBUMIN" in the last 168 hours. No results for input(s): "LIPASE", "AMYLASE" in the last 168 hours. No results for input(s): "AMMONIA" in the last 168 hours. CBC: Recent Labs  Lab 08/11/22 1646 08/11/22 2054  08/12/22 0000 08/12/22 0625 08/12/22 1107  WBC 12.4* 17.5* 14.0* 11.5* 12.9*  HGB 9.4* 8.4* 8.4* 8.4* 8.1*  HCT 28.2* 25.2* 25.2* 24.6* 24.0*  MCV 97.6 98.8 99.2 98.8 98.0  PLT 449* 408* 406* 394 385   Cardiac Enzymes: No results for input(s): "CKTOTAL", "CKMB", "CKMBINDEX", "TROPONINI" in the last 168 hours. BNP: Invalid input(s): "POCBNP" CBG: Recent Labs  Lab 08/11/22 1648  GLUCAP 112*   D-Dimer No results for input(s): "DDIMER" in the last 72 hours. Hgb A1c No results for input(s): "HGBA1C" in the last 72 hours. Lipid Profile No results for input(s): "CHOL", "HDL", "LDLCALC", "TRIG", "CHOLHDL", "LDLDIRECT" in the last 72 hours. Thyroid function studies No results for input(s): "TSH", "T4TOTAL", "T3FREE", "THYROIDAB" in the last 72 hours.  Invalid input(s): "FREET3" Anemia work up No results for input(s): "VITAMINB12", "FOLATE", "FERRITIN", "TIBC", "IRON", "RETICCTPCT" in the last 72 hours. Urinalysis    Component Value Date/Time   APPEARANCEUR Clear 07/31/2022 1454   GLUCOSEU Negative 07/31/2022 1454   BILIRUBINUR Negative 07/31/2022 1454   PROTEINUR 1+ (A) 07/31/2022 1454   UROBILINOGEN negative 03/22/2015 0945   NITRITE Negative 07/31/2022 1454   LEUKOCYTESUR 1+ (A) 07/31/2022 1454   Sepsis Labs Recent Labs  Lab 08/11/22 2054 08/12/22 0000 08/12/22 0625 08/12/22 1107  WBC 17.5* 14.0* 11.5* 12.9*   Microbiology No results found for this or any previous visit (from the past 240 hour(s)). Imaging CT ANGIO AO+BIFEM W & OR WO CONTRAST  Result Date: 08/11/2022 CLINICAL DATA:  Dog bite.  Left lower extremity wound. EXAM: CT ANGIOGRAPHY OF ABDOMINAL AORTA WITH ILIOFEMORAL RUNOFF TECHNIQUE: Multidetector CT imaging of the abdomen, pelvis and lower extremities was performed using the standard protocol during bolus administration of intravenous contrast. Multiplanar CT image reconstructions and MIPs were obtained to evaluate the vascular anatomy. RADIATION DOSE  REDUCTION: This exam was performed according to the departmental dose-optimization program which includes automated exposure control, adjustment of the mA and/or kV according to patient size and/or use of iterative reconstruction technique. CONTRAST:  180m OMNIPAQUE IOHEXOL 350 MG/ML SOLN COMPARISON:  None Available. FINDINGS: VASCULAR Aorta: Normal caliber aorta without aneurysm, dissection,  vasculitis or significant stenosis. RIGHT Lower Extremity Inflow: Common, internal and external iliac arteries are patent without evidence of aneurysm, dissection, vasculitis or significant stenosis. Outflow: Common, superficial and profunda femoral arteries and the popliteal artery are patent without evidence of aneurysm, dissection, vasculitis or significant stenosis. Runoff: Patent three vessel runoff to the ankle. LEFT Lower Extremity Inflow: Common, internal and external iliac arteries are patent without evidence of aneurysm, dissection, vasculitis or significant stenosis. Outflow: Common, superficial and profunda femoral arteries and the popliteal artery are patent without evidence of aneurysm, dissection, vasculitis or significant stenosis. Runoff: Patent three vessel runoff to the ankle. Veins: No obvious venous abnormality within the limitations of this arterial phase study. Review of the MIP images confirms the above findings. NON-VASCULAR Adrenals/Urinary Tract: The distal ureters and urinary bladder are within normal limits. Stomach/Bowel: Diverticular changes are present in the distal descending and sigmoid colon without focal inflammation to suggest diverticulitis. The appendix is visualized and normal. Visualized small bowel is otherwise unremarkable. Lymphatic: No significant adenopathy is present. Reproductive: Uterus and bilateral adnexa are unremarkable. Other: No abdominal wall hernia or abnormality. No abdominopelvic ascites. Musculoskeletal: Multiple puncture wounds are present in the anterior left thigh.  Subcutaneous hematoma is present. No foreign body is present. There is no penetration of the deep fascia. Soft tissue wound is noted along the anteromedial aspect of the right knee. No penetration of the deep fascia. No foreign body. IMPRESSION: 1. Multiple puncture wounds in the anterior left thigh without penetration of the deep fascia. 2. Soft tissue wound along the anteromedial aspect of the right knee without penetration of the deep fascia. 3. No foreign body. 4. No acute or focal arterial injury. 5. Colonic diverticulosis without diverticulitis. Electronically Signed   By: San Morelle M.D.   On: 08/11/2022 18:08   DG Tibia/Fibula Right  Result Date: 08/11/2022 CLINICAL DATA:  Dog bite to right lower leg EXAM: RIGHT TIBIA AND FIBULA - 2 VIEW COMPARISON:  None Available. FINDINGS: There is no evidence of fracture or other focal bone lesions. Subcutaneous emphysema at the medial knee. Apparent soft tissue defect anterior to the proximal tibia. No radiopaque foreign body. Subcutaneous soft tissue radiodensities along the anterior distal tibia and medial malleolus likely reflects soft tissue granulomata. Plantar calcaneal spur. IMPRESSION: 1. No acute osseous abnormality.  No radiopaque foreign body. 2. Subcutaneous emphysema at the medial knee and apparent soft tissue defect anterior to the proximal tibia. Electronically Signed   By: Darrin Nipper M.D.   On: 08/11/2022 16:33   DG Femur Min 2 Views Left  Result Date: 08/11/2022 CLINICAL DATA:  Dog bite.  Left leg pain. EXAM: LEFT FEMUR 2 VIEWS COMPARISON:  None Available. FINDINGS: Minimal degenerative changes are present at the knee. No acute bone or soft tissue abnormalities are present. Mild diffuse edema is present within the soft tissues. IMPRESSION: 1. Mild diffuse edema within the soft tissues. 2. No acute osseous abnormality. Electronically Signed   By: San Morelle M.D.   On: 08/11/2022 16:30      Time coordinating discharge:  over 30 minutes  SIGNED:  Emeterio Reeve DO [facility]

## 2022-08-13 ENCOUNTER — Telehealth: Payer: Self-pay | Admitting: *Deleted

## 2022-08-13 NOTE — Telephone Encounter (Signed)
Pt has been scheduled for hosp follow up on Wedn. 10/25

## 2022-08-13 NOTE — Telephone Encounter (Signed)
Transition Care Management Unsuccessful Follow-up Telephone Call  Date of discharge and from where:  [facility] 08-12-2022  Attempts:  1st Attempt  Reason for unsuccessful TCM follow-up call:  Left voice message  Patient is scheduled for hospital follow up 08-15-2022 at 3:20

## 2022-08-15 ENCOUNTER — Encounter: Payer: Self-pay | Admitting: Nurse Practitioner

## 2022-08-15 ENCOUNTER — Ambulatory Visit: Payer: Medicare Other | Admitting: Nurse Practitioner

## 2022-08-15 VITALS — BP 98/50 | HR 96 | Temp 98.3°F | Ht 66.0 in | Wt 189.4 lb

## 2022-08-15 DIAGNOSIS — M3214 Glomerular disease in systemic lupus erythematosus: Secondary | ICD-10-CM | POA: Diagnosis not present

## 2022-08-15 DIAGNOSIS — W540XXA Bitten by dog, initial encounter: Secondary | ICD-10-CM

## 2022-08-15 DIAGNOSIS — D75839 Thrombocytosis, unspecified: Secondary | ICD-10-CM | POA: Diagnosis not present

## 2022-08-15 DIAGNOSIS — N1831 Chronic kidney disease, stage 3a: Secondary | ICD-10-CM | POA: Diagnosis not present

## 2022-08-15 DIAGNOSIS — S81852A Open bite, left lower leg, initial encounter: Secondary | ICD-10-CM | POA: Diagnosis not present

## 2022-08-15 NOTE — Assessment & Plan Note (Signed)
Chronic, ongoing.  Followed by [facility] nephrology, continue this collaboration.  Recent note reviewed and medication change.  Check BMP today.

## 2022-08-15 NOTE — Assessment & Plan Note (Signed)
Chronic, ongoing.  Followed by [facility] nephrology.  Recent note reviewed.  Recheck BMP today.

## 2022-08-15 NOTE — Assessment & Plan Note (Signed)
To bilateral lower legs -- areas not completely healed at this time -- will plan on suture removal at physical next week to allow for more time for healing and avoid opening of wounds.  Continue abx therapy at home and recommend she place abx ointment to areas during wound care.  She is performing wound care BID.  If any worsening symptoms alert provider immediately.

## 2022-08-15 NOTE — Progress Notes (Signed)
BP (!) 98/50   Pulse 96   Temp 98.3 F (36.8 C) (Oral)   Ht '5\' 6"'  (1.676 m)   Wt 189 lb 6.4 oz (85.9 kg)   LMP 08/22/2008   SpO2 99%   BMI 30.57 kg/m    Subjective:    Patient ID: [name], female    DOB: May 24, 1956, 66 y.o.   MRN: [id number]  HPI: [name] is a 66 y.o. female  Chief Complaint  Patient presents with   Animal Bite    Patient is here for a Dog Bite with hospitalization on 08/11/2022 and was discharged on 08/12/2022. Patient is here for a Wound Check. Patient says her L left still feels "swollen and tight." Patient says when she was discharged from the hospital that her bandaging was wrapped too tight and she took it off.    Transition of Care Hospital Follow up.  Admitted to [facility] on 08/11/22 and discharged 08/12/22 due to dog bite.  She reports being outside and was playing with grand dog -- he was on tie out rope.  She turned to walk away to leave when she tripped, then he bit her on lower right leg and upper right leg.  She panicked and started screaming, daughter-in-law pulled dog away from her.  She continues on Augmentin and Oxycodone -- which she was discharged with.  Has baseline anemia, is followed by hematology with last visit 03/28/22 -- she takes iron and B12 by mouth + B12 injections -- to return to see them in December.  Follows with GI due to anemia and diarrhea -- last saw 05/03/22.  Has underlying Lupus and is followed by [facility] rheumatology with Plaquenil on board, this is on hold at this time as is ASA.  She follows with [facility] nephrology, last saw 06/26/22, for Lupus nephritis -- changed Enalapril to 10 MG BID.  "Hospital Course: [name] is a 66 y.o. female with history of Lupus with renal involvement, CKD stage III, who presents after being attacked by a dog. Of note patient was seen day prior to ED arrival at internal medicine clinic, at that time she reported that she had fallen the day prior onto a  metal cart and sustained a laceration to her left upper thigh.  A 1.5 cm laceration was sutured, her last tetanus was noted to be given in 2019, and she was sent home with a prescription for doxycycline. 10/21 - in ED in the waiting room she had an episode of feeling very hot, dizzy, flushed, mildly hypotensive in the 80s over 50s, significant hematoma over left lower extremity, concern for possible arterial involvement. WBC 12.4 and Hgb of 9.4, down from her baseline of around 11 a few months prior.  Repeat of her hemoglobin 4 hours later was 8.4, and a rise WBC to 17.  CT demonstrated puncture wounds however NO evidence of arterial injury. Kept for observation hematoma and monitoring H/H.  10/22: slightly worse Cr, WBC improving, Hgb stable at 8.4. Ambulated with some soreness but no serious difficulty. Wounds examined, rebandaged. Wrapped L thigh w/ light compression."  Hospital/Facility: [facility] D/C Physician: Emeterio Reeve DO D/C Date: 08/12/22  Records Requested: 08/15/22 Records Received: 08/15/22 Records Reviewed: 08/15/22  Diagnoses on Discharge: Hematoma  Date of interactive Contact within 48 hours of discharge:  Contact was through: phone  Date of 7 day or 14 day face-to-face visit:    within 7 days  Outpatient Encounter Medications as of 08/15/2022  Medication Sig   amoxicillin-clavulanate (AUGMENTIN) 875-125 MG tablet Take 1 tablet by mouth 2 (two) times daily for 19 doses. Start in evening 08/12/22   chlorthalidone (HYGROTON) 25 MG tablet Take 12.5 mg by mouth in the morning.   Cholecalciferol (VITAMIN D3 PO) Take 1 tablet by mouth daily.    cyanocobalamin 1000 MCG tablet Take 1,000 mcg by mouth daily. Vitamin B12   enalapril (VASOTEC) 20 MG tablet Take 0.5 tablets (10 mg total) by mouth 2 (two) times daily.   Ferrous Sulfate (IRON) 325 (65 Fe) MG TABS Take 1 tablet by mouth daily.   fluticasone (FLONASE) 50 MCG/ACT nasal spray Place 2 sprays into both nostrils daily.    hydroxychloroquine (PLAQUENIL) 200 MG tablet Take 200 mg by mouth every other day.    lipase/protease/amylase (CREON) 36000 UNITS CPEP capsule Take 2 capsules with the first bite of each meal and 1 capsule with the first bite of each snack   meclizine (ANTIVERT) 12.5 MG tablet Take 1 tablet (12.5 mg total) by mouth 3 (three) times daily as needed for dizziness.   oxyCODONE (OXY IR/ROXICODONE) 5 MG immediate release tablet Take 1 tablet (5 mg total) by mouth every 6 (six) hours as needed for moderate pain.   traZODone (DESYREL) 50 MG tablet TAKE 1 TABLET BY MOUTH EVERY DAY AT BEDTIME AS NEEDED FOR SLEEP   [DISCONTINUED] doxycycline (VIBRAMYCIN) 100 MG capsule Take 1 capsule by mouth 2 (two) times daily. (Patient not taking: Reported on 08/15/2022)   No facility-administered encounter medications on file as of 08/15/2022.    Diagnostic Tests Reviewed/Disposition: Reviewed on chart -- anemia noted with discharge labs H/H 8.1/24.0, WBC 12.9, creatinine 1.56, eGFR 36  Imaging on chart reviewed -- had CT Angio and x-rays  Consults: None  Discharge Instructions: Follow-up with PCP  Disease/illness Education: Reviewed with patient today at bedside  Home Health/Community Services Discussions/Referrals: None  Establishment or re-establishment of referral orders for community resources: None  Discussion with other health care providers: Reviewed recent notes   Assessment and Support of treatment regimen adherence: Reviewed with patient today at bedside  Appointments Coordinated with: Reviewed with patient today at bedside  Education for self-management, independent living, and ADLs:  Reviewed with patient today at bedside  Relevant past medical, surgical, family and social history reviewed and updated as indicated. Interim medical history since our last visit reviewed. Allergies and medications reviewed and updated.  Review of Systems  Constitutional:  Positive for fatigue. Negative for  activity change, appetite change, chills and fever.  Respiratory: Negative.    Cardiovascular: Negative.   Musculoskeletal:  Positive for arthralgias.  Skin:  Positive for wound.  Neurological: Negative.   Psychiatric/Behavioral: Negative.      Per HPI unless specifically indicated above     Objective:    BP (!) 98/50   Pulse 96   Temp 98.3 F (36.8 C) (Oral)   Ht '5\' 6"'  (1.676 m)   Wt 189 lb 6.4 oz (85.9 kg)   LMP 08/22/2008   SpO2 99%   BMI 30.57 kg/m   Wt Readings from Last 3 Encounters:  08/15/22 189 lb 6.4 oz (85.9 kg)  08/12/22 186 lb 4.6 oz (84.5 kg)  07/31/22 184 lb 3.2 oz (83.6 kg)    Physical Exam Vitals and nursing note reviewed.  Constitutional:      General: She is awake. She is not in acute distress.    Appearance: She is well-developed and well-groomed. She is obese. She is not ill-appearing or  toxic-appearing.  HENT:     Head: Normocephalic.     Right Ear: Hearing normal.     Left Ear: Hearing normal.  Eyes:     General: Lids are normal.        Right eye: No discharge.        Left eye: No discharge.     Conjunctiva/sclera: Conjunctivae normal.     Pupils: Pupils are equal, round, and reactive to light.  Neck:     Thyroid: No thyromegaly.     Vascular: No carotid bruit.  Cardiovascular:     Rate and Rhythm: Normal rate and regular rhythm.     Heart sounds: Normal heart sounds. No murmur heard.    No gallop.  Pulmonary:     Effort: Pulmonary effort is normal. No accessory muscle usage or respiratory distress.     Breath sounds: Normal breath sounds.  Abdominal:     General: Bowel sounds are normal.     Palpations: Abdomen is soft.  Musculoskeletal:     Cervical back: Normal range of motion and neck supple.     Right lower leg: No edema.     Left lower leg: No edema.  Lymphadenopathy:     Cervical: No cervical adenopathy.  Skin:    General: Skin is warm and dry.       Neurological:     Mental Status: She is alert and oriented to person,  place, and time.  Psychiatric:        Attention and Perception: Attention normal.        Mood and Affect: Mood normal.        Speech: Speech normal.        Behavior: Behavior normal. Behavior is cooperative.        Thought Content: Thought content normal.    Results for orders placed or performed during the hospital encounter of 08/11/22  CBC  Result Value Ref Range   WBC 12.4 (H) 4.0 - 10.5 K/uL   RBC 2.89 (L) 3.87 - 5.11 MIL/uL   Hemoglobin 9.4 (L) 12.0 - 15.0 g/dL   HCT 28.2 (L) 36.0 - 46.0 %   MCV 97.6 80.0 - 100.0 fL   MCH 32.5 26.0 - 34.0 pg   MCHC 33.3 30.0 - 36.0 g/dL   RDW 12.8 11.5 - 15.5 %   Platelets 449 (H) 150 - 400 K/uL   nRBC 0.0 0.0 - 0.2 %  Basic metabolic panel  Result Value Ref Range   Sodium 134 (L) 135 - 145 mmol/L   Potassium 4.2 3.5 - 5.1 mmol/L   Chloride 106 98 - 111 mmol/L   CO2 19 (L) 22 - 32 mmol/L   Glucose, Bld 118 (H) 70 - 99 mg/dL   BUN 30 (H) 8 - 23 mg/dL   Creatinine, Ser 1.47 (H) 0.44 - 1.00 mg/dL   Calcium 8.7 (L) 8.9 - 10.3 mg/dL   GFR, Estimated 39 (L) >60 mL/min   Anion gap 9 5 - 15  Protime-INR  Result Value Ref Range   Prothrombin Time 13.9 11.4 - 15.2 seconds   INR 1.1 0.8 - 1.2  CBC  Result Value Ref Range   WBC 17.5 (H) 4.0 - 10.5 K/uL   RBC 2.55 (L) 3.87 - 5.11 MIL/uL   Hemoglobin 8.4 (L) 12.0 - 15.0 g/dL   HCT 25.2 (L) 36.0 - 46.0 %   MCV 98.8 80.0 - 100.0 fL   MCH 32.9 26.0 - 34.0 pg   MCHC 33.3  30.0 - 36.0 g/dL   RDW 12.8 11.5 - 15.5 %   Platelets 408 (H) 150 - 400 K/uL   nRBC 0.0 0.0 - 0.2 %  HIV Antibody (routine testing w rflx)  Result Value Ref Range   HIV Screen 4th Generation wRfx Non Reactive Non Reactive  CBC  Result Value Ref Range   WBC 14.0 (H) 4.0 - 10.5 K/uL   RBC 2.54 (L) 3.87 - 5.11 MIL/uL   Hemoglobin 8.4 (L) 12.0 - 15.0 g/dL   HCT 25.2 (L) 36.0 - 46.0 %   MCV 99.2 80.0 - 100.0 fL   MCH 33.1 26.0 - 34.0 pg   MCHC 33.3 30.0 - 36.0 g/dL   RDW 12.8 11.5 - 15.5 %   Platelets 406 (H) 150 - 400  K/uL   nRBC 0.0 0.0 - 0.2 %  CBC  Result Value Ref Range   WBC 11.5 (H) 4.0 - 10.5 K/uL   RBC 2.49 (L) 3.87 - 5.11 MIL/uL   Hemoglobin 8.4 (L) 12.0 - 15.0 g/dL   HCT 24.6 (L) 36.0 - 46.0 %   MCV 98.8 80.0 - 100.0 fL   MCH 33.7 26.0 - 34.0 pg   MCHC 34.1 30.0 - 36.0 g/dL   RDW 13.1 11.5 - 15.5 %   Platelets 394 150 - 400 K/uL   nRBC 0.0 0.0 - 0.2 %  CBC  Result Value Ref Range   WBC 12.9 (H) 4.0 - 10.5 K/uL   RBC 2.45 (L) 3.87 - 5.11 MIL/uL   Hemoglobin 8.1 (L) 12.0 - 15.0 g/dL   HCT 24.0 (L) 36.0 - 46.0 %   MCV 98.0 80.0 - 100.0 fL   MCH 33.1 26.0 - 34.0 pg   MCHC 33.8 30.0 - 36.0 g/dL   RDW 13.0 11.5 - 15.5 %   Platelets 385 150 - 400 K/uL   nRBC 0.0 0.0 - 0.2 %  Basic metabolic panel  Result Value Ref Range   Sodium 138 135 - 145 mmol/L   Potassium 4.2 3.5 - 5.1 mmol/L   Chloride 108 98 - 111 mmol/L   CO2 23 22 - 32 mmol/L   Glucose, Bld 105 (H) 70 - 99 mg/dL   BUN 26 (H) 8 - 23 mg/dL   Creatinine, Ser 1.56 (H) 0.44 - 1.00 mg/dL   Calcium 8.6 (L) 8.9 - 10.3 mg/dL   GFR, Estimated 36 (L) >60 mL/min   Anion gap 7 5 - 15  CBG monitoring, ED  Result Value Ref Range   Glucose-Capillary 112 (H) 70 - 99 mg/dL  Type and screen [place]  Result Value Ref Range   ABO/RH(D) O POS    Antibody Screen NEG    Sample Expiration      08/14/2022,2359 Performed at [facility], [address] [place], [place] [address]   Troponin I (High Sensitivity)  Result Value Ref Range   Troponin I (High Sensitivity) 20 (H) <18 ng/L  Troponin I (High Sensitivity)  Result Value Ref Range   Troponin I (High Sensitivity) 37 (H) <18 ng/L  Troponin I (High Sensitivity)  Result Value Ref Range   Troponin I (High Sensitivity) 30 (H) <18 ng/L      Assessment & Plan:   Problem List Items Addressed This Visit       Genitourinary   Stage 3a chronic kidney disease (CKD) (HCC)    Chronic, ongoing.  Followed by [facility] nephrology.  Recent note reviewed.  Recheck  BMP today.  Stage III lupus nephritis (WHO) (HCC)    Chronic, ongoing.  Followed by [facility] nephrology, continue this collaboration.  Recent note reviewed and medication change.  Check BMP today.        Other   Dog bite of lower leg    To bilateral lower legs -- areas not completely healed at this time -- will plan on suture removal at physical next week to allow for more time for healing and avoid opening of wounds.  Continue abx therapy at home and recommend she place abx ointment to areas during wound care.  She is performing wound care BID.  If any worsening symptoms alert provider immediately.      Systemic lupus erythematosus (HCC) - Primary    Chronic, ongoing.  Followed by rheumatology and hematology.  Recent labs on discharge showed lower H/H -- will plan recheck today and if HGB remains low will plan to reach out to hematology to see if needs to be seen sooner then December.  Currently no symptoms of anemia, other then fatigue.      Relevant Orders   Basic metabolic panel   CBC with Differential/Platelet   Iron Binding Cap (TIBC)([facility])   Ferritin     Follow up plan: Return in about 1 week (around 08/22/2022) for as scheduled 08/11/22 for physical, she is okay with suture removal that day (office visit charge).

## 2022-08-15 NOTE — Patient Instructions (Signed)

## 2022-08-15 NOTE — Assessment & Plan Note (Signed)
Chronic, ongoing.  Followed by rheumatology and hematology.  Recent labs on discharge showed lower H/H -- will plan recheck today and if HGB remains low will plan to reach out to hematology to see if needs to be seen sooner then December.  Currently no symptoms of anemia, other then fatigue.

## 2022-08-16 ENCOUNTER — Telehealth: Payer: Self-pay | Admitting: Internal Medicine

## 2022-08-16 LAB — IRON AND TIBC
Iron Saturation: 5 % — CL (ref 15–55)
Iron: 13 ug/dL — ABNORMAL LOW (ref 27–139)
Total Iron Binding Capacity: 250 ug/dL (ref 250–450)
UIBC: 237 ug/dL (ref 118–369)

## 2022-08-16 LAB — BASIC METABOLIC PANEL
BUN/Creatinine Ratio: 17 (ref 12–28)
BUN: 27 mg/dL (ref 8–27)
CO2: 24 mmol/L (ref 20–29)
Calcium: 9 mg/dL (ref 8.7–10.3)
Chloride: 100 mmol/L (ref 96–106)
Creatinine, Ser: 1.57 mg/dL — ABNORMAL HIGH (ref 0.57–1.00)
Glucose: 113 mg/dL — ABNORMAL HIGH (ref 70–99)
Potassium: 4.5 mmol/L (ref 3.5–5.2)
Sodium: 137 mmol/L (ref 134–144)
eGFR: 36 mL/min/{1.73_m2} — ABNORMAL LOW (ref >59)

## 2022-08-16 LAB — FERRITIN: Ferritin: 219 ng/mL — ABNORMAL HIGH (ref 15–150)

## 2022-08-16 LAB — CBC WITH DIFFERENTIAL/PLATELET
Basophils Absolute: 0.1 10*3/uL (ref 0.0–0.2)
Basos: 1 %
EOS (ABSOLUTE): 0 10*3/uL (ref 0.0–0.4)
Eos: 0 %
Hematocrit: 23.1 % — ABNORMAL LOW (ref 34.0–46.6)
Hemoglobin: 8 g/dL — ABNORMAL LOW (ref 11.1–15.9)
Immature Grans (Abs): 0 10*3/uL (ref 0.0–0.1)
Immature Granulocytes: 0 %
Lymphocytes Absolute: 2.8 10*3/uL (ref 0.7–3.1)
Lymphs: 20 %
MCH: 33.5 pg — ABNORMAL HIGH (ref 26.6–33.0)
MCHC: 34.6 g/dL (ref 31.5–35.7)
MCV: 97 fL (ref 79–97)
Monocytes Absolute: 2.2 10*3/uL — ABNORMAL HIGH (ref 0.1–0.9)
Monocytes: 15 %
Neutrophils Absolute: 9.1 10*3/uL — ABNORMAL HIGH (ref 1.4–7.0)
Neutrophils: 64 %
Platelets: 474 10*3/uL — ABNORMAL HIGH (ref 150–450)
RBC: 2.39 x10E6/uL — CL (ref 3.77–5.28)
RDW: 11.9 % (ref 11.7–15.4)
WBC: 14.3 10*3/uL — ABNORMAL HIGH (ref 3.4–10.8)

## 2022-08-16 NOTE — Telephone Encounter (Signed)
Per chat-   We can get him into see Dr. Rogue Bussing next week with lab and IV iron.  Thanks!  Ok to schedule with APP- per [employer]

## 2022-08-16 NOTE — Progress Notes (Signed)
Good morning please let [name] know her labs have returned, please call to ensure she received below message: Good morning [name], your labs have returned: - CBC shows hemoglobin and hematocrit trending down a little more to 8 and 23.1 + iron level low at 13.  I reached out to hematology about your labs, Dr. Grayland Ormond advised staff to schedule you in office next week and they will be calling today -- for repeat labs and possible IV iron in clinic.  If you start feeling bad over weekend with increased fatigue or shortness of breath immediately go to ER.  Continue your antibiotic therapy and resting. - Kidney function continues to show baseline kidney disease with no worsening, continue visits with kidney provider at [facility].  Please ensure to listen out for phone as hematology will call today and ensure to keep visit with [name] next week.  Any questions? Keep being amazing!!  Thank you for allowing me to participate in your care.  I appreciate you. Kindest regards, Natia Fahmy

## 2022-08-16 NOTE — Progress Notes (Signed)
FYI for you -- I did secure chat with Dr. Grayland Ormond and hematology is going to reach out to patient to get her scheduled for next week for lab recheck and possible IV iron.

## 2022-08-17 ENCOUNTER — Other Ambulatory Visit: Payer: Self-pay | Admitting: Medical Oncology

## 2022-08-20 ENCOUNTER — Inpatient Hospital Stay: Payer: Medicare Other

## 2022-08-20 ENCOUNTER — Inpatient Hospital Stay: Payer: Medicare Other | Attending: Internal Medicine

## 2022-08-20 ENCOUNTER — Inpatient Hospital Stay (HOSPITAL_BASED_OUTPATIENT_CLINIC_OR_DEPARTMENT_OTHER): Payer: Medicare Other | Admitting: Medical Oncology

## 2022-08-20 ENCOUNTER — Other Ambulatory Visit: Payer: Self-pay

## 2022-08-20 ENCOUNTER — Encounter: Payer: Self-pay | Admitting: Medical Oncology

## 2022-08-20 VITALS — BP 128/58 | HR 88 | Temp 98.3°F | Resp 17 | Wt 187.0 lb

## 2022-08-20 DIAGNOSIS — D519 Vitamin B12 deficiency anemia, unspecified: Secondary | ICD-10-CM

## 2022-08-20 DIAGNOSIS — D72821 Monocytosis (symptomatic): Secondary | ICD-10-CM

## 2022-08-20 DIAGNOSIS — D509 Iron deficiency anemia, unspecified: Secondary | ICD-10-CM

## 2022-08-20 DIAGNOSIS — D649 Anemia, unspecified: Secondary | ICD-10-CM

## 2022-08-20 DIAGNOSIS — E538 Deficiency of other specified B group vitamins: Secondary | ICD-10-CM | POA: Insufficient documentation

## 2022-08-20 DIAGNOSIS — W540XXD Bitten by dog, subsequent encounter: Secondary | ICD-10-CM

## 2022-08-20 DIAGNOSIS — D75839 Thrombocytosis, unspecified: Secondary | ICD-10-CM

## 2022-08-20 DIAGNOSIS — N183 Chronic kidney disease, stage 3 unspecified: Secondary | ICD-10-CM | POA: Diagnosis not present

## 2022-08-20 DIAGNOSIS — D631 Anemia in chronic kidney disease: Secondary | ICD-10-CM | POA: Diagnosis not present

## 2022-08-20 LAB — IRON AND TIBC
Iron: 44 ug/dL (ref 28–170)
Saturation Ratios: 15 % (ref 10.4–31.8)
TIBC: 287 ug/dL (ref 250–450)
UIBC: 243 ug/dL

## 2022-08-20 LAB — BASIC METABOLIC PANEL
Anion gap: 5 (ref 5–15)
BUN: 21 mg/dL (ref 8–23)
CO2: 24 mmol/L (ref 22–32)
Calcium: 8.7 mg/dL — ABNORMAL LOW (ref 8.9–10.3)
Chloride: 105 mmol/L (ref 98–111)
Creatinine, Ser: 1.44 mg/dL — ABNORMAL HIGH (ref 0.44–1.00)
GFR, Estimated: 40 mL/min — ABNORMAL LOW (ref >60)
Glucose, Bld: 130 mg/dL — ABNORMAL HIGH (ref 70–99)
Potassium: 4.2 mmol/L (ref 3.5–5.1)
Sodium: 134 mmol/L — ABNORMAL LOW (ref 135–145)

## 2022-08-20 LAB — CBC
HCT: 23.9 % — ABNORMAL LOW (ref 36.0–46.0)
Hemoglobin: 8 g/dL — ABNORMAL LOW (ref 12.0–15.0)
MCH: 33.3 pg (ref 26.0–34.0)
MCHC: 33.5 g/dL (ref 30.0–36.0)
MCV: 99.6 fL (ref 80.0–100.0)
Platelets: 603 10*3/uL — ABNORMAL HIGH (ref 150–400)
RBC: 2.4 MIL/uL — ABNORMAL LOW (ref 3.87–5.11)
RDW: 12.9 % (ref 11.5–15.5)
WBC: 10.1 10*3/uL (ref 4.0–10.5)
nRBC: 0 % (ref 0.0–0.2)

## 2022-08-20 LAB — LACTATE DEHYDROGENASE: LDH: 165 U/L (ref 98–192)

## 2022-08-20 LAB — VITAMIN B12: Vitamin B-12: 874 pg/mL (ref 180–914)

## 2022-08-20 LAB — FERRITIN: Ferritin: 145 ng/mL (ref 11–307)

## 2022-08-20 MED ORDER — SODIUM CHLORIDE 0.9 % IV SOLN: 100.0000 mg | Freq: Once | INTRAVENOUS | Status: DC

## 2022-08-20 MED ORDER — ACETAMINOPHEN 325 MG PO TABS
650.0000 mg | ORAL_TABLET | Freq: Once | ORAL | Status: AC
  Administered 2022-08-20: 650 mg via ORAL

## 2022-08-20 MED ORDER — DIPHENHYDRAMINE HCL 25 MG PO CAPS
25.0000 mg | ORAL_CAPSULE | Freq: Once | ORAL | Status: AC
  Administered 2022-08-20: 25 mg via ORAL

## 2022-08-20 NOTE — Progress Notes (Signed)
[place] NOTE  Patient Care Team: Jon Billings, NP as PCP - General Kem Boroughs, FNP as Nurse Practitioner (Family Medicine) Christene Lye, MD (General Surgery) Emmaline Kluver., MD (Rheumatology) Yisroel Ramming, Everardo All, MD as Consulting Physician (Obstetrics and Gynecology) Cammie Sickle, MD as Consulting Physician (Hematology and Oncology)  CHIEF COMPLAINTS/PURPOSE OF CONSULTATION: Anemia  #chronic  Anemia/CKD-III-IDA  # #B12 deficiency  #  chronic thrombocytosis and monocytosis [since 2012].   myeloproliferative disorder such as CMML- April 2022-bone marrow biopsy-no acute process- likely reactive MPN- work up NEGATIVE.  # CKD-III [Dr.Murphy, nephrology]-lupus versus others- [Dr.Murphy]    Oncology History   No history exists.   HISTORY OF PRESENTING ILLNESS: Alone.  Ambulating independently.  [name] 66 y.o.  female with a history of chronic anemia/chronic mild monocytosis/thrombocytosis is here for follow-up.  Since her last visit with our office she has had a difficulty time. She was bitten by a dog and subsequently seen in the ER on 08/11/2022. She was found to be anemic with dropping hemoglobin levels while in the ER. She has a CT scan which showed that the bite did not involve any arteries but given dropping counts and anemia she was admitted for observation and discharged on 08/12/2022. She did not require any blood products in the ER. She was discharged home with Augmentin which she will finish in 2 days. She has since been seen by her PCP for wound care and monitoring of her anemia. She was recommended to have IV iron infusions as her hgb levels were not beginning to trend up as expected. She reports that she is tolerating the augment ok with some gi upset- tolerable. She denies pain of the thigh. She does have some mild weeping of blood from the area. She denies any other blood loss.   Review of Systems   Constitutional:  Positive for malaise/fatigue. Negative for chills, diaphoresis and fever.  HENT:  Negative for nosebleeds and sore throat.   Eyes:  Negative for double vision.  Respiratory:  Negative for cough, hemoptysis, sputum production, shortness of breath and wheezing.   Cardiovascular:  Negative for chest pain, palpitations, orthopnea and leg swelling.  Gastrointestinal:  Negative for abdominal pain, blood in stool, constipation, diarrhea, heartburn, melena, nausea and vomiting.  Genitourinary:  Negative for dysuria, frequency and urgency.  Musculoskeletal:  Positive for back pain and joint pain.  Skin: Negative.  Negative for itching and rash.  Neurological:  Negative for dizziness, tingling, focal weakness, weakness and headaches.  Endo/Heme/Allergies:  Does not bruise/bleed easily.  Psychiatric/Behavioral:  Negative for depression. The patient is not nervous/anxious and does not have insomnia.      MEDICAL HISTORY:  Past Medical History:  Diagnosis Date   Abnormal pap    Anemia    Anxiety    History of lupus 1987   Hypertension    Low vitamin D level    Renal insufficiency 1988   kidney biopsy 1988 and 1998- "my kidneys function at like 48%"    SURGICAL HISTORY: Past Surgical History:  Procedure Laterality Date   BONE MARROW BIOPSY  01/2021   BREAST BIOPSY Right 07/08/2017   BREAST EXCISIONAL BIOPSY Right 08/06/2017   BREAST EXCISIONAL BIOPSY Right 07/1999   BREAST LUMPECTOMY WITH RADIOACTIVE SEED LOCALIZATION Right 08/06/2017   Procedure: RIGHT BREAST LUMPECTOMY WITH RADIOACTIVE SEED LOCALIZATION ERAS PATHWAY;  Surgeon: Excell Seltzer, MD;  Location: [facility];  Service: General;  Laterality: Right;  BREAST LUMPECTOMY WITH RADIOACTIVE SEED LOCALIZATION Right 08/19/2018   Procedure: RIGHT BREAST LUMPECTOMY WITH RADIOACTIVE SEED LOCALIZATION ERAS PATHWAY;  Surgeon: Excell Seltzer, MD;  Location: [facility];  Service: General;  Laterality: Right;    BREAST SURGERY  07/1999   right breast benign fibroadenoma   COLONOSCOPY  2012   COLONOSCOPY WITH PROPOFOL N/A 05/01/2017   Procedure: COLONOSCOPY WITH PROPOFOL;  Surgeon: Christene Lye, MD;  Location: [facility] ENDOSCOPY;  Service: Endoscopy;  Laterality: N/A;   COLONOSCOPY WITH PROPOFOL N/A 12/18/2021   Procedure: COLONOSCOPY WITH PROPOFOL;  Surgeon: Lin Landsman, MD;  Location: [facility] ENDOSCOPY;  Service: Gastroenterology;  Laterality: N/A;   CRYOTHERAPY     abnormal pap smear   ESOPHAGOGASTRODUODENOSCOPY (EGD) WITH PROPOFOL N/A 05/01/2017   Procedure: ESOPHAGOGASTRODUODENOSCOPY (EGD) WITH PROPOFOL;  Surgeon: Christene Lye, MD;  Location: [facility] ENDOSCOPY;  Service: Endoscopy;  Laterality: N/A;   ESOPHAGOGASTRODUODENOSCOPY (EGD) WITH PROPOFOL N/A 12/18/2021   Procedure: ESOPHAGOGASTRODUODENOSCOPY (EGD) WITH PROPOFOL;  Surgeon: Lin Landsman, MD;  Location: [facility] ENDOSCOPY;  Service: Gastroenterology;  Laterality: N/A;   NASAL SEPTUM SURGERY  2007   TUBAL LIGATION  1990    SOCIAL HISTORY: Social History   Socioeconomic History   Marital status: Widowed    Spouse name: Not on file   Number of children: Not on file   Years of education: Not on file   Highest education level: Not on file  Occupational History   Not on file  Tobacco Use   Smoking status: Never   Smokeless tobacco: Never  Vaping Use   Vaping Use: Never used  Substance and Sexual Activity   Alcohol use: Yes    Alcohol/week: 4.0 - 6.0 standard drinks of alcohol    Types: 4 - 6 Standard drinks or equivalent per week    Comment: few beers on friday and saturday   Drug use: Yes    Types: Marijuana    Comment: few times a month    Sexual activity: Yes    Partners: Male    Birth control/protection: Surgical, Post-menopausal    Comment: BTL  Other Topics Concern   Not on file  Social History Narrative   Husband died December 16, 2016 from lung cancer at age 34 yrs old.     Social Determinants of  Health   Financial Resource Strain: Low Risk  (11/06/2021)   Overall Financial Resource Strain (CARDIA)    Difficulty of Paying Living Expenses: Not very hard  Food Insecurity: No Food Insecurity (08/12/2022)   Hunger Vital Sign    Worried About Running Out of Food in the Last Year: Never true    Ran Out of Food in the Last Year: Never true  Transportation Needs: No Transportation Needs (08/12/2022)   PRAPARE - Hydrologist (Medical): No    Lack of Transportation (Non-Medical): No  Physical Activity: Not on file  Stress: No Stress Concern Present (11/06/2021)   [place]    Feeling of Stress : Only a little  Social Connections: Moderately Integrated (11/06/2021)   Social Connection and Isolation Panel [NHANES]    Frequency of Communication with Friends and Family: Three times a week    Frequency of Social Gatherings with Friends and Family: More than three times a week    Attends Religious Services: More than 4 times per year    Active Member of [employer] or Organizations: Yes    Attends Archivist Meetings: 1 to  4 times per year    Marital Status: Widowed  Intimate Partner Violence: Not At Risk (08/12/2022)   Humiliation, Afraid, Rape, and Kick questionnaire    Fear of Current or Ex-Partner: No    Emotionally Abused: No    Physically Abused: No    Sexually Abused: No    FAMILY HISTORY: Family History  Problem Relation Age of Onset   Cancer Father        oral cancer   Cancer Maternal Grandmother        ovarian cancer   Hypertension Maternal Grandmother    Diabetes Maternal Grandfather    Diabetes Paternal Aunt    Breast cancer Neg Hx     ALLERGIES:  is allergic to elemental sulfur, sulfa antibiotics, beef-derived products, lambs quarters, and milk-related compounds.  MEDICATIONS:  Current Outpatient Medications  Medication Sig Dispense Refill   amoxicillin-clavulanate  (AUGMENTIN) 875-125 MG tablet Take 1 tablet by mouth 2 (two) times daily for 19 doses. Start in evening 08/12/22 19 tablet 0   chlorthalidone (HYGROTON) 25 MG tablet Take 12.5 mg by mouth in the morning.     Cholecalciferol (VITAMIN D3 PO) Take 1 tablet by mouth daily.      cyanocobalamin 1000 MCG tablet Take 1,000 mcg by mouth daily. Vitamin B12     enalapril (VASOTEC) 20 MG tablet Take 0.5 tablets (10 mg total) by mouth 2 (two) times daily.     Ferrous Sulfate (IRON) 325 (65 Fe) MG TABS Take 1 tablet by mouth daily.     fluticasone (FLONASE) 50 MCG/ACT nasal spray Place 2 sprays into both nostrils daily. 16 g 6   hydroxychloroquine (PLAQUENIL) 200 MG tablet Take 200 mg by mouth every other day.      lipase/protease/amylase (CREON) 36000 UNITS CPEP capsule Take 2 capsules with the first bite of each meal and 1 capsule with the first bite of each snack 240 capsule 2   meclizine (ANTIVERT) 12.5 MG tablet Take 1 tablet (12.5 mg total) by mouth 3 (three) times daily as needed for dizziness. 30 tablet 0   oxyCODONE (OXY IR/ROXICODONE) 5 MG immediate release tablet Take 1 tablet (5 mg total) by mouth every 6 (six) hours as needed for moderate pain. 20 tablet 0   traZODone (DESYREL) 50 MG tablet TAKE 1 TABLET BY MOUTH EVERY DAY AT BEDTIME AS NEEDED FOR SLEEP 90 tablet 0   No current facility-administered medications for this visit.      Marland Kitchen  PHYSICAL EXAMINATION: ECOG PERFORMANCE STATUS: 1 - Symptomatic but completely ambulatory  Vitals:   08/20/22 1442  BP: (!) 128/58  Pulse: 88  Resp: 17  Temp: 98.3 F (36.8 C)  SpO2: 100%   Filed Weights   08/20/22 1442  Weight: 187 lb (84.8 kg)    Physical Exam Vitals and nursing note reviewed.  Constitutional:      Comments:     HENT:     Head: Normocephalic and atraumatic.     Mouth/Throat:     Mouth: Mucous membranes are moist.     Pharynx: No oropharyngeal exudate.  Eyes:     Pupils: Pupils are equal, round, and reactive to light.   Cardiovascular:     Rate and Rhythm: Normal rate and regular rhythm.  Pulmonary:     Effort: No respiratory distress.     Breath sounds: No wheezing.  Abdominal:     General: Bowel sounds are normal. There is no distension.     Palpations: Abdomen is soft. There is  no mass.     Tenderness: There is no abdominal tenderness. There is no guarding or rebound.  Musculoskeletal:        General: No tenderness. Normal range of motion.     Cervical back: Normal range of motion and neck supple.  Skin:    General: Skin is warm.  Neurological:     Mental Status: She is alert and oriented to person, place, and time.  Psychiatric:        Mood and Affect: Affect normal.        Judgment: Judgment normal.      LABORATORY DATA:  I have reviewed the data as listed Lab Results  Component Value Date   WBC 10.1 08/20/2022   HGB 8.0 (L) 08/20/2022   HCT 23.9 (L) 08/20/2022   MCV 99.6 08/20/2022   PLT 603 (H) 08/20/2022   Recent Labs    08/11/22 1646 08/12/22 0625 08/15/22 1608 08/20/22 1418  NA 134* 138 137 134*  K 4.2 4.2 4.5 4.2  CL 106 108 100 105  CO2 19* _0 GLUCOSE 118* 105* 113* 130*  BUN 30* 26* 27 21  CREATININE 1.47* 1.56* 1.57* 1.44*  CALCIUM 8.7* 8.6* 9.0 8.7*  GFRNONAA 39* 36*  --  40*    RADIOGRAPHIC STUDIES: I have personally reviewed the radiological images as listed and agreed with the findings in the report. CT ANGIO AO+BIFEM W & OR WO CONTRAST  Result Date: 08/11/2022 CLINICAL DATA:  Dog bite.  Left lower extremity wound. EXAM: CT ANGIOGRAPHY OF ABDOMINAL AORTA WITH ILIOFEMORAL RUNOFF TECHNIQUE: Multidetector CT imaging of the abdomen, pelvis and lower extremities was performed using the standard protocol during bolus administration of intravenous contrast. Multiplanar CT image reconstructions and MIPs were obtained to evaluate the vascular anatomy. RADIATION DOSE REDUCTION: This exam was performed according to the departmental dose-optimization program  which includes automated exposure control, adjustment of the mA and/or kV according to patient size and/or use of iterative reconstruction technique. CONTRAST:  163m OMNIPAQUE IOHEXOL 350 MG/ML SOLN COMPARISON:  None Available. FINDINGS: VASCULAR Aorta: Normal caliber aorta without aneurysm, dissection, vasculitis or significant stenosis. RIGHT Lower Extremity Inflow: Common, internal and external iliac arteries are patent without evidence of aneurysm, dissection, vasculitis or significant stenosis. Outflow: Common, superficial and profunda femoral arteries and the popliteal artery are patent without evidence of aneurysm, dissection, vasculitis or significant stenosis. Runoff: Patent three vessel runoff to the ankle. LEFT Lower Extremity Inflow: Common, internal and external iliac arteries are patent without evidence of aneurysm, dissection, vasculitis or significant stenosis. Outflow: Common, superficial and profunda femoral arteries and the popliteal artery are patent without evidence of aneurysm, dissection, vasculitis or significant stenosis. Runoff: Patent three vessel runoff to the ankle. Veins: No obvious venous abnormality within the limitations of this arterial phase study. Review of the MIP images confirms the above findings. NON-VASCULAR Adrenals/Urinary Tract: The distal ureters and urinary bladder are within normal limits. Stomach/Bowel: Diverticular changes are present in the distal descending and sigmoid colon without focal inflammation to suggest diverticulitis. The appendix is visualized and normal. Visualized small bowel is otherwise unremarkable. Lymphatic: No significant adenopathy is present. Reproductive: Uterus and bilateral adnexa are unremarkable. Other: No abdominal wall hernia or abnormality. No abdominopelvic ascites. Musculoskeletal: Multiple puncture wounds are present in the anterior left thigh. Subcutaneous hematoma is present. No foreign body is present. There is no penetration of  the deep fascia. Soft tissue wound is noted along the anteromedial aspect of the right knee. No  penetration of the deep fascia. No foreign body. IMPRESSION: 1. Multiple puncture wounds in the anterior left thigh without penetration of the deep fascia. 2. Soft tissue wound along the anteromedial aspect of the right knee without penetration of the deep fascia. 3. No foreign body. 4. No acute or focal arterial injury. 5. Colonic diverticulosis without diverticulitis. Electronically Signed   By: San Morelle M.D.   On: 08/11/2022 18:08   DG Tibia/Fibula Right  Result Date: 08/11/2022 CLINICAL DATA:  Dog bite to right lower leg EXAM: RIGHT TIBIA AND FIBULA - 2 VIEW COMPARISON:  None Available. FINDINGS: There is no evidence of fracture or other focal bone lesions. Subcutaneous emphysema at the medial knee. Apparent soft tissue defect anterior to the proximal tibia. No radiopaque foreign body. Subcutaneous soft tissue radiodensities along the anterior distal tibia and medial malleolus likely reflects soft tissue granulomata. Plantar calcaneal spur. IMPRESSION: 1. No acute osseous abnormality.  No radiopaque foreign body. 2. Subcutaneous emphysema at the medial knee and apparent soft tissue defect anterior to the proximal tibia. Electronically Signed   By: Darrin Nipper M.D.   On: 08/11/2022 16:33   DG Femur Min 2 Views Left  Result Date: 08/11/2022 CLINICAL DATA:  Dog bite.  Left leg pain. EXAM: LEFT FEMUR 2 VIEWS COMPARISON:  None Available. FINDINGS: Minimal degenerative changes are present at the knee. No acute bone or soft tissue abnormalities are present. Mild diffuse edema is present within the soft tissues. IMPRESSION: 1. Mild diffuse edema within the soft tissues. 2. No acute osseous abnormality. Electronically Signed   By: San Morelle M.D.   On: 08/11/2022 16:30    ASSESSMENT & PLAN:  Encounter Diagnoses  Name Primary?   Symptomatic anemia Yes   Anemia due to vitamin B12 deficiency,  unspecified B12 deficiency type    Iron deficiency anemia, unspecified iron deficiency anemia type    Thrombocytosis    Monocytosis    Dog bite, subsequent encounter    Symptomatic Anemia: Acute on chronic. She wishes to start IV iron. We discussed risk/benefits. Given that oral iron sometimes gives her a headache I will have this be administered with benadryl and tylenol. Ferritin and iron pending at time of visit however given her anemia and recent dog bite we elected to start one dose and will reschedule further irons as needed once labs have resulted.   Anemia due to B12 deficiency: Was getting B12 every 3 months however she expresses that she does not feel comfortable getting the B12 without knowing her levels first. We will hold off on this today until her B12 has results.   IDA: Chronic. See above.   Thrombocytosis- Acute on chronic. Likely secondary to IDA and recent injury. Will monitor  Monocytosis- Chronic. Will continue to monitor.   Dog Bite: New to me. Has script of doxycyline to start after her Augmentin by another provider. Scant slow weeping but no sign of significant active bleed. Continue close follow up and monitoring with PCP.  Today: IV Venofer with 25 mg benadryl and 650 mg tylenol  Holding B12 injection and future Venofer appointments until labs have resulted Per Patient she has follow up with PCP tomorrow for her anemia and wound RTC 1 month MD, labs (CBC w, iron/TIBC, ferritin, BMP) +- venofer -[place]   UPDATE: Prior to patient receiving her IV iron her ferritin level resulted- Normal. No IV iron needed at this time.  All questions were answered. The patient knows to call the clinic with any problems,  questions or concerns.    Hughie Closs, PA-C 08/20/2022 3:38 PM

## 2022-08-20 NOTE — Progress Notes (Signed)
Patient here for oncology follow-up appointment,  concerns of falls and recent dog bite

## 2022-08-20 NOTE — Progress Notes (Signed)
Bilateral dressing change to thighs. Dog bite. Cleaned with saline. Wrapped with gaze and bandage. Clean dry intact

## 2022-08-20 NOTE — Progress Notes (Signed)
Per Nelwyn Salisbury, PA, ferritin level is normal today.  Pt does not need venofer.  Pt verbalized understanding.

## 2022-08-20 NOTE — Progress Notes (Signed)
LMP 08/22/2008    Subjective:    Patient ID: [name], female    DOB: [birth year], 66 y.o.   MRN: [id number]  HPI: [name] is a 66 y.o. female presenting on 08/21/2022 for comprehensive medical examination. Current medical complaints include:{Blank single:19197::"none","***"}  She currently lives with: Menopausal Symptoms: {Blank single:19197::"yes","no"}  Depression Screen done today and results listed below:     08/15/2022    3:27 PM 07/31/2022    3:39 PM 03/01/2022    1:59 PM 01/18/2022    2:56 PM 11/06/2021    8:38 AM  Depression screen PHQ 2/9  Decreased Interest _0 0  Down, Depressed, Hopeless _1 PHQ - 2 Score _2 Altered sleeping _3 Tired, decreased energy _4 Change in appetite _5 Feeling bad or failure about yourself  _6 0   Trouble concentrating 1 0 0 1   Moving slowly or fidgety/restless 1 0 1 1   Suicidal thoughts 0  0 0   PHQ-9 Score _7 Difficult doing work/chores Somewhat difficult Somewhat difficult Not difficult at all Somewhat difficult     The patient {has/does not have:19849} a history of falls. I {did/did not:19850} complete a risk assessment for falls. A plan of care for falls {was/was not:19852} documented.   Past Medical History:  Past Medical History:  Diagnosis Date   Abnormal pap    Anemia    Anxiety    History of lupus 1987   Hypertension    Low vitamin D level    Renal insufficiency 1988   kidney biopsy 1988 and 1998- "my kidneys function at like 48%"    Surgical History:  Past Surgical History:  Procedure Laterality Date   BONE MARROW BIOPSY  01/2021   BREAST BIOPSY Right 07/08/2017   BREAST EXCISIONAL BIOPSY Right 08/06/2017   BREAST EXCISIONAL BIOPSY Right 07/1999   BREAST LUMPECTOMY WITH RADIOACTIVE SEED LOCALIZATION Right 08/06/2017   Procedure: RIGHT BREAST LUMPECTOMY WITH RADIOACTIVE SEED LOCALIZATION ERAS PATHWAY;  Surgeon: Excell Seltzer, MD;   Location: [facility];  Service: General;  Laterality: Right;   BREAST LUMPECTOMY WITH RADIOACTIVE SEED LOCALIZATION Right 08/19/2018   Procedure: RIGHT BREAST LUMPECTOMY [place];  Surgeon: Excell Seltzer, MD;  Location: [facility];  Service: General;  Laterality: Right;   BREAST SURGERY  07/1999   right breast benign fibroadenoma   COLONOSCOPY  2012   COLONOSCOPY WITH PROPOFOL N/A 05/01/2017   Procedure: COLONOSCOPY WITH PROPOFOL;  Surgeon: Christene Lye, MD;  Location: [facility] ENDOSCOPY;  Service: Endoscopy;  Laterality: N/A;   COLONOSCOPY WITH PROPOFOL N/A 12/18/2021   Procedure: COLONOSCOPY WITH PROPOFOL;  Surgeon: Lin Landsman, MD;  Location: [facility] ENDOSCOPY;  Service: Gastroenterology;  Laterality: N/A;   CRYOTHERAPY     abnormal pap smear   ESOPHAGOGASTRODUODENOSCOPY (EGD) WITH PROPOFOL N/A 05/01/2017   Procedure: ESOPHAGOGASTRODUODENOSCOPY (EGD) WITH PROPOFOL;  Surgeon: Christene Lye, MD;  Location: [facility] ENDOSCOPY;  Service: Endoscopy;  Laterality: N/A;   ESOPHAGOGASTRODUODENOSCOPY (EGD) WITH PROPOFOL N/A 12/18/2021   Procedure: ESOPHAGOGASTRODUODENOSCOPY (EGD) WITH PROPOFOL;  Surgeon: Lin Landsman, MD;  Location: [facility] ENDOSCOPY;  Service: Gastroenterology;  Laterality: N/A;   NASAL SEPTUM SURGERY  2007   TUBAL LIGATION  1990    Medications:  Current Outpatient Medications on File Prior to  Visit  Medication Sig   amoxicillin-clavulanate (AUGMENTIN) 875-125 MG tablet Take 1 tablet by mouth 2 (two) times daily for 19 doses. Start in evening 08/12/22   chlorthalidone (HYGROTON) 25 MG tablet Take 12.5 mg by mouth in the morning.   Cholecalciferol (VITAMIN D3 PO) Take 1 tablet by mouth daily.    cyanocobalamin 1000 MCG tablet Take 1,000 mcg by mouth daily. Vitamin B12   enalapril (VASOTEC) 20 MG tablet Take 0.5 tablets (10 mg total) by mouth 2 (two) times daily.   Ferrous Sulfate (IRON) 325 (65 Fe) MG TABS  Take 1 tablet by mouth daily.   fluticasone (FLONASE) 50 MCG/ACT nasal spray Place 2 sprays into both nostrils daily.   hydroxychloroquine (PLAQUENIL) 200 MG tablet Take 200 mg by mouth every other day.    lipase/protease/amylase (CREON) 36000 UNITS CPEP capsule Take 2 capsules with the first bite of each meal and 1 capsule with the first bite of each snack   meclizine (ANTIVERT) 12.5 MG tablet Take 1 tablet (12.5 mg total) by mouth 3 (three) times daily as needed for dizziness.   oxyCODONE (OXY IR/ROXICODONE) 5 MG immediate release tablet Take 1 tablet (5 mg total) by mouth every 6 (six) hours as needed for moderate pain.   traZODone (DESYREL) 50 MG tablet TAKE 1 TABLET BY MOUTH EVERY DAY AT BEDTIME AS NEEDED FOR SLEEP   No current facility-administered medications on file prior to visit.    Allergies:  Allergies  Allergen Reactions   Elemental Sulfur Hives   Sulfa Antibiotics Hives, Itching and Rash   Beef-Derived Products     Stomach issues   L[employer] meat-Stomach issues    Milk-Related Compounds     Stomach issues    Social History:  Social History   Socioeconomic History   Marital status: Widowed    Spouse name: Not on file   Number of children: Not on file   Years of education: Not on file   Highest education level: Not on file  Occupational History   Not on file  Tobacco Use   Smoking status: Never   Smokeless tobacco: Never  Vaping Use   Vaping Use: Never used  Substance and Sexual Activity   Alcohol use: Yes    Alcohol/week: 4.0 - 6.0 standard drinks of alcohol    Types: 4 - 6 Standard drinks or equivalent per week    Comment: few beers on friday and saturday   Drug use: Yes    Types: Marijuana    Comment: few times a month    Sexual activity: Yes    Partners: Male    Birth control/protection: Surgical, Post-menopausal    Comment: BTL  Other Topics Concern   Not on file  Social History Narrative   Husband died 11-28-16 from lung cancer  at age 67 yrs old.     Social Determinants of Health   Financial Resource Strain: Low Risk  (11/06/2021)   Overall Financial Resource Strain (CARDIA)    Difficulty of Paying Living Expenses: Not very hard  Food Insecurity: No Food Insecurity (08/12/2022)   Hunger Vital Sign    Worried About Running Out of Food in the Last Year: Never true    Ran Out of Food in the Last Year: Never true  Transportation Needs: No Transportation Needs (08/12/2022)   PRAPARE - Hydrologist (Medical): No    Lack of Transportation (Non-Medical): No  Physical Activity: Not on file  Stress: No Stress Concern Present (11/06/2021)   [place]    Feeling of Stress : Only a little  Social Connections: Moderately Integrated (11/06/2021)   Social Connection and Isolation Panel [NHANES]    Frequency of Communication with Friends and Family: Three times a week    Frequency of Social Gatherings with Friends and Family: More than three times a week    Attends Religious Services: More than 4 times per year    Active Member of Clubs or Organizations: Yes    Attends Archivist Meetings: 1 to 4 times per year    Marital Status: Widowed  Intimate Partner Violence: Not At Risk (08/12/2022)   Humiliation, Afraid, Rape, and Kick questionnaire    Fear of Current or Ex-Partner: No    Emotionally Abused: No    Physically Abused: No    Sexually Abused: No   Social History   Tobacco Use  Smoking Status Never  Smokeless Tobacco Never   Social History   Substance and Sexual Activity  Alcohol Use Yes   Alcohol/week: 4.0 - 6.0 standard drinks of alcohol   Types: 4 - 6 Standard drinks or equivalent per week   Comment: few beers on friday and saturday    Family History:  Family History  Problem Relation Age of Onset   Cancer Father        oral cancer   Cancer Maternal Grandmother        ovarian cancer    Hypertension Maternal Grandmother    Diabetes Maternal Grandfather    Diabetes Paternal Aunt    Breast cancer Neg Hx     Past medical history, surgical history, medications, allergies, family history and social history reviewed with patient today and changes made to appropriate areas of the chart.   ROS All other ROS negative except what is listed above and in the HPI.      Objective:    LMP 08/22/2008   Wt Readings from Last 3 Encounters:  08/20/22 187 lb (84.8 kg)  08/15/22 189 lb 6.4 oz (85.9 kg)  08/12/22 186 lb 4.6 oz (84.5 kg)    Physical Exam  Results for orders placed or performed in visit on 24/58/09  Basic metabolic panel  Result Value Ref Range   Sodium 134 (L) 135 - 145 mmol/L   Potassium 4.2 3.5 - 5.1 mmol/L   Chloride 105 98 - 111 mmol/L   CO2 24 22 - 32 mmol/L   Glucose, Bld 130 (H) 70 - 99 mg/dL   BUN 21 8 - 23 mg/dL   Creatinine, Ser 1.44 (H) 0.44 - 1.00 mg/dL   Calcium 8.7 (L) 8.9 - 10.3 mg/dL   GFR, Estimated 40 (L) >60 mL/min   Anion gap 5 5 - 15  CBC  Result Value Ref Range   WBC 10.1 4.0 - 10.5 K/uL   RBC 2.40 (L) 3.87 - 5.11 MIL/uL   Hemoglobin 8.0 (L) 12.0 - 15.0 g/dL   HCT 23.9 (L) 36.0 - 46.0 %   MCV 99.6 80.0 - 100.0 fL   MCH 33.3 26.0 - 34.0 pg   MCHC 33.5 30.0 - 36.0 g/dL   RDW 12.9 11.5 - 15.5 %   Platelets 603 (H) 150 - 400 K/uL   nRBC 0.0 0.0 - 0.2 %  Lactate dehydrogenase  Result Value Ref Range   LDH 165 98 - 192 U/L      Assessment & Plan:   Problem List  Items Addressed This Visit   None    Follow up plan: No follow-ups on file.   LABORATORY TESTING:  - Pap smear: {Blank MSXJDB:52080::"EMV done","not applicable","up to date","done elsewhere"}  IMMUNIZATIONS:   - Tdap: Tetanus vaccination status reviewed: {tetanus status:315746}. - Influenza: {Blank single:19197::"Up to date","Administered today","Postponed to flu season","Refused","Given elsewhere"} - Pneumovax: {Blank single:19197::"Up to date","Administered  today","Not applicable","Refused","Given elsewhere"} - Prevnar: {Blank single:19197::"Up to date","Administered today","Not applicable","Refused","Given elsewhere"} - COVID: {Blank single:19197::"Up to date","Administered today","Not applicable","Refused","Given elsewhere"} - HPV: {Blank single:19197::"Up to date","Administered today","Not applicable","Refused","Given elsewhere"} - Shingrix vaccine: {Blank single:19197::"Up to date","Administered today","Not applicable","Refused","Given elsewhere"}  SCREENING: -Mammogram: {Blank single:19197::"Up to date","Ordered today","Not applicable","Refused","Done elsewhere"}  - Colonoscopy: {Blank single:19197::"Up to date","Ordered today","Not applicable","Refused","Done elsewhere"}  - Bone Density: {Blank single:19197::"Up to date","Ordered today","Not applicable","Refused","Done elsewhere"}  -Hearing Test: {Blank single:19197::"Up to date","Ordered today","Not applicable","Refused","Done elsewhere"}  -Spirometry: {Blank single:19197::"Up to date","Ordered today","Not applicable","Refused","Done elsewhere"}   PATIENT COUNSELING:   Advised to take 1 mg of folate supplement per day if capable of pregnancy.   Sexuality: Discussed sexually transmitted diseases, partner selection, use of condoms, avoidance of unintended pregnancy  and contraceptive alternatives.   Advised to avoid cigarette smoking.  I discussed with the patient that most people either abstain from alcohol or drink within safe limits (<=14/week and <=4 drinks/occasion for males, <=7/weeks and <= 3 drinks/occasion for females) and that the risk for alcohol disorders and other health effects rises proportionally with the number of drinks per week and how often a drinker exceeds daily limits.  Discussed cessation/primary prevention of drug use and availability of treatment for abuse.   Diet: Encouraged to adjust caloric intake to maintain  or achieve ideal body weight, to reduce intake of  dietary saturated fat and total fat, to limit sodium intake by avoiding high sodium foods and not adding table salt, and to maintain adequate dietary potassium and calcium preferably from fresh fruits, vegetables, and low-fat dairy products.    stressed the importance of regular exercise  Injury prevention: Discussed safety belts, safety helmets, smoke detector, smoking near bedding or upholstery.   Dental health: Discussed importance of regular tooth brushing, flossing, and dental visits.    NEXT PREVENTATIVE PHYSICAL DUE IN 1 YEAR. No follow-ups on file.

## 2022-08-21 ENCOUNTER — Encounter: Payer: Self-pay | Admitting: Nurse Practitioner

## 2022-08-21 ENCOUNTER — Ambulatory Visit (INDEPENDENT_AMBULATORY_CARE_PROVIDER_SITE_OTHER): Payer: Medicare Other | Admitting: Nurse Practitioner

## 2022-08-21 VITALS — BP 116/60 | HR 72 | Temp 98.2°F | Ht 66.24 in | Wt 188.6 lb

## 2022-08-21 DIAGNOSIS — Z0000 Encounter for general adult medical examination without abnormal findings: Secondary | ICD-10-CM | POA: Diagnosis not present

## 2022-08-21 DIAGNOSIS — F419 Anxiety disorder, unspecified: Secondary | ICD-10-CM

## 2022-08-21 DIAGNOSIS — W540XXA Bitten by dog, initial encounter: Secondary | ICD-10-CM | POA: Diagnosis not present

## 2022-08-21 DIAGNOSIS — D519 Vitamin B12 deficiency anemia, unspecified: Secondary | ICD-10-CM | POA: Diagnosis not present

## 2022-08-21 DIAGNOSIS — N1831 Chronic kidney disease, stage 3a: Secondary | ICD-10-CM | POA: Diagnosis not present

## 2022-08-21 DIAGNOSIS — M3214 Glomerular disease in systemic lupus erythematosus: Secondary | ICD-10-CM | POA: Diagnosis not present

## 2022-08-21 DIAGNOSIS — S81859A Open bite, unspecified lower leg, initial encounter: Secondary | ICD-10-CM

## 2022-08-21 DIAGNOSIS — E559 Vitamin D deficiency, unspecified: Secondary | ICD-10-CM

## 2022-08-21 DIAGNOSIS — I10 Essential (primary) hypertension: Secondary | ICD-10-CM | POA: Diagnosis not present

## 2022-08-21 LAB — URINALYSIS, ROUTINE W REFLEX MICROSCOPIC
Bilirubin, UA: NEGATIVE
Glucose, UA: NEGATIVE
Ketones, UA: NEGATIVE
Leukocytes,UA: NEGATIVE
Nitrite, UA: NEGATIVE
Protein,UA: NEGATIVE
RBC, UA: NEGATIVE
Specific Gravity, UA: 1.01 (ref 1.005–1.030)
Urobilinogen, Ur: 0.2 mg/dL (ref 0.2–1.0)
pH, UA: 6 (ref 5.0–7.5)

## 2022-08-21 MED ORDER — CYANOCOBALAMIN 1000 MCG/ML IJ SOLN
1000.0000 ug | Freq: Once | INTRAMUSCULAR | Status: AC
  Administered 2022-08-21: 1000 ug via INTRAMUSCULAR

## 2022-08-21 NOTE — Assessment & Plan Note (Signed)
Chronic.  Controlled.  Continue with current medication regimen of enalapril and Chlorthalidone.  Medications are prescribed by [facility].  Labs ordered today.  Return to clinic in 6 months for reevaluation.  Call sooner if concerns arise.

## 2022-08-21 NOTE — Assessment & Plan Note (Signed)
Chronic. Labs reviewed from Hematology.  Will give B12 injection in office today.  Will follow up in 2 weeks for repeat labs.  Continue with otc B12 supplement as well.

## 2022-08-21 NOTE — Assessment & Plan Note (Signed)
Ongoing.  Still oozing and stiches do not appear to be ready to be taken out.  Edges are still coming together.  Wound is red and hot around the area.  Encouraged patient to complete amoxicillin then start the Doxycycline she has.  Follow up in 2 days for reevaluation and possible stitch removal.

## 2022-08-21 NOTE — Assessment & Plan Note (Addendum)
Chronic, ongoing.  Followed by [facility] nephrology, continue this collaboration.  Recent note reviewed and medication change.  Check CMP today.

## 2022-08-21 NOTE — Assessment & Plan Note (Signed)
Chronic, ongoing.  Followed by rheumatology and hematology.  Recent labs on discharge showed lower H/H -- will plan recheck today and if HGB remains low will plan to reach out to hematology to see if needs to be seen sooner then December.  Currently no symptoms of anemia, other then fatigue.  Is on Hydroxychloroquine.  Up to date on eye exams.  Continue to follow up with Rheumatology.

## 2022-08-21 NOTE — Assessment & Plan Note (Signed)
Chronic, ongoing.  Followed by [facility] nephrology.  Recent note reviewed.  Recheck CMP today.

## 2022-08-22 LAB — LIPID PANEL
Chol/HDL Ratio: 3.1 ratio (ref 0.0–4.4)
Cholesterol, Total: 162 mg/dL (ref 100–199)
HDL: 52 mg/dL (ref >39)
LDL Chol Calc (NIH): 98 mg/dL (ref 0–99)
Triglycerides: 62 mg/dL (ref 0–149)
VLDL Cholesterol Cal: 12 mg/dL (ref 5–40)

## 2022-08-22 LAB — COMPREHENSIVE METABOLIC PANEL
ALT: 7 IU/L (ref 0–32)
AST: 14 IU/L (ref 0–40)
Albumin/Globulin Ratio: 1.6 (ref 1.2–2.2)
Albumin: 4.2 g/dL (ref 3.9–4.9)
Alkaline Phosphatase: 85 IU/L (ref 44–121)
BUN/Creatinine Ratio: 12 (ref 12–28)
BUN: 19 mg/dL (ref 8–27)
Bilirubin Total: 0.4 mg/dL (ref 0.0–1.2)
CO2: 22 mmol/L (ref 20–29)
Calcium: 9.4 mg/dL (ref 8.7–10.3)
Chloride: 100 mmol/L (ref 96–106)
Creatinine, Ser: 1.55 mg/dL — ABNORMAL HIGH (ref 0.57–1.00)
Globulin, Total: 2.6 g/dL (ref 1.5–4.5)
Glucose: 86 mg/dL (ref 70–99)
Potassium: 4.9 mmol/L (ref 3.5–5.2)
Sodium: 135 mmol/L (ref 134–144)
Total Protein: 6.8 g/dL (ref 6.0–8.5)
eGFR: 36 mL/min/{1.73_m2} — ABNORMAL LOW (ref >59)

## 2022-08-22 LAB — TSH: TSH: 4.38 u[IU]/mL (ref 0.450–4.500)

## 2022-08-22 NOTE — Progress Notes (Signed)
Please let patient know that her lab work shows that her kidney function is stable.  Cholesterol and thyroid labs also look good.  Follow up as discussed.

## 2022-08-22 NOTE — Progress Notes (Unsigned)
   LMP 08/22/2008    Subjective:    Patient ID: [name], female    DOB: 09-02-1956, 66 y.o.   MRN: [id number]  HPI: [name] is a 66 y.o. female  No chief complaint on file.  DOG BITE Patient is here to follow up on dog bite.  Was initially seen on October 22 and stiches were placed.  She has been taking amoxicillin and has 3 pills let.  Also has a prescription of doxycyline she was given for a different wound but told to stop taking when she had the dog bite.     Relevant past medical, surgical, family and social history reviewed and updated as indicated. Interim medical history since our last visit reviewed. Allergies and medications reviewed and updated.  Review of Systems  Per HPI unless specifically indicated above     Objective:    LMP 08/22/2008   Wt Readings from Last 3 Encounters:  08/21/22 188 lb 9.6 oz (85.5 kg)  08/20/22 187 lb (84.8 kg)  08/15/22 189 lb 6.4 oz (85.9 kg)    Physical Exam  Results for orders placed or performed in visit on 08/21/22  Comprehensive metabolic panel  Result Value Ref Range   Glucose 86 70 - 99 mg/dL   BUN 19 8 - 27 mg/dL   Creatinine, Ser 1.55 (H) 0.57 - 1.00 mg/dL   eGFR 36 (L) >59 mL/min/1.73   BUN/Creatinine Ratio 12 12 - 28   Sodium 135 134 - 144 mmol/L   Potassium 4.9 3.5 - 5.2 mmol/L   Chloride 100 96 - 106 mmol/L   CO2 22 20 - 29 mmol/L   Calcium 9.4 8.7 - 10.3 mg/dL   Total Protein 6.8 6.0 - 8.5 g/dL   Albumin 4.2 3.9 - 4.9 g/dL   Globulin, Total 2.6 1.5 - 4.5 g/dL   Albumin/Globulin Ratio 1.6 1.2 - 2.2   Bilirubin Total 0.4 0.0 - 1.2 mg/dL   Alkaline Phosphatase 85 44 - 121 IU/L   AST 14 0 - 40 IU/L   ALT 7 0 - 32 IU/L  Lipid panel  Result Value Ref Range   Cholesterol, Total 162 100 - 199 mg/dL   Triglycerides 62 0 - 149 mg/dL   HDL 52 >39 mg/dL   VLDL Cholesterol Cal 12 5 - 40 mg/dL   LDL Chol Calc (NIH) 98 0 - 99 mg/dL   Chol/HDL Ratio 3.1 0.0 - 4.4 ratio  TSH  Result Value Ref  Range   TSH 4.380 0.450 - 4.500 uIU/mL  Urinalysis, Routine w reflex microscopic  Result Value Ref Range   Specific Gravity, UA 1.010 1.005 - 1.030   pH, UA 6.0 5.0 - 7.5   Color, UA Yellow Yellow   Appearance Ur Clear Clear   Leukocytes,UA Negative Negative   Protein,UA Negative Negative/Trace   Glucose, UA Negative Negative   Ketones, UA Negative Negative   RBC, UA Negative Negative   Bilirubin, UA Negative Negative   Urobilinogen, Ur 0.2 0.2 - 1.0 mg/dL   Nitrite, UA Negative Negative   Microscopic Examination Comment       Assessment & Plan:   Problem List Items Addressed This Visit   None    Follow up plan: No follow-ups on file.

## 2022-08-23 ENCOUNTER — Encounter: Payer: Self-pay | Admitting: Nurse Practitioner

## 2022-08-23 ENCOUNTER — Ambulatory Visit (INDEPENDENT_AMBULATORY_CARE_PROVIDER_SITE_OTHER): Payer: Medicare Other | Admitting: Nurse Practitioner

## 2022-08-23 VITALS — BP 109/59 | HR 84 | Temp 97.7°F | Wt 188.1 lb

## 2022-08-23 DIAGNOSIS — S81859A Open bite, unspecified lower leg, initial encounter: Secondary | ICD-10-CM

## 2022-08-23 DIAGNOSIS — F419 Anxiety disorder, unspecified: Secondary | ICD-10-CM | POA: Diagnosis not present

## 2022-08-23 DIAGNOSIS — W540XXA Bitten by dog, initial encounter: Secondary | ICD-10-CM | POA: Diagnosis not present

## 2022-08-23 MED ORDER — CLONAZEPAM 0.5 MG PO TABS: 0.5000 mg | ORAL_TABLET | Freq: Every day | ORAL | 0 refills | Status: DC | PRN

## 2022-08-23 NOTE — Assessment & Plan Note (Signed)
Ongoing. Redness and warmth around the wound bed has improved.  Two stiches removed from lower leg wound on the lateral side. Other stitches in both wound beds are not approximated.  Urgent referral placed for wound clinic.  Made patient an appointment for tomorrow morning.  Keep follow up in 2 weeks.

## 2022-08-23 NOTE — Assessment & Plan Note (Signed)
Chronic. Will give Klonopin #20. Discussed side effects and benefits of medication discussed during visit.  Discussed potential of adverse effects of medication.  Patient okay with taking medication.  Will follow up as next visit for reevaluation.

## 2022-08-24 ENCOUNTER — Other Ambulatory Visit
Admission: RE | Admit: 2022-08-24 | Discharge: 2022-08-24 | Disposition: A | Discharge status: Home / Self Care | Payer: Medicare Other | Source: Ambulatory Visit | Attending: Physician Assistant | Admitting: Physician Assistant

## 2022-08-24 ENCOUNTER — Encounter: Payer: Medicare Other | Attending: Physician Assistant | Admitting: Physician Assistant

## 2022-08-24 DIAGNOSIS — D513 Other dietary vitamin B12 deficiency anemia: Secondary | ICD-10-CM | POA: Insufficient documentation

## 2022-08-24 DIAGNOSIS — L97125 Non-pressure chronic ulcer of left thigh with muscle involvement without evidence of necrosis: Secondary | ICD-10-CM | POA: Diagnosis not present

## 2022-08-24 DIAGNOSIS — L97812 Non-pressure chronic ulcer of other part of right lower leg with fat layer exposed: Secondary | ICD-10-CM | POA: Insufficient documentation

## 2022-08-24 DIAGNOSIS — B999 Unspecified infectious disease: Secondary | ICD-10-CM | POA: Diagnosis present

## 2022-08-24 DIAGNOSIS — M328 Other forms of systemic lupus erythematosus: Secondary | ICD-10-CM | POA: Diagnosis not present

## 2022-08-24 DIAGNOSIS — L98493 Non-pressure chronic ulcer of skin of other sites with necrosis of muscle: Secondary | ICD-10-CM | POA: Insufficient documentation

## 2022-08-26 NOTE — Progress Notes (Signed)
Kristine, BARTL [name] ([id number]) [phone] [email or web address] Page 1 of 5 Visit Report for 08/24/2022 Abuse Risk Screen Details Patient Name: Date of Service: [name] [name]. 08/24/2022 7:45 A M Medical Record Number: [id number] Patient Account Number: [id number] Date of Birth/Sex: Treating RN: 16-Apr-1956 (66 y.o. [name] Primary Care Brooklin Rieger: Jon Billings Other Clinician: Referring Adriauna Campton: Treating Imane Burrough/Extender: Venia Minks in Treatment: 0 Abuse Risk Screen Items Answer ABUSE RISK SCREEN: Has anyone close to you tried to hurt or harm you recentlyo No Do you feel uncomfortable with anyone in your familyo No Has anyone forced you do things that you didnt want to doo No Electronic Signature(s) Signed: 08/26/2022 5:57:01 PM By: Gretta Cool, BSN, RN, CWS, [name] RN, BSN Entered By: Gretta Cool, BSN, RN, CWS, [name] on 08/24/2022 08:08:05 -------------------------------------------------------------------------------- Activities of Daily Living Details Patient Name: Date of Service: [name] [name]. 08/24/2022 7:45 A M Medical Record Number: [id number] Patient Account Number: [id number] Date of Birth/Sex: Treating RN: July 28, 1956 (66 y.o. [name] Primary Care Nguyen Todorov: Jon Billings Other Clinician: Referring Kayleeann Huxford: Treating Carmeron Heady/Extender: Venia Minks in Treatment: 0 Activities of Daily Living Items Answer Activities of Daily Living (Please select one for each item) Drive Automobile Completely Able T Medications ake Completely Able Use T elephone Completely Able Care for Appearance Completely Able Use T oilet Completely Able Bath / Shower Completely Able Dress Self Completely Able Feed Self Completely Able Walk Completely Able Get In / Out Bed Completely Able Housework Completely CHEYENNA, PANKOWSKI [name] ([id number]) [phone] [email or web address] Page 2 of 5 Prepare Meals  Completely Able Handle Money Completely Able Shop for Self Completely Able Electronic Signature(s) Signed: 08/26/2022 5:57:01 PM By: Gretta Cool, BSN, RN, CWS, [name] RN, BSN Entered By: Gretta Cool, BSN, RN, CWS, [name] on 08/24/2022 37:85:88 -------------------------------------------------------------------------------- Education Screening Details Patient Name: Date of Service: [name] [name]. 08/24/2022 7:45 A M Medical Record Number: [id number] Patient Account Number: [id number] Date of Birth/Sex: Treating RN: 1956/06/06 (66 y.o. [name] Primary Care Oralia Criger: Jon Billings Other Clinician: Referring Anarie Kalish: Treating Waylin Dorko/Extender: Venia Minks in Treatment: 0 Primary Learner Assessed: Patient Learning Preferences/Education Level/Primary Language Learning Preference: Explanation Preferred Language: English Cognitive Barrier Language Barrier: No Translator Needed: No Memory Deficit: No Emotional Barrier: No Cultural/Religious Beliefs Affecting Medical Care: No Physical Barrier Impaired Vision: Yes Glasses Impaired Hearing: No Decreased Hand dexterity: No Knowledge/Comprehension Knowledge Level: High Comprehension Level: High Ability to understand written instructions: High Ability to understand verbal instructions: High Motivation Anxiety Level: Calm Cooperation: Cooperative Education Importance: Acknowledges Need Interest in Health Problems: Asks Questions Perception: Coherent Willingness to Engage in Self-Management High Activities: Readiness to Engage in Self-Management High Activities: Engineer, maintenance) Signed: 08/26/2022 5:57:01 PM By: Gretta Cool, BSN, RN, CWS, [name] RN, BSN Entered By: Gretta Cool, BSN, RN, CWS, [name] on 08/24/2022 08:08:50 Kristine, DEJOSEPH [name] ([id number]) [id number] Page 3 of 5 -------------------------------------------------------------------------------- Fall Risk Assessment  Details Patient Name: Date of Service: [name] [name]. 08/24/2022 7:45 A M Medical Record Number: [id number] Patient Account Number: [id number] Date of Birth/Sex: Treating RN: 10-21-1956 (66 y.o. [name] Primary Care Jaedynn Bohlken: Jon Billings Other Clinician: Referring Shaquan Missey: Treating Nashton Belson/Extender: Venia Minks in Treatment: 0 Fall Risk Assessment Items Have you had 2 or more falls in the last 12 monthso 0 Yes Have you had any fall that resulted in injury in the last 12 monthso 0 Yes FALLS RISK SCREEN History of falling - immediate or within  3 months 25 Yes Secondary diagnosis (Do you have 2 or more medical diagnoseso) 0 No Ambulatory aid None/bed rest/wheelchair/nurse 0 Yes Crutches/cane/walker 0 No Furniture 0 No Intravenous therapy Access/Saline/Heparin Lock 0 No Gait/Transferring Normal/ bed rest/ wheelchair 0 Yes Weak (short steps with or without shuffle, stooped but able to lift head while walking, may seek 0 No support from furniture) Impaired (short steps with shuffle, may have difficulty arising from chair, head down, impaired 0 No balance) Mental Status Oriented to own ability 0 Yes Electronic Signature(s) Signed: 08/26/2022 5:57:01 PM By: Gretta Cool, BSN, RN, CWS, [name] RN, BSN Entered By: Gretta Cool, BSN, RN, CWS, [name] on 08/24/2022 08:09:09 -------------------------------------------------------------------------------- Foot Assessment Details Patient Name: Date of Service: [name] [name]. 08/24/2022 7:45 A M Medical Record Number: [id number] Patient Account Number: [id number] Date of Birth/Sex: Treating RN: May 11, 1956 (66 y.o. [name] Primary Care Ceola Para: Jon Billings Other Clinician: Referring Jan Walters: Treating Anapaola Kinsel/Extender: Venia Minks in Treatment: 0 Foot Assessment Items Site Locations [place], [place] [name] ([id number]) [phone] [email or web address] Page 4 of 5 +  = Sensation present, - = Sensation absent, C = Callus, U = Ulcer R = Redness, W = Warmth, M = Maceration, PU = Pre-ulcerative lesion F = Fissure, S = Swelling, D = Dryness Assessment Right: Left: Other Deformity: No No Prior Foot Ulcer: No No Prior Amputation: No No Charcot Joint: No No Ambulatory Status: Ambulatory Without Help Gait: Steady Electronic Signature(s) Signed: 08/26/2022 5:57:01 PM By: Gretta Cool, BSN, RN, CWS, [name] RN, BSN Entered By: Gretta Cool, BSN, RN, CWS, [name] on 08/24/2022 08:10:22 -------------------------------------------------------------------------------- Nutrition Risk Screening Details Patient Name: Date of Service: [name] [name]. 08/24/2022 7:45 A M Medical Record Number: [id number] Patient Account Number: [id number] Date of Birth/Sex: Treating RN: Apr 01, 1956 (66 y.o. [name], [name] Primary Care Marietta Sikkema: Jon Billings Other Clinician: Referring Darryll Raju: Treating Nalin Mazzocco/Extender: Venia Minks in Treatment: 0 Height (in): 66 Weight (lbs): 188 Body Mass Index (BMI): 30.3 Nutrition Risk Screening Items Score Screening NUTRITION RISK SCREEN: [name] have an illness or condition that made me change the kind and/or amount of food [name] eat 2 Yes [name] eat fewer than two meals per day 3 Yes [name] eat few fruits and vegetables, or milk products 0 No [name] have three or more drinks of beer, liquor or wine almost every day 0 No [name] have tooth or mouth problems that make it hard for me to eat 0 No [name] don't always have enough money to buy the food [name] need 0 No BIANCE, MONCRIEF [name] ([id number]) [phone] [email or web address] Page 5 of 5 [name] eat alone most of the time 0 No [name] take three or more different prescribed or over-the-counter drugs a day 1 Yes Without wanting to, [name] have lost or gained 10 pounds in the last six months 0 No [name] am not always physically able to shop, cook and/or feed myself 0 No Nutrition Protocols Good Risk Protocol Moderate Risk  Protocol High Risk Proctocol 0 Provide education on nutrition Risk Level: High Risk Score: 6 Electronic Signature(s) Signed: 08/26/2022 5:57:01 PM By: Gretta Cool, BSN, RN, CWS, [name] RN, BSN Entered By: Gretta Cool, BSN, RN, CWS, [name] on 08/24/2022 08:09:44

## 2022-08-27 ENCOUNTER — Telehealth: Payer: Self-pay

## 2022-08-27 DIAGNOSIS — L98493 Non-pressure chronic ulcer of skin of other sites with necrosis of muscle: Secondary | ICD-10-CM | POA: Diagnosis not present

## 2022-08-27 DIAGNOSIS — L97812 Non-pressure chronic ulcer of other part of right lower leg with fat layer exposed: Secondary | ICD-10-CM | POA: Diagnosis not present

## 2022-08-27 DIAGNOSIS — M328 Other forms of systemic lupus erythematosus: Secondary | ICD-10-CM | POA: Diagnosis not present

## 2022-08-27 DIAGNOSIS — D513 Other dietary vitamin B12 deficiency anemia: Secondary | ICD-10-CM | POA: Diagnosis not present

## 2022-08-27 LAB — AEROBIC CULTURE W GRAM STAIN (SUPERFICIAL SPECIMEN): Gram Stain: NONE SEEN

## 2022-08-27 NOTE — Progress Notes (Signed)
[name], [name] ([id number]) [id number] Page 1 of 11 Visit Report for 08/24/2022 Allergy List Details Patient Name: Date of Service: [name]. 08/24/2022 7:45 A M Medical Record Number: [id number] Patient Account Number: [id number] Date of Birth/Sex: Treating RN: [birth year] (66 y.o. [name] Primary Care [name]: Jon Billings Other Clinician: Referring [name]: Treating [name]/Extender: Venia Minks in Treatment: 0 Allergies Active Allergies Sulfa (Sulfonamide Antibiotics) beef derived (bovine) lamb Type: Food milk Allergy Notes Electronic Signature(s) Signed: 08/26/2022 5:57:01 PM By: Gretta Cool, BSN, RN, CWS, [name] RN, BSN Entered By: Gretta Cool, BSN, RN, CWS, [name] on 08/24/2022 08:11:23 -------------------------------------------------------------------------------- Arrival Information Details Patient Name: Date of Service: [name]. 08/24/2022 7:45 A M Medical Record Number: [id number] Patient Account Number: [id number] Date of Birth/Sex: Treating RN: [birth year] (66 y.o. [name] Primary Care [name]: Jon Billings Other Clinician: Referring [name]: Treating [name]/Extender: Venia Minks in Treatment: 0 Visit Information Patient Arrived: Ambulatory Arrival Time: 08:00 Accompanied By: self Transfer Assistance: None Patient Identification Verified: Yes Secondary Verification Process Completed: Yes Patient Has AlertsFEMALE, [name] ([id number]) [id number] Page 2 of 11 Patient Alerts: NOT Diabetic Electronic Signature(s) Signed: 08/26/2022 5:57:01 PM By: Gretta Cool, BSN, RN, CWS, [name] RN, BSN Entered By: Gretta Cool, BSN, RN, CWS, [name] on 08/24/2022 08:00:45 -------------------------------------------------------------------------------- Clinic Level of Care Assessment Details Patient Name: Date of Service: [name].  08/24/2022 7:45 A M Medical Record Number: [id number] Patient Account Number: [id number] Date of Birth/Sex: Treating RN: Apr 03, 1956 (66 y.o. [name] Primary Care Lott Seelbach: Jon Billings Other Clinician: Referring Haniyyah Sakuma: Treating [name]/Extender: Venia Minks in Treatment: 0 Clinic Level of Care Assessment Items TOOL 1 Quantity Score X- 1 0 Use when EandM and Procedure is performed on INITIAL visit ASSESSMENTS - Nursing Assessment / Reassessment X- 1 20 General Physical Exam (combine w/ comprehensive assessment (listed just below) when performed on new pt. evals) X- 1 25 Comprehensive Assessment (HX, ROS, Risk Assessments, Wounds Hx, etc.) ASSESSMENTS - Wound and Skin Assessment / Reassessment '[]'$  - 0 Dermatologic / Skin Assessment (not related to wound area) ASSESSMENTS - Ostomy and/or Continence Assessment and Care '[]'$  - 0 Incontinence Assessment and Management '[]'$  - 0 Ostomy Care Assessment and Management (repouching, etc.) PROCESS - Coordination of Care '[]'$  - 0 Simple Patient / Family Education for ongoing care X- 1 20 Complex (extensive) Patient / Family Education for ongoing care '[]'$  - 0 Staff obtains Programmer, systems, Records, T Results / Process Orders est '[]'$  - 0 Staff telephones HHA, Nursing Homes / Clarify orders / etc '[]'$  - 0 Routine Transfer to another Facility (non-emergent condition) '[]'$  - 0 Routine Hospital Admission (non-emergent condition) X- 1 15 New Admissions / Biomedical engineer / Ordering NPWT Apligraf, etc. , '[]'$  - 0 Emergency Hospital Admission (emergent condition) PROCESS - Special Needs '[]'$  - 0 Pediatric / Minor Patient Management '[]'$  - 0 Isolation Patient Management '[]'$  - 0 Hearing / Language / Visual special needs '[]'$  - 0 Assessment of Community assistance (transportation, D/C planning, etc.) '[]'$  - 0 Additional assistance / Altered mentation '[]'$  - 0 Support Surface(s) Assessment (bed, cushion, seat,  etc.) INTERVENTIONS - Miscellaneous '[]'$  - 0 External ear exam Kristine, [name] [name] ([id number]) [id number] Page 3 of 11 '[]'$  - 0 Patient Transfer (multiple staff / Civil Service fast streamer / Similar devices) '[]'$  - 0 Simple Staple / Suture removal (25 or less) '[]'$  - 0 Complex Staple / Suture removal (26 or more) '[]'$  - 0  Hypo/Hyperglycemic Management (do not check if billed separately) '[]'$  - 0 Ankle / Brachial Index (ABI) - do not check if billed separately Has the patient been seen at the hospital within the last three years: Yes Total Score: 80 Level Of Care: New/Established - Level 3 Electronic Signature(s) Signed: 08/27/2022 9:30:18 AM By: Carlene Coria RN Entered By: Carlene Coria on 08/24/2022 12:28:30 -------------------------------------------------------------------------------- Encounter Discharge Information Details Patient Name: Date of Service: [name]. 08/24/2022 7:45 A M Medical Record Number: [id number] Patient Account Number: [id number] Date of Birth/Sex: Treating RN: 1955-10-24 (66 y.o. [name] Primary Care [name]: Jon Billings Other Clinician: Referring [name]: Treating [name]/Extender: Venia Minks in Treatment: 0 Encounter Discharge Information Items Post Procedure Vitals Discharge Condition: Stable Temperature (F): 98.1 Ambulatory Status: Ambulatory Pulse (bpm): 81 Discharge Destination: Home Respiratory Rate (breaths/min): 18 Transportation: Private Auto Blood Pressure (mmHg): 149/80 Accompanied By: self Schedule Follow-up Appointment: Yes Clinical Summary of Care: Electronic Signature(s) Signed: 08/24/2022 12:39:21 PM By: Carlene Coria RN Entered By: Carlene Coria on 08/24/2022 12:39:20 -------------------------------------------------------------------------------- Lower Extremity Assessment Details Patient Name: Date of Service: [name]. 08/24/2022 7:45 A M Medical Record  Number: [id number] Patient Account Number: [id number] Date of Birth/Sex: Treating RN: 27-May-1956 (66 y.o. [name] Primary Care Jazmynn Pho: Jon Billings Other Clinician: Referring Jamille Yoshino: Treating Sharea Guinther/Extender: Venia Minks in Treatment: [address], [place] [name] ([id number]) [phone].pdf Page 4 of 11 Vascular Assessment Pulses: Dorsalis Pedis Palpable: [Left:Yes] [Right:Yes] Electronic Signature(s) Signed: 08/26/2022 5:57:01 PM By: Gretta Cool, BSN, RN, CWS, [name] RN, BSN Entered By: Gretta Cool, BSN, RN, CWS, [name] on 08/24/2022 08:31:49 -------------------------------------------------------------------------------- Multi Wound Chart Details Patient Name: Date of Service: [name]. 08/24/2022 7:45 A M Medical Record Number: [id number] Patient Account Number: [id number] Date of Birth/Sex: Treating RN: Sep 26, 1956 (66 y.o. [name] Primary Care Tarah Buboltz: Jon Billings Other Clinician: Referring Leanna Hamid: Treating Pearlene Teat/Extender: Venia Minks in Treatment: 0 Vital Signs Height(in): 66 Pulse(bpm): 7 Weight(lbs): 188 Blood Pressure(mmHg): 149/80 Body Mass Index(BMI): 30.3 Temperature(F): 98.1 Respiratory Rate(breaths/min): 16 [1:Photos:] [2:No Photos] Left, Proximal Upper Leg Left, Distal Upper Leg Right Knee Wound Location: Trauma Trauma Trauma Wounding Event: Trauma, Other Trauma, Other Trauma, Other Primary Etiology: Anemia, Hypertension, Lupus N/A Anemia, Hypertension, Lupus Comorbid History: Erythematosus, Osteoarthritis, Erythematosus, Osteoarthritis, Received Radiation Received Radiation 08/11/2022 08/11/2022 08/11/2022 Date Acquired: 0 0 0 Weeks of Treatment: Open Open Open Wound Status: No No No Wound Recurrence: Yes No No Clustered Wound: 3 N/A N/A Clustered Quantity: 4x5x0.2 14x9x0.2 3x5x1.7 Measurements L x W x D (cm) 15.708 98.96 11.781 A (cm) : rea 3.142  19.792 20.028 Volume (cm) : Full Thickness Without Exposed N/A Full Thickness Without Exposed Classification: Support Structures Support Structures Medium N/A Large Exudate A mount: Serous N/A Serosanguineous Exudate Type: amber N/A red, brown Exudate Color: None Present (0%) N/A None Present (0%) Granulation Amount: Large (67-100%) N/A Large (67-100%) Necrotic Amount: Adherent Slough N/A Eschar Necrotic Tissue: Fat Layer (Subcutaneous Tissue): Yes N/A Fat Layer (Subcutaneous Tissue): Yes Exposed Structures: Fascia: No Fascia: No MYKELLE, COCKERELL [name] ([id number]) [id number] Page 5 of 11 Tendon: No Tendon: No Muscle: No Muscle: No Joint: No Joint: No Bone: No Bone: No None N/A None Epithelialization: Treatment Notes Electronic Signature(s) Signed: 08/26/2022 5:57:01 PM By: Gretta Cool, BSN, RN, CWS, [name] RN, BSN Entered By: Gretta Cool, BSN, RN, CWS, [name] on 08/24/2022 09:03:05 -------------------------------------------------------------------------------- Multi-Disciplinary Care Plan Details Patient Name: Date of Service: Azucena Fallen BETH [name]. 08/24/2022 7:45  A M Medical Record Number: [id number] Patient Account Number: [id number] Date of Birth/Sex: Treating RN: 1956-06-07 (66 y.o. [name] Primary Care Greidy Sherard: Jon Billings Other Clinician: Referring Clarisse Rodriges: Treating Madina Galati/Extender: Venia Minks in Treatment: 0 Active Inactive Abuse / Safety / Falls / Self Care Management Nursing Diagnoses: History of Falls Goals: Patient will remain injury free related to falls Date Initiated: 08/24/2022 Target Resolution Date: 08/24/2022 Goal Status: Active Patient/caregiver will verbalize understanding of skin care regimen Date Initiated: 08/24/2022 Target Resolution Date: 08/24/2022 Goal Status: Active Interventions: Assess fall risk on admission and as needed Notes: Necrotic Tissue Nursing Diagnoses: Impaired tissue  integrity related to necrotic/devitalized tissue Knowledge deficit related to management of necrotic/devitalized tissue Goals: Necrotic/devitalized tissue will be minimized in the wound bed Date Initiated: 08/24/2022 Target Resolution Date: 08/24/2022 Goal Status: Active Patient/caregiver will verbalize understanding of reason and process for debridement of necrotic tissue Date Initiated: 08/24/2022 Target Resolution Date: 08/24/2022 Goal Status: Active Interventions: Assess patient pain level pre-, during and post procedure and prior to discharge Provide education on necrotic tissue and debridement process Treatment Activities: KIERSTAN, AUER [name] ([id number]) [id number] Page 6 of 11 Apply topical anesthetic as ordered : 08/24/2022 Excisional debridement : 08/24/2022 Notes: Nutrition Nursing Diagnoses: Imbalanced nutrition Impaired glucose control: actual or potential Potential for alteratiion in Nutrition/Potential for imbalanced nutrition Goals: Patient/caregiver agrees to and verbalizes understanding of need to use nutritional supplements and/or vitamins as prescribed Date Initiated: 08/24/2022 Target Resolution Date: 08/24/2022 Goal Status: Active Interventions: Provide education on nutrition Treatment Activities: Dietary management education, guidance and counseling : 08/24/2022 Notes: Orientation to the Wound Care Program Nursing Diagnoses: Knowledge deficit related to the wound healing center program Goals: Patient/caregiver will verbalize understanding of the [facility] Date Initiated: 08/24/2022 Target Resolution Date: 08/24/2022 Goal Status: Active Interventions: Provide education on orientation to the wound center Notes: Wound/Skin Impairment Nursing Diagnoses: Impaired tissue integrity Knowledge deficit related to ulceration/compromised skin integrity Goals: Patient/caregiver will verbalize understanding of skin care  regimen Date Initiated: 08/24/2022 Target Resolution Date: 08/24/2022 Goal Status: Active Ulcer/skin breakdown will have a volume reduction of 30% by week 4 Date Initiated: 08/24/2022 Target Resolution Date: 09/21/2022 Goal Status: Active Interventions: Assess patient/caregiver ability to obtain necessary supplies Assess ulceration(s) every visit Treatment Activities: Referred to DME Tryson Lumley for dressing supplies : 08/24/2022 Skin care regimen initiated : 08/24/2022 Topical wound management initiated : 08/24/2022 Notes: Electronic Signature(s) Signed: 08/24/2022 8:57:40 AM By: Gretta Cool, BSN, RN, CWS, [name] RN, BSN Entered By: Gretta Cool, BSN, RN, CWS, [name] on 08/24/2022 08:57:40 [name] ([id number]) [id number] Page 7 of 11 -------------------------------------------------------------------------------- Pain Assessment Details Patient Name: Date of Service: [name]. 08/24/2022 7:45 A M Medical Record Number: [id number] Patient Account Number: [id number] Date of Birth/Sex: Treating RN: 1955/12/04 (66 y.o. [name] Primary Care Kairie Vangieson: Jon Billings Other Clinician: Referring Milagro Belmares: Treating Amirr Achord/Extender: Venia Minks in Treatment: 0 Active Problems Location of Pain Severity and Description of Pain Patient Has Paino Yes Site Locations Pain Location: Pain in Ulcers Rate the pain. Current Pain Level: 3 Character of Pain Describe the Pain: Tender Pain Management and Medication Current Pain Management: Electronic Signature(s) Signed: 08/26/2022 5:57:01 PM By: Gretta Cool, BSN, RN, CWS, [name] RN, BSN Entered By: Gretta Cool, BSN, RN, CWS, [name] on 08/24/2022 08:01:04 -------------------------------------------------------------------------------- Patient/Caregiver Education Details Patient Name: Date of Service: [name]. 11/3/2023andnbsp7:45 [place] Record Number: [id number] Patient Account Number:  [id number] Date of Birth/Gender: Treating RN: 1956-04-03 (  66 y.o. [name] Primary Care Physician: Jon Billings Other Clinician: SHAMMARA, [name] ([id number]) [id number] Page 8 of 11 Referring Physician: Treating Physician/Extender: Venia Minks in Treatment: 0 Education Assessment Education Provided To: Patient Education Topics Provided Nutrition: Methods: Explain/Verbal Responses: State content correctly Keener: o Methods: Explain/Verbal Responses: State content correctly Wound Debridement: Methods: Explain/Verbal Responses: See progress note Electronic Signature(s) Signed: 08/27/2022 9:30:18 AM By: Carlene Coria RN Entered By: Carlene Coria on 08/24/2022 12:35:27 -------------------------------------------------------------------------------- Wound Assessment Details Patient Name: Date of Service: [name]. 08/24/2022 7:45 A M Medical Record Number: [id number] Patient Account Number: [id number] Date of Birth/Sex: Treating RN: 08-09-1956 (66 y.o. [name] Primary Care Keishon Chavarin: Jon Billings Other Clinician: Referring Jameal Razzano: Treating Shanisha Lech/Extender: Venia Minks in Treatment: 0 Wound Status Wound Number: 1 Primary Trauma, Other Etiology: Wound Location: Left, Proximal Upper Leg Wound Open Wounding Event: Trauma Status: Date Acquired: 08/11/2022 Comorbid Anemia, Hypertension, Lupus Erythematosus, Osteoarthritis, Weeks Of Treatment: 0 History: Received Radiation Clustered Wound: Yes Photos Wound Measurements [name], [name] ([id number]) Length: (cm) 4 Width: (cm) 5 Depth: (cm) 0.2 Clustered Quantity: 3 Area: (cm) 15.708 Volume: (cm) 3.142 [id number] Page 9 of 11 % Reduction in Area: % Reduction in Volume: Epithelialization: None Tunneling: No Undermining: No Wound  Description Classification: Full Thickness Without Exposed Sup Exudate Amount: Medium Exudate Type: Serous Exudate Color: amber port Structures Foul Odor After Cleansing: No Slough/Fibrino Yes Wound Bed Granulation Amount: None Present (0%) Exposed Structure Necrotic Amount: Large (67-100%) Fascia Exposed: No Necrotic Quality: Adherent Slough Fat Layer (Subcutaneous Tissue) Exposed: Yes Tendon Exposed: No Muscle Exposed: No Joint Exposed: No Bone Exposed: No Electronic Signature(s) Signed: 08/26/2022 5:57:01 PM By: Gretta Cool, BSN, RN, CWS, [name] RN, BSN Entered By: Gretta Cool, BSN, RN, CWS, [name] on 08/24/2022 08:28:01 -------------------------------------------------------------------------------- Wound Assessment Details Patient Name: Date of Service: [name]. 08/24/2022 7:45 A M Medical Record Number: [id number] Patient Account Number: [id number] Date of Birth/Sex: Treating RN: [birth year] (66 y.o. [name] Primary Care Parrish Daddario: Jon Billings Other Clinician: Referring Arliss Frisina: Treating Riah Kehoe/Extender: Venia Minks in Treatment: 0 Wound Status Wound Number: 2 Primary Trauma, Other Etiology: Wound Location: Left, Distal Upper Leg Wound Open Wounding Event: Trauma Status: Date Acquired: 08/11/2022 Comorbid Anemia, Hypertension, Lupus Erythematosus, Osteoarthritis, Weeks Of Treatment: 0 History: Received Radiation Clustered Wound: No Photos Wound Measurements Length: (cm) 14 [name], [name] ([id number]) Width: (cm) 9 Depth: (cm) 5 Area: (cm) 98.96 Volume: (cm) 494.80 % Reduction in Area: [id number] Page 10 of 11 % Reduction in Volume: Undermining: Yes Starting Position (o'clock): 12 1 Ending Position (o'clock): 12 Maximum Distance: (cm) 9 Wound Description Classification: Full Thickness With Exposed Suppor Wound Margin: Well defined, not attached Exudate Amount: Large Exudate Type:  Serosanguineous Exudate Color: red, brown t Structures Foul Odor After Cleansing: No Slough/Fibrino No Wound Bed Granulation Amount: None Present (0%) Exposed Structure Necrotic Amount: Large (67-100%) Fascia Exposed: No Necrotic Quality: Eschar Fat Layer (Subcutaneous Tissue) Exposed: Yes Tendon Exposed: No Muscle Exposed: Yes Necrosis of Muscle: Yes Joint Exposed: No Bone Exposed: No Electronic Signature(s) Signed: 08/24/2022 10:37:05 AM By: [name] PA-C Signed: 08/26/2022 5:57:01 PM By: Gretta Cool, BSN, RN, CWS, [name] RN, BSN Entered By: [name] on 08/24/2022 10:37:04 -------------------------------------------------------------------------------- Wound Assessment Details Patient Name: Date of Service: [name]. 08/24/2022 7:45 A M Medical Record Number: [id number] Patient Account Number: [id number] Date of Birth/Sex: Treating RN: August 26, 1956 (  66 y.o. [name] Primary Care Derril Franek: Jon Billings Other Clinician: Referring Colonel Krauser: Treating Cabrina Shiroma/Extender: Venia Minks in Treatment: 0 Wound Status Wound Number: 3 Primary Trauma, Other Etiology: Wound Location: Right Knee Wound Open Wounding Event: Trauma Status: Date Acquired: 08/11/2022 Comorbid Anemia, Hypertension, Lupus Erythematosus, Osteoarthritis, Weeks Of Treatment: 0 History: Received Radiation Clustered Wound: No Photos [name], [name] ([id number]) [id number] Page 11 of 11 Wound Measurements Length: (cm) 3 Width: (cm) 5 Depth: (cm) 1.7 Area: (cm) 11.781 Volume: (cm) 20.028 % Reduction in Area: % Reduction in Volume: Epithelialization: None Tunneling: No Undermining: No Wound Description Classification: Full Thickness Without Exposed Support Exudate Amount: Large Exudate Type: Serosanguineous Exudate Color: red, brown Structures Foul Odor After Cleansing: No Slough/Fibrino Yes Wound Bed Granulation Amount:  None Present (0%) Exposed Structure Necrotic Amount: Large (67-100%) Fascia Exposed: No Necrotic Quality: Eschar Fat Layer (Subcutaneous Tissue) Exposed: Yes Tendon Exposed: No Muscle Exposed: No Joint Exposed: No Bone Exposed: No Electronic Signature(s) Signed: 08/26/2022 5:57:01 PM By: Gretta Cool, BSN, RN, CWS, [name] RN, BSN Entered By: Gretta Cool, BSN, RN, CWS, [name] on 08/24/2022 08:31:10 -------------------------------------------------------------------------------- [place] Details Patient Name: Date of Service: [name]. 08/24/2022 7:45 A M Medical Record Number: [id number] Patient Account Number: [id number] Date of Birth/Sex: Treating RN: 1956-06-13 (66 y.o. [name], [name] Primary Care Sheelah Ritacco: Jon Billings Other Clinician: Referring Arham Symmonds: Treating Demitrios Molyneux/Extender: Venia Minks in Treatment: 0 Vital Signs Time Taken: 08:01 Temperature (F): 98.1 Height (in): 66 Pulse (bpm): 81 Source: Measured Respiratory Rate (breaths/min): 16 Weight (lbs): 188 Blood Pressure (mmHg): 149/80 Source: Measured Reference Range: 80 - 120 mg / dl Body Mass Index (BMI): 30.3 Electronic Signature(s) Signed: 08/26/2022 5:57:01 PM By: Gretta Cool, BSN, RN, CWS, [name] RN, BSN Entered By: Gretta Cool, BSN, RN, CWS, [name] on 08/24/2022 08:01:39

## 2022-08-27 NOTE — Progress Notes (Signed)
ROBERTINE, KIPPER [name] ([id number]) [id number] Page 1 of 12 Visit Report for 08/24/2022 Chief Complaint Document Details Patient Name: Date of Service: Kristine Mater [name]. 08/24/2022 7:45 A M Medical Record Number: [id number] Patient Account Number: [id number] Date of Birth/Sex: Treating RN: 1956/05/15 (66 y.o. [name] Primary Care Provider: Jon Billings Other Clinician: Referring Provider: Treating Provider/Extender: Venia Minks in Treatment: 0 Information Obtained from: Patient Chief Complaint Right knee and left thigh ulcers secondary to a dog bite and fall Electronic Signature(s) Signed: 08/24/2022 10:56:00 AM By: [name] PA-C Entered By: [name] on 08/24/2022 10:56:00 -------------------------------------------------------------------------------- Debridement Details Patient Name: Date of Service: Kristine Mater [name]. 08/24/2022 7:45 A M Medical Record Number: [id number] Patient Account Number: [id number] Date of Birth/Sex: Treating RN: 01/18/1956 (66 y.o. [name] Primary Care Provider: Jon Billings Other Clinician: Referring Provider: Treating Provider/Extender: Venia Minks in Treatment: 0 Debridement Performed for Assessment: Wound #3 Right Knee Performed By: Physician [name] Sams., PA-C Debridement Type: Debridement Level of Consciousness (Pre-procedure): Awake and Alert Pre-procedure Verification/Time Out Yes - 09:00 Taken: T Area Debrided (L x W): otal 3 (cm) x 5 (cm) = 15 (cm) Tissue and other material debrided: Viable, Non-Viable, Eschar, Fat, Slough, Subcutaneous, Slough, Other: removed 5 stiches Level: Skin/Subcutaneous Tissue Debridement Description: Excisional Instrument: Blade, Curette, Forceps, Scissors Bleeding: Minimum Hemostasis Achieved: Pressure Response to Treatment: Procedure was tolerated well Level of Consciousness (Post- Awake and  Alert procedure): Post Debridement Measurements of Total Wound [name], [name] ([id number]) [id number] Page 2 of 12 Length: (cm) 3 Width: (cm) 5 Depth: (cm) 1.7 Volume: (cm) 20.028 Character of Wound/Ulcer Post Debridement: Requires Further Debridement Post Procedure Diagnosis Same as Pre-procedure Electronic Signature(s) Signed: 08/24/2022 1:54:42 PM By: [name] PA-C Signed: 08/26/2022 5:57:01 PM By: Gretta Cool, BSN, RN, CWS, [name] RN, BSN Entered By: Gretta Cool, BSN, RN, CWS, [name] on 08/24/2022 09:06:39 -------------------------------------------------------------------------------- Debridement Details Patient Name: Date of Service: Kristine Mater [name]. 08/24/2022 7:45 A M Medical Record Number: [id number] Patient Account Number: [id number] Date of Birth/Sex: Treating RN: Apr 07, 1956 (66 y.o. [name] Primary Care Provider: Jon Billings Other Clinician: Referring Provider: Treating Provider/Extender: Venia Minks in Treatment: 0 Debridement Performed for Assessment: Wound #2 Left,Distal Upper Leg Performed By: Physician [name] Sams., PA-C Debridement Type: Debridement Level of Consciousness (Pre-procedure): Awake and Alert Pre-procedure Verification/Time Out Yes - 09:05 Taken: Start Time: 09:05 Pain Control: Lidocaine 4% T opical Solution T Area Debrided (L x W): otal 14 (cm) x 9 (cm) = 126 (cm) Viable, Non-Viable, Blood Clots, Eschar, Fat, Muscle, Slough, Subcutaneous, Skin: Dermis , Skin: Epidermis, Tissue and other material debrided: Slough, Other: removed 5 stiches Level: Skin/Subcutaneous Tissue/Muscle Debridement Description: Excisional Instrument: Blade, Curette, Forceps, Scissors Specimen: Swab, Number of Specimens T aken: 1 Bleeding: Moderate Hemostasis Achieved: Silver Nitrate End Time: 09:30 Procedural Pain: 2 Post Procedural Pain: 0 Response to Treatment: Procedure was tolerated well Level of  Consciousness (Post- Awake and Alert procedure): Post Debridement Measurements of Total Wound Length: (cm) 14 Width: (cm) 9 Depth: (cm) 5 Volume: (cm) 494.801 Character of Wound/Ulcer Post Debridement: Requires Further Debridement Post Procedure Diagnosis Same as Pre-procedure Notes This was an extensive in office debridement which took about 25 minutes in total. Subsequently the patient did have a wound initially that appeared to be more superficial once the sutures were removed this actually went much more deep than what was initially expected. In fact she had  2 seroma pockets one underlying the immediate portion of the wound and then the other was actually further up in the thigh proximally. [name] would estimate that this was somewhere [name], [name] ([id number]) [id number] Page 3 of 12 around 20 ounces of fluid which was collected and what seems to be almost somewhat of a seroma. It was not necessarily purulent but did have somewhat of an oily appearance. Nonetheless [name] did clean out as much of this as [name] could expressing from the upper thigh region. We then following the significant debridement achieved hemostasis of any areas that were bleeding in order to prevent ongoing bleeding as she progresses through the weekend. The patient was appreciative of this and tolerated the procedure very well with only minimal discomfort at times. In fact it was mainly in the proximal area of the wound where [name] was clearing out the seroma where she noticed most of her discomfort. Electronic Signature(s) Signed: 08/24/2022 10:54:50 AM By: [name] PA-C Signed: 08/26/2022 5:57:01 PM By: Gretta Cool, BSN, RN, CWS, [name] RN, BSN Entered By: [name] on 08/24/2022 10:54:50 -------------------------------------------------------------------------------- HPI Details Patient Name: Date of Service: Kristine Mater [name]. 08/24/2022 7:45 A M Medical Record Number:  [id number] Patient Account Number: [id number] Date of Birth/Sex: Treating RN: Feb 14, 1956 (66 y.o. [name] Primary Care Provider: Jon Billings Other Clinician: Referring Provider: Treating Provider/Extender: Venia Minks in Treatment: 0 History of Present Illness HPI Description: 08-24-2022 patient presents today for initial evaluation here in our clinic concerning issues that she has been having with wounds in 3 locations. 1 areas in the proximal left thigh this is due to a fall that she sustained on 08-11-2022. Subsequently she has then a second issue in the distal thigh as well as the right knee location. These are due to a unusual situation where she was actually leaving had just been petting her grand dog when she walked to her car. She tells me that she got dizzy and stumbled and fell and when she fell she called out. When she did this subsequently her grand dog ran over were not sure if that he thought he was playing with her what but nonetheless ended up biting her on the left thigh as well as on the right knee. Here she had 2 areas of laceration which she subsequently went to the ER for and they attempted to suture both closed. Unfortunately that just did not take. At this point [name] think the sutures can need to be removed from both locations and to be honest there is a significant area of necrosis on the left thigh this may need to be cleaned out and away. Fortunately I do not see any signs of systemic infection though locally there is some evidence of infection she has been on Augmentin which just recently switched to doxycycline no cultures been performed at this point. From the standpoint of past medical history the patient does have a history of anemia which they believe is related to B12. She does not have diabetes she does have a history of lupus however. She is a former smoker. Electronic Signature(s) Signed: 08/24/2022 11:13:20 AM By: [name]  PA-C Entered By: [name] on 08/24/2022 11:13:20 -------------------------------------------------------------------------------- Physical Exam Details Patient Name: Date of Service: Kristine Mater [name]. 08/24/2022 7:45 A M Medical Record Number: [id number] Patient Account Number: [id number] Date of Birth/Sex: Treating RN: Mar 09, 1956 (66 y.o. [name] Primary Care Provider: Jon Billings Other Clinician:  Referring Provider: Treating Provider/Extender: Venia Minks in Treatment: [address], [place] [name] ([id number]) [id number] Page 4 of 12 Constitutional patient is hypertensive.. pulse regular and within target range for patient.Marland Kitchen respirations regular, non-labored and within target range for patient.Marland Kitchen temperature within target range for patient.. Obese and well-hydrated in no acute distress. Eyes conjunctiva clear no eyelid edema noted. pupils equal round and reactive to light and accommodation. Ears, Nose, Mouth, and Throat no gross abnormality of ear auricles or external auditory canals. normal hearing noted during conversation. mucus membranes moist. Respiratory normal breathing without difficulty. Cardiovascular 2+ dorsalis pedis/posterior tibialis pulses. no clubbing, cyanosis, significant edema, <3 sec cap refill. Musculoskeletal normal gait and posture. no significant deformity or arthritic changes, no loss or range of motion, no clubbing. Psychiatric this patient is able to make decisions and demonstrates good insight into disease process. Alert and Oriented x 3. pleasant and cooperative. Electronic Signature(s) Signed: 08/24/2022 1:49:09 PM By: [name] PA-C Entered By: [name] on 08/24/2022 13:49:09 -------------------------------------------------------------------------------- Physician Orders Details Patient Name: Date of Service: Kristine Mater [name]. 08/24/2022 7:45 A M Medical Record  Number: [id number] Patient Account Number: [id number] Date of Birth/Sex: Treating RN: 05-28-1956 (66 y.o. 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 Anesthetic (Use 'Patient Medications' Section for Anesthetic Order Entry) Lidocaine applied to wound bed Edema Control - Lymphedema / Segmental Compressive Device / Other Elevate, Exercise Daily and A void Standing for Long Periods of Time. Elevate legs to the level of the heart and pump ankles as often as possible Elevate leg(s) parallel to the floor when sitting. [name], [name] ([id number]) [id number] Page 5 of 12 Wound Treatment Wound #1 - Upper Leg Wound Laterality: Left, Proximal Prim Dressing: Xeroform 4x4-HBD (in/in) 1 x Per Day/30 Days ary Discharge Instructions: Apply Xeroform 4x4-HBD (in/in) as directed Secondary Dressing: Gauze 1 x Per Day/30 Days Discharge Instructions: As directed: dry, moistened with saline or moistened with Dakins Solution Secured With: Medipore T - 742M Medipore H Soft Cloth Surgical T ape ape, 2x2 (in/yd) 1 x Per Day/30 Days Wound #2 - Upper Leg Wound Laterality: Left, Distal Prim Dressing:  Gauze 1 x Per Day/30 Days ary Discharge Instructions: moistened with Dakins Solution Secondary Dressing: ABD Pad 5x9 (in/in) 1 x Per Day/30 Days Discharge Instructions: Cover with ABD pad Secondary Dressing: Gauze 1 x Per Day/30 Days Discharge Instructions: As directed: dry, moistened with saline or moistened with Dakins Solution Secured With: Medipore T - 742M Medipore H Soft Cloth Surgical T ape ape, 2x2 (in/yd) 1 x Per Day/30 Days Wound #3 - Knee Wound Laterality: Right Prim Dressing: Gauze 1 x Per Day/30 Days ary Discharge Instructions: moistened with Dakins Solution Secondary Dressing: (BORDER) Zetuvit Plus SILICONE BORDER Dressing 5x5 (in/in) 1 x Per Day/30 Days Discharge Instructions: Please do not put silicone bordered dressings under wraps. Use non-bordered  dressing only. Laboratory Bacteria identified in Wound by Culture (MICRO) - wound red, swollen, foul odor - (ICD10 L97.825 - Non-pressure chronic ulcer of other part of left lower leg with muscle involvement without evidence of necrosis) LOINC Code: 6759-1 Convenience Name: Wound culture routine Electronic Signature(s) Signed: 08/24/2022 1:54:42 PM By: [name] PA-C Signed: 08/27/2022 9:30:18 AM By: Carlene Coria RN Previous Signature: 08/24/2022 12:27:41 PM Version By: Carlene Coria RN Entered By: Carlene Coria on 08/24/2022 12:37:52 -------------------------------------------------------------------------------- Problem List Details Patient Name: Date of Service: Kristine Mater [name]. 08/24/2022 7:45 A M Medical Record Number: [id number] Patient Account Number: [id number] Date of Birth/Sex: Treating RN: [birth year] (66 y.o. [name] Primary Care Provider: Jon Billings Other Clinician: Referring Provider: Treating Provider/Extender: Venia Minks in Treatment: 0 Active Problems ICD-10 Encounter Code Description Active Date MDM Diagnosis W54.0XXA Bitten by dog, initial encounter  08/24/2022 No Yes [name], [name] ([id number]) [id number] Page 6 of 12 S80.12XA Contusion of left lower leg, initial encounter 08/24/2022 No Yes T79.2XXA Traumatic secondary and recurrent hemorrhage and seroma, initial encounter 08/24/2022 No Yes S80.812A Abrasion, left lower leg, initial encounter 08/24/2022 No Yes L97.823 Non-pressure chronic ulcer of other part of left lower leg with necrosis of 08/24/2022 No Yes muscle L98.492 Non-pressure chronic ulcer of skin of other sites with fat layer exposed 08/24/2022 No Yes D51.3 Other dietary vitamin B12 deficiency anemia 08/24/2022 No Yes M32.8 Other forms of systemic lupus erythematosus 08/24/2022 No Yes Inactive Problems Resolved Problems Electronic Signature(s) Signed: 08/24/2022 1:53:38 PM By: [name] PA-C Previous Signature: 08/24/2022 11:03:22 AM Version By: [name] PA-C Previous Signature: 08/24/2022 9:47:12 AM Version By: [name] PA-C Previous Signature: 08/24/2022 9:45:59 AM Version By: [name] PA-C Entered By: [name] on 08/24/2022 13:53:38 -------------------------------------------------------------------------------- Progress Note Details Patient Name: Date of Service: Kristine Mater [name]. 08/24/2022 7:45 A M Medical Record Number: [id number] Patient Account Number: [id number] Date of Birth/Sex: Treating RN: October 03, 1956 (66 y.o. [name] Primary Care Provider: Jon Billings Other Clinician: Referring Provider: Treating Provider/Extender: Venia Minks in Treatment: 0 Subjective Chief Complaint Information obtained from Patient Right knee and left thigh ulcers secondary to a dog bite and fall History of Present Illness (HPI) 08-24-2022 patient presents today for initial evaluation here in our clinic concerning issues that she has been having with wounds in 3 locations. 1 areas in the proximal left thigh this is due to a fall that she  sustained on 08-11-2022. Subsequently she has then a second issue in the distal thigh as well as the right knee location. These are due to a unusual situation where she was actually leaving had just been petting her grand dog when she walked to her car. She tells me that [name], [name] ([id number]) [id number] Page 7 of 12 she got dizzy and stumbled and fell and when she fell she called out. When she did this subsequently her grand dog ran over were not sure if that he thought he was playing with her what but nonetheless ended up biting her on the left thigh as well as on the right knee. Here she had 2 areas of laceration which she subsequently went to the ER for and they attempted to suture both closed. Unfortunately that just did not take. At this point [name] think the sutures can need to be removed from both locations and to be honest there is a significant area of necrosis on the left  thigh this may need to be cleaned out and away. Fortunately I do not see any signs of systemic infection though locally there is some evidence of infection she has been on Augmentin which just recently switched to doxycycline no cultures been performed at this point. From the standpoint of past medical history the patient does have a history of anemia which they believe is related to B12. She does not have diabetes she does have a history of lupus however. She is a former smoker. Patient History Information obtained from Patient. Allergies Sulfa (Sulfonamide Antibiotics), beef derived (bovine), lamb, milk Social History Former smoker, Marital Status - Widowed, Alcohol Use - Moderate, Drug Use - No History, Caffeine Use - Daily. Medical History Hematologic/Lymphatic Patient has history of Anemia Cardiovascular Patient has history of Hypertension - medication Endocrine Denies history of Type II Diabetes Immunological Patient has history of Lupus  Erythematosus Musculoskeletal Patient has history of Osteoarthritis - fingers Oncologic Patient has history of Received Radiation - Radioactive seeds Medical A Surgical History Notes nd Genitourinary Decreased function monitored annually Review of Systems (ROS) Constitutional Symptoms (General Health) Complains or has symptoms of Fever - One day this week. Respiratory Denies complaints or symptoms of Chronic or frequent coughs, Shortness of Breath. Gastrointestinal Complains or has symptoms of Nausea - antibiotics. Integumentary (Skin) Complains or has symptoms of Wounds. Neurologic Denies complaints or symptoms of Numbness/parasthesias, Focal/Weakness. Oncologic Breast Objective Constitutional patient is hypertensive.. pulse regular and within target range for patient.Marland Kitchen respirations regular, non-labored and within target range for patient.Marland Kitchen temperature within target range for patient.. Obese and well-hydrated in no acute distress. Vitals Time Taken: 8:01 AM, Height: 66 in, Source: Measured, Weight: 188 lbs, Source: Measured, BMI: 30.3, Temperature: 98.1 F, Pulse: 81 bpm, Respiratory Rate: 16 breaths/min, Blood Pressure: 149/80 mmHg. Eyes conjunctiva clear no eyelid edema noted. pupils equal round and reactive to light and accommodation. Ears, Nose, Mouth, and Throat no gross abnormality of ear auricles or external auditory canals. normal hearing noted during conversation. mucus membranes moist. Respiratory normal breathing without difficulty. Cardiovascular 2+ dorsalis pedis/posterior tibialis pulses. no clubbing, cyanosis, significant edema, Musculoskeletal normal gait and posture. no significant deformity or arthritic changes, no loss or range of motion, no clubbing. Psychiatric this patient is able to make decisions and demonstrates good insight into disease process. Alert and Oriented x 3. pleasant and cooperative. Integumentary (Hair, Skin) Wound #1 status is Open.  Original cause of wound was Trauma. The date acquired was: 08/11/2022. The wound is located on the Left,Proximal Upper Leg. The [name], [name] ([id number]) [id number] Page 8 of 12 wound measures 4cm length x 5cm width x 0.2cm depth; 15.708cm^2 area and 3.142cm^3 volume. There is Fat Layer (Subcutaneous Tissue) exposed. There is no tunneling or undermining noted. There is a medium amount of serous drainage noted. There is no granulation within the wound bed. There is a large (67-100%) amount of necrotic tissue within the wound bed including Adherent Slough. Wound #2 status is Open. Original cause of wound was Trauma. The date acquired was: 08/11/2022. The wound is located on the Left,Distal Upper Leg. The wound measures 14cm length x 9cm width x 5cm depth; 98.96cm^2 area and 494.801cm^3 volume. There is muscle and Fat Layer (Subcutaneous Tissue) exposed. There is undermining starting at 12:00 and ending at 12:00 with a maximum distance of 9cm. There is a large amount of serosanguineous drainage noted. The wound margin is well defined and not attached to the wound base. There is no granulation within the wound  bed. There is a large (67-100%) amount of necrotic tissue within the wound bed including Eschar and Necrosis of Muscle. Wound #3 status is Open. Original cause of wound was Trauma. The date acquired was: 08/11/2022. The wound is located on the Right Knee. The wound measures 3cm length x 5cm width x 1.7cm depth; 11.781cm^2 area and 20.028cm^3 volume. There is Fat Layer (Subcutaneous Tissue) exposed. There is no tunneling or undermining noted. There is a large amount of serosanguineous drainage noted. There is no granulation within the wound bed. There is a large (67- 100%) amount of necrotic tissue within the wound bed including Eschar. Upon evaluation patient had significant wounds both at the knee as well as the left thigh. There was obvious signs of there was  necrotic tissue noted on the surface of the left thigh it was difficult to tell how deep this was with IV and [name] explained this to the patient today prior to procedure as well. Subsequently we then elected to proceed with debridement to clearway the necrotic debris as best we could. The patient voiced understanding with knowing that it could be much deeper than what she realized. [name] first worked on the right knee where [name] was able to clearway once removed and the 5 sutures the necrotic debris this is more open than what we would like to see but nonetheless I do believe it does have a chance to actually heal and do much better based on what we are seeing currently. With regard to the healing in general [name] think that Dakin's moistened gauze is probably to be good here. [name] then directed our attention to the wound on the left thigh region. [name] did initially remove the 5 sutures that were in place. This was within tissue that was obviously dying and necrotic. Subsequently once [name] remove this and actually opened up into what appeared to be a fairly deep cavity underneath. We then carefully using a scalpel initially remove the necrotic tissue around the border of the wound and in the proximal region of the wound [name] did remove some tissue that [name] was unsure as to whether or not it was actually something that would survive or not this may end up opening but again we will have to keep a close eye on things. [name] then proceeded to perform debridement with the scalpel to remove necrotic debris on the inside [name] then also used a #5 curette to remove as much of the blood clot and necrotic tissue which was primarily subcutaneous tissue initially in the wound. Following this the patient then did have a area which [name] evacuated a significant amount of serosanguineous fluid. Once this was complete [name] then directed my attention to the more proximal portion of the wound where there was obviously still some fluctuance and some necrotic  tissue. [name] started to remove some of the clotted blood as well as the necrotic tissue from this region and once [name] using the scalpel was able to get the first bit of this cleared away she then had a flush of serosanguineous fluid from the proximal portion of the thigh that flooded into the open wound at that point. [name] was able to absorb that and collect that as well as express the remainder from the thigh region. [name] then actually attempted to clean out with gauze as well this region and she tolerated that with some minimal discomfort. Again this was an area that [name] was not even aware would have any fluid buildup but it was  a significant seroma that had collected along with some blood clotting up in this area as well. [name] really do feel like that this may be something that she struck and it was not just a bite although again a crushing injury from the dog bite with significant bleeding could account as well for what we are seeing. Either way in the end we were able to clean away a majority of the fluid and necrotic tissue noted in general. Nonetheless she still has a long ways to go before we get to any semblance of good healthy tissue here. I do believe Dakin's moistened gauze packing this could be the way to go and [name] did tell her that she is going to have quite a bit of drainage that she is probably can note over the weekend. My plan is to call and check on her as well over the weekend to see where things stand she was appreciative of this as well and [name] will document that in the communication notes. 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 There was a Excisional Skin/Subcutaneous Tissue/Muscle Debridement with a total area of 126 sq cm performed by [name] Sams., PA-C. With the following instrument(s): Blade, Curette, Forceps, and Scissors to remove Viable and Non-Viable tissue/material. Material removed includes Blood Clots, Fat, Muscle, Eschar, Subcutaneous Tissue, Slough, Skin: Dermis, Skin: Epidermis, and Other: removed 5 stiches after achieving pain control using Lidocaine 4% T opical Solution. 1 specimen was taken by a Swab and sent to the lab per facility protocol. A time out was conducted at 09:05, prior to the start of the procedure. A Moderate amount of bleeding was controlled with Silver Nitrate. The procedure was tolerated well with a pain level of 2 throughout and a pain level of 0 following the procedure. Post Debridement Measurements: 14cm length x 9cm width x 5cm depth; 494.801cm^3 volume. Character of Wound/Ulcer Post Debridement requires further debridement. Post procedure Diagnosis Wound #2: Same as Pre-Procedure General Notes: This was an extensive in office debridement which took about 25 minutes in total. Subsequently the patient did have a wound initially that appeared to be more superficial once the sutures were removed this actually went much more deep than what was initially expected. In fact she had 2 seroma pockets one underlying the immediate portion of the wound and then the other was actually further up in the thigh proximally. [name] would estimate that this was somewhere around 20 ounces of fluid which was collected and what seems to be almost somewhat of a seroma. It was not necessarily purulent but did have somewhat of an oily appearance. Nonetheless [name] did clean out as much of this as [name] could expressing from the upper thigh  region. We then following the significant debridement achieved hemostasis of any areas that were bleeding in order to prevent ongoing bleeding as she progresses through the weekend. The patient was appreciative of this and tolerated the procedure very well with only minimal discomfort at times. In fact it was mainly in the proximal  area of the wound where [name] was clearing out the seroma where she noticed most of her discomfort.. Wound #3 Pre-procedure diagnosis of Wound #3 is a Trauma, Other located on the Right Knee . There was a Excisional Skin/Subcutaneous Tissue Debridement with a total area of 15 sq cm performed by [name] Sams., PA-C. With the following instrument(s): Blade, Curette, Forceps, and Scissors to remove Viable and Non- Viable tissue/material. Material removed includes Fat, Eschar, Subcutaneous Tissue, Slough, and Other: removed 5 stiches. No specimens were taken. A time out was conducted at 09:00, prior to the start of the procedure. A Minimum amount of bleeding was controlled with Pressure. The procedure was tolerated well. Post Debridement Measurements: 3cm length x 5cm width x 1.7cm depth; 20.028cm^3 volume. Character of Wound/Ulcer Post Debridement requires further debridement. [name], [name] ([id number]) [id number] Page 9 of 12 Post procedure Diagnosis Wound #3: Same as Pre-Procedure Plan Follow-up Appointments: Return Appointment in 1 week. Anesthetic (Use 'Patient Medications' Section for Anesthetic Order Entry): Lidocaine applied to wound bed Edema Control - Lymphedema / Segmental Compressive Device / Other: Elevate, Exercise Daily and Avoid Standing for Long Periods of Time. Elevate legs to the level of the heart and pump ankles as often as possible Elevate leg(s) parallel to the floor when sitting. Laboratory ordered were: Wound culture routine left leg - wound red, swollen, foul odor WOUND #1: - Upper Leg Wound Laterality: Left,  Proximal Prim Dressing: Xeroform 4x4-HBD (in/in) 1 x Per Day/30 Days ary Discharge Instructions: Apply Xeroform 4x4-HBD (in/in) as directed Secondary Dressing: Gauze 1 x Per Day/30 Days Discharge Instructions: As directed: dry, moistened with saline or moistened with Dakins Solution Secured With: Medipore T - 4M Medipore H Soft Cloth Surgical T ape ape, 2x2 (in/yd) 1 x Per Day/30 Days WOUND #2: - Upper Leg Wound Laterality: Left, Distal Prim Dressing: Gauze 1 x Per Day/30 Days ary Discharge Instructions: moistened with Dakins Solution Secondary Dressing: ABD Pad 5x9 (in/in) 1 x Per Day/30 Days Discharge Instructions: Cover with ABD pad Secondary Dressing: Gauze 1 x Per Day/30 Days Discharge Instructions: As directed: dry, moistened with saline or moistened with Dakins Solution Secured With: Medipore T - 4M Medipore H Soft Cloth Surgical T ape ape, 2x2 (in/yd) 1 x Per Day/30 Days WOUND #3: - Knee Wound Laterality: Right Prim Dressing: Gauze 1 x Per Day/30 Days ary Discharge Instructions: moistened with Dakins Solution Secondary Dressing: (BORDER) Zetuvit Plus SILICONE BORDER Dressing 5x5 (in/in) 1 x Per Day/30 Days Discharge Instructions: Please do not put silicone bordered dressings under wraps. Use non-bordered dressing only. 1. Based on what [name] see currently I do believe that Dakin's moistened gauze packing for both the knee as well as the thigh are good to be the way to go. As soon as we can get this clean enough for the thigh I do believe that she is going to need a wound VAC as soon as possible to get this movement of the right direction but were definitely not there yet. 2. [name] am also going to recommend the patient should continue to take her doxycycline [name] did obtain a wound culture from the deepest part of this thigh wound in order to check for organisms present and we will make any adjustments in therapy as far as antibiotics are concerned as soon as we get the results of  that back. 3. [name] am also going to suggest that she should monitor for the fact that she is can have a lot more drainage after  we had this opened and cleaned out. Obviously [name] think that this is going to be over the weekend especially and I do plan to call and check on her tomorrow to see where things stand even though it is a Saturday. This is one patient that [name] have a little bit of concern for as far as things getting worse and [name] want her to be able to ask any questions if she needs to. 4. Based on the overall appearance currently [name] think that there may be some tissue in the proximal portion of the left thigh wound that may breakdown but [name] am hopeful not we will see how things progress over the next week. Because this is a very touchy go situation [name] am going to have her come in daily for dressing changes nurse visits and then she will come in on Friday for her provider visit this will be with Dr. Dellia Nims next week he can have a look and see where things stand at that point. Nonetheless [name] did want to keep a close eye on her due to the significance of this wound. We will see patient back for reevaluation in 1 week here in the clinic. If anything worsens or changes patient will contact our office for additional recommendations. Of note the patient did call back and asked me today as to whether or not she can put ice packs on the area. My recommendation was to have her not so as not to make any of the tissue be more vascularly compromised I do not want her to lose any superficial tissue that is not necessary. She voiced understanding the good news that she is not having a lot of pain despite the significance of this wound. Electronic Signature(s) Signed: 08/24/2022 1:53:49 PM By: [name] PA-C Previous Signature: 08/24/2022 1:51:56 PM Version By: [name] PA-C Previous Signature: 08/24/2022 1:51:22 PM Version By: [name] PA-C Entered By: [name] on 08/24/2022 13:53:49 [name], [name]  [name] ([id number]) [id number] Page 10 of 12 -------------------------------------------------------------------------------- ROS/PFSH Details Patient Name: Date of Service: Kristine Mater [name]. 08/24/2022 7:45 A M Medical Record Number: [id number] Patient Account Number: [id number] Date of Birth/Sex: Treating RN: 11/24/1955 (66 y.o. [name] Primary Care Provider: Jon Billings Other Clinician: Referring Provider: Treating Provider/Extender: Venia Minks in Treatment: 0 Information Obtained From Patient Constitutional Symptoms ([place]) Complaints and Symptoms: Positive for: Fever - One day this week Respiratory Complaints and Symptoms: Negative for: Chronic or frequent coughs; Shortness of Breath Gastrointestinal Complaints and Symptoms: Positive for: Nausea - antibiotics Integumentary (Skin) Complaints and Symptoms: Positive for: Wounds Neurologic Complaints and Symptoms: Negative for: Numbness/parasthesias; Focal/Weakness Hematologic/Lymphatic Medical History: Positive for: Anemia Cardiovascular Medical History: Positive for: Hypertension - medication Endocrine Medical History: Negative for: Type II Diabetes Genitourinary Medical History: Past Medical History Notes: Decreased function monitored annually Immunological Medical History: Positive for: Lupus Erythematosus Musculoskeletal Medical History: Positive for: Osteoarthritis - fingers Oncologic Complaints and Symptoms: Review of System Notes: Breast Medical History: Positive for: Received Radiation - Radioactive seeds [name], [name] ([id number]) [id number] Page 11 of 12 Immunizations Pneumococcal Vaccine: Received Pneumococcal Vaccination: No Implantable Devices None Family and Social History Former smoker; Marital Status - Widowed; Alcohol Use: Moderate; Drug Use: No History; Caffeine Use:  Daily Electronic Signature(s) Signed: 08/24/2022 1:54:42 PM By: [name] PA-C Signed: 08/26/2022 5:57:01 PM By: Gretta Cool, BSN, RN, CWS, [name] RN, BSN Entered By: Gretta Cool, BSN, RN, CWS, [name] on 08/24/2022 08:07:59 --------------------------------------------------------------------------------  SuperBill Details Patient Name: Date of Service: [name] 08/24/2022 Medical Record Number: [id number] Patient Account Number: [id number] Date of Birth/Sex: Treating RN: [birth year] (66 y.o. 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.3 Other dietary vitamin B12 deficiency anemia M32.8 Other forms of systemic lupus erythematosus Facility Procedures : [place] Code: [id number] Description: 56314 - WOUND CARE VISIT-LEV 3 EST PT Modifier: Quantity: 1 : CPT4 Code: [id number] Description: 58850 - DEB SUBQ TISSUE 20 SQ CM/< ICD-10 Diagnosis Description L98.492 Non-pressure chronic ulcer of skin of other sites with fat layer exposed Modifier: Quantity: 1 : CPT4 Code: [id number] Description: 86767 - DEB MUSC/FASCIA 20 SQ CM/< ICD-10 Diagnosis Description L97.823 Non-pressure chronic ulcer of other part of left lower leg with necrosis of mus Modifier: cle Quantity: 1 : CPT4 Code: [id number] Description: 11046 - DEB MUSC/FASCIA EA ADDL 20 CM ICD-10 Diagnosis Description L97.823 Non-pressure chronic ulcer of other part of  left lower leg with necrosis of mus Modifier: cle Quantity: 6 Physician Procedures ELLENE, BLOODSAW [name] ([id number]): CPT4 Code Description [id number] [phone] - WC PHYS LEVEL 4 - NEW PT ICD-10 Diagnosis Description W54.0XXA Bitten by dog, initial encounter S80.12XA Contusion of left lower leg, initial encounter T79.2XXA Traumatic secondary  and recurrent hemorrhage and seroma, initial C12.751Z Abrasion, left lower leg, initial encounter [id number] Page 12 of 12: Quantity Modifier [address] GOLDYE, TOURANGEAU [name] ([id number]): [id number] 11042 - WC PHYS SUBQ TISS 20 SQ CM ICD-10 Diagnosis Description L98.492 Non-pressure chronic ulcer of skin of other sites with fat layer [id number] Page 12 of 12: 1 exposed FAITHLYN, RECKTENWALD [name] ([id number]): [id number] 90300 - WC PHYS DEBR MUSCLE/FASCIA 20 SQ CM ICD-10 Diagnosis Description L97.823 Non-pressure chronic ulcer of other part of left lower leg with [id number] Page 12 of 12: 1 necrosis of muscle OLUWATOYIN, BANALES [name] ([id number]): [id number] 11046 - WC PHYS DEB MUSC/FASC EA ADDL 20 CM ICD-10 Diagnosis Description L97.823 Non-pressure chronic ulcer of other part of left lower leg with [id number] Page 12 of 12: 6 necrosis of muscle Electronic Signature(s) Signed: 08/24/2022 1:54:07 PM By: [name] PA-C Previous Signature: 08/24/2022 1:52:55 PM Version By: [name] PA-C Previous Signature: 08/24/2022 12:34:54 PM Version By: Carlene Coria RN Entered By: [name] on 08/24/2022 13:54:07

## 2022-08-27 NOTE — Progress Notes (Addendum)
[name], [name] ([id number]) [id number] Page 1 of 7 Visit Report for 08/27/2022 Arrival Information Details Patient Name: Date of Service: [name]. 08/27/2022 8:00 A M Medical Record Number: [id number] Patient Account Number: [id number] Date of Birth/Sex: Treating RN: January 27, 1956 (66 y.o. [name] Primary Care Rebeca Valdivia: Jon Billings Other Clinician: Referring Shloka Baldridge: Treating Ravleen Ries/Extender: RO BSO N, MICHA EL Juanell Fairly in Treatment: 0 Visit Information History Since Last Visit Added or deleted any medications: No Patient Arrived: Ambulatory Any new allergies or adverse reactions: No Arrival Time: 08:29 Hospitalized since last visit: No Accompanied By: self Pain Present Now: Yes Transfer Assistance: None Patient Identification Verified: Yes Secondary Verification Process Completed: Yes Patient Requires Transmission-Based Precautions: No Patient Has Alerts: Yes Patient Alerts: NOT Diabetic Electronic Signature(s) Signed: 08/27/2022 4:00:16 PM By: Rosalio Loud MSN RN CNS WTA Previous Signature: 08/27/2022 2:21:51 PM Version By: Rosalio Loud MSN RN CNS WTA Entered By: Rosalio Loud on 08/27/2022 16:00:16 -------------------------------------------------------------------------------- Clinic Level of Care Assessment Details Patient Name: Date of Service: [name]. 08/27/2022 8:00 A M Medical Record Number: [id number] Patient Account Number: [id number] Date of Birth/Sex: Treating RN: [birth year] (66 y.o. [name] Primary Care Relda Agosto: Jon Billings Other Clinician: Referring Sameer Teeple: Treating Waller Marcussen/Extender: RO BSO N, MICHA EL Juanell Fairly in Treatment: 0 Clinic Level of Care Assessment Items TOOL 4 Quantity Score X- 1 0 Use when only an EandM is performed on FOLLOW-UP visit ASSESSMENTS - Nursing Assessment / Reassessment X- 1 10 Reassessment of Co-morbidities  (includes updates in patient status) X- 1 5 Reassessment of Adherence to Treatment Plan ASSESSMENTS - Wound and Skin A ssessment / Reassessment '[]'$  - 0 Simple Wound Assessment / Reassessment - one wound [name], [name] ([id number]) [id number] Page 2 of 7 X- 3 5 Complex Wound Assessment / Reassessment - multiple wounds '[]'$  - 0 Dermatologic / Skin Assessment (not related to wound area) ASSESSMENTS - Focused Assessment '[]'$  - 0 Circumferential Edema Measurements - multi extremities '[]'$  - 0 Nutritional Assessment / Counseling / Intervention '[]'$  - 0 Lower Extremity Assessment (monofilament, tuning fork, pulses) '[]'$  - 0 Peripheral Arterial Disease Assessment (using hand held doppler) ASSESSMENTS - Ostomy and/or Continence Assessment and Care '[]'$  - 0 Incontinence Assessment and Management '[]'$  - 0 Ostomy Care Assessment and Management (repouching, etc.) PROCESS - Coordination of Care '[]'$  - 0 Simple Patient / Family Education for ongoing care '[]'$  - 0 Complex (extensive) Patient / Family Education for ongoing care X- 1 10 Staff obtains Programmer, systems, Records, T Results / Process Orders est '[]'$  - 0 Staff telephones HHA, Nursing Homes / Clarify orders / etc '[]'$  - 0 Routine Transfer to another Facility (non-emergent condition) '[]'$  - 0 Routine Hospital Admission (non-emergent condition) '[]'$  - 0 New Admissions / Biomedical engineer / Ordering NPWT Apligraf, etc. , '[]'$  - 0 Emergency Hospital Admission (emergent condition) '[]'$  - 0 Simple Discharge Coordination X- 1 15 Complex (extensive) Discharge Coordination PROCESS - Special Needs '[]'$  - 0 Pediatric / Minor Patient Management '[]'$  - 0 Isolation Patient Management '[]'$  - 0 Hearing / Language / Visual special needs '[]'$  - 0 Assessment of Community assistance (transportation, D/C planning, etc.) '[]'$  - 0 Additional assistance / Altered mentation '[]'$  - 0 Support Surface(s) Assessment (bed, cushion, seat,  etc.) INTERVENTIONS - Wound Cleansing / Measurement '[]'$  - 0 Simple Wound Cleansing - one wound X- 3 5 Complex Wound Cleansing - multiple wounds X- 1 5 Wound Imaging (photographs - any number of wounds) '[]'$  -  0 Wound Tracing (instead of photographs) '[]'$  - 0 Simple Wound Measurement - one wound X- 3 5 Complex Wound Measurement - multiple wounds INTERVENTIONS - Wound Dressings '[]'$  - 0 Small Wound Dressing one or multiple wounds X- 3 15 Medium Wound Dressing one or multiple wounds '[]'$  - 0 Large Wound Dressing one or multiple wounds '[]'$  - 0 Application of Medications - topical '[]'$  - 0 Application of Medications - injection INTERVENTIONS - Miscellaneous '[]'$  - 0 External ear exam '[]'$  - 0 Specimen Collection (cultures, biopsies, blood, body fluids, etc.) '[]'$  - 0 Specimen(s) / Culture(s) sent or taken to Lab for analysis [name], [name] ([id number]) [id number] Page 3 of 7 '[]'$  - 0 Patient Transfer (multiple staff / Civil Service fast streamer / Similar devices) '[]'$  - 0 Simple Staple / Suture removal (25 or less) '[]'$  - 0 Complex Staple / Suture removal (26 or more) '[]'$  - 0 Hypo / Hyperglycemic Management (close monitor of Blood Glucose) '[]'$  - 0 Ankle / Brachial Index (ABI) - do not check if billed separately '[]'$  - 0 Vital Signs Has the patient been seen at the hospital within the last three years: Yes Total Score: 135 Level Of Care: New/Established - Level 4 Electronic Signature(s) Signed: 08/27/2022 4:25:10 PM By: Rosalio Loud MSN RN CNS WTA Entered By: Rosalio Loud on 08/27/2022 16:18:26 -------------------------------------------------------------------------------- Encounter Discharge Information Details Patient Name: Date of Service: [name]. 08/27/2022 8:00 A M Medical Record Number: [id number] Patient Account Number: [id number] Date of Birth/Sex: Treating RN: 07-Apr-1956 (66 y.o. [name] Primary Care Hanya Guerin: Jon Billings Other  Clinician: Referring Ciena Sampley: Treating Brightyn Mozer/Extender: RO BSO N, MICHA EL Blanche [place], Areatha Keas in Treatment: 0 Encounter Discharge Information Items Discharge Condition: Stable Ambulatory Status: Ambulatory Discharge Destination: Home Transportation: Private Auto Accompanied By: self Schedule Follow-up Appointment: No Clinical Summary of Care: Electronic Signature(s) Signed: 08/27/2022 2:21:51 PM By: Rosalio Loud MSN RN CNS WTA Entered By: Rosalio Loud on 08/27/2022 08:35:28 -------------------------------------------------------------------------------- Wound Assessment Details Patient Name: Date of Service: [name]. 08/27/2022 8:00 A M Medical Record Number: [id number] Patient Account Number: [id number] Date of Birth/Sex: Treating RN: Oct 27, 1955 (66 y.o. [name] Primary Care Kristy Catoe: Jon Billings Other Clinician: TAMYIA, [name] ([id number]) [id number] Page 4 of 7 Referring Hadeel Hillebrand: Treating Joyel Chenette/Extender: RO BSO N, MICHA EL Blanche [place], [name] Weeks in Treatment: 0 Wound Status Wound Number: 1 Primary Trauma, Other Etiology: Wound Location: Left, Proximal Upper Leg Wound Open Wounding Event: Trauma Status: Date Acquired: 08/11/2022 Comorbid Anemia, Hypertension, Lupus Erythematosus, Osteoarthritis, Weeks Of Treatment: 0 History: Received Radiation Clustered Wound: Yes Photos Wound Measurements Length: (cm) 4 Width: (cm) 5 Depth: (cm) 0.2 Clustered Quantity: 3 Area: (cm) 15.708 Volume: (cm) 3.142 % Reduction in Area: 0% % Reduction in Volume: 0% Epithelialization: None Wound Description Classification: Full Thickness Without Exposed Sup Exudate Amount: Medium Exudate Type: Serous Exudate Color: amber port Structures Foul Odor After Cleansing: No Slough/Fibrino Yes Wound Bed Granulation Amount: None Present (0%) Exposed Structure Necrotic Amount: Large (67-100%) Fascia Exposed:  No Necrotic Quality: Adherent Slough Fat Layer (Subcutaneous Tissue) Exposed: Yes Tendon Exposed: No Muscle Exposed: No Joint Exposed: No Bone Exposed: No Treatment Notes Wound #1 (Upper Leg) Wound Laterality: Left, Proximal Cleanser Peri-Wound Care Topical Primary Dressing Xeroform 4x4-HBD (in/in) Discharge Instruction: Apply Xeroform 4x4-HBD (in/in) as directed Secondary Dressing Gauze Discharge Instruction: As directed: dry, moistened with saline or moistened with Dakins Solution Secured With Medipore T - 59M Medipore H Soft Cloth Surgical T ape ape,  2x2 (in/yd) Compression Wrap Compression Stockings Add-Ons [name], [name] ([id number]) [id number] Page 5 of 7 Electronic Signature(s) Signed: 08/27/2022 2:21:51 PM By: Rosalio Loud MSN RN CNS WTA Entered By: Rosalio Loud on 08/27/2022 08:33:15 -------------------------------------------------------------------------------- Wound Assessment Details Patient Name: Date of Service: [name]. 08/27/2022 8:00 A M Medical Record Number: [id number] Patient Account Number: [id number] Date of Birth/Sex: Treating RN: 11/13/1955 (66 y.o. [name] Primary Care Bartlomiej Jenkinson: Jon Billings Other Clinician: Referring Arsalan Brisbin: Treating Dareion Kneece/Extender: RO BSO N, MICHA EL Blanche [place], Areatha Keas in Treatment: 0 Wound Status Wound Number: 2 Primary Trauma, Other Etiology: Wound Location: Left, Distal Upper Leg Wound Open Wounding Event: Trauma Status: Date Acquired: 08/11/2022 Comorbid Anemia, Hypertension, Lupus Erythematosus, Osteoarthritis, Weeks Of Treatment: 0 History: Received Radiation Clustered Wound: No Photos Wound Measurements Length: (cm) 11.5 Width: (cm) 10.8 Depth: (cm) 0.8 Area: (cm) 97.546 Volume: (cm) 78.037 % Reduction in Area: 1.4% % Reduction in Volume: 84.2% Undermining: Yes Starting Position (o'clock): 12 Ending Position (o'clock): 1 Maximum  Distance: (cm) 8 Wound Description Classification: Full Thickness With Exposed Suppor Wound Margin: Well defined, not attached Exudate Amount: Large Exudate Type: Serosanguineous Exudate Color: red, brown t Structures Foul Odor After Cleansing: No Slough/Fibrino No Wound Bed Granulation Amount: None Present (0%) Exposed Structure Necrotic Amount: Large (67-100%) Fascia Exposed: No Necrotic Quality: Eschar Fat Layer (Subcutaneous Tissue) Exposed: Yes Tendon Exposed: No Muscle Exposed: Yes Necrosis of Muscle: Yes Joint Exposed: No Bone Exposed: No [name], [name] ([id number]) [id number] Page 6 of 7 Treatment Notes Wound #2 (Upper Leg) Wound Laterality: Left, Distal Cleanser Peri-Wound Care Topical Primary Dressing Gauze Discharge Instruction: moistened with Dakins Solution Secondary Dressing ABD Pad 5x9 (in/in) Discharge Instruction: Cover with ABD pad Gauze Discharge Instruction: As directed: dry, moistened with saline or moistened with Dakins Solution Secured With Medipore T - 20M Medipore H Soft Cloth Surgical T ape ape, 2x2 (in/yd) Compression Wrap Compression Stockings Add-Ons Electronic Signature(s) Signed: 08/27/2022 2:21:51 PM By: Rosalio Loud MSN RN CNS WTA Entered By: Rosalio Loud on 08/27/2022 08:34:10 -------------------------------------------------------------------------------- Wound Assessment Details Patient Name: Date of Service: [name]. 08/27/2022 8:00 A M Medical Record Number: [id number] Patient Account Number: [id number] Date of Birth/Sex: Treating RN: Jun 04, 1956 (66 y.o. [name] Primary Care Quantae Martel: Jon Billings Other Clinician: Referring Seddrick Flax: Treating Parke Jandreau/Extender: RO BSO N, MICHA EL Juanell Fairly in Treatment: 0 Wound Status Wound Number: 3 Primary Trauma, Other Etiology: Wound Location: Right Knee Wound Open Wounding Event: Trauma Status: Date Acquired:  08/11/2022 Comorbid Anemia, Hypertension, Lupus Erythematosus, Osteoarthritis, Weeks Of Treatment: 0 History: Received Radiation Clustered Wound: No Photos [name], [name] ([id number]) [id number] Page 7 of 7 Wound Measurements Length: (cm) 3.5 Width: (cm) 6 Depth: (cm) 0.5 Area: (cm) 16.493 Volume: (cm) 8.247 % Reduction in Area: -40% % Reduction in Volume: 58.8% Epithelialization: None Wound Description Classification: Full Thickness Without Exposed Support Exudate Amount: Large Exudate Type: Serosanguineous Exudate Color: red, brown Structures Foul Odor After Cleansing: No Slough/Fibrino Yes Wound Bed Granulation Amount: None Present (0%) Exposed Structure Necrotic Amount: Large (67-100%) Fascia Exposed: No Necrotic Quality: Eschar Fat Layer (Subcutaneous Tissue) Exposed: Yes Tendon Exposed: No Muscle Exposed: No Joint Exposed: No Bone Exposed: No Treatment Notes Wound #3 (Knee) Wound Laterality: Right Cleanser Peri-Wound Care Topical Primary Dressing Gauze Discharge Instruction: moistened with Dakins Solution Secondary Dressing (BORDER) Zetuvit Plus SILICONE BORDER Dressing 5x5 (in/in) Discharge Instruction: Please do not put silicone bordered dressings under wraps. Use non-bordered dressing only. Secured  With Compression Wrap Compression Stockings Add-Ons Electronic Signature(s) Signed: 08/27/2022 2:21:51 PM By: Rosalio Loud MSN RN CNS WTA Entered By: Rosalio Loud on 08/27/2022 08:34:40

## 2022-08-27 NOTE — Telephone Encounter (Signed)
Patient will check to see what strength and let us know

## 2022-08-27 NOTE — Telephone Encounter (Signed)
Yes, she can take Zenpep 2 capsules with each meal and 1 with snack Can you please find out what dose of Zenpep she has at home?  RV

## 2022-08-27 NOTE — Telephone Encounter (Signed)
Patient is calling because her sister was taking Zenpep for her pancreatic insufficiency but her doctor took her off the medication. The sister has a lot of the medication left and they called the patient to see if she wanted the medication. Patient wants to know if she can take this medication so the medication does not go to waste

## 2022-08-27 NOTE — Progress Notes (Signed)
ZENAIDA, TESAR [name] ([id number]) [id number] Page 1 of 2 Visit Report for 08/27/2022 Physician Orders Details Patient Name: Date of Service: [name]. 08/27/2022 8:00 A M Medical Record Number: [id number] Patient Account Number: [id number] Date of Birth/Sex: Treating RN: 1956/04/12 (66 y.o. [name] Primary Care Provider: Jon Billings Other Clinician: Referring Provider: Treating Provider/Extender: RO BSO N, [place], Areatha Keas in Treatment: 0 Verbal / Phone Orders: No Diagnosis Coding Follow-up Appointments Return Appointment in 1 week. Anesthetic (Use 'Patient Medications' Section for Anesthetic Order Entry) Lidocaine applied to wound bed Edema Control - Lymphedema / Segmental Compressive Device / Other Elevate, Exercise Daily and A void Standing for Long Periods of Time. Elevate legs to the level of the heart and pump ankles as often as possible Elevate leg(s) parallel to the floor when sitting. Wound Treatment Wound #1 - Upper Leg Wound Laterality: Left, Proximal Prim Dressing: Xeroform 4x4-HBD (in/in) 1 x Per Day/30 Days ary Discharge Instructions: Apply Xeroform 4x4-HBD (in/in) as directed Secondary Dressing: Gauze 1 x Per Day/30 Days Discharge Instructions: As directed: dry, moistened with saline or moistened with Dakins Solution Secured With: Medipore T - 59M Medipore H Soft Cloth Surgical T ape ape, 2x2 (in/yd) 1 x Per Day/30 Days Wound #2 - Upper Leg Wound Laterality: Left, Distal Prim Dressing: Gauze 1 x Per Day/30 Days ary Discharge Instructions: moistened with Dakins Solution Secondary Dressing: ABD Pad 5x9 (in/in) 1 x Per Day/30 Days Discharge Instructions: Cover with ABD pad Secondary Dressing: Gauze 1 x Per Day/30 Days Discharge Instructions: As directed: dry, moistened with saline or moistened with Dakins Solution Secured With: Medipore T - 59M Medipore H Soft Cloth Surgical T ape ape, 2x2 (in/yd) 1  x Per Day/30 Days Wound #3 - Knee Wound Laterality: Right Prim Dressing: Gauze 1 x Per Day/30 Days ary Discharge Instructions: moistened with Dakins Solution Secondary Dressing: (BORDER) Zetuvit Plus SILICONE BORDER Dressing 5x5 (in/in) 1 x Per Day/30 Days Discharge Instructions: Please do not put silicone bordered dressings under wraps. Use non-bordered dressing only. Electronic Signature(s) Signed: 08/27/2022 2:21:51 PM By: Rosalio Loud MSN RN CNS WTA Signed: 08/27/2022 4:14:14 PM By: Linton Ham MD Entered By: Rosalio Loud on 08/27/2022 08:35:04 [name] ([id number]) [id number] Page 2 of 2 -------------------------------------------------------------------------------- SuperBill Details Patient Name: Date of Service: [name] 08/27/2022 Medical Record Number: [id number] Patient Account Number: [id number] Date of Birth/Sex: Treating RN: January 07, 1956 (66 y.o. 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

## 2022-08-28 ENCOUNTER — Encounter: Payer: Medicare Other | Admitting: Internal Medicine

## 2022-08-28 DIAGNOSIS — M328 Other forms of systemic lupus erythematosus: Secondary | ICD-10-CM | POA: Diagnosis not present

## 2022-08-28 DIAGNOSIS — L98493 Non-pressure chronic ulcer of skin of other sites with necrosis of muscle: Secondary | ICD-10-CM | POA: Diagnosis not present

## 2022-08-28 DIAGNOSIS — D513 Other dietary vitamin B12 deficiency anemia: Secondary | ICD-10-CM | POA: Diagnosis not present

## 2022-08-28 DIAGNOSIS — S71102A Unspecified open wound, left thigh, initial encounter: Secondary | ICD-10-CM | POA: Diagnosis not present

## 2022-08-28 DIAGNOSIS — S81001A Unspecified open wound, right knee, initial encounter: Secondary | ICD-10-CM | POA: Diagnosis not present

## 2022-08-28 DIAGNOSIS — L97812 Non-pressure chronic ulcer of other part of right lower leg with fat layer exposed: Secondary | ICD-10-CM | POA: Diagnosis not present

## 2022-08-29 ENCOUNTER — Telehealth: Payer: Self-pay

## 2022-08-29 DIAGNOSIS — L98493 Non-pressure chronic ulcer of skin of other sites with necrosis of muscle: Secondary | ICD-10-CM | POA: Diagnosis not present

## 2022-08-29 DIAGNOSIS — D513 Other dietary vitamin B12 deficiency anemia: Secondary | ICD-10-CM | POA: Diagnosis not present

## 2022-08-29 DIAGNOSIS — L97812 Non-pressure chronic ulcer of other part of right lower leg with fat layer exposed: Secondary | ICD-10-CM | POA: Diagnosis not present

## 2022-08-29 DIAGNOSIS — M328 Other forms of systemic lupus erythematosus: Secondary | ICD-10-CM | POA: Diagnosis not present

## 2022-08-29 NOTE — Progress Notes (Signed)
[name] [name] ([id number]) [id number] Page 1 of 13 Visit Report for 08/28/2022 Arrival Information Details Patient Name: Date of Service: [name] 08/28/2022 3:45 PM Medical Record Number: [id number] Patient Account Number: [id number] Date of Birth/Sex: Treating RN: August 04, 1956 (66 y.o. [name], [name] Primary Care [name]: [name] Other Clinician: Massie [name] Referring [name]: Treating [name]/Extender: [name], [name], [name] in Treatment: 0 Visit Information History Since Last Visit All ordered tests and consults were completed: No Patient Arrived: Ambulatory Added or deleted any medications: No Arrival Time: 15:56 Any new allergies or adverse reactions: No Transfer Assistance: None Had a fall or experienced change in No Patient Requires Transmission-Based Precautions: No activities of daily living that may affect Patient Has Alerts: Yes risk of falls: Patient Alerts: NOT Diabetic Signs or symptoms of abuse/neglect since last visito No Hospitalized since last visit: No Implantable device outside of the clinic excluding No cellular tissue based products placed in the [name] since last visit: Pain Present Now: Yes Electronic Signature(s) Signed: 08/28/2022 5:15:48 PM By: [name] Entered By: [name] on 08/28/2022 15:57:52 -------------------------------------------------------------------------------- Clinic Level of Care Assessment Details Patient Name: Date of Service: [name] 08/28/2022 3:45 PM Medical Record Number: [id number] Patient Account Number: [id number] Date of Birth/Sex: Treating RN: 09-04-1956 (66 y.o. [name] Primary Care [name]: [name] Other Clinician: Massie [name] Referring Zaccai Chavarin: Treating [name]/Extender: [name], [place], [name] in Treatment: 0 Clinic Level of Care Assessment Items TOOL 4 Quantity  Score '[]'$  - 0 Use when only an EandM is performed on FOLLOW-UP visit ASSESSMENTS - Nursing Assessment / Reassessment X- 1 10 Reassessment of Co-morbidities (includes updates in patient status) X- 1 5 Reassessment of Adherence to Treatment Plan [name] [name] ([id number]) [id number] Page 2 of 13 ASSESSMENTS - Wound and Skin A ssessment / Reassessment '[]'$  - Simple Wound Assessment / Reassessment - one wound 0 X- 3 5 Complex Wound Assessment / Reassessment - multiple wounds '[]'$  - 0 Dermatologic / Skin Assessment (not related to wound area) ASSESSMENTS - Focused Assessment '[]'$  - 0 Circumferential Edema Measurements - multi extremities '[]'$  - 0 Nutritional Assessment / Counseling / Intervention '[]'$  - 0 Lower Extremity Assessment (monofilament, tuning fork, pulses) '[]'$  - 0 Peripheral Arterial Disease Assessment (using hand held doppler) ASSESSMENTS - Ostomy and/or Continence Assessment and Care '[]'$  - 0 Incontinence Assessment and Management '[]'$  - 0 Ostomy Care Assessment and Management (repouching, etc.) PROCESS - Coordination of Care X - Simple Patient / Family Education for ongoing care 1 15 '[]'$  - 0 Complex (extensive) Patient / Family Education for ongoing care '[]'$  - 0 Staff obtains Programmer, systems, Records, T Results / Process Orders est '[]'$  - 0 Staff telephones HHA, Nursing Homes / Clarify orders / etc '[]'$  - 0 Routine Transfer to another Facility (non-emergent condition) '[]'$  - 0 Routine Hospital Admission (non-emergent condition) '[]'$  - 0 New Admissions / Biomedical engineer / Ordering NPWT Apligraf, etc. , '[]'$  - 0 Emergency Hospital Admission (emergent condition) X- 1 10 Simple Discharge Coordination '[]'$  - 0 Complex (extensive) Discharge Coordination PROCESS - Special Needs '[]'$  - 0 Pediatric / Minor Patient Management '[]'$  - 0 Isolation Patient Management '[]'$  - 0 Hearing / Language / Visual special needs '[]'$  - 0 Assessment of Community assistance  (transportation, D/C planning, etc.) '[]'$  - 0 Additional assistance / Altered mentation '[]'$  - 0 Support Surface(s) Assessment (bed, cushion, seat, etc.) INTERVENTIONS - Wound Cleansing / Measurement '[]'$  -  0 Simple Wound Cleansing - one wound X- 3 5 Complex Wound Cleansing - multiple wounds X- 1 5 Wound Imaging (photographs - any number of wounds) '[]'$  - 0 Wound Tracing (instead of photographs) '[]'$  - 0 Simple Wound Measurement - one wound X- 3 5 Complex Wound Measurement - multiple wounds INTERVENTIONS - Wound Dressings '[]'$  - 0 Small Wound Dressing one or multiple wounds X- 3 15 Medium Wound Dressing one or multiple wounds '[]'$  - 0 Large Wound Dressing one or multiple wounds '[]'$  - 0 Application of Medications - topical '[]'$  - 0 Application of Medications - injection INTERVENTIONS - Miscellaneous '[]'$  - 0 External ear exam [name] [name] ([id number]) [id number] Page 3 of 13 '[]'$  - 0 Specimen Collection (cultures, biopsies, blood, body fluids, etc.) '[]'$  - 0 Specimen(s) / Culture(s) sent or taken to Lab for analysis '[]'$  - 0 Patient Transfer (multiple staff / Harrel Lemon Lift / Similar devices) '[]'$  - 0 Simple Staple / Suture removal (25 or less) '[]'$  - 0 Complex Staple / Suture removal (26 or more) '[]'$  - 0 Hypo / Hyperglycemic Management (close monitor of Blood Glucose) '[]'$  - 0 Ankle / Brachial Index (ABI) - do not check if billed separately X- 1 5 Vital Signs Has the patient been seen at the hospital within the last three years: Yes Total Score: 140 Level Of Care: New/Established - Level 4 Electronic Signature(s) Signed: 08/28/2022 5:15:48 PM By: [name] Entered By: [name] on 08/28/2022 16:34:32 -------------------------------------------------------------------------------- Encounter Discharge Information Details Patient Name: Date of Service: [name]. 08/28/2022 3:45 PM Medical Record Number: [id number] Patient Account Number:  [id number] Date of Birth/Sex: Treating RN: August 17, 1956 (66 y.o. [name] Primary Care Zakiyah Diop: [name] Other Clinician: Massie [name] Referring Yudith Norlander: Treating Janaysia Mcleroy/Extender: [name], MICHA EL Blanche [place], [name] in Treatment: 0 Encounter Discharge Information Items Discharge Condition: Stable Ambulatory Status: Ambulatory Discharge Destination: Home Transportation: Private Auto Accompanied By: self Schedule Follow-up Appointment: Yes Clinical Summary of Care: Electronic Signature(s) Signed: 08/28/2022 5:15:48 PM By: [name] Entered By: [name] on 08/28/2022 16:55:27 -------------------------------------------------------------------------------- Lower Extremity Assessment Details Patient Name: Date of Service: [name]. 08/28/2022 3:45 PM [name] ([id number]) [id number] Page 4 of 13 Medical Record Number: [id number] Patient Account Number: [id number] Date of Birth/Sex: Treating RN: 05/02/1956 (66 y.o. [name] Primary Care Alison Breeding: [name] Other Clinician: Massie [name] Referring Monta Maiorana: Treating Deniro Laymon/Extender: [name], [place], [name] in Treatment: 0 Electronic Signature(s) Signed: 08/28/2022 5:15:48 PM By: [name] Signed: 08/28/2022 5:24:18 PM By: Gretta Cool, BSN, RN, CWS, [name] RN, BSN Entered By: [name] on 08/28/2022 16:20:50 -------------------------------------------------------------------------------- Multi Wound Chart Details Patient Name: Date of Service: [name]. 08/28/2022 3:45 PM Medical Record Number: [id number] Patient Account Number: [id number] Date of Birth/Sex: Treating RN: Sep 12, 1956 (66 y.o. [name] Primary Care Arlayne Liggins: [name] Other Clinician: Massie [name] Referring Dominque Marlin: Treating Clary Meeker/Extender: [name], [place], [name] in Treatment: 0 Vital  Signs Height(in): 19 Pulse(bpm): 30 Weight(lbs): 188 Blood Pressure(mmHg): 144/73 Body Mass Index(BMI): 30.3 Temperature(F): 98.4 Respiratory Rate(breaths/min): 18 [1:Photos:] Left, Proximal Upper Leg Left, Distal Upper Leg Right Knee Wound Location: Trauma Trauma Trauma Wounding Event: Trauma, Other Trauma, Other Trauma, Other Primary Etiology: Anemia, Hypertension, Lupus Anemia, Hypertension, Lupus Anemia, Hypertension, Lupus Comorbid History: Erythematosus, Osteoarthritis, Erythematosus, Osteoarthritis, Erythematosus, Osteoarthritis, Received Radiation Received Radiation Received Radiation 08/11/2022 08/11/2022 08/11/2022 Date Acquired: 0 0 0 Weeks of Treatment:  Open Open Open Wound Status: No No No Wound Recurrence: Yes No No Clustered Wound: 3 [name]/A [name]/A Clustered Quantity: 1.5x0.5x0.2 8x10x2.3 3.5x7x0.7 Measurements L x W x D (cm) 0.589 62.832 19.242 A (cm) : rea 0.118 144.513 13.47 Volume (cm) : 96.30% 36.50% -63.30% % Reduction in A rea: 96.20% 70.80% 32.70% % Reduction in Volume: 3 Position 1 (o'clock): 2.5 Maximum Distance 1 (cm): 4 Position 2 (o'clock): 2.5 Maximum Distance 2 (cm): 6 Position 3 (o'clock): 1.5 Maximum Distance 3 (cm): 5 Position 4 (o'clock): 1.2 Maximum Distance 4 (cm): [name], [name] [name] ([id number]) [id number] Page 5 of 13 10 Starting Position 1 (o'clock): 2 Ending Position 1 (o'clock): 8.2 Maximum Distance 1 (cm): 9 Starting Position 2 (o'clock): 10 Ending Position 2 (o'clock): 2.6 Maximum Distance 2 (cm): No Yes No Tunneling: No Yes No Undermining: Full Thickness Without Exposed Full Thickness With Exposed Support Full Thickness Without Exposed Classification: Support Structures Structures Support Structures Medium Large Large Exudate A mount: Serous Serosanguineous Serosanguineous Exudate Type: amber red, brown red, brown Exudate Color: [name]/A Well defined, not attached  [name]/A Wound Margin: None Present (0%) None Present (0%) None Present (0%) Granulation Amount: Large (67-100%) Large (67-100%) Large (67-100%) Necrotic Amount: Adherent Slough Eschar Eschar Necrotic Tissue: Fat Layer (Subcutaneous Tissue): Yes Fat Layer (Subcutaneous Tissue): Yes Fat Layer (Subcutaneous Tissue): Yes Exposed Structures: Fascia: No Muscle: Yes Fascia: No Tendon: No Fascia: No Tendon: No Muscle: No Tendon: No Muscle: No Joint: No Joint: No Joint: No Bone: No Bone: No Bone: No None [name]/A None Epithelialization: Treatment Notes Electronic Signature(s) Signed: 08/28/2022 5:15:48 PM By: [name] Entered By: [name] on 08/28/2022 16:21:16 -------------------------------------------------------------------------------- Multi-Disciplinary Care Plan Details Patient Name: Date of Service: [name]. 08/28/2022 3:45 PM Medical Record Number: [id number] Patient Account Number: [id number] Date of Birth/Sex: Treating RN: 1956/02/11 (66 y.o. [name] Primary Care Liddie Chichester: [name] Other Clinician: Massie [name] Referring Xayvier Vallez: Treating Satoria Dunlop/Extender: [name], MICHA EL [place], [name] in Treatment: 0 Active Inactive Abuse / Safety / Falls / Self Care Management Nursing Diagnoses: History of Falls Goals: Patient will remain injury free related to falls Date Initiated: 08/24/2022 Target Resolution Date: 08/24/2022 Goal Status: Active Patient/caregiver will verbalize understanding of skin care regimen Date Initiated: 08/24/2022 Target Resolution Date: 08/24/2022 Goal Status: Active Interventions: Assess fall risk on admission and as needed Notes: Necrotic Tissue [name], [name] [name] ([id number]) [id number] Page 6 of 13 Nursing Diagnoses: Impaired tissue integrity related to necrotic/devitalized tissue Knowledge deficit related to management of necrotic/devitalized  tissue Goals: Necrotic/devitalized tissue will be minimized in the wound bed Date Initiated: 08/24/2022 Target Resolution Date: 08/24/2022 Goal Status: Active Patient/caregiver will verbalize understanding of reason and process for debridement of necrotic tissue Date Initiated: 08/24/2022 Target Resolution Date: 08/24/2022 Goal Status: Active Interventions: Assess patient pain level pre-, during and post procedure and prior to discharge Provide education on necrotic tissue and debridement process Treatment Activities: Apply topical anesthetic as ordered : 08/24/2022 Excisional debridement : 08/24/2022 Notes: Nutrition Nursing Diagnoses: Imbalanced nutrition Impaired glucose control: actual or potential Potential for alteratiion in Nutrition/Potential for imbalanced nutrition Goals: Patient/caregiver agrees to and verbalizes understanding of need to use nutritional supplements and/or vitamins as prescribed Date Initiated: 08/24/2022 Target Resolution Date: 08/24/2022 Goal Status: Active Interventions: Provide education on nutrition Treatment Activities: Dietary management education, guidance and counseling : 08/24/2022 Education provided on Nutrition : 08/24/2022 Notes: Orientation to the Wound Care Program Nursing Diagnoses: Knowledge deficit related to the wound healing [name] program Goals:  Patient/caregiver will verbalize understanding of the [place] Date Initiated: 08/24/2022 Target Resolution Date: 08/24/2022 Goal Status: Active Interventions: Provide education on orientation to the wound [name] Notes: Wound/Skin Impairment Nursing Diagnoses: Impaired tissue integrity Knowledge deficit related to ulceration/compromised skin integrity Goals: Patient/caregiver will verbalize understanding of skin care regimen Date Initiated: 08/24/2022 Target Resolution Date: 08/24/2022 Goal Status: Active Ulcer/skin breakdown will have a volume reduction of 30% by week  4 Date Initiated: 08/24/2022 Target Resolution Date: 09/21/2022 Goal Status: Active Interventions: Assess patient/caregiver ability to obtain necessary supplies Assess ulceration(s) every visit Treatment Activities: Referred to DME [name] for dressing supplies : 08/24/2022 [name], [name] [name] ([id number]) [phone].pdf Page 7 of 13 Skin care regimen initiated : 08/24/2022 Topical wound management initiated : 08/24/2022 Notes: Electronic Signature(s) Signed: 08/28/2022 5:15:48 PM By: [name] Signed: 08/28/2022 5:24:18 PM By: Gretta Cool, BSN, RN, CWS, [name] RN, BSN Entered By: [name] on 08/28/2022 16:20:58 -------------------------------------------------------------------------------- Pain Assessment Details Patient Name: Date of Service: [name]. 08/28/2022 3:45 PM Medical Record Number: [id number] Patient Account Number: [id number] Date of Birth/Sex: Treating RN: 09-Aug-1956 (66 y.o. [name] Primary Care Jequan Shahin: [name] Other Clinician: Massie [name] Referring [employer] Mcjunkins: Treating Qunisha Bryk/Extender: [name], [place], [name] in Treatment: 0 Active Problems Location of Pain Severity and Description of Pain Patient Has Paino Yes Site Locations Pain Location: Pain in Ulcers Rate the pain. Current Pain Level: 4 Worst Pain Level: 6 Least Pain Level: 3 Tolerable Pain Level: 4 Character of Pain Describe the Pain: Shooting Pain Management and Medication Current Pain Management: Medication: Yes Cold Application: No Rest: Yes Massage: No Activity: No T.E.[name].S.: No Heat Application: No Leg drop or elevation: No Is the Current Pain Management Adequate: Inadequate How does your wound impact your activities of daily livingo Sleep: No Bathing: No Appetite: No Relationship With Others: No Bladder Continence: No Emotions: No Bowel Continence: No Work: No Toileting: No Drive: No Dressing:  No HobbiesHANNAHGRACE, LALLI [name] ([id number]) [id number] Page 8 of 13 Electronic Signature(s) Signed: 08/28/2022 5:15:48 PM By: [name] Signed: 08/28/2022 5:24:18 PM By: Gretta Cool, BSN, RN, CWS, [name] RN, BSN Entered By: [name] on 08/28/2022 16:04:49 -------------------------------------------------------------------------------- Patient/Caregiver Education Details Patient Name: Date of Service: [name]. 11/7/2023andnbsp3:45 PM Medical Record Number: [id number] Patient Account Number: [id number] Date of Birth/Gender: Treating RN: 11-06-1955 (66 y.o. [name] Primary Care Physician: [name] Other Clinician: Massie [name] Referring Physician: Treating Physician/Extender: [name], [place], [name] in Treatment: 0 Education Assessment Education Provided To: Patient Education Topics Provided Wound/Skin Impairment: Handouts: Other: continue wound care as directed Methods: Explain/Verbal Responses: State content correctly Electronic Signature(s) Signed: 08/28/2022 5:15:48 PM By: [name] Entered By: [name] on 08/28/2022 16:35:13 -------------------------------------------------------------------------------- Wound Assessment Details Patient Name: Date of Service: [name]. 08/28/2022 3:45 PM Medical Record Number: [id number] Patient Account Number: [id number] Date of Birth/Sex: Treating RN: 10/19/1956 (66 y.o. [name] Primary Care Sueann Brownley: [name] Other Clinician: Massie [name] Referring Courtlyn Aki: Treating Jene Oravec/Extender: [name], [place], [name] in Treatment: 0 Wound Status Wound Number: 1 Primary Trauma, Other Etiology: Wound Location: Left, Proximal Upper Leg Wound Open Wounding Event: Trauma Status: Date Acquired: 08/11/2022 Comorbid Anemia, Hypertension, Lupus Erythematosus, Osteoarthritis, Weeks Of Treatment: [address] QIANNA, CLAGETT [name] ([id number]) [id number] Page 9 of 13 Weeks Of Treatment: 0 History: Received Radiation Clustered Wound: Yes Photos Wound Measurements Length: (cm) Width: (cm)  Depth: (cm) Clustered Quantity: Area: (cm) Volume: (cm) 1.5 % Reduction in Area: 96.3% 0.5 % Reduction in Volume: 96.2% 0.2 Epithelialization: None 3 Tunneling: No 0.589 Undermining: No 0.118 Wound Description Classification: Full Thickness Without Exposed Sup Exudate Amount: Medium Exudate Type: Serous Exudate Color: amber port Structures Foul Odor After Cleansing: No Slough/Fibrino Yes Wound Bed Granulation Amount: None Present (0%) Exposed Structure Necrotic Amount: Large (67-100%) Fascia Exposed: No Necrotic Quality: Adherent Slough Fat Layer (Subcutaneous Tissue) Exposed: Yes Tendon Exposed: No Muscle Exposed: No Joint Exposed: No Bone Exposed: No Treatment Notes Wound #1 (Upper Leg) Wound Laterality: Left, Proximal Cleanser Peri-Wound Care Topical Primary Dressing Xeroform 4x4-HBD (in/in) Discharge Instruction: Apply Xeroform 4x4-HBD (in/in) as directed Secondary Dressing Gauze Discharge Instruction: As directed: dry, moistened with saline or moistened with Dakins Solution Secured With Medipore T - 10M Medipore H Soft Cloth Surgical T ape ape, 2x2 (in/yd) Compression Wrap Compression Stockings Add-Ons Electronic Signature(s) Signed: 08/28/2022 5:15:48 PM By: [name] Signed: 08/28/2022 5:24:18 PM By: Gretta Cool, BSN, RN, CWS, [name] RN, BSN Entered By: [name] on 08/28/2022 16:17:14 [name] ([id number]) [id number] Page 10 of 13 -------------------------------------------------------------------------------- Wound Assessment Details Patient Name: Date of Service: [name] 08/28/2022 3:45 PM Medical Record Number: [id number] Patient Account Number: [id number] Date of Birth/Sex: Treating RN: 06/03/1956 (66  y.o. [name], [name] Primary Care Coraima Tibbs: [name] Other Clinician: Massie [name] Referring Derrich Gaby: Treating Mychaela Lennartz/Extender: [name], [place], [name] in Treatment: 0 Wound Status Wound Number: 2 Primary Trauma, Other Etiology: Wound Location: Left, Distal Upper Leg Wound Open Wounding Event: Trauma Status: Date Acquired: 08/11/2022 Comorbid Anemia, Hypertension, Lupus Erythematosus, Osteoarthritis, Weeks Of Treatment: 0 History: Received Radiation Clustered Wound: No Photos Wound Measurements Length: (cm) 8 Width: (cm) 10 Depth: (cm) 2.3 Area: (cm) 62.832 Volume: (cm) 144.513 % Reduction in Area: 36.5% % Reduction in Volume: 70.8% Tunneling: Yes Location 1 Position (o'clock): 3 Maximum Distance: (cm) 2.5 Location 2 Position (o'clock): 4 Maximum Distance: (cm) 2.5 Location 3 Position (o'clock): 6 Maximum Distance: (cm) 1.5 Location 4 Position (o'clock): 5 Maximum Distance: (cm) 1.2 Undermining: Yes Location 1 Starting Position (o'clock): 10 Ending Position (o'clock): 2 Maximum Distance: (cm) 8.2 Location 2 Starting Position (o'clock): 9 Ending Position (o'clock): 10 Maximum Distance: (cm) 2.6 Wound Description Classification: Full Thickness With Exposed Support St Wound Margin: Well defined, not attached Exudate Amount: [name], [name] [name] ([id number]) Exudate Type: Serosanguineous Exudate Color: red, brown ructures Foul Odor After Cleansing: No Slough/Fibrino No [id number] Page 11 of 13 Wound Bed Granulation Amount: None Present (0%) Exposed Structure Necrotic Amount: Large (67-100%) Fascia Exposed: No Necrotic Quality: Eschar Fat Layer (Subcutaneous Tissue) Exposed: Yes Tendon Exposed: No Muscle Exposed: Yes Necrosis of Muscle: Yes Joint Exposed: No Bone Exposed: No Treatment Notes Wound #2 (Upper Leg) Wound Laterality: Left, Distal Cleanser Peri-Wound Care Topical Primary  Dressing Gauze Discharge Instruction: moistened with Dakins Solution Secondary Dressing ABD Pad 5x9 (in/in) Discharge Instruction: Cover with ABD pad Gauze Discharge Instruction: As directed: dry, moistened with saline or moistened with Dakins Solution Secured With Medipore T - 10M Medipore H Soft Cloth Surgical T ape ape, 2x2 (in/yd) Compression Wrap Compression Stockings Add-Ons Electronic Signature(s) Signed: 08/28/2022 5:15:48 PM By: [name] Signed: 08/28/2022 5:24:18 PM By: Gretta Cool, BSN, RN, CWS, [name] RN, BSN Entered By: [name] on 08/28/2022 16:19:48 -------------------------------------------------------------------------------- Wound Assessment Details Patient Name: Date of Service: [name]. 08/28/2022 3:45 PM Medical Record Number: [id number] Patient Account Number: [id number]  Date of Birth/Sex: Treating RN: [birth year] (66 y.o. [name] Primary Care Yaretzy Olazabal: [name] Other Clinician: Massie [name] Referring Mir Fullilove: Treating Aparna Vanderweele/Extender: [name], MICHA EL [place], [name] in Treatment: 0 Wound Status Wound Number: 3 Primary Trauma, Other Etiology: Wound Location: Right Knee Wound Open Wounding Event: Trauma Status: Date Acquired: 08/11/2022 Comorbid Anemia, Hypertension, Lupus Erythematosus, Osteoarthritis, Weeks Of Treatment: 0 History: Received Radiation Clustered Wound: No [name], [name] [name] ([id number]) [id number] Page 12 of 13 Photos Wound Measurements Length: (cm) 3.5 Width: (cm) 7 Depth: (cm) 0.7 Area: (cm) 19.242 Volume: (cm) 13.47 % Reduction in Area: -63.3% % Reduction in Volume: 32.7% Epithelialization: None Tunneling: No Undermining: No Wound Description Classification: Full Thickness Without Exposed Support Exudate Amount: Large Exudate Type: Serosanguineous Exudate Color: red, brown Structures Foul Odor After Cleansing: No Slough/Fibrino Yes Wound  Bed Granulation Amount: None Present (0%) Exposed Structure Necrotic Amount: Large (67-100%) Fascia Exposed: No Necrotic Quality: Eschar Fat Layer (Subcutaneous Tissue) Exposed: Yes Tendon Exposed: No Muscle Exposed: No Joint Exposed: No Bone Exposed: No Treatment Notes Wound #3 (Knee) Wound Laterality: Right Cleanser Peri-Wound Care Topical Primary Dressing Gauze Discharge Instruction: moistened with Dakins Solution Secondary Dressing (BORDER) Zetuvit Plus SILICONE BORDER Dressing 5x5 (in/in) Discharge Instruction: Please do not put silicone bordered dressings under wraps. Use non-bordered dressing only. Secured With Compression Wrap Compression Stockings Environmental education officer) Signed: 08/28/2022 5:15:48 PM By: [name] Signed: 08/28/2022 5:24:18 PM By: Gretta Cool, BSN, RN, CWS, [name] RN, BSN Entered By: [name] on 08/28/2022 16:20:32 [name], [name] [name] ([id number]) [id number] Page 13 of 13 -------------------------------------------------------------------------------- Vitals Details Patient Name: Date of Service: [name] 08/28/2022 3:45 PM Medical Record Number: [id number] Patient Account Number: [id number] Date of Birth/Sex: Treating RN: 10/30/1955 (66 y.o. [name], [name] Primary Care Muhammad Vacca: [name] Other Clinician: Massie [name] Referring Yasamin Karel: Treating Prisca Gearing/Extender: [name], [place], [name] in Treatment: 0 Vital Signs Time Taken: 16:01 Temperature (F): 98.4 Height (in): 66 Pulse (bpm): 85 Weight (lbs): 188 Respiratory Rate (breaths/min): 18 Body Mass Index (BMI): 30.3 Blood Pressure (mmHg): 144/73 Reference Range: 80 - 120 mg / dl Electronic Signature(s) Signed: 08/28/2022 5:15:48 PM By: [name] Entered By: [name] on 08/28/2022 16:04:45

## 2022-08-29 NOTE — Telephone Encounter (Signed)
Called [employer] patient [employer] for the application for the new year and they state they are missing the patient portion of the form. They said they have the provider portion. Called patient and informed her of this information and she states she will come by today to sign and fill out the form

## 2022-08-29 NOTE — Progress Notes (Signed)
[name], [name] [name] ([id number]) [id number] Page 1 of 6 Visit Report for 08/28/2022 HPI Details Patient Name: Date of Service: [name]. 08/28/2022 3:45 PM Medical Record Number: [id number] Patient Account Number: [id number] Date of Birth/Sex: Treating RN: [birth year] (66 y.o. [name] Primary Care Provider: Jon Billings Other Clinician: Massie Kluver Referring Provider: Treating Provider/Extender: RO BSO N, MICHA EL Juanell Fairly in Treatment: 0 History of Present Illness HPI Description: 08-24-2022 patient presents today for initial evaluation here in our clinic concerning issues that she has been having with wounds in 3 locations. 1 areas in the proximal left thigh this is due to a fall that she sustained on 08-11-2022. Subsequently she has then a second issue in the distal thigh as well as the right knee location. These are due to a unusual situation where she was actually leaving had just been petting her grand dog when she walked to her car. She tells me that she got dizzy and stumbled and fell and when she fell she called out. When she did this subsequently her grand dog ran over were not sure if that he thought he was playing with her what but nonetheless ended up biting her on the left thigh as well as on the right knee. Here she had 2 areas of laceration which she subsequently went to the ER for and they attempted to suture both closed. Unfortunately that just did not take. At this point [name] think the sutures can need to be removed from both locations and to be honest there is a significant area of necrosis on the left thigh this may need to be cleaned out and away. Fortunately I do not see any signs of systemic infection though locally there is some evidence of infection she has been on Augmentin which just recently switched to doxycycline no cultures been performed at this point. From the standpoint of past medical history  the patient does have a history of anemia which they believe is related to B12. She does not have diabetes she does have a history of lupus however. She is a former smoker. 11/7; this is a patient who has a severe dog bite injury on her upper anterior left thigh. Also an area on the right medial knee. We have been seeing her daily and using Dakin's wet-to-dry. [name] was shown yesterday the culture of drainage that was done last week that shows Pseudomonas. She has multiple drug interactions with quinolones therefore [name] gave her a third-generation cephalosporin. HOWEVER apparently the pharmacy would not fill the prescription because of the concerns about allergic reaction to the capsule itself related to beef products. [name] could not find this anywhere even on Google however she has taken capsule antibiotics in the past and [name] told her [name] think that this risk is small compared to not treating the Pseudomonas. She has not been systemically unwell she is in some discomfort Electronic Signature(s) Signed: 08/28/2022 5:01:28 PM By: Linton Ham MD Entered By: Linton Ham on 08/28/2022 16:46:11 -------------------------------------------------------------------------------- Physical Exam Details Patient Name: Date of Service: [name]. 08/28/2022 3:45 PM Medical Record Number: [id number] Patient Account Number: [id number] Date of Birth/Sex: Treating RN: 04-25-1956 (66 y.o. [name] Primary Care Provider: Jon Billings Other Clinician: Massie Kluver Referring Provider: Treating Provider/Extender: RO BSO N, [place], Areatha Keas in Treatment: 0 Constitutional Sitting or standing Blood Pressure is within target range for patient.. Pulse regular and within target range for patient.Marland Kitchen Respirations regular,  non-labored and [name], [name] ([id number]) [id number] Page 2 of 6 within target range.. Temperature is normal and within the target  range for the patient.Marland Kitchen appears in no distress. Notes Wound exam; left upper thigh. Very large necrotic wound. Irregular surface to this is nontender. She has undermining superiorly with necrotic tissue. [name] copiously washed this out with wound cleanser. There is a serous drainage. Our nurses reported green drainage on presentation. There is no palpable tenderness around the wounds no crepitus. The area on the lateral right medial thigh that looks a lot healthier [name] am not concerned here. Electronic Signature(s) Signed: 08/28/2022 5:01:28 PM By: Linton Ham MD Entered By: Linton Ham on 08/28/2022 16:47:32 -------------------------------------------------------------------------------- Physician Orders Details Patient Name: Date of Service: [name]. 08/28/2022 3:45 PM Medical Record Number: [id number] Patient Account Number: [id number] Date of Birth/Sex: Treating RN: 03/08/1956 (66 y.o. [name] Primary Care Provider: Jon Billings Other Clinician: Massie Kluver Referring Provider: Treating Provider/Extender: RO BSO N, [place], Areatha Keas in Treatment: 0 Verbal / Phone Orders: No Diagnosis Coding Follow-up Appointments Return Appointment in 1 week. Anesthetic (Use 'Patient Medications' Section for Anesthetic Order Entry) Lidocaine applied to wound bed Edema Control - Lymphedema / Segmental Compressive Device / Other Elevate, Exercise Daily and A void Standing for Long Periods of Time. Elevate legs to the level of the heart and pump ankles as often as possible Elevate leg(s) parallel to the floor when sitting. Wound Treatment Wound #1 - Upper Leg Wound Laterality: Left, Proximal Prim Dressing: Xeroform 4x4-HBD (in/in) 1 x Per Day/30 Days ary Discharge Instructions: Apply Xeroform 4x4-HBD (in/in) as directed Secondary Dressing: Gauze 1 x Per Day/30 Days Discharge Instructions: As directed: dry, moistened with saline or moistened with Dakins  Solution Secured With: Medipore T - 65M Medipore H Soft Cloth Surgical T ape ape, 2x2 (in/yd) 1 x Per Day/30 Days Wound #2 - Upper Leg Wound Laterality: Left, Distal Prim Dressing: Gauze 1 x Per Day/30 Days ary Discharge Instructions: moistened with Dakins Solution Secondary Dressing: ABD Pad 5x9 (in/in) 1 x Per Day/30 Days Discharge Instructions: Cover with ABD pad Secondary Dressing: Gauze 1 x Per Day/30 Days Discharge Instructions: As directed: dry, moistened with saline or moistened with Dakins Solution Secured With: Medipore T - 65M Medipore H Soft Cloth Surgical T ape ape, 2x2 (in/yd) 1 x Per Day/30 Days Wound #3 - Knee Wound Laterality: Right Prim Dressing: Gauze 1 x Per Day/30 Days ary Discharge Instructions: moistened with [address] HELL[place], VACCARELLA [name] ([id number]) [id number] Page 3 of 6 Secondary Dressing: (BORDER) Zetuvit Plus SILICONE BORDER Dressing 5x5 (in/in) 1 x Per Day/30 Days Discharge Instructions: Please do not put silicone bordered dressings under wraps. Use non-bordered dressing only. Electronic Signature(s) Signed: 08/28/2022 5:01:28 PM By: Linton Ham MD Signed: 08/28/2022 5:15:48 PM By: Massie Kluver Entered By: Massie Kluver on 08/28/2022 16:32:45 -------------------------------------------------------------------------------- Problem List Details Patient Name: Date of Service: [name]. 08/28/2022 3:45 PM Medical Record Number: [id number] Patient Account Number: [id number] Date of Birth/Sex: Treating RN: 1956/03/29 (66 y.o. 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 08/24/2022 No Yes muscle L98.492 Non-pressure chronic ulcer of skin of other sites with fat layer exposed 08/24/2022 No Yes D51.3 Other dietary vitamin B12 deficiency anemia 08/24/2022 No Yes M32.8 Other forms of systemic lupus erythematosus 08/24/2022 No Yes Inactive Problems Resolved Problems SHATARIA, CRIST [name] ([id number]) [id number] Page 4 of 6 Electronic Signature(s) Signed: 08/28/2022 5:01:28 PM By: Linton Ham MD Entered By: Linton Ham on 08/28/2022 16:42:53 -------------------------------------------------------------------------------- Progress Note Details Patient Name: Date of Service: [name]. 08/28/2022 3:45 PM Medical Record Number: [id number] Patient Account Number: [id number] Date of Birth/Sex: Treating RN: 07/05/1956 (66 y.o. [name] Primary Care Provider: Jon Billings Other Clinician: Massie Kluver Referring Provider: Treating Provider/Extender: RO BSO N, [place], Areatha Keas in Treatment: 0 Subjective History of Present Illness (HPI) 08-24-2022 patient presents today for initial evaluation here in our clinic concerning issues that she has been having with wounds in 3 locations. 1 areas in the proximal left thigh this is due to a fall that she sustained on 08-11-2022. Subsequently she has then a second issue in the distal thigh as well as the right knee location. These are due to a unusual situation where she was actually leaving had just been petting her grand dog when she walked to her car. She tells me that she got dizzy and stumbled and fell and when she fell she called out. When she did this subsequently her grand dog  ran over were not sure if that he thought he was playing with her what but nonetheless ended up biting her on the left thigh as well as on the right knee. Here she had 2 areas of laceration which she subsequently went to the ER for and they attempted to suture both closed. Unfortunately that just did not take. At this point [name] think the sutures can need to be removed from both locations and to be honest there is a significant area of necrosis on the left thigh this may need to be cleaned out and away. Fortunately I do not see any signs of systemic infection though locally there is some evidence of infection she has been on Augmentin which just recently switched to doxycycline no cultures been performed at this point. From the standpoint of past medical history the patient does have a history of anemia which they believe is related to B12. She does not have diabetes she does have a history of lupus however. She is a former smoker. 11/7; this is a patient who has a severe dog bite injury on her upper anterior left thigh. Also an area on the right medial knee. We have been seeing her daily and using Dakin's wet-to-dry. [name] was shown yesterday the culture of drainage that was done last week that shows Pseudomonas. She has multiple drug interactions with quinolones therefore [name] gave her a third-generation cephalosporin. HOWEVER apparently the pharmacy would not fill the prescription because of the concerns about allergic reaction to the capsule itself related to beef products. [name] could not find this anywhere even on Google however she has taken capsule antibiotics in the past and [name] told her [name] think that this risk is small compared to not treating the Pseudomonas. She has not been systemically unwell she is in some discomfort Objective Constitutional Sitting or standing Blood Pressure is within target range for patient.. Pulse regular and within target range  for patient.Marland Kitchen Respirations regular, non-labored  and within target range.. Temperature is normal and within the target range for the patient.Marland Kitchen appears in no distress. Vitals Time Taken: 4:01 PM, Height: 66 in, Weight: 188 lbs, BMI: 30.3, Temperature: 98.4 F, Pulse: 85 bpm, Respiratory Rate: 18 breaths/min, Blood Pressure: 144/73 mmHg. General Notes: Wound exam; left upper thigh. Very large necrotic wound. Irregular surface to this is nontender. She has undermining superiorly with necrotic tissue. [name] copiously washed this out with wound cleanser. There is a serous drainage. Our nurses reported green drainage on presentation. There is no palpable tenderness around the wounds no crepitus. The area on the lateral right medial thigh that looks a lot healthier [name] am not concerned here. Integumentary (Hair, Skin) Wound #1 status is Open. Original cause of wound was Trauma. The date acquired was: 08/11/2022. The wound is located on the Left,Proximal Upper Leg. The wound measures 1.5cm length x 0.5cm width x 0.2cm depth; 0.589cm^2 area and 0.118cm^3 volume. There is Fat Layer (Subcutaneous Tissue) exposed. There is no tunneling or undermining noted. There is a medium amount of serous drainage noted. There is no granulation within the wound bed. There is a large (67- 100%) amount of necrotic tissue within the wound bed including Adherent Slough. Wound #2 status is Open. Original cause of wound was Trauma. The date acquired was: 08/11/2022. The wound is located on the Left,Distal Upper Leg. The wound measures 8cm length x 10cm width x 2.3cm depth; 62.832cm^2 area and 144.513cm^3 volume. There is muscle and Fat Layer (Subcutaneous Tissue) [name], [name] ([id number]) [id number] Page 5 of 6 exposed. Tunneling has been noted at 3:00 with a maximum distance of 2.5cm. There is additional tunneling at 4:00 with a maximum distance of 2.5cm, at 6:00 with a maximum distance of 1.5cm, and at 5:00 with a maximum distance of 1.2cm.  Undermining begins at 10:00 and ends at 2:00 with a maximum distance of 8.2cm. There is additional undermining and at 9:00 and ends at 10:00 with a maximum distance of 2.6cm. There is a large amount of serosanguineous drainage noted. The wound margin is well defined and not attached to the wound base. There is no granulation within the wound bed. There is a large (67-100%) amount of necrotic tissue within the wound bed including Eschar and Necrosis of Muscle. Wound #3 status is Open. Original cause of wound was Trauma. The date acquired was: 08/11/2022. The wound is located on the Right Knee. The wound measures 3.5cm length x 7cm width x 0.7cm depth; 19.242cm^2 area and 13.47cm^3 volume. There is Fat Layer (Subcutaneous Tissue) exposed. There is no tunneling or undermining noted. There is a large amount of serosanguineous drainage noted. There is no granulation within the wound bed. There is a large (67- 100%) amount of necrotic tissue within the wound bed including Eschar. 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 Anesthetic (Use 'Patient Medications' Section for Anesthetic Order Entry): Lidocaine applied to wound bed Edema Control - Lymphedema / Segmental Compressive Device / Other: Elevate, Exercise Daily and Avoid Standing for Long Periods of Time. Elevate legs to the  level of the heart and pump ankles as often as possible Elevate leg(s) parallel to the floor when sitting. WOUND #1: - Upper Leg Wound  Laterality: Left, Proximal Prim Dressing: Xeroform 4x4-HBD (in/in) 1 x Per Day/30 Days ary Discharge Instructions: Apply Xeroform 4x4-HBD (in/in) as directed Secondary Dressing: Gauze 1 x Per Day/30 Days Discharge Instructions: As directed: dry, moistened with saline or moistened with Dakins Solution Secured With: Medipore T - 60M Medipore H Soft Cloth Surgical T ape ape, 2x2 (in/yd) 1 x Per Day/30 Days WOUND #2: - Upper Leg Wound Laterality: Left, Distal Prim Dressing: Gauze 1 x Per Day/30 Days ary Discharge Instructions: moistened with Dakins Solution Secondary Dressing: ABD Pad 5x9 (in/in) 1 x Per Day/30 Days Discharge Instructions: Cover with ABD pad Secondary Dressing: Gauze 1 x Per Day/30 Days Discharge Instructions: As directed: dry, moistened with saline or moistened with Dakins Solution Secured With: Medipore T - 60M Medipore H Soft Cloth Surgical T ape ape, 2x2 (in/yd) 1 x Per Day/30 Days WOUND #3: - Knee Wound Laterality: Right Prim Dressing: Gauze 1 x Per Day/30 Days ary Discharge Instructions: moistened with Dakins Solution Secondary Dressing: (BORDER) Zetuvit Plus SILICONE BORDER Dressing 5x5 (in/in) 1 x Per Day/30 Days Discharge Instructions: Please do not put silicone bordered dressings under wraps. Use non-bordered dressing only. 1. Cefdinir 300 twice daily. [name] was at least a bit concerned that there is been a 24-hour delay in taking this. Some of the skin at the superior part of the wound is necrotic. [name] have asked her to start on this ASAP 2. [name] wonder if this is going to require an operative debridemento Plastic surgeryFor operative debridement and possibly a myocutaneous flap 3. She is not systemically unwell and the wound does not appear to have gross soft tissue infection although [name] am concerned about this 4. Continue the Dakin's wet-to-dry dressings Electronic Signature(s) Signed: 08/28/2022 5:01:28 PM By: Linton Ham MD Entered By: Linton Ham on 08/28/2022  16:49:40 [name], [name] ([id number]) [id number] Page 6 of 6 -------------------------------------------------------------------------------- SuperBill Details Patient Name: Date of Service: [name] 08/28/2022 Medical Record Number: [id number] Patient Account Number: [id number] Date of Birth/Sex: Treating RN: April 25, 1956 (66 y.o. 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.3 Other dietary vitamin B12 deficiency anemia M32.8 Other forms of systemic lupus erythematosus Facility Procedures : CPT4 Code: [id number] Description: 75170 - WOUND CARE VISIT-LEV 4 EST PT Modifier: Quantity: 1 Physician Procedures : CPT4 Code Description Modifier [id number] - WC PHYS LEVEL 4 - EST PT ICD-10 Diagnosis Description 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

## 2022-08-29 NOTE — Telephone Encounter (Signed)
Patient came by and filled out her portion of the form. Faxed form to [employer] at [phone] and also to alternative number at [phone]

## 2022-08-30 DIAGNOSIS — M328 Other forms of systemic lupus erythematosus: Secondary | ICD-10-CM | POA: Diagnosis not present

## 2022-08-30 DIAGNOSIS — D513 Other dietary vitamin B12 deficiency anemia: Secondary | ICD-10-CM | POA: Diagnosis not present

## 2022-08-30 DIAGNOSIS — L97812 Non-pressure chronic ulcer of other part of right lower leg with fat layer exposed: Secondary | ICD-10-CM | POA: Diagnosis not present

## 2022-08-30 DIAGNOSIS — L98493 Non-pressure chronic ulcer of skin of other sites with necrosis of muscle: Secondary | ICD-10-CM | POA: Diagnosis not present

## 2022-08-30 NOTE — Progress Notes (Signed)
[name], [name] [name] ([id number]) [id number] Page 1 of 3 Visit Report for 08/29/2022 Arrival Information Details Patient Name: Date of Service: [name]. 08/29/2022 11:15 A M Medical Record Number: [id number] Patient Account Number: [id number] Date of Birth/Sex: Treating RN: Oct 21, 1956 (66 y.o. [name], [name] Primary Care [name]: [name] Other Clinician: Massie [name] Referring [name]: Treating [name]/Extender: [name] in Treatment: 0 Visit Information History Since Last Visit All ordered tests and consults were completed: No Patient Arrived: Ambulatory Added or deleted any medications: No Arrival Time: 11:26 Any new allergies or adverse reactions: No Transfer Assistance: None Had a fall or experienced change in No Patient Requires Transmission-Based Precautions: No activities of daily living that may affect Patient Has Alerts: Yes risk of falls: Patient Alerts: NOT Diabetic Signs or symptoms of abuse/neglect since last visito No Hospitalized since last visit: No Implantable device outside of the clinic excluding No cellular tissue based products placed in the center since last visit: Pain Present Now: Yes Electronic Signature(s) Signed: 08/30/2022 12:52:59 PM By: [name] Entered By: [name] on 08/29/2022 11:26:40 -------------------------------------------------------------------------------- Clinic Level of Care Assessment Details Patient Name: Date of Service: [name]. 08/29/2022 11:15 A M Medical Record Number: [id number] Patient Account Number: [id number] Date of Birth/Sex: Treating RN: Sep 07, 1956 (66 y.o. [name] Primary Care [name]: [name] Other Clinician: Massie [name] Referring Jalaysha Skilton: Treating [name]/Extender: [name] in Treatment: 0 Clinic Level of Care Assessment Items TOOL 4 Quantity Score '[]'$   - 0 Use when only an EandM is performed on FOLLOW-UP visit ASSESSMENTS - Nursing Assessment / Reassessment X- 1 10 Reassessment of Co-morbidities (includes updates in patient status) X- 1 5 Reassessment of Adherence to Treatment Plan [name] [name] ([id number]) [id number] Page 2 of 3 ASSESSMENTS - Wound and Skin A ssessment / Reassessment '[]'$  - Simple Wound Assessment / Reassessment - one wound 0 X- 3 5 Complex Wound Assessment / Reassessment - multiple wounds '[]'$  - 0 Dermatologic / Skin Assessment (not related to wound area) ASSESSMENTS - Focused Assessment '[]'$  - 0 Circumferential Edema Measurements - multi extremities '[]'$  - 0 Nutritional Assessment / Counseling / Intervention '[]'$  - 0 Lower Extremity Assessment (monofilament, tuning fork, pulses) '[]'$  - 0 Peripheral Arterial Disease Assessment (using hand held doppler) ASSESSMENTS - Ostomy and/or Continence Assessment and Care '[]'$  - 0 Incontinence Assessment and Management '[]'$  - 0 Ostomy Care Assessment and Management (repouching, etc.) PROCESS - Coordination of Care X - Simple Patient / Family Education for ongoing care 1 15 '[]'$  - 0 Complex (extensive) Patient / Family Education for ongoing care '[]'$  - 0 Staff obtains Programmer, systems, Records, T Results / Process Orders est '[]'$  - 0 Staff telephones HHA, Nursing Homes / Clarify orders / etc '[]'$  - 0 Routine Transfer to another Facility (non-emergent condition) '[]'$  - 0 Routine Hospital Admission (non-emergent condition) '[]'$  - 0 New Admissions / Biomedical engineer / Ordering NPWT Apligraf, etc. , '[]'$  - 0 Emergency Hospital Admission (emergent condition) X- 1 10 Simple Discharge Coordination '[]'$  - 0 Complex (extensive) Discharge Coordination PROCESS - Special Needs '[]'$  - 0 Pediatric / Minor Patient Management '[]'$  - 0 Isolation Patient Management '[]'$  - 0 Hearing / Language / Visual special needs '[]'$  - 0 Assessment of Community assistance (transportation,  D/C planning, etc.) '[]'$  - 0 Additional assistance / Altered mentation '[]'$  - 0 Support Surface(s) Assessment (bed, cushion, seat, etc.) INTERVENTIONS - Wound Cleansing / Measurement '[]'$  - 0 Simple Wound Cleansing -  one wound X- 3 5 Complex Wound Cleansing - multiple wounds X- 1 5 Wound Imaging (photographs - any number of wounds) '[]'$  - 0 Wound Tracing (instead of photographs) '[]'$  - 0 Simple Wound Measurement - one wound X- 3 5 Complex Wound Measurement - multiple wounds INTERVENTIONS - Wound Dressings '[]'$  - 0 Small Wound Dressing one or multiple wounds X- 3 15 Medium Wound Dressing one or multiple wounds '[]'$  - 0 Large Wound Dressing one or multiple wounds '[]'$  - 0 Application of Medications - topical '[]'$  - 0 Application of Medications - injection INTERVENTIONS - Miscellaneous '[]'$  - 0 External ear exam [name] [name] ([id number]) [id number] Page 3 of 3 '[]'$  - 0 Specimen Collection (cultures, biopsies, blood, body fluids, etc.) '[]'$  - 0 Specimen(s) / Culture(s) sent or taken to Lab for analysis '[]'$  - 0 Patient Transfer (multiple staff / [name] / Similar devices) '[]'$  - 0 Simple Staple / Suture removal (25 or less) '[]'$  - 0 Complex Staple / Suture removal (26 or more) '[]'$  - 0 Hypo / Hyperglycemic Management (close monitor of Blood Glucose) '[]'$  - 0 Ankle / Brachial Index (ABI) - do not check if billed separately '[]'$  - 0 Vital Signs Has the patient been seen at the hospital within the last three years: Yes Total Score: 135 Level Of Care: New/Established - Level 4 Electronic Signature(s) Signed: 08/30/2022 12:52:59 PM By: [name] Entered By: [name] on 08/29/2022 11:53:54 -------------------------------------------------------------------------------- Encounter Discharge Information Details Patient Name: Date of Service: [name]. 08/29/2022 11:15 A M Medical Record Number: [id number] Patient Account Number: [id number] Date of  Birth/Sex: Treating RN: [birth year] (66 y.o. [name], [name] Primary Care [name]: [name] Other Clinician: Massie [name] Referring Bralen Wiltgen: Treating Jaamal Farooqui/Extender: [name] in Treatment: 0 Encounter Discharge Information Items Discharge Condition: Stable Ambulatory Status: Ambulatory Discharge Destination: Home Transportation: Private Auto Accompanied By: self Schedule Follow-up Appointment: Yes Clinical Summary of Care: Electronic Signature(s) Signed: 08/30/2022 12:52:59 PM By: [name] Entered By: [name] on 08/29/2022 11:54:28

## 2022-08-30 NOTE — Progress Notes (Signed)
[name], [name] [name] ([id number]) [id number] Page 1 of 2 Visit Report for 08/30/2022 Physician Orders Details Patient Name: Date of Service: [name]. 08/30/2022 11:15 A M Medical Record Number: [id number] Patient Account Number: [id number] Date of Birth/Sex: Treating RN: 1956-01-12 (66 y.o. [name] Primary Care Provider: Jon Billings Other Clinician: Massie Kluver Referring Provider: Treating Provider/Extender: RO BSO N, [place], [name] in Treatment: 0 Verbal / Phone Orders: No Diagnosis Coding Follow-up Appointments Return Appointment in 1 week. Anesthetic (Use 'Patient Medications' Section for Anesthetic Order Entry) Lidocaine applied to wound bed Edema Control - Lymphedema / Segmental Compressive Device / Other Elevate, Exercise Daily and A void Standing for Long Periods of Time. Elevate legs to the level of the heart and pump ankles as often as possible Elevate leg(s) parallel to the floor when sitting. Wound Treatment Wound #1 - Upper Leg Wound Laterality: Left, Proximal Prim Dressing: Xeroform 4x4-HBD (in/in) 1 x Per Day/30 Days ary Discharge Instructions: Apply Xeroform 4x4-HBD (in/in) as directed Secondary Dressing: Gauze 1 x Per Day/30 Days Discharge Instructions: As directed: dry, moistened with saline or moistened with Dakins Solution Secured With: Medipore T - 37M Medipore H Soft Cloth Surgical T ape ape, 2x2 (in/yd) 1 x Per Day/30 Days Wound #2 - Upper Leg Wound Laterality: Left, Distal Prim Dressing: Gauze 1 x Per Day/30 Days ary Discharge Instructions: moistened with Dakins Solution Secondary Dressing: ABD Pad 5x9 (in/in) 1 x Per Day/30 Days Discharge Instructions: Cover with ABD pad Secondary Dressing: Gauze 1 x Per Day/30 Days Discharge Instructions: As directed: dry, moistened with saline or moistened with Dakins Solution Secured With: Medipore T - 37M Medipore H Soft Cloth Surgical T ape  ape, 2x2 (in/yd) 1 x Per Day/30 Days Wound #3 - Knee Wound Laterality: Right Prim Dressing: Gauze 1 x Per Day/30 Days ary Discharge Instructions: moistened with Dakins Solution Secondary Dressing: (BORDER) Zetuvit Plus SILICONE BORDER Dressing 5x5 (in/in) 1 x Per Day/30 Days Discharge Instructions: Please do not put silicone bordered dressings under wraps. Use non-bordered dressing only. Electronic Signature(s) Signed: 08/30/2022 12:52:59 PM By: Massie Kluver Signed: 08/30/2022 3:56:39 PM By: Linton Ham MD Entered By: Massie Kluver on 08/30/2022 12:04:55 [name] [name] ([id number]) [id number] Page 2 of 2 -------------------------------------------------------------------------------- SuperBill Details Patient Name: Date of Service: [name] 08/30/2022 Medical Record Number: [id number] Patient Account Number: [id number] Date of Birth/Sex: Treating RN: 19-Jan-1956 (66 y.o. 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

## 2022-08-30 NOTE — Progress Notes (Signed)
Kristine, VANBLARCOM [name] ([id number]) [id number] Page 1 of 3 Visit Report for 08/30/2022 Arrival Information Details Patient Name: Date of Service: [name] [name]. 08/30/2022 11:15 A M Medical Record Number: [id number] Patient Account Number: [id number] Date of Birth/Sex: Treating RN: [birth year] (66 y.o. [name], [name] Primary Care Vielka Klinedinst: Jon Billings Other Clinician: Massie Kluver Referring Navada Osterhout: Treating Sarra Rachels/Extender: Eldridge Dace, [place], Areatha Keas in Treatment: 0 Visit Information History Since Last Visit All ordered tests and consults were completed: No Patient Arrived: Ambulatory Added or deleted any medications: No Arrival Time: 11:43 Any new allergies or adverse reactions: No Transfer Assistance: None Had a fall or experienced change in No Patient Requires Transmission-Based Precautions: No activities of daily living that may affect Patient Has Alerts: Yes risk of falls: Patient Alerts: NOT Diabetic Signs or symptoms of abuse/neglect since last visito No Hospitalized since last visit: No Implantable device outside of the clinic excluding No cellular tissue based products placed in the center since last visit: Pain Present Now: Yes Electronic Signature(s) Signed: 08/30/2022 12:52:59 PM By: Massie Kluver Entered By: Massie Kluver on 08/30/2022 11:43:38 -------------------------------------------------------------------------------- Clinic Level of Care Assessment Details Patient Name: Date of Service: [name] [name]. 08/30/2022 11:15 A M Medical Record Number: [id number] Patient Account Number: [id number] Date of Birth/Sex: Treating RN: January 06, 1956 (66 y.o. [name] Primary Care Srihan Brutus: Jon Billings Other Clinician: Massie Kluver Referring Faylene Allerton: Treating Tashawn Greff/Extender: RO BSO N, [place], Areatha Keas in Treatment: 0 Clinic Level of Care Assessment Items TOOL 4 Quantity  Score '[]'$  - 0 Use when only an EandM is performed on FOLLOW-UP visit ASSESSMENTS - Nursing Assessment / Reassessment X- 1 10 Reassessment of Co-morbidities (includes updates in patient status) X- 1 5 Reassessment of Adherence to Treatment Plan EMONII, WIENKE [name] ([id number]) [id number] Page 2 of 3 ASSESSMENTS - Wound and Skin A ssessment / Reassessment '[]'$  - Simple Wound Assessment / Reassessment - one wound 0 X- 2 5 Complex Wound Assessment / Reassessment - multiple wounds '[]'$  - 0 Dermatologic / Skin Assessment (not related to wound area) ASSESSMENTS - Focused Assessment '[]'$  - 0 Circumferential Edema Measurements - multi extremities '[]'$  - 0 Nutritional Assessment / Counseling / Intervention '[]'$  - 0 Lower Extremity Assessment (monofilament, tuning fork, pulses) '[]'$  - 0 Peripheral Arterial Disease Assessment (using hand held doppler) ASSESSMENTS - Ostomy and/or Continence Assessment and Care '[]'$  - 0 Incontinence Assessment and Management '[]'$  - 0 Ostomy Care Assessment and Management (repouching, etc.) PROCESS - Coordination of Care X - Simple Patient / Family Education for ongoing care 1 15 '[]'$  - 0 Complex (extensive) Patient / Family Education for ongoing care '[]'$  - 0 Staff obtains Programmer, systems, Records, T Results / Process Orders est '[]'$  - 0 Staff telephones HHA, Nursing Homes / Clarify orders / etc '[]'$  - 0 Routine Transfer to another Facility (non-emergent condition) '[]'$  - 0 Routine Hospital Admission (non-emergent condition) '[]'$  - 0 New Admissions / Biomedical engineer / Ordering NPWT Apligraf, etc. , '[]'$  - 0 Emergency Hospital Admission (emergent condition) X- 1 10 Simple Discharge Coordination '[]'$  - 0 Complex (extensive) Discharge Coordination PROCESS - Special Needs '[]'$  - 0 Pediatric / Minor Patient Management '[]'$  - 0 Isolation Patient Management '[]'$  - 0 Hearing / Language / Visual special needs '[]'$  - 0 Assessment of Community assistance  (transportation, D/C planning, etc.) '[]'$  - 0 Additional assistance / Altered mentation '[]'$  - 0 Support Surface(s) Assessment (bed, cushion, seat, etc.) INTERVENTIONS - Wound Cleansing /  Measurement '[]'$  - 0 Simple Wound Cleansing - one wound X- 2 5 Complex Wound Cleansing - multiple wounds '[]'$  - 0 Wound Imaging (photographs - any number of wounds) '[]'$  - 0 Wound Tracing (instead of photographs) '[]'$  - 0 Simple Wound Measurement - one wound '[]'$  - 0 Complex Wound Measurement - multiple wounds INTERVENTIONS - Wound Dressings '[]'$  - 0 Small Wound Dressing one or multiple wounds X- 2 15 Medium Wound Dressing one or multiple wounds '[]'$  - 0 Large Wound Dressing one or multiple wounds '[]'$  - 0 Application of Medications - topical '[]'$  - 0 Application of Medications - injection INTERVENTIONS - Miscellaneous '[]'$  - 0 External ear exam VYLETTE, STRUBEL [name] ([id number]) [id number] Page 3 of 3 '[]'$  - 0 Specimen Collection (cultures, biopsies, blood, body fluids, etc.) '[]'$  - 0 Specimen(s) / Culture(s) sent or taken to Lab for analysis '[]'$  - 0 Patient Transfer (multiple staff / Harrel Lemon Lift / Similar devices) '[]'$  - 0 Simple Staple / Suture removal (25 or less) '[]'$  - 0 Complex Staple / Suture removal (26 or more) '[]'$  - 0 Hypo / Hyperglycemic Management (close monitor of Blood Glucose) '[]'$  - 0 Ankle / Brachial Index (ABI) - do not check if billed separately '[]'$  - 0 Vital Signs Has the patient been seen at the hospital within the last three years: Yes Total Score: 90 Level Of Care: New/Established - Level 3 Electronic Signature(s) Signed: 08/30/2022 12:52:59 PM By: Massie Kluver Entered By: Massie Kluver on 08/30/2022 12:06:04 -------------------------------------------------------------------------------- Encounter Discharge Information Details Patient Name: Date of Service: [name] [name]. 08/30/2022 11:15 A M Medical Record Number: [id number] Patient Account Number:  [id number] Date of Birth/Sex: Treating RN: 1956-06-26 (66 y.o. [name] Primary Care Anniece Bleiler: Jon Billings Other Clinician: Massie Kluver Referring Majd Tissue: Treating Carrera Kiesel/Extender: RO BSO N, MICHA EL [place], Areatha Keas in Treatment: 0 Encounter Discharge Information Items Discharge Condition: Stable Ambulatory Status: Ambulatory Discharge Destination: Home Transportation: Private Auto Accompanied By: self Schedule Follow-up Appointment: Yes Clinical Summary of Care: Electronic Signature(s) Signed: 08/30/2022 12:52:59 PM By: Massie Kluver Entered By: Massie Kluver on 08/30/2022 12:05:20

## 2022-08-30 NOTE — Progress Notes (Signed)
VALETA, PAZ I ([id number]) [id number] Page 1 of 2 Visit Report for 08/29/2022 Physician Orders Details Patient Name: Date of Service: [name] 08/29/2022 11:15 A M Medical Record Number: [id number] Patient Account Number: [id number] Date of Birth/Sex: Treating RN: 12-Jul-1956 (66 y.o. [name] Primary Care Provider: Jon Billings Other Clinician: Massie Kluver Referring Provider: Treating Provider/Extender: Jomarie Longs in Treatment: 0 Verbal / Phone Orders: No Diagnosis Coding Follow-up Appointments Return Appointment in 1 week. Anesthetic (Use 'Patient Medications' Section for Anesthetic Order Entry) Lidocaine applied to wound bed Edema Control - Lymphedema / Segmental Compressive Device / Other Elevate, Exercise Daily and A void Standing for Long Periods of Time. Elevate legs to the level of the heart and pump ankles as often as possible Elevate leg(s) parallel to the floor when sitting. Wound Treatment Wound #1 - Upper Leg Wound Laterality: Left, Proximal Prim Dressing: Xeroform 4x4-HBD (in/in) 1 x Per Day/30 Days ary Discharge Instructions: Apply Xeroform 4x4-HBD (in/in) as directed Secondary Dressing: Gauze 1 x Per Day/30 Days Discharge Instructions: As directed: dry, moistened with saline or moistened with Dakins Solution Secured With: Medipore T - 74M Medipore H Soft Cloth Surgical T ape ape, 2x2 (in/yd) 1 x Per Day/30 Days Wound #2 - Upper Leg Wound Laterality: Left, Distal Prim Dressing: Gauze 1 x Per Day/30 Days ary Discharge Instructions: moistened with Dakins Solution Secondary Dressing: ABD Pad 5x9 (in/in) 1 x Per Day/30 Days Discharge Instructions: Cover with ABD pad Secondary Dressing: Gauze 1 x Per Day/30 Days Discharge Instructions: As directed: dry, moistened with saline or moistened with Dakins Solution Secured With: Medipore T - 74M Medipore H Soft Cloth Surgical T ape ape,  2x2 (in/yd) 1 x Per Day/30 Days Wound #3 - Knee Wound Laterality: Right Prim Dressing: Gauze 1 x Per Day/30 Days ary Discharge Instructions: moistened with Dakins Solution Secondary Dressing: (BORDER) Zetuvit Plus SILICONE BORDER Dressing 5x5 (in/in) 1 x Per Day/30 Days Discharge Instructions: Please do not put silicone bordered dressings under wraps. Use non-bordered dressing only. Electronic Signature(s) Signed: 08/29/2022 2:02:44 PM By: Kalman Shan DO Signed: 08/30/2022 12:52:59 PM By: Massie Kluver Entered By: Massie Kluver on 08/29/2022 11:27:12 Allayne Stack I ([id number]) [id number] Page 2 of 2 -------------------------------------------------------------------------------- SuperBill Details Patient Name: Date of Service: [name] 08/29/2022 Medical Record Number: [id number] Patient Account Number: [id number] Date of Birth/Sex: Treating RN: Oct 03, 1956 (66 y.o. 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

## 2022-08-31 DIAGNOSIS — L98493 Non-pressure chronic ulcer of skin of other sites with necrosis of muscle: Secondary | ICD-10-CM | POA: Diagnosis not present

## 2022-08-31 DIAGNOSIS — M328 Other forms of systemic lupus erythematosus: Secondary | ICD-10-CM | POA: Diagnosis not present

## 2022-08-31 DIAGNOSIS — D513 Other dietary vitamin B12 deficiency anemia: Secondary | ICD-10-CM | POA: Diagnosis not present

## 2022-08-31 DIAGNOSIS — L97812 Non-pressure chronic ulcer of other part of right lower leg with fat layer exposed: Secondary | ICD-10-CM | POA: Diagnosis not present

## 2022-08-31 NOTE — Progress Notes (Signed)
[name], [name] ([id number]) [id number] Page 1 of 2 Visit Report for 08/31/2022 Physician Orders Details Patient Name: Date of Service: [name]. 08/31/2022 8:30 A M Medical Record Number: [id number] Patient Account Number: [id number] Date of Birth/Sex: Treating RN: [birth year] (66 y.o. [name] Primary Care Provider: Jon Billings Other Clinician: Referring Provider: Treating Provider/Extender: RO BSO N, MICHA EL Blanche [place], Areatha Keas in Treatment: 1 Verbal / Phone Orders: No Diagnosis Coding Follow-up Appointments Return Appointment in 1 week. Anesthetic (Use 'Patient Medications' Section for Anesthetic Order Entry) Lidocaine applied to wound bed Edema Control - Lymphedema / Segmental Compressive Device / Other Elevate, Exercise Daily and A void Standing for Long Periods of Time. Elevate legs to the level of the heart and pump ankles as often as possible Elevate leg(s) parallel to the floor when sitting. Wound Treatment Wound #1 - Upper Leg Wound Laterality: Left, Proximal Prim Dressing: Xeroform 4x4-HBD (in/in) 1 x Per Day/30 Days ary Discharge Instructions: Apply Xeroform 4x4-HBD (in/in) as directed Secondary Dressing: Gauze 1 x Per Day/30 Days Discharge Instructions: As directed: dry, moistened with saline or moistened with Dakins Solution Secured With: Medipore T - 72M Medipore H Soft Cloth Surgical T ape ape, 2x2 (in/yd) 1 x Per Day/30 Days Wound #2 - Upper Leg Wound Laterality: Left, Distal Prim Dressing: Gauze 1 x Per Day/30 Days ary Discharge Instructions: moistened with Dakins Solution Secondary Dressing: ABD Pad 5x9 (in/in) 1 x Per Day/30 Days Discharge Instructions: Cover with ABD pad Secondary Dressing: Gauze 1 x Per Day/30 Days Discharge Instructions: As directed: dry, moistened with saline or moistened with Dakins Solution Secured With: Medipore T - 72M Medipore H Soft Cloth Surgical T ape ape, 2x2 (in/yd)  1 x Per Day/30 Days Wound #3 - Knee Wound Laterality: Right Prim Dressing: Gauze 1 x Per Day/30 Days ary Discharge Instructions: moistened with Dakins Solution Secondary Dressing: (BORDER) Zetuvit Plus SILICONE BORDER Dressing 5x5 (in/in) 1 x Per Day/30 Days Discharge Instructions: Please do not put silicone bordered dressings under wraps. Use non-bordered dressing only. Electronic Signature(s) Signed: 08/31/2022 12:44:55 PM By: Rosalio Loud MSN RN CNS WTA Signed: 08/31/2022 12:57:19 PM By: Linton Ham MD Entered By: Rosalio Loud on 08/31/2022 09:15:55 [name] ([id number]) [id number] Page 2 of 2 -------------------------------------------------------------------------------- SuperBill Details Patient Name: Date of Service: [name] 08/31/2022 Medical Record Number: [id number] Patient Account Number: [id number] Date of Birth/Sex: Treating RN: [birth year] (66 y.o. 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

## 2022-08-31 NOTE — Progress Notes (Signed)
LAURALIE, BLACKSHER I ([id number]) [id number] Page 1 of 7 Visit Report for 08/31/2022 Arrival Information Details Patient Name: Date of Service: [name] 08/31/2022 8:30 A M Medical Record Number: [id number] Patient Account Number: [id number] Date of Birth/Sex: Treating RN: Nov 05, 1955 (66 y.o. [name] Primary Care Tobin Witucki: Jon Billings Other Clinician: Referring Hanaa Payes: Treating Marinna Blane/Extender: RO BSO N, MICHA EL Blanche [place], Areatha Keas in Treatment: 1 Visit Information History Since Last Visit Added or deleted any medications: No Patient Arrived: Ambulatory Any new allergies or adverse reactions: No Arrival Time: 08:37 Had a fall or experienced change in No Accompanied By: self activities of daily living that may affect Transfer Assistance: None risk of falls: Patient Identification Verified: Yes Hospitalized since last visit: No Secondary Verification Process Completed: Yes Pain Present Now: No Patient Requires Transmission-Based Precautions: No Patient Has Alerts: Yes Patient Alerts: NOT Diabetic Electronic Signature(s) Signed: 08/31/2022 12:44:55 PM By: Rosalio Loud MSN RN CNS WTA Entered By: Rosalio Loud on 08/31/2022 08:38:21 -------------------------------------------------------------------------------- Clinic Level of Care Assessment Details Patient Name: Date of Service: [name] 08/31/2022 8:30 A M Medical Record Number: [id number] Patient Account Number: [id number] Date of Birth/Sex: Treating RN: Jan 08, 1956 (66 y.o. [name] Primary Care Caramia Boutin: Jon Billings Other Clinician: Referring Mayah Urquidi: Treating Elleanna Melling/Extender: RO BSO N, MICHA EL Juanell Fairly in Treatment: 1 Clinic Level of Care Assessment Items TOOL 4 Quantity Score X- 1 0 Use when only an EandM is performed on FOLLOW-UP visit ASSESSMENTS - Nursing Assessment / Reassessment X- 1 10 Reassessment of  Co-morbidities (includes updates in patient status) X- 1 5 Reassessment of Adherence to Treatment Plan ASSESSMENTS - Wound and Skin A ssessment / Reassessment '[]'$  - 0 Simple Wound Assessment / Reassessment - one wound REINE, BRISTOW I ([id number]) [id number] Page 2 of 7 X- 3 5 Complex Wound Assessment / Reassessment - multiple wounds '[]'$  - 0 Dermatologic / Skin Assessment (not related to wound area) ASSESSMENTS - Focused Assessment '[]'$  - 0 Circumferential Edema Measurements - multi extremities '[]'$  - 0 Nutritional Assessment / Counseling / Intervention '[]'$  - 0 Lower Extremity Assessment (monofilament, tuning fork, pulses) '[]'$  - 0 Peripheral Arterial Disease Assessment (using hand held doppler) ASSESSMENTS - Ostomy and/or Continence Assessment and Care '[]'$  - 0 Incontinence Assessment and Management '[]'$  - 0 Ostomy Care Assessment and Management (repouching, etc.) PROCESS - Coordination of Care '[]'$  - 0 Simple Patient / Family Education for ongoing care X- 1 20 Complex (extensive) Patient / Family Education for ongoing care X- 1 10 Staff obtains Programmer, systems, Records, T Results / Process Orders est '[]'$  - 0 Staff telephones HHA, Nursing Homes / Clarify orders / etc '[]'$  - 0 Routine Transfer to another Facility (non-emergent condition) '[]'$  - 0 Routine Hospital Admission (non-emergent condition) '[]'$  - 0 New Admissions / Biomedical engineer / Ordering NPWT Apligraf, etc. , '[]'$  - 0 Emergency Hospital Admission (emergent condition) X- 1 10 Simple Discharge Coordination '[]'$  - 0 Complex (extensive) Discharge Coordination PROCESS - Special Needs '[]'$  - 0 Pediatric / Minor Patient Management '[]'$  - 0 Isolation Patient Management '[]'$  - 0 Hearing / Language / Visual special needs '[]'$  - 0 Assessment of Community assistance (transportation, D/C planning, etc.) '[]'$  - 0 Additional assistance / Altered mentation '[]'$  - 0 Support Surface(s) Assessment (bed, cushion, seat,  etc.) INTERVENTIONS - Wound Cleansing / Measurement '[]'$  - 0 Simple Wound Cleansing - one wound X- 3 5 Complex Wound Cleansing - multiple wounds X- 1 5 Wound Imaging (photographs -  any number of wounds) '[]'$  - 0 Wound Tracing (instead of photographs) '[]'$  - 0 Simple Wound Measurement - one wound X- 1 5 Complex Wound Measurement - multiple wounds INTERVENTIONS - Wound Dressings '[]'$  - 0 Small Wound Dressing one or multiple wounds X- 3 15 Medium Wound Dressing one or multiple wounds '[]'$  - 0 Large Wound Dressing one or multiple wounds '[]'$  - 0 Application of Medications - topical '[]'$  - 0 Application of Medications - injection INTERVENTIONS - Miscellaneous '[]'$  - 0 External ear exam '[]'$  - 0 Specimen Collection (cultures, biopsies, blood, body fluids, etc.) '[]'$  - 0 Specimen(s) / Culture(s) sent or taken to Lab for analysis KARESSA, ONORATO I ([id number]) [id number] Page 3 of 7 '[]'$  - 0 Patient Transfer (multiple staff / Civil Service fast streamer / Similar devices) '[]'$  - 0 Simple Staple / Suture removal (25 or less) '[]'$  - 0 Complex Staple / Suture removal (26 or more) '[]'$  - 0 Hypo / Hyperglycemic Management (close monitor of Blood Glucose) '[]'$  - 0 Ankle / Brachial Index (ABI) - do not check if billed separately '[]'$  - 0 Vital Signs Has the patient been seen at the hospital within the last three years: Yes Total Score: 140 Level Of Care: New/Established - Level 4 Electronic Signature(s) Signed: 08/31/2022 12:44:55 PM By: Rosalio Loud MSN RN CNS WTA Entered By: Rosalio Loud on 08/31/2022 09:17:15 -------------------------------------------------------------------------------- Encounter Discharge Information Details Patient Name: Date of Service: [name] 08/31/2022 8:30 A M Medical Record Number: [id number] Patient Account Number: [id number] Date of Birth/Sex: Treating RN: 24-Sep-1956 (66 y.o. [name] Primary Care Peace Jost: Jon Billings Other  Clinician: Referring Keliyah Spillman: Treating Milana Salay/Extender: RO BSO N, MICHA EL Blanche [place], Areatha Keas in Treatment: 1 Encounter Discharge Information Items Discharge Condition: Stable Ambulatory Status: Ambulatory Discharge Destination: Home Transportation: Private Auto Accompanied By: self Schedule Follow-up Appointment: No Clinical Summary of Care: Electronic Signature(s) Signed: 08/31/2022 12:44:55 PM By: Rosalio Loud MSN RN CNS WTA Entered By: Rosalio Loud on 08/31/2022 09:16:18 -------------------------------------------------------------------------------- Wound Assessment Details Patient Name: Date of Service: [name] 08/31/2022 8:30 A M Medical Record Number: [id number] Patient Account Number: [id number] Date of Birth/Sex: Treating RN: 06/15/1956 (65 y.o. [name] Primary Care Ja Pistole: Jon Billings Other Clinician: SENIYA, STOFFERS I ([id number]) [id number] Page 4 of 7 Referring Joaquin Knebel: Treating Rhiann Boucher/Extender: RO BSO N, MICHA EL Hillis Range Weeks in Treatment: 1 Wound Status Wound Number: 1 Primary Etiology: Trauma, Other Wound Location: Left, Proximal Upper Leg Wound Status: Open Wounding Event: Trauma Date Acquired: 08/11/2022 Weeks Of Treatment: 1 Clustered Wound: Yes Wound Measurements Length: (cm) 1.5 Width: (cm) 0.5 Depth: (cm) 0.2 Area: (cm) 0.589 Volume: (cm) 0.118 % Reduction in Area: 96.3% % Reduction in Volume: 96.2% Wound Description Classification: Full Thickness Without Exposed Support Exudate Amount: Medium Exudate Type: Serous Exudate Color: amber Structures Treatment Notes Wound #1 (Upper Leg) Wound Laterality: Left, Proximal Cleanser Peri-Wound Care Topical Primary Dressing Xeroform 4x4-HBD (in/in) Discharge Instruction: Apply Xeroform 4x4-HBD (in/in) as directed Secondary Dressing Gauze Discharge Instruction: As directed: dry, moistened with saline or  moistened with Dakins Solution Secured With Medipore T - 93M Medipore H Soft Cloth Surgical T ape ape, 2x2 (in/yd) Compression Wrap Compression Stockings Add-Ons Electronic Signature(s) Signed: 08/31/2022 12:44:55 PM By: Rosalio Loud MSN RN CNS WTA Entered By: Rosalio Loud on 08/31/2022 09:14:35 -------------------------------------------------------------------------------- Wound Assessment Details Patient Name: Date of Service: [name] 08/31/2022 8:30 A M Medical Record Number: [id number] Patient Account Number: [id number] Date of  Birth/Sex: Treating RN: [birth year] (66 y.o. [name] Primary Care Angelly Spearing: Jon Billings Other Clinician: Referring Yunis Voorheis: Treating Zaylyn Bergdoll/Extender: RO BSO Delane Ginger, MICHA EL Juanell Fairly in Treatment: [place], [place] I ([id number]) [phone].pdf Page 5 of 7 Wound Status Wound Number: 2 Primary Etiology: Trauma, Other Wound Location: Left, Distal Upper Leg Wound Status: Open Wounding Event: Trauma Date Acquired: 08/11/2022 Weeks Of Treatment: 1 Clustered Wound: No Wound Measurements Length: (cm) 8 Width: (cm) 10 Depth: (cm) 2.3 Area: (cm) 62.832 Volume: (cm) 144.513 % Reduction in Area: 36.5% % Reduction in Volume: 70.8% Wound Description Classification: Full Thickness With Exposed Support St Exudate Amount: Large Exudate Type: Serosanguineous Exudate Color: red, brown ructures Treatment Notes Wound #2 (Upper Leg) Wound Laterality: Left, Distal Cleanser Peri-Wound Care Topical Primary Dressing Gauze Discharge Instruction: moistened with Dakins Solution Secondary Dressing ABD Pad 5x9 (in/in) Discharge Instruction: Cover with ABD pad Gauze Discharge Instruction: As directed: dry, moistened with saline or moistened with Dakins Solution Secured With Medipore T - 54M Medipore H Soft Cloth Surgical T ape ape, 2x2 (in/yd) Compression Wrap Compression  Stockings Add-Ons Electronic Signature(s) Signed: 08/31/2022 12:44:55 PM By: Rosalio Loud MSN RN CNS WTA Entered By: Rosalio Loud on 08/31/2022 09:14:35 -------------------------------------------------------------------------------- Wound Assessment Details Patient Name: Date of Service: [name] 08/31/2022 8:30 A M Medical Record Number: [id number] Patient Account Number: [id number] Date of Birth/Sex: Treating RN: 1956-04-28 (66 y.o. [name] Primary Care Lynise Porr: Jon Billings Other Clinician: Referring Matina Rodier: Treating Jedaiah Rathbun/Extender: [address], MICHA EL Christabel, [place], [place] I ([id number]) [phone].pdf Page 6 of 7 Weeks in Treatment: 1 Wound Status Wound Number: 3 Primary Trauma, Other Etiology: Wound Location: Right Knee Wound Open Wounding Event: Trauma Status: Date Acquired: 08/11/2022 Comorbid Anemia, Hypertension, Lupus Erythematosus, Osteoarthritis, Weeks Of Treatment: 1 History: Received Radiation Clustered Wound: No Photos Wound Measurements Length: (cm) 3.7 Width: (cm) 7 Depth: (cm) 1.5 Area: (cm) 20.342 Volume: (cm) 30.513 % Reduction in Area: -72.7% % Reduction in Volume: -52.4% Epithelialization: None Wound Description Classification: Full Thickness Without Exposed Support Exudate Amount: Large Exudate Type: Serosanguineous Exudate Color: red, brown Structures Foul Odor After Cleansing: No Slough/Fibrino Yes Wound Bed Granulation Amount: None Present (0%) Exposed Structure Necrotic Amount: Large (67-100%) Fascia Exposed: No Necrotic Quality: Eschar Fat Layer (Subcutaneous Tissue) Exposed: Yes Tendon Exposed: No Muscle Exposed: No Joint Exposed: No Bone Exposed: No Treatment Notes Wound #3 (Knee) Wound Laterality: Right Cleanser Peri-Wound Care Topical Primary Dressing Gauze Discharge Instruction: moistened with Dakins Solution Secondary Dressing (BORDER) Zetuvit  Plus SILICONE BORDER Dressing 5x5 (in/in) Discharge Instruction: Please do not put silicone bordered dressings under wraps. Use non-bordered dressing only. Secured With Compression Wrap Compression Stockings Environmental education officer) Signed: 08/31/2022 12:44:55 PM By: Rosalio Loud MSN RN CNS WTA Entered By: Rosalio Loud on 08/31/2022 09:15:30 Allayne Stack I ([id number]) [id number] Page 7 of 7

## 2022-09-03 DIAGNOSIS — M328 Other forms of systemic lupus erythematosus: Secondary | ICD-10-CM | POA: Diagnosis not present

## 2022-09-03 DIAGNOSIS — D513 Other dietary vitamin B12 deficiency anemia: Secondary | ICD-10-CM | POA: Diagnosis not present

## 2022-09-03 DIAGNOSIS — L98493 Non-pressure chronic ulcer of skin of other sites with necrosis of muscle: Secondary | ICD-10-CM | POA: Diagnosis not present

## 2022-09-03 DIAGNOSIS — L97812 Non-pressure chronic ulcer of other part of right lower leg with fat layer exposed: Secondary | ICD-10-CM | POA: Diagnosis not present

## 2022-09-03 NOTE — Progress Notes (Signed)
Kristine, GUDIEL [name] ([id number]) [id number] Page 1 of 2 Visit Report for 09/03/2022 Physician Orders Details Patient Name: Date of Service: [name] 09/03/2022 3:45 PM Medical Record Number: [id number] Patient Account Number: [id number] Date of Birth/Sex: Treating RN: 1955-12-02 (66 y.o. [name] Primary Care Provider: Jon Billings Other Clinician: Massie Kluver Referring Provider: Treating Provider/Extender: Venia Minks in Treatment: 1 Verbal / Phone Orders: No Diagnosis Coding Follow-up Appointments Return Appointment in 1 week. Anesthetic (Use 'Patient Medications' Section for Anesthetic Order Entry) Lidocaine applied to wound bed Edema Control - Lymphedema / Segmental Compressive Device / Other Elevate, Exercise Daily and A void Standing for Long Periods of Time. Elevate legs to the level of the heart and pump ankles as often as possible Elevate leg(s) parallel to the floor when sitting. Medications-Please add to medication list. ntibiotics - continue antibiotics P.O. A Wound Treatment Wound #1 - Upper Leg Wound Laterality: Left, Proximal Prim Dressing: Xeroform 4x4-HBD (in/in) 1 x Per Day/30 Days ary Discharge Instructions: Apply Xeroform 4x4-HBD (in/in) as directed Secondary Dressing: Gauze 1 x Per Day/30 Days Discharge Instructions: As directed: dry, moistened with saline or moistened with Dakins Solution Secured With: Medipore T - 61M Medipore H Soft Cloth Surgical T ape ape, 2x2 (in/yd) 1 x Per Day/30 Days Wound #2 - Upper Leg Wound Laterality: Left, Distal Secured With: Medipore T - 61M Medipore H Soft Cloth Surgical T ape ape, 2x2 (in/yd) 1 x Per Day/30 Days Wound #3 - Knee Wound Laterality: Right Prim Dressing: Gauze 1 x Per Day/30 Days ary Discharge Instructions: moistened with Dakins Solution Secondary Dressing: (BORDER) Zetuvit Plus SILICONE BORDER Dressing 5x5 (in/in) 1 x Per Day/30  Days Discharge Instructions: Please do not put silicone bordered dressings under wraps. Use non-bordered dressing only. Consults Infectious Disease - refer to ID at [facility] asap Patient Medications llergies: Sulfa (Sulfonamide Antibiotics), beef derived (bovine), lamb, milk A Notifications Medication Indication Start End 09/03/2022 Baxdela DOSE 1 - oral 450 mg tablet - 1 tablet oral taken 2 times per day for 14 days. Do not take iron while taking this medication ANITRA, DOXTATER [name] ([id number]) [id number] Page 2 of 2 Electronic Signature(s) Signed: 09/03/2022 5:29:36 PM By: [name] PA-C Entered By: [name] on 09/03/2022 17:29:35 -------------------------------------------------------------------------------- SuperBill Details Patient Name: Date of Service: [name] [name]. 09/03/2022 Medical Record Number: [id number] Patient Account Number: [id number] Date of Birth/Sex: Treating RN: [birth year] (66 y.o. 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

## 2022-09-04 ENCOUNTER — Telehealth: Payer: Self-pay

## 2022-09-04 ENCOUNTER — Encounter: Payer: Medicare Other | Admitting: Physician Assistant

## 2022-09-04 DIAGNOSIS — M328 Other forms of systemic lupus erythematosus: Secondary | ICD-10-CM | POA: Diagnosis not present

## 2022-09-04 DIAGNOSIS — L97812 Non-pressure chronic ulcer of other part of right lower leg with fat layer exposed: Secondary | ICD-10-CM | POA: Diagnosis not present

## 2022-09-04 DIAGNOSIS — K8681 Exocrine pancreatic insufficiency: Secondary | ICD-10-CM

## 2022-09-04 DIAGNOSIS — S71102A Unspecified open wound, left thigh, initial encounter: Secondary | ICD-10-CM | POA: Diagnosis not present

## 2022-09-04 DIAGNOSIS — D513 Other dietary vitamin B12 deficiency anemia: Secondary | ICD-10-CM | POA: Diagnosis not present

## 2022-09-04 DIAGNOSIS — L98493 Non-pressure chronic ulcer of skin of other sites with necrosis of muscle: Secondary | ICD-10-CM | POA: Diagnosis not present

## 2022-09-04 NOTE — Progress Notes (Addendum)
RHEANNE, CORTOPASSI [name] ([id number]) [id number] Page 1 of 9 Visit Report for 09/04/2022 Chief Complaint Document Details Patient Name: Date of Service: [name]. 09/04/2022 3:30 PM Medical Record Number: [id number] Patient Account Number: [id number] Date of Birth/Sex: Treating RN: 1955-11-08 (66 y.o. [name] Primary Care Provider: Jon Billings Other Clinician: Referring Provider: Treating Provider/Extender: Venia Minks in Treatment: 1 Information Obtained from: Patient Chief Complaint Right knee and left thigh ulcers secondary to a dog bite and fall Electronic Signature(s) Signed: 09/04/2022 3:27:12 PM By: [name] PA-C Entered By: [name] on 09/04/2022 15:27:12 -------------------------------------------------------------------------------- Debridement Details Patient Name: Date of Service: [name]. 09/04/2022 3:30 PM Medical Record Number: [id number] Patient Account Number: [id number] Date of Birth/Sex: Treating RN: [birth year] (66 y.o. [name] Primary Care Provider: Jon Billings Other Clinician: Referring Provider: Treating Provider/Extender: Venia Minks in Treatment: 1 Debridement Performed for Assessment: Wound #2 Left,Distal Upper Leg Performed By: Physician [name] Sams., PA-C Debridement Type: Debridement Level of Consciousness (Pre-procedure): Awake and Alert Pre-procedure Verification/Time Out Yes - 16:15 Taken: Start Time: 16:15 T Area Debrided (L x W): otal 2 (cm) x 2 (cm) = 4 (cm) Tissue and other material debrided: Viable, Non-Viable, Slough, Subcutaneous, Skin: Dermis , Skin: Epidermis, Slough Level: Skin/Subcutaneous Tissue Debridement Description: Excisional Instrument: Forceps, Scissors Bleeding: Moderate Hemostasis Achieved: Pressure End Time: 16:20 Procedural Pain: 0 Post Procedural Pain: 0 Response to Treatment:  Procedure was tolerated well Level of Consciousness [name], [name] ([id number]) [id number] Page 2 of 9 Level of Consciousness (Post- Awake and Alert procedure): Post Debridement Measurements of Total Wound Length: (cm) 11 Width: (cm) 12 Depth: (cm) 2 Volume: (cm) 207.345 Character of Wound/Ulcer Post Debridement: Improved Post Procedure Diagnosis Same as Pre-procedure Electronic Signature(s) Signed: 09/05/2022 2:43:36 PM By: [name] PA-C Signed: 09/07/2022 1:14:49 PM By: Carlene Coria RN Entered By: Carlene Coria on 09/04/2022 16:16:51 -------------------------------------------------------------------------------- HPI Details Patient Name: Date of Service: [name]. 09/04/2022 3:30 PM Medical Record Number: [id number] Patient Account Number: [id number] Date of Birth/Sex: Treating RN: 1956/08/11 (66 y.o. [name] Primary Care Provider: Jon Billings Other Clinician: Referring Provider: Treating Provider/Extender: Venia Minks in Treatment: 1 History of Present Illness HPI Description: 08-24-2022 patient presents today for initial evaluation here in our clinic concerning issues that she has been having with wounds in 3 locations. 1 areas in the proximal left thigh this is due to a fall that she sustained on 08-11-2022. Subsequently she has then a second issue in the distal thigh as well as the right knee location. These are due to a unusual situation where she was actually leaving had just been petting her grand dog when she walked to her car. She tells me that she got dizzy and stumbled and fell and when she fell she called out. When she did this subsequently her grand dog ran over were not sure if that he thought he was playing with her what but nonetheless ended up biting her on the left thigh as well as on the right knee. Here she had 2 areas of laceration which she subsequently went to  the ER for and they attempted to suture both closed. Unfortunately that just did not take. At this point [name] think the sutures can need to be removed from both locations and to be honest there is a significant area of necrosis on the left thigh this may need to  be cleaned out and away. Fortunately I do not see any signs of systemic infection though locally there is some evidence of infection she has been on Augmentin which just recently switched to doxycycline no cultures been performed at this point. From the standpoint of past medical history the patient does have a history of anemia which they believe is related to B12. She does not have diabetes she does have a history of lupus however. She is a former smoker. 11/7; this is a patient who has a severe dog bite injury on her upper anterior left thigh. Also an area on the right medial knee. We have been seeing her daily and using Dakin's wet-to-dry. [name] was shown yesterday the culture of drainage that was done last week that shows Pseudomonas. She has multiple drug interactions with quinolones therefore [name] gave her a third-generation cephalosporin. HOWEVER apparently the pharmacy would not fill the prescription because of the concerns about allergic reaction to the capsule itself related to beef products. [name] could not find this anywhere even on Google however she has taken capsule antibiotics in the past and [name] told her [name] think that this risk is small compared to not treating the Pseudomonas. She has not been systemically unwell she is in some discomfort 09-04-2022 upon evaluation today patient unfortunately he is continuing to have some significant issues here with her leg although I do feel like she is better than she was last week. Fortunately there does not appear to be any evidence of systemic infection though locally we definitely noted Pseudomonas as the causative agent on the left thigh location with a culture that needed from the deep tissue wound  area. Subsequently the only active antibiotics that are going to take care of this are Cipro, Levaquin, and Baxdela. Unfortunately the Cipro and Levaquin cannot be taken by her due to the interaction with Plaquenil causing a QT prolongation which is good to be a significant issue. I do not want that to be a complicating factor and therefore we need to look toward the [place] which [name] understand is definitely expensive but [name] am not sure if there is another option to be perfectly honest. [name] discussed this with the patient today and she is in agreement with plan we have also made a referral to infectious disease though if we can get this treated with Baxdela then she may not even end up requiring the infectious disease referral. Upon inspection patient's wound bed actually showed signs of Electronic Signature(s) Signed: 09/04/2022 5:01:07 PM By: [name] PA-C Entered By: [name] on 09/04/2022 17:01:07 [name] ([id number]) [id number] Page 3 of 9 -------------------------------------------------------------------------------- Physical Exam Details Patient Name: Date of Service: [name]. 09/04/2022 3:30 PM Medical Record Number: [id number] Patient Account Number: [id number] Date of Birth/Sex: Treating RN: 1955-12-19 (66 y.o. [name] Primary Care Provider: Jon Billings Other Clinician: Referring Provider: Treating Provider/Extender: Venia Minks in Treatment: 1 Constitutional Well-nourished and well-hydrated in no acute distress. Respiratory normal breathing without difficulty. Psychiatric this patient is able to make decisions and demonstrates good insight into disease process. Alert and Oriented x 3. pleasant and cooperative. Notes Upon inspection patient's wound bed actually showed signs of good granulation and epithelization compared to where we have been although there is definite signs  still of drainage and some infection noted especially in this left thigh that [name] think we need to address as quickly as possible [name] discussed that with her as well.  She is in agreement with taking the [place] but again its not covered by her insurance plan we need to try to obtain approval [name] did contact them for prior authorization today. Electronic Signature(s) Signed: 09/04/2022 5:01:43 PM By: [name] PA-C Entered By: [name] on 09/04/2022 17:01:42 -------------------------------------------------------------------------------- Physician Orders Details Patient Name: Date of Service: [name]. 09/04/2022 3:30 PM Medical Record Number: [id number] Patient Account Number: [id number] Date of Birth/Sex: Treating RN: 09-06-1956 (66 y.o. 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 Anesthetic (Use 'Patient Medications' Section for Anesthetic Order Entry) Lidocaine applied to wound bed Edema Control - Lymphedema / Segmental Compressive Device / Other Elevate, Exercise Daily and A void Standing for Long Periods of Time. Elevate legs  to the level of the heart and pump ankles as often as possible Elevate leg(s) parallel to the floor when sitting. Medications-Please add to medication list. ntibiotics - continue antibiotics P.O. A Wound Treatment Wound #1 - Upper Leg Wound Laterality: Left, Proximal Peri-Wound Care: Skin Prep 1 x Per Day/30 Days Discharge Instructions: Use skin prep as directed Prim Dressing: Xeroform 4x4-HBD (in/in) 1 x Per Day/30 Days ary Discharge Instructions: Apply Xeroform 4x4-HBD (in/in) as directed Secondary Dressing: Coverlet Latex-Free Fabric Adhesive Dressings 1 x Per Day/30 Days Discharge Instructions: 1.5 x 2 Wound #2 - Upper Leg Wound Laterality: Left, Distal Peri-Wound Care: Skin Prep 1 x Per Day/30 Days Discharge Instructions: Use skin prep as directed Prim Dressing: Gauze 1 x Per Day/30 Days ary Discharge Instructions: moistened with Dakins Solution Prim Dressing: hydrofera blue rope 1 x Per Day/30 Days ary Discharge Instructions: insert into tunnel area at 12 oclock Secondary Dressing: ABD Pad 5x9 (in/in) 1 x Per Day/30 Days Discharge Instructions: Cover with ABD pad Secured With: Medipore T - 40M Medipore H Soft Cloth Surgical T ape ape, 2x2 (in/yd) 1 x Per Day/30 Days Wound #3 - Knee Wound Laterality: Right Peri-Wound Care: Skin Prep 1 x Per Day/30 Days Discharge Instructions: Use skin prep as directed Prim Dressing: Gauze 1 x Per Day/30 Days ary Discharge Instructions: moistened with Dakins Solution Secondary Dressing: (BORDER) Zetuvit Plus SILICONE BORDER Dressing 5x5 (in/in) 1 x Per Day/30 Days Discharge Instructions: Please do not put silicone bordered dressings under wraps. Use non-bordered dressing only. Electronic Signature(s) Signed: 09/05/2022 2:43:36 PM By: [name] PA-C Signed: 09/07/2022 1:14:49 PM By: Carlene Coria RN Entered By: Carlene Coria on 09/04/2022 16:38:49 [name] ([id number]) [id number] Page 5 of  9 -------------------------------------------------------------------------------- Problem List Details Patient Name: Date of Service: [name]. 09/04/2022 3:30 PM Medical Record Number: [id number] Patient Account Number: [id number] Date of Birth/Sex: Treating RN: [birth year] (66 y.o. [name] Primary Care Provider: Jon Billings Other Clinician: Referring Provider: Treating Provider/Extender: Venia Minks in Treatment: 1 Active Problems ICD-10  Encounter Code Description Active Date MDM Diagnosis W54.0XXA Bitten by dog, initial encounter 08/24/2022 No Yes L03.116 Cellulitis of left lower limb 09/04/2022 No Yes S80.12XA Contusion of left lower leg, initial encounter 08/24/2022 No Yes T79.2XXA Traumatic secondary and recurrent hemorrhage and seroma, initial encounter 08/24/2022 No Yes S80.812A Abrasion, left lower leg, initial encounter 08/24/2022 No Yes L97.823 Non-pressure chronic ulcer of other part of left lower leg with necrosis of 08/24/2022 No Yes muscle L98.492 Non-pressure chronic ulcer of skin of other sites with fat layer exposed 08/24/2022 No Yes D51.3 Other dietary vitamin B12 deficiency anemia 08/24/2022 No Yes M32.8 Other forms of systemic lupus erythematosus 08/24/2022 No Yes Inactive Problems Resolved Problems Electronic Signature(s) Signed: 09/04/2022 5:00:53 PM By: [name] PA-C Previous Signature: 09/04/2022 3:27:04 PM Version By: [name] PA-C Entered By: [name] on 09/04/2022 17:00:53 [name] ([id number]) [id number] Page 6 of 9 -------------------------------------------------------------------------------- Progress Note Details Patient Name: Date of Service: [name]. 09/04/2022 3:30 PM Medical Record Number: [id number] Patient Account Number: [id number] Date of Birth/Sex: Treating RN: 1956-03-18 (65 y.o. [name] Primary Care Provider:  Jon Billings Other Clinician: Referring Provider: Treating Provider/Extender: Venia Minks in Treatment: 1 Subjective Chief Complaint Information obtained from Patient Right knee and left thigh ulcers secondary to a dog bite and fall History of Present Illness (HPI) 08-24-2022 patient presents today for initial evaluation here in our clinic concerning issues that she has been having with wounds in 3 locations. 1 areas in the proximal left thigh this is due to a fall that she sustained on 08-11-2022. Subsequently she has then a second issue in the distal thigh as well as the right knee location. These are due to a unusual situation where she was actually leaving had just been petting her grand dog when she walked to her car. She tells me that she got dizzy and stumbled and fell and when she fell she called out. When she did this subsequently her grand dog ran over were not sure if that he thought he was playing with her what but nonetheless ended up biting her on the left thigh as well as on the right knee. Here she had 2 areas of laceration which she subsequently went to the ER for and they attempted to suture both closed. Unfortunately that just did not take. At this point [name] think the sutures can need to be removed from both locations and to be honest there is a significant area of necrosis on the left thigh this may need to be cleaned out and away. Fortunately I do not see any signs of systemic infection though locally there is some evidence of infection she has been on Augmentin which just recently switched to doxycycline no cultures been performed at this point. From the standpoint of past medical history the patient does have a history of anemia which they believe is related to B12. She does not have diabetes she does have a history of lupus however. She is a former smoker. 11/7; this is a patient who has a severe dog bite injury on her upper anterior left thigh.  Also an area on the right medial knee. We have been seeing her daily and using Dakin's wet-to-dry. [name] was shown yesterday the culture of drainage that was done last week that shows Pseudomonas. She has multiple drug interactions with quinolones therefore [name] gave her a third-generation cephalosporin. HOWEVER apparently the pharmacy would not fill the prescription because of  the concerns about allergic reaction to the capsule itself related to beef products. [name] could not find this anywhere even on Google however she has taken capsule antibiotics in the past and [name] told her [name] think that this risk is small compared to not treating the Pseudomonas. She has not been systemically unwell she is in some discomfort 09-04-2022 upon evaluation today patient unfortunately he is continuing to have some significant issues here with her leg although I do feel like she is better than she was last week. Fortunately there does not appear to be any evidence of systemic infection though locally we definitely noted Pseudomonas as the causative agent on the left thigh location with a culture that needed from the deep tissue wound area. Subsequently the only active antibiotics that are going to take care of this are Cipro, Levaquin, and Baxdela. Unfortunately the Cipro and Levaquin cannot be taken by her due to the interaction with Plaquenil causing a QT prolongation which is good to be a significant issue. I do not want that to be a complicating factor and therefore we need to look toward the [place] which [name] understand is definitely expensive but [name] am not sure if there is another option to be perfectly honest. [name] discussed this with the patient today and she is in agreement with plan we have also made a referral to infectious disease though if we can get this treated with Baxdela then she may not even end up requiring the infectious disease referral. Upon inspection patient's wound bed actually showed signs  of Objective Constitutional Well-nourished and well-hydrated in no acute distress. Vitals Time Taken: 3:45 PM, Height: 66 in, Weight: 188 lbs, BMI: 30.3, Temperature: 98.2 F, Pulse: 87 bpm, Respiratory Rate: 18 breaths/min, Blood Pressure: 149/85 mmHg. Respiratory normal breathing without difficulty. Psychiatric this patient is able to make decisions and demonstrates good insight into disease process. Alert and Oriented x 3. pleasant and cooperative. General Notes: Upon inspection patient's wound bed actually showed signs of good granulation and epithelization compared to where we have been although there is definite signs still of drainage and some infection noted especially in this left thigh that [name] think we need to address as quickly as possible [name] discussed that with her as well. She is in agreement with taking the [place] but again its not covered by her insurance plan we need to try to obtain approval [name] did contact them for prior authorization today. Integumentary (Hair, Skin) Wound #1 status is Open. Original cause of wound was Trauma. The date acquired was: 08/11/2022. The wound has been in treatment 1 weeks. The wound is located on the Left,Proximal Upper Leg. The wound measures 1.5cm length x 0.5cm width x 0.2cm depth; 0.589cm^2 area and 0.118cm^3 volume. There is Fat Layer (Subcutaneous Tissue) exposed. There is no tunneling or undermining noted. There is a medium amount of serous drainage noted. There is small (1-33%) pink granulation within the wound bed. There is a large (67-100%) amount of necrotic tissue within the wound bed including Adherent Slough. Wound #2 status is Open. Original cause of wound was Trauma. The date acquired was: 08/11/2022. The wound has been in treatment 1 weeks. The wound is located on the Left,Distal Upper Leg. The wound measures 11cm length x 12cm width x 2cm depth; 103.673cm^2 area and 207.345cm^3 volume. There is Fat Layer (Subcutaneous Tissue)  exposed. There is no tunneling or undermining noted. There is a medium amount of serosanguineous drainage noted. There is medium (34-66%) pink granulation within  the wound bed. There is a medium (34-66%) amount of necrotic tissue within the wound bed including Adherent Slough. [name], [name] ([id number]) [id number] Page 7 of 9 Wound #3 status is Open. Original cause of wound was Trauma. The date acquired was: 08/11/2022. The wound has been in treatment 1 weeks. The wound is located on the Right Knee. The wound measures 3.6cm length x 6.5cm width x 1cm depth; 18.378cm^2 area and 18.378cm^3 volume. There is Fat Layer (Subcutaneous Tissue) exposed. There is no tunneling or undermining noted. There is a large amount of serosanguineous drainage noted. There is no granulation within the wound bed. There is a large (67-100%) amount of necrotic tissue within the wound bed including Eschar. 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 There was a Excisional Skin/Subcutaneous Tissue Debridement with a total area of 4 sq cm performed by [name] Sams., PA-C. With the following instrument(s): Forceps, and Scissors to remove Viable and Non-Viable tissue/material. Material removed includes Subcutaneous Tissue, Slough, Skin: Dermis, and Skin: Epidermis. No specimens were taken. A time out was conducted at  16:15, prior to the start of the procedure. A Moderate amount of bleeding was controlled with Pressure. The procedure was tolerated well with a pain level of 0 throughout and a pain level of 0 following the procedure. Post Debridement Measurements: 11cm length x 12cm width x 2cm depth; 207.345cm^3 volume. Character of Wound/Ulcer Post Debridement is improved. Post procedure Diagnosis Wound #2: Same as Pre-Procedure Plan Follow-up Appointments: Return Appointment in 1 week. Anesthetic (Use 'Patient Medications' Section for Anesthetic Order Entry): Lidocaine applied to wound bed Edema Control - Lymphedema / Segmental Compressive Device / Other: Elevate, Exercise Daily and Avoid Standing for Long Periods of Time. Elevate legs to the level of the heart and pump ankles as often as possible Elevate leg(s) parallel to the floor when sitting. Medications-Please add to medication list.: P.O. Antibiotics - continue antibiotics WOUND #1: - Upper Leg Wound Laterality: Left, Proximal Peri-Wound Care: Skin Prep 1 x Per Day/30 Days Discharge Instructions: Use skin prep as directed Prim Dressing: Xeroform 4x4-HBD (in/in) 1 x Per Day/30 Days ary Discharge Instructions: Apply Xeroform 4x4-HBD (in/in) as directed Secondary Dressing: Coverlet Latex-Free Fabric Adhesive Dressings 1 x Per Day/30 Days Discharge Instructions: 1.5 x 2 WOUND #2: - Upper Leg Wound Laterality: Left, Distal Peri-Wound Care: Skin Prep 1 x Per Day/30 Days Discharge Instructions: Use skin prep as directed Prim Dressing: Gauze 1 x Per Day/30 Days ary Discharge Instructions: moistened with Dakins Solution Prim Dressing: hydrofera blue rope 1 x Per Day/30 Days ary Discharge Instructions: insert into tunnel area at 12 oclock Secondary Dressing: ABD Pad 5x9 (in/in) 1 x Per Day/30 Days Discharge Instructions: Cover with ABD pad Secured With: Medipore T - 77M Medipore H Soft Cloth Surgical T ape ape, 2x2 (in/yd) 1 x Per Day/30  Days WOUND #3: - Knee Wound Laterality: Right Peri-Wound Care: Skin Prep 1 x Per Day/30 Days Discharge Instructions: Use skin prep as directed Prim Dressing: Gauze 1  x Per Day/30 Days ary Discharge Instructions: moistened with Dakins Solution Secondary Dressing: (BORDER) Zetuvit Plus SILICONE BORDER Dressing 5x5 (in/in) 1 x Per Day/30 Days Discharge Instructions: Please do not put silicone bordered dressings under wraps. Use non-bordered dressing only. 1. I do believe that the patient is going require Baxdela due to the fact that there is no interaction with the Plaquenil. 2. [name] did contact [facility] for approval regarding the [place] and that has been initiated [name] [place] send a copy of this note today again the reason she cannot take Cipro or Levaquin is due to the interaction with Plaquenil causing QT prolongation. Again that means that the only oral option is good to be [name], [name] ([id number]) [id number] Page 8 of 9 think is necessary in this patient to try to get the infection under control so we will keep her out of the hospital and also prevent loss of limb and life. This is a significant wound that has undergone a significant debridement and there still is a significant infection. We need to try to get this under control soon as possible my hope is to get her into a treatment with wound VAC therapy on this left thigh soon as possible to try to get this wound healed. 3. For the time being we will get a continue with the Dakin's moistened gauze packing which [name] think is doing well to help clean up the wound is much as possible. We will see patient back for reevaluation in 1 week here in the clinic. If anything worsens or changes patient will contact our office for additional recommendations. Electronic Signature(s) Signed: 09/04/2022 5:02:49 PM By: [name] PA-C Entered By: [name] on 09/04/2022  17:02:49 -------------------------------------------------------------------------------- SuperBill Details Patient Name: Date of Service: [name]. 09/04/2022 Medical Record Number: [id number] Patient Account Number: [id number] Date of Birth/Sex: Treating RN: 23-Mar-1956 (66 y.o. 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.3 Other dietary vitamin B12 deficiency anemia M32.8 Other forms of systemic lupus erythematosus Facility Procedures : CPT4 Code: 93716967 Description: 89381 - DEB SUBQ TISSUE 20 SQ CM/< ICD-10 Diagnosis Description L97.823 Non-pressure chronic ulcer of other part of left lower leg with necrosis of musc L98.492 Non-pressure chronic ulcer of skin of other sites with fat layer exposed Modifier: le Quantity: 1 Physician Procedures : CPT4 Code Description Modifier [id number] - WC PHYS LEVEL 4 - EST PT 25 ICD-10 Diagnosis Description W54.0XXA Bitten by dog, initial encounter L03.116 Cellulitis of left lower limb S80.12XA Contusion of left lower leg, initial encounter T79.2XXA  Traumatic secondary and recurrent hemorrhage and seroma, initial encounter Quantity: 1 : [id number] 11042 - WC PHYS SUBQ TISS 20 SQ CM ICD-10 Diagnosis Description 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

## 2022-09-04 NOTE — Telephone Encounter (Signed)
She can take 3 to 4 capsules of Zenpep with each meal and 1-2 with snack  RV

## 2022-09-04 NOTE — Progress Notes (Addendum)
LUBERTHA, LEITE [name] ([id number]) [id number] Page 1 of 10 Visit Report for 09/04/2022 Arrival Information Details Patient Name: Date of Service: [name]. 09/04/2022 3:30 PM Medical Record Number: [id number] Patient Account Number: [id number] Date of Birth/Sex: Treating RN: 1956-07-25 (66 y.o. [name], [place] Primary Care Marialuisa Basara: Jon Billings Other Clinician: Referring Kemp Gomes: Treating Vega Withrow/Extender: Venia Minks in Treatment: 1 Visit Information History Since Last Visit All ordered tests and consults were completed: No Patient Arrived: Ambulatory Added or deleted any medications: No Arrival Time: 15:44 Any new allergies or adverse reactions: No Accompanied By: self Had a fall or experienced change in No Transfer Assistance: None activities of daily living that may affect Patient Identification Verified: Yes risk of falls: Secondary Verification Process Completed: Yes Signs or symptoms of abuse/neglect since last visito No Patient Requires Transmission-Based Precautions: No Hospitalized since last visit: No Patient Has Alerts: Yes Implantable device outside of the clinic excluding No Patient Alerts: NOT Diabetic cellular tissue based products placed in the center since last visit: Has Dressing in Place as Prescribed: Yes Pain Present Now: No Electronic Signature(s) Signed: 09/07/2022 1:14:49 PM By: Carlene Coria RN Entered By: Carlene Coria on 09/04/2022 15:45:06 -------------------------------------------------------------------------------- Clinic Level of Care Assessment Details Patient Name: Date of Service: [name]. 09/04/2022 3:30 PM Medical Record Number: [id number] Patient Account Number: [id number] Date of Birth/Sex: Treating RN: Mar 28, 1956 (66 y.o. [name] Primary Care Cassidee Deats: Jon Billings Other Clinician: Referring Lakyn Mantione: Treating Romeka Scifres/Extender: Venia Minks in Treatment: 1 Clinic Level of Care Assessment Items TOOL 1 Quantity Score '[]'$  - 0 Use when EandM and Procedure is performed on INITIAL visit ASSESSMENTS - Nursing Assessment / Reassessment '[]'$  - 0 General Physical Exam (combine w/ comprehensive assessment (listed just below) when performed on new pt. evals) '[]'$  - 0 Comprehensive Assessment (HX, ROS, Risk Assessments, Wounds Hx, etc.) ADAIAH, JASKOT [name] ([id number]) [id number] Page 2 of 10 ASSESSMENTS - Wound and Skin Assessment / Reassessment '[]'$  - 0 Dermatologic / Skin Assessment (not related to wound area) ASSESSMENTS - Ostomy and/or Continence Assessment and Care '[]'$  - 0 Incontinence Assessment and Management '[]'$  - 0 Ostomy Care Assessment and Management (repouching, etc.) PROCESS - Coordination of Care '[]'$  - 0 Simple Patient / Family Education for ongoing care '[]'$  - 0 Complex (extensive) Patient / Family Education for ongoing care '[]'$  - 0 Staff obtains Programmer, systems, Records, T Results / Process Orders est '[]'$  - 0 Staff telephones HHA, Nursing Homes / Clarify orders / etc '[]'$  - 0 Routine Transfer to another Facility (non-emergent condition) '[]'$  - 0 Routine Hospital Admission (non-emergent condition) '[]'$  - 0 New Admissions / Biomedical engineer / Ordering NPWT Apligraf, etc. , '[]'$  - 0 Emergency Hospital Admission (emergent condition) PROCESS - Special Needs '[]'$  - 0 Pediatric / Minor Patient Management '[]'$  - 0 Isolation Patient Management '[]'$  - 0 Hearing / Language / Visual special needs '[]'$  - 0 Assessment of Community assistance (transportation, D/C planning, etc.) '[]'$  - 0 Additional assistance / Altered mentation '[]'$  - 0 Support Surface(s) Assessment (bed, cushion, seat, etc.) INTERVENTIONS - Miscellaneous '[]'$  - 0 External ear exam '[]'$  - 0 Patient Transfer (multiple staff / Civil Service fast streamer / Similar devices) '[]'$  - 0 Simple Staple / Suture removal (25 or less) '[]'$  -  0 Complex Staple / Suture removal (26 or more) '[]'$  - 0 Hypo/Hyperglycemic Management (do not check if billed separately) '[]'$  - 0 Ankle / Brachial Index (ABI) - do not check if  billed separately Has the patient been seen at the hospital within the last three years: Yes Total Score: 0 Level Of Care: ____ Electronic Signature(s) Signed: 09/07/2022 1:14:49 PM By: Carlene Coria RN Entered By: Carlene Coria on 09/04/2022 16:42:11 -------------------------------------------------------------------------------- Encounter Discharge Information Details Patient Name: Date of Service: [name]. 09/04/2022 3:30 PM Medical Record Number: [id number] Patient Account Number: [id number] Date of Birth/Sex: Treating RN: [birth year] (66 y.o. [name] Primary Care Kesley Gaffey: Jon Billings Other Clinician: Referring Marcoantonio Legault: Treating Jermain Curt/Extender: Leonides Grills [place], [place] [name] ([id number]) [phone].pdf Page 3 of 10 Weeks in Treatment: 1 Encounter Discharge Information Items Post Procedure Vitals Discharge Condition: Stable Temperature (F): 98.2 Ambulatory Status: Ambulatory Pulse (bpm): 87 Discharge Destination: Home Respiratory Rate (breaths/min): 18 Transportation: Private Auto Blood Pressure (mmHg): 149/85 Accompanied By: self Schedule Follow-up Appointment: Yes Clinical Summary of Care: Electronic Signature(s) Signed: 09/04/2022 5:13:10 PM By: Carlene Coria RN Entered By: Carlene Coria on 09/04/2022 17:13:10 -------------------------------------------------------------------------------- Lower Extremity Assessment Details Patient Name: Date of Service: [name]. 09/04/2022 3:30 PM Medical Record Number: [id number] Patient Account Number: [id number] Date of Birth/Sex: Treating RN: [birth year] (66 y.o. [name] Primary Care Darrelyn Morro: Jon Billings Other Clinician: Referring Alizey Noren: Treating  Corrina Steffensen/Extender: Venia Minks in Treatment: 1 Electronic Signature(s) Signed: 09/07/2022 1:14:49 PM By: Carlene Coria RN Entered By: Carlene Coria on 09/04/2022 15:58:40 -------------------------------------------------------------------------------- Multi Wound Chart Details Patient Name: Date of Service: [name]. 09/04/2022 3:30 PM Medical Record Number: [id number] Patient Account Number: [id number] Date of Birth/Sex: Treating RN: 05/23/1956 (66 y.o. [name] Primary Care Aileen Amore: Jon Billings Other Clinician: Referring Dwight Burdo: Treating Evelio Rueda/Extender: Venia Minks in Treatment: 1 Vital Signs Height(in): 66 Pulse(bpm): 87 Weight(lbs): 188 Blood Pressure(mmHg): 149/85 Body Mass Index(BMI): 30.3 Temperature(F): 98.2 Respiratory Rate(breaths/min): 18 [Ernest, Sherlene [name] ([id number]):Photos:] [[id number].pdf Page 4 of 10:1 2 3] Left, Proximal Upper Leg Left, Distal Upper Leg Right Knee Wound Location: Trauma Trauma Trauma Wounding Event: Trauma, Other Trauma, Other Trauma, Other Primary Etiology: Anemia, Hypertension, Lupus Anemia, Hypertension, Lupus Anemia, Hypertension, Lupus Comorbid History: Erythematosus, Osteoarthritis, Erythematosus, Osteoarthritis, Erythematosus, Osteoarthritis, Received Radiation Received Radiation Received Radiation 08/11/2022 08/11/2022 08/11/2022 Date Acquired: '1 1 1 '$ Weeks of Treatment: Open Open Open Wound Status: No No No Wound Recurrence: Yes No No Clustered Wound: 1.5x0.5x0.2 11x12x2 3.6x6.5x1 Measurements L x W x D (cm) 0.589 103.673 18.378 A (cm) : rea 0.118 207.345 18.378 Volume (cm) : 96.30% -4.80% -56.00% % Reduction in Area: 96.20% 58.10% 8.20% % Reduction in Volume: Full Thickness Without Exposed Full Thickness With Exposed Support Full Thickness Without Exposed Classification: Support Structures Structures Support  Structures Medium Medium Large Exudate A mount: Serous Serosanguineous Serosanguineous Exudate Type: amber red, brown red, brown Exudate Color: Small (1-33%) Medium (34-66%) None Present (0%) Granulation Amount: Pink Pink N/A Granulation Quality: Large (67-100%) Medium (34-66%) Large (67-100%) Necrotic Amount: Adherent Slough Adherent Slough Eschar Necrotic Tissue: Fat Layer (Subcutaneous Tissue): Yes Fat Layer (Subcutaneous Tissue): Yes Fat Layer (Subcutaneous Tissue): Yes Exposed Structures: Fascia: No Fascia: No Fascia: No Tendon: No Tendon: No Tendon: No Muscle: No Muscle: No Muscle: No Joint: No Joint: No Joint: No Bone: No Bone: No Bone: No None None None Epithelialization: Treatment Notes Electronic Signature(s) Signed: 09/07/2022 1:14:49 PM By: Carlene Coria RN Entered By: Carlene Coria on 09/04/2022 15:59:36 -------------------------------------------------------------------------------- [place] Details Patient Name: Date of Service: [name]. 09/04/2022 3:30 PM Medical Record Number: [id number] Patient Account Number:  [id number] Date of Birth/Sex: Treating RN: 08/24/1956 (66 y.o. [name] Primary Care Berdia Lachman: Jon Billings Other Clinician: Referring Jamira Barfuss: Treating Hartlyn Reigel/Extender: Venia Minks in Treatment: 1 Active Inactive Abuse / Safety / Falls / Self Care Management STEVEE, VALENTA [name] ([id number]) [id number] Page 5 of 10 Nursing Diagnoses: History of Falls Goals: Patient will remain injury free related to falls Date Initiated: 08/24/2022 Target Resolution Date: 08/24/2022 Goal Status: Active Patient/caregiver will verbalize understanding of skin care regimen Date Initiated: 08/24/2022 Target Resolution Date: 08/24/2022 Goal Status: Active Interventions: Assess fall risk on admission and as needed Notes: Orientation to the Wound Care  Program Nursing Diagnoses: Knowledge deficit related to the wound healing center program Goals: Patient/caregiver will verbalize understanding of the [place] Date Initiated: 08/24/2022 Target Resolution Date: 08/24/2022 Goal Status: Active Interventions: Provide education on orientation to the wound center Notes: Wound/Skin Impairment Nursing Diagnoses: Impaired tissue integrity Knowledge deficit related to ulceration/compromised skin integrity Goals: Patient/caregiver will verbalize understanding of skin care regimen Date Initiated: 08/24/2022 Target Resolution Date: 08/24/2022 Goal Status: Active Ulcer/skin breakdown will have a volume reduction of 30% by week 4 Date Initiated: 08/24/2022 Target Resolution Date: 09/21/2022 Goal Status: Active Interventions: Assess patient/caregiver ability to obtain necessary supplies Assess ulceration(s) every visit Treatment Activities: Referred to DME Laloni Rowton for dressing supplies : 08/24/2022 Skin care regimen initiated : 08/24/2022 Topical wound management initiated : 08/24/2022 Notes: Electronic Signature(s) Signed: 09/07/2022 1:14:49 PM By: Carlene Coria RN Entered By: Carlene Coria on 09/04/2022 15:59:23 -------------------------------------------------------------------------------- Pain Assessment Details Patient Name: Date of Service: [name]. 09/04/2022 3:30 PM [name] ([id number]) [id number] Page 6 of 10 Medical Record Number: [id number] Patient Account Number: [id number] Date of Birth/Sex: Treating RN: [birth year] (66 y.o. [name] Primary Care Ayana Imhof: Jon Billings Other Clinician: Referring Thomes Burak: Treating Signa Cheek/Extender: Venia Minks in Treatment: 1 Active Problems Location of Pain Severity and Description of Pain Patient Has Paino No Site Locations Pain Management and Medication Current Pain  Management: Electronic Signature(s) Signed: 09/07/2022 1:14:49 PM By: Carlene Coria RN Entered By: Carlene Coria on 09/04/2022 15:45:36 -------------------------------------------------------------------------------- Patient/Caregiver Education Details Patient Name: Date of Service: [name]. 11/14/2023andnbsp3:30 PM Medical Record Number: [id number] Patient Account Number: [id number] Date of Birth/Gender: Treating RN: [birth year] (66 y.o. [name] Primary Care Physician: Jon Billings Other Clinician: Referring Physician: Treating Physician/Extender: Venia Minks in Treatment: 1 Education Assessment Education Provided To: Patient Education Topics Provided Welcome T [facility] [place]: o Methods: Explain/Verbal Responses: State content correctly Electronic Signature(s) LAKARA, WEILAND [name] ([id number]) [id number] Page 7 of 10 Signed: 09/07/2022 1:14:49 PM By: Carlene Coria RN Entered By: Carlene Coria on 09/04/2022 16:42:35 -------------------------------------------------------------------------------- Wound Assessment Details Patient Name: Date of Service: [name]. 09/04/2022 3:30 PM Medical Record Number: [id number] Patient Account Number: [id number] Date of Birth/Sex: Treating RN: 06/26/1956 (66 y.o. [name] Primary Care Carston Riedl: Jon Billings Other Clinician: Referring Magali Bray: Treating Cherrish Vitali/Extender: Venia Minks in Treatment: 1 Wound Status Wound Number: 1 Primary Trauma, Other Etiology: Wound Location: Left, Proximal Upper Leg Wound Open Wounding Event: Trauma Status: Date Acquired: 08/11/2022 Comorbid Anemia, Hypertension, Lupus Erythematosus, Osteoarthritis, Weeks Of Treatment: 1 History: Received Radiation Clustered Wound: Yes Photos Wound Measurements Length: (cm) 1.5 Width: (cm) 0.5 Depth: (cm) 0.2 Area: (cm) 0.589 Volume:  (cm) 0.118 % Reduction in Area: 96.3% % Reduction in Volume: 96.2% Epithelialization: None Tunneling: No Undermining: No Wound Description Classification:  Full Thickness Without Exposed Suppor Exudate Amount: Medium Exudate Type: Serous Exudate Color: amber t Structures Foul Odor After Cleansing: No Slough/Fibrino Yes Wound Bed Granulation Amount: Small (1-33%) Exposed Structure Granulation Quality: Pink Fascia Exposed: No Necrotic Amount: Large (67-100%) Fat Layer (Subcutaneous Tissue) Exposed: Yes Necrotic Quality: Adherent Slough Tendon Exposed: No Muscle Exposed: No Joint Exposed: No Bone Exposed: No Electronic Signature(s) Signed: 09/07/2022 1:14:49 PM By: Carlene Coria RN Entered By: Carlene Coria on 09/04/2022 15:56:07 [name] ([id number]) [id number] Page 8 of 10 -------------------------------------------------------------------------------- Wound Assessment Details Patient Name: Date of Service: [name]. 09/04/2022 3:30 PM Medical Record Number: [id number] Patient Account Number: [id number] Date of Birth/Sex: Treating RN: 06-27-1956 (66 y.o. [name] Primary Care Junell Cullifer: Jon Billings Other Clinician: Referring Nylani Michetti: Treating Matyas Baisley/Extender: Venia Minks in Treatment: 1 Wound Status Wound Number: 2 Primary Trauma, Other Etiology: Wound Location: Left, Distal Upper Leg Wound Open Wounding Event: Trauma Status: Date Acquired: 08/11/2022 Comorbid Anemia, Hypertension, Lupus Erythematosus, Osteoarthritis, Weeks Of Treatment: 1 History: Received Radiation Clustered Wound: No Photos Wound Measurements Length: (cm) 11 Width: (cm) 12 Depth: (cm) 2 Area: (cm) 103.673 Volume: (cm) 207.345 % Reduction in Area: -4.8% % Reduction in Volume: 58.1% Epithelialization: None Tunneling: No Undermining: No Wound Description Classification: Full Thickness With Exposed  Suppo Exudate Amount: Medium Exudate Type: Serosanguineous Exudate Color: red, brown rt Structures Wound Bed Granulation Amount: Medium (34-66%) Exposed Structure Granulation Quality: Pink Fascia Exposed: No Necrotic Amount: Medium (34-66%) Fat Layer (Subcutaneous Tissue) Exposed: Yes Necrotic Quality: Adherent Slough Tendon Exposed: No Muscle Exposed: No Joint Exposed: No Bone Exposed: No Electronic Signature(s) Signed: 09/07/2022 1:14:49 PM By: Carlene Coria RN Entered By: Carlene Coria on 09/04/2022 15:57:41 [name] ([id number]) [id number] Page 9 of 10 -------------------------------------------------------------------------------- Wound Assessment Details Patient Name: Date of Service: [name]. 09/04/2022 3:30 PM Medical Record Number: [id number] Patient Account Number: [id number] Date of Birth/Sex: Treating RN: August 04, 1956 (66 y.o. [name] Primary Care Pratt Bress: Jon Billings Other Clinician: Referring Japheth Diekman: Treating Arrion Broaddus/Extender: Venia Minks in Treatment: 1 Wound Status Wound Number: 3 Primary Trauma, Other Etiology: Wound Location: Right Knee Wound Open Wounding Event: Trauma Status: Date Acquired: 08/11/2022 Comorbid Anemia, Hypertension, Lupus Erythematosus, Osteoarthritis, Weeks Of Treatment: 1 History: Received Radiation Clustered Wound: No Photos Wound Measurements Length: (cm) 3.6 Width: (cm) 6.5 Depth: (cm) 1 Area: (cm) 18.378 Volume: (cm) 18.378 % Reduction in Area: -56% % Reduction in Volume: 8.2% Epithelialization: None Tunneling: No Undermining: No Wound Description Classification: Full Thickness Without Exposed Support Exudate Amount: Large Exudate Type: Serosanguineous Exudate Color: red, brown Structures Foul Odor After Cleansing: No Slough/Fibrino Yes Wound Bed Granulation Amount: None Present (0%) Exposed Structure Necrotic Amount: Large  (67-100%) Fascia Exposed: No Necrotic Quality: Eschar Fat Layer (Subcutaneous Tissue) Exposed: Yes Tendon Exposed: No Muscle Exposed: No Joint Exposed: No Bone Exposed: No Electronic Signature(s) Signed: 09/07/2022 1:14:49 PM By: Carlene Coria RN Entered By: Carlene Coria on 09/04/2022 15:58:32 [name] ([id number]) [id number].pdf Page 10 of 10 -------------------------------------------------------------------------------- Vitals Details Patient Name: Date of Service: [name]. 09/04/2022 3:30 PM Medical Record Number: [id number] Patient Account Number: [id number] Date of Birth/Sex: Treating RN: 08/11/1956 (66 y.o. [name] Primary Care Scotty Weigelt: Jon Billings Other Clinician: Referring Shawnta Zimbelman: Treating Clarie Camey/Extender: Venia Minks in Treatment: 1 Vital Signs Time Taken: 15:45 Temperature (F): 98.2 Height (in): 66 Pulse (bpm): 87 Weight (lbs): 188 Respiratory Rate (breaths/min): 18 Body Mass Index (  BMI): 30.3 Blood Pressure (mmHg): 149/85 Reference Range: 80 - 120 mg / dl Electronic Signature(s) Signed: 09/07/2022 1:14:49 PM By: Carlene Coria RN Entered By: Carlene Coria on 09/04/2022 15:45:27

## 2022-09-04 NOTE — Telephone Encounter (Signed)
Informed patient of this information and she verbalized understanding

## 2022-09-04 NOTE — Telephone Encounter (Signed)
Patient called back and states the strength is 20,000 units of the Zenpep. She states she is still taking the creon 2 capsules with the first bite of each meal and 1 capsule with the first bite of each snack. She asked after repeat CT scan on 10/08/2022 if everything comes back normal can she stop or cut back on the pancreatic enzyme

## 2022-09-04 NOTE — Telephone Encounter (Signed)
-----   Message from Shelby Mattocks, [place] sent at 04/09/2022  3:02 PM EDT ----- Repeat 6 months CT abdominal and Pelvis for EPI

## 2022-09-04 NOTE — Telephone Encounter (Signed)
Called patient informed patient in December it would be time for repeat CT scan. She states she can do it when ever. Called and got patient schedule for 10/08/2022 check in at 1:45pm for a 2:00pm scan at out patient imaging. Nothing to eat or drink 4 hours prior.

## 2022-09-04 NOTE — Telephone Encounter (Signed)
Informed patient of this information and she verbalized understanding.   Address is [address] [place], [place] [address]. Phone number is [phone]

## 2022-09-04 NOTE — Telephone Encounter (Signed)
She asked how she should take the Zenpep

## 2022-09-05 DIAGNOSIS — L98493 Non-pressure chronic ulcer of skin of other sites with necrosis of muscle: Secondary | ICD-10-CM | POA: Diagnosis not present

## 2022-09-05 DIAGNOSIS — M328 Other forms of systemic lupus erythematosus: Secondary | ICD-10-CM | POA: Diagnosis not present

## 2022-09-05 DIAGNOSIS — L97812 Non-pressure chronic ulcer of other part of right lower leg with fat layer exposed: Secondary | ICD-10-CM | POA: Diagnosis not present

## 2022-09-05 DIAGNOSIS — D513 Other dietary vitamin B12 deficiency anemia: Secondary | ICD-10-CM | POA: Diagnosis not present

## 2022-09-05 NOTE — Progress Notes (Unsigned)
LMP 08/22/2008    Subjective:    Patient ID: [name], female    DOB: 1956-06-05, 66 y.o.   MRN: [id number]  HPI: [name] is a 66 y.o. female  No chief complaint on file.  DOG BITE Patient is here to follow up on dog bite.  Was initially seen on October 22 and stiches were placed.  Patient was seen On October 31 and the edges of the wound were not well approximated at that time and stitches were not removed.  She completed the course of amoxicillin and has started the course of doxycycline she was previously prescribed.   Patient did fall on her right knee yesterday and landed on the area where stiches are placed.   ANXIETY Patient states she has been struggling with anxiety.  After her fall yesterday she laid there is cried due to being frustrated.  She feels like she would benefit from something to take the edge off. She does not want to take anything that causes weight gain.  [place] Office Visit from 08/15/2022 in [place]  PHQ-9 Total Score 11         08/15/2022    3:28 PM 07/31/2022    3:39 PM 03/01/2022    1:59 PM 01/18/2022    2:56 PM  GAD 7 : Generalized Anxiety Score  Nervous, Anxious, on Edge '2 1 2 1  '$ Control/stop worrying '2 1 2 1  '$ Worry too much - different things '1 1 2 1  '$ Trouble relaxing '1 1 2 1  '$ Restless  1 2 0  Easily annoyed or irritable '2 1 2 1  '$ Afraid - awful might happen  '1 1 1  '$ Total GAD 7 Score  '7 13 6  '$ Anxiety Difficulty Somewhat difficult Somewhat difficult Somewhat difficult Not difficult at all       Relevant past medical, surgical, family and social history reviewed and updated as indicated. Interim medical history since our last visit reviewed. Allergies and medications reviewed and updated.  Review of Systems  Skin:  Positive for wound.  Psychiatric/Behavioral:  The patient is nervous/anxious.     Per HPI unless specifically indicated above     Objective:    LMP 08/22/2008   Wt Readings  from Last 3 Encounters:  08/23/22 188 lb 1.6 oz (85.3 kg)  08/21/22 188 lb 9.6 oz (85.5 kg)  08/20/22 187 lb (84.8 kg)    Physical Exam Vitals and nursing note reviewed.  Constitutional:      General: She is not in acute distress.    Appearance: Normal appearance. She is normal weight. She is not ill-appearing, toxic-appearing or diaphoretic.  HENT:     Head: Normocephalic.     Right Ear: External ear normal.     Left Ear: External ear normal.     Nose: Nose normal.     Mouth/Throat:     Mouth: Mucous membranes are moist.     Pharynx: Oropharynx is clear.  Eyes:     General:        Right eye: No discharge.        Left eye: No discharge.     Extraocular Movements: Extraocular movements intact.     Conjunctiva/sclera: Conjunctivae normal.     Pupils: Pupils are equal, round, and reactive to light.  Cardiovascular:     Rate and Rhythm: Normal rate and regular rhythm.     Heart sounds: No murmur heard. Pulmonary:     Effort: Pulmonary effort  is normal. No respiratory distress.     Breath sounds: Normal breath sounds. No wheezing or rales.  Musculoskeletal:     Cervical back: Normal range of motion and neck supple.  Skin:    General: Skin is warm and dry.     Capillary Refill: Capillary refill takes less than 2 seconds.       Neurological:     General: No focal deficit present.     Mental Status: She is alert and oriented to person, place, and time. Mental status is at baseline.  Psychiatric:        Mood and Affect: Mood normal.        Behavior: Behavior normal.        Thought Content: Thought content normal.        Judgment: Judgment normal.    Results for orders placed or performed during the hospital encounter of 08/24/22  Aerobic Culture w Gram Stain (superficial specimen)   Specimen: Wound  Result Value Ref Range   Specimen Description      WOUND Performed at [facility], [address], [place], [place] [address]    Special Requests      LEFT  LEG Performed at [facility], [address] [place], [place] [address]    Gram Stain      NO WBC SEEN NO ORGANISMS SEEN Performed at [facility], [place] [address] [place],  [address]    Culture FEW PSEUDOMONAS AERUGINOSA    Report Status 08/27/2022 FINAL    Organism ID, Bacteria PSEUDOMONAS AERUGINOSA       Susceptibility   Pseudomonas aeruginosa - MIC*    CEFTAZIDIME 2 SENSITIVE Sensitive     CIPROFLOXACIN <=0.25 SENSITIVE Sensitive     GENTAMICIN <=1 SENSITIVE Sensitive     IMIPENEM 2 SENSITIVE Sensitive     PIP/TAZO <=4 SENSITIVE Sensitive     * FEW PSEUDOMONAS AERUGINOSA      Assessment & Plan:   Problem List Items Addressed This Visit   None    Follow up plan: No follow-ups on file.

## 2022-09-05 NOTE — Progress Notes (Signed)
[name], [name] [name] ([id number]) [id number] Page 1 of 5 Visit Report for 09/03/2022 Arrival Information Details Patient Name: Date of Service: [name] 09/03/2022 3:45 PM Medical Record Number: [id number] Patient Account Number: [id number] Date of Birth/Sex: Treating RN: 1956/06/10 (66 y.o. [name], [name]: [name] Other Clinician: Massie [name] Referring [name]: Treating [name]/Extender: [name] in Treatment: 1 Visit Information History Since Last Visit All ordered tests and consults were completed: No Patient Arrived: Ambulatory Added or deleted any medications: No Arrival Time: 16:34 Any new allergies or adverse reactions: No Transfer Assistance: None Had a fall or experienced change in No Patient Requires Transmission-Based Precautions: No activities of daily living that may affect Patient Has Alerts: Yes risk of falls: Patient Alerts: NOT Diabetic Signs or symptoms of abuse/neglect since last visito No Hospitalized since last visit: No Implantable device outside of the clinic excluding No cellular tissue based products placed in the center since last visit: Pain Present Now: Yes Electronic Signature(s) Signed: 09/04/2022 7:52:55 AM By: [name] Entered By: [name] on 09/03/2022 16:35:11 -------------------------------------------------------------------------------- Clinic Level of Care Assessment Details Patient Name: Date of Service: [name] 09/03/2022 3:45 PM Medical Record Number: [id number] Patient Account Number: [id number] Date of Birth/Sex: Treating RN: 08-28-1956 (66 y.o. [name] Primary Care [name]: [name] Other Clinician: Massie [name] Referring [name]: Treating [name]/Extender: [name] in Treatment: 1 Clinic Level of Care Assessment Items TOOL 4 Quantity Score '[]'$  - 0 Use  when only an EandM is performed on FOLLOW-UP visit ASSESSMENTS - Nursing Assessment / Reassessment X- 1 10 Reassessment of Co-morbidities (includes updates in patient status) X- 1 5 Reassessment of Adherence to Treatment Plan [name] ([id number]) [id number] Page 2 of 5 ASSESSMENTS - Wound and Skin A ssessment / Reassessment '[]'$  - Simple Wound Assessment / Reassessment - one wound 0 X- 2 5 Complex Wound Assessment / Reassessment - multiple wounds '[]'$  - 0 Dermatologic / Skin Assessment (not related to wound area) ASSESSMENTS - Focused Assessment '[]'$  - 0 Circumferential Edema Measurements - multi extremities '[]'$  - 0 Nutritional Assessment / Counseling / Intervention '[]'$  - 0 Lower Extremity Assessment (monofilament, tuning fork, pulses) '[]'$  - 0 Peripheral Arterial Disease Assessment (using hand held doppler) ASSESSMENTS - Ostomy and/or Continence Assessment and Care '[]'$  - 0 Incontinence Assessment and Management '[]'$  - 0 Ostomy Care Assessment and Management (repouching, etc.) PROCESS - Coordination of Care X - Simple Patient / Family Education for ongoing care 1 15 '[]'$  - 0 Complex (extensive) Patient / Family Education for ongoing care '[]'$  - 0 Staff obtains Programmer, systems, Records, T Results / Process Orders est '[]'$  - 0 Staff telephones HHA, Nursing Homes / Clarify orders / etc '[]'$  - 0 Routine Transfer to another Facility (non-emergent condition) '[]'$  - 0 Routine Hospital Admission (non-emergent condition) '[]'$  - 0 New Admissions / Biomedical engineer / Ordering NPWT Apligraf, etc. , '[]'$  - 0 Emergency Hospital Admission (emergent condition) X- 1 10 Simple Discharge Coordination '[]'$  - 0 Complex (extensive) Discharge Coordination PROCESS - Special Needs '[]'$  - 0 Pediatric / Minor Patient Management '[]'$  - 0 Isolation Patient Management '[]'$  - 0 Hearing / Language / Visual special needs '[]'$  - 0 Assessment of Community assistance (transportation, D/C  planning, etc.) '[]'$  - 0 Additional assistance / Altered mentation '[]'$  - 0 Support Surface(s) Assessment (bed, cushion, seat, etc.) INTERVENTIONS - Wound Cleansing / Measurement '[]'$  - 0 Simple Wound Cleansing - one wound  X- 2 5 Complex Wound Cleansing - multiple wounds X- 1 5 Wound Imaging (photographs - any number of wounds) '[]'$  - 0 Wound Tracing (instead of photographs) '[]'$  - 0 Simple Wound Measurement - one wound X- 2 5 Complex Wound Measurement - multiple wounds INTERVENTIONS - Wound Dressings '[]'$  - 0 Small Wound Dressing one or multiple wounds X- 2 15 Medium Wound Dressing one or multiple wounds '[]'$  - 0 Large Wound Dressing one or multiple wounds '[]'$  - 0 Application of Medications - topical '[]'$  - 0 Application of Medications - injection INTERVENTIONS - Miscellaneous '[]'$  - 0 External ear exam [name] ([id number]) [id number] Page 3 of 5 '[]'$  - 0 Specimen Collection (cultures, biopsies, blood, body fluids, etc.) '[]'$  - 0 Specimen(s) / Culture(s) sent or taken to Lab for analysis '[]'$  - 0 Patient Transfer (multiple staff / Harrel Lemon Lift / Similar devices) '[]'$  - 0 Simple Staple / Suture removal (25 or less) '[]'$  - 0 Complex Staple / Suture removal (26 or more) '[]'$  - 0 Hypo / Hyperglycemic Management (close monitor of Blood Glucose) '[]'$  - 0 Ankle / Brachial Index (ABI) - do not check if billed separately '[]'$  - 0 Vital Signs Has the patient been seen at the hospital within the last three years: Yes Total Score: 105 Level Of Care: New/Established - Level 3 Electronic Signature(s) Signed: 09/04/2022 7:52:55 AM By: [name] Entered By: [name] on 09/03/2022 17:17:05 -------------------------------------------------------------------------------- Encounter Discharge Information Details Patient Name: Date of Service: [name]. 09/03/2022 3:45 PM Medical Record Number: [id number] Patient Account Number: [id number] Date of  Birth/Sex: Treating RN: [birth year] (66 y.o. [name] Primary Care [name]: [name] Other Clinician: Massie [name] Referring Garv Kuechle: Treating Chaos Carlile/Extender: [name] in Treatment: 1 Encounter Discharge Information Items Discharge Condition: Stable Ambulatory Status: Ambulatory Discharge Destination: Home Transportation: Private Auto Accompanied By: self Schedule Follow-up Appointment: Yes Clinical Summary of Care: Electronic Signature(s) Signed: 09/04/2022 7:52:55 AM By: [name] Entered By: [name] on 09/03/2022 17:16:47 -------------------------------------------------------------------------------- Wound Assessment Details Patient Name: Date of Service: [name]. 09/03/2022 3:45 PM [name] ([id number]) [id number] Page 4 of 5 Medical Record Number: [id number] Patient Account Number: [id number] Date of Birth/Sex: Treating RN: 11-24-1955 (66 y.o. [name], Kristine Primary Care Fina Heizer: [name] Other Clinician: Massie [name] Referring Urban Naval: Treating Jesse Hirst/Extender: [name] in Treatment: 1 Wound Status Wound Number: 1 Primary Trauma, Other Etiology: Wound Location: Left, Proximal Upper Leg Wound Open Wounding Event: Trauma Status: Date Acquired: 08/11/2022 Comorbid Anemia, Hypertension, Lupus Erythematosus, Osteoarthritis, Weeks Of Treatment: 1 History: Received Radiation Clustered Wound: Yes Wound Measurements Length: (cm) 1.5 Width: (cm) 0.5 Depth: (cm) 0.2 Area: (cm) 0.589 Volume: (cm) 0.118 % Reduction in Area: 96.3% % Reduction in Volume: 96.2% Wound Description Classification: Full Thickness Without Exposed Support Exudate Amount: Medium Exudate Type: Serous Exudate Color: amber Structures Electronic Signature(s) Signed: 09/04/2022 7:52:55 AM By: [name] Signed: 09/04/2022 5:24:20 PM By:  Gretta Cool, BSN, RN, CWS, [name] RN, BSN Entered By: [name] on 09/03/2022 16:35:50 -------------------------------------------------------------------------------- Wound Assessment Details Patient Name: Date of Service: [name]. 09/03/2022 3:45 PM Medical Record Number: [id number] Patient Account Number: [id number] Date of Birth/Sex: Treating RN: 16-Mar-1956 (66 y.o. [name] Primary Care Chandra Asher: [name] Other Clinician: Massie [name] Referring Birdie Fetty: Treating Roanna Reaves/Extender: [name] in Treatment: 1 Wound Status Wound Number: 2 Primary Trauma, Other Etiology: Wound Location: Left, Distal Upper Leg Wound  Open Wounding Event: Trauma Status: Date Acquired: 08/11/2022 Comorbid Anemia, Hypertension, Lupus Erythematosus, Osteoarthritis, Weeks Of Treatment: 1 History: Received Radiation Clustered Wound: No Wound Measurements Length: (cm) 8 Width: (cm) 10 Depth: (cm) 2.3 Area: (cm) 62.832 Volume: (cm) 144.513 % Reduction in Area: 36.5% % Reduction in Volume: 70.8% Wound Description Classification: Full Thickness With Exposed Support Structures Exudate Amount: [name], [name] ([id number]) Exudate Type: Serosanguineous Exudate Color: red, brown [id number] Page 5 of 5 Electronic Signature(s) Signed: 09/04/2022 7:52:55 AM By: [name] Signed: 09/04/2022 5:24:20 PM By: Gretta Cool, BSN, RN, CWS, [name] RN, BSN Entered By: [name] on 09/03/2022 16:57:18 -------------------------------------------------------------------------------- Wound Assessment Details Patient Name: Date of Service: [name]. 09/03/2022 3:45 PM Medical Record Number: [id number] Patient Account Number: [id number] Date of Birth/Sex: Treating RN: September 28, 1956 (66 y.o. [name], Kristine Primary Care Rayvon Dakin: [name] Other Clinician: Massie [name] Referring Niani Mourer: Treating  [name]/Extender: [name] in Treatment: 1 Wound Status Wound Number: 3 Primary Trauma, Other Etiology: Wound Location: Right Knee Wound Open Wounding Event: Trauma Status: Date Acquired: 08/11/2022 Comorbid Anemia, Hypertension, Lupus Erythematosus, Osteoarthritis, Weeks Of Treatment: 1 History: Received Radiation Clustered Wound: No Wound Measurements Length: (cm) 3.7 Width: (cm) 7 Depth: (cm) 1.5 Area: (cm) 20.342 Volume: (cm) 30.513 % Reduction in Area: -72.7% % Reduction in Volume: -52.4% Epithelialization: None Wound Description Classification: Full Thickness Without Exposed Support Exudate Amount: Large Exudate Type: Serosanguineous Exudate Color: red, brown Structures Foul Odor After Cleansing: No Slough/Fibrino Yes Wound Bed Granulation Amount: None Present (0%) Exposed Structure Necrotic Amount: Large (67-100%) Fascia Exposed: No Necrotic Quality: Eschar Fat Layer (Subcutaneous Tissue) Exposed: Yes Tendon Exposed: No Muscle Exposed: No Joint Exposed: No Bone Exposed: No Electronic Signature(s) Signed: 09/04/2022 7:52:55 AM By: [name] Signed: 09/04/2022 5:24:20 PM By: Gretta Cool, BSN, RN, CWS, [name] RN, BSN Entered By: [name] on 09/03/2022 16:57:25

## 2022-09-06 ENCOUNTER — Encounter: Payer: Self-pay | Admitting: Nurse Practitioner

## 2022-09-06 ENCOUNTER — Ambulatory Visit (INDEPENDENT_AMBULATORY_CARE_PROVIDER_SITE_OTHER): Payer: Medicare Other | Admitting: Nurse Practitioner

## 2022-09-06 VITALS — BP 110/60 | HR 87 | Temp 98.4°F | Wt 181.3 lb

## 2022-09-06 DIAGNOSIS — W540XXA Bitten by dog, initial encounter: Secondary | ICD-10-CM | POA: Diagnosis not present

## 2022-09-06 DIAGNOSIS — D519 Vitamin B12 deficiency anemia, unspecified: Secondary | ICD-10-CM

## 2022-09-06 DIAGNOSIS — S81859A Open bite, unspecified lower leg, initial encounter: Secondary | ICD-10-CM | POA: Diagnosis not present

## 2022-09-06 DIAGNOSIS — L98493 Non-pressure chronic ulcer of skin of other sites with necrosis of muscle: Secondary | ICD-10-CM | POA: Diagnosis not present

## 2022-09-06 DIAGNOSIS — L97812 Non-pressure chronic ulcer of other part of right lower leg with fat layer exposed: Secondary | ICD-10-CM | POA: Diagnosis not present

## 2022-09-06 DIAGNOSIS — M328 Other forms of systemic lupus erythematosus: Secondary | ICD-10-CM | POA: Diagnosis not present

## 2022-09-06 DIAGNOSIS — D513 Other dietary vitamin B12 deficiency anemia: Secondary | ICD-10-CM | POA: Diagnosis not present

## 2022-09-06 MED ORDER — OXYCODONE HCL 5 MG PO TABS: 5.0000 mg | ORAL_TABLET | Freq: Four times a day (QID) | ORAL | 0 refills | Status: DC | PRN

## 2022-09-06 NOTE — Assessment & Plan Note (Signed)
Chronic. Ongoing.  Wound has been debrided and packed.  Following up with wound clinic daily.  Oxycodone refilled at visit today.  Advised not to take medication with klonopin due to side effects.  Follow up in 1 month.  Call sooner if concerns arise.

## 2022-09-06 NOTE — Assessment & Plan Note (Signed)
Chronic. Labs reviewed from Hematology.  B12 given at last visit.  Repeat CBC drawn at visit today.  Follow up in 1 month.  Call sooner if concerns arise.

## 2022-09-07 ENCOUNTER — Ambulatory Visit: Payer: Self-pay | Admitting: *Deleted

## 2022-09-07 DIAGNOSIS — M328 Other forms of systemic lupus erythematosus: Secondary | ICD-10-CM | POA: Diagnosis not present

## 2022-09-07 DIAGNOSIS — L97812 Non-pressure chronic ulcer of other part of right lower leg with fat layer exposed: Secondary | ICD-10-CM | POA: Diagnosis not present

## 2022-09-07 DIAGNOSIS — D513 Other dietary vitamin B12 deficiency anemia: Secondary | ICD-10-CM | POA: Diagnosis not present

## 2022-09-07 DIAGNOSIS — L98493 Non-pressure chronic ulcer of skin of other sites with necrosis of muscle: Secondary | ICD-10-CM | POA: Diagnosis not present

## 2022-09-07 LAB — CBC WITH DIFFERENTIAL/PLATELET
Basophils Absolute: 0.1 10*3/uL (ref 0.0–0.2)
Basos: 1 %
EOS (ABSOLUTE): 0 10*3/uL (ref 0.0–0.4)
Eos: 0 %
Hematocrit: 25.2 % — ABNORMAL LOW (ref 34.0–46.6)
Hemoglobin: 8.6 g/dL — ABNORMAL LOW (ref 11.1–15.9)
Immature Grans (Abs): 0 10*3/uL (ref 0.0–0.1)
Immature Granulocytes: 0 %
Lymphocytes Absolute: 0.6 10*3/uL — ABNORMAL LOW (ref 0.7–3.1)
Lymphs: 5 %
MCH: 33.3 pg — ABNORMAL HIGH (ref 26.6–33.0)
MCHC: 34.1 g/dL (ref 31.5–35.7)
MCV: 98 fL — ABNORMAL HIGH (ref 79–97)
Monocytes Absolute: 0.6 10*3/uL (ref 0.1–0.9)
Monocytes: 6 %
Neutrophils Absolute: 9.7 10*3/uL — ABNORMAL HIGH (ref 1.4–7.0)
Neutrophils: 88 %
Platelets: 758 10*3/uL — ABNORMAL HIGH (ref 150–450)
RBC: 2.58 x10E6/uL — CL (ref 3.77–5.28)
RDW: 11.9 % (ref 11.7–15.4)
WBC: 11 10*3/uL — ABNORMAL HIGH (ref 3.4–10.8)

## 2022-09-07 MED ORDER — CYANOCOBALAMIN 1000 MCG/ML IJ SOLN
1000.0000 ug | INTRAMUSCULAR | Status: AC
  Administered 2022-09-10 – 2023-01-02 (×3): 1000 ug via INTRAMUSCULAR

## 2022-09-07 MED ORDER — CYANOCOBALAMIN 1000 MCG/ML IJ SOLN
1000.0000 ug | INTRAMUSCULAR | Status: AC
  Administered 2022-09-17: 1000 ug via INTRAMUSCULAR

## 2022-09-07 NOTE — Progress Notes (Signed)
SMT, LOKEY [name] ([id number]) [id number] Page 1 of 7 Visit Report for 09/06/2022 Arrival Information Details Patient Name: Date of Service: [name] 09/06/2022 3:45 PM Medical Record Number: [id number] Patient Account Number: [id number] Date of Birth/Sex: Treating RN: 08-May-1956 (66 y.o. [name], [name] Primary Care Jacqlyn Marolf: Jon Billings Other Clinician: Massie Kluver Referring Avory Mimbs: Treating Niles Ess/Extender: Venia Minks in Treatment: 1 Visit Information History Since Last Visit All ordered tests and consults were completed: No Patient Arrived: Ambulatory Added or deleted any medications: No Arrival Time: 16:53 Any new allergies or adverse reactions: No Transfer Assistance: None Had a fall or experienced change in No Patient Requires Transmission-Based Precautions: No activities of daily living that may affect Patient Has Alerts: Yes risk of falls: Patient Alerts: NOT Diabetic Signs or symptoms of abuse/neglect since last visito No Hospitalized since last visit: No Implantable device outside of the clinic excluding No cellular tissue based products placed in the center since last visit: Pain Present Now: No Electronic Signature(s) Signed: 09/06/2022 5:06:55 PM By: Massie Kluver Entered By: Massie Kluver on 09/06/2022 16:53:40 -------------------------------------------------------------------------------- Clinic Level of Care Assessment Details Patient Name: Date of Service: [name] 09/06/2022 3:45 PM Medical Record Number: [id number] Patient Account Number: [id number] Date of Birth/Sex: Treating RN: Oct 22, 1956 (66 y.o. [name] Primary Care Ory Elting: Jon Billings Other Clinician: Massie Kluver Referring Jerrel Tiberio: Treating Ivy Meriwether/Extender: Venia Minks in Treatment: 1 Clinic Level of Care Assessment Items TOOL 4 Quantity Score '[]'$  - 0 Use  when only an EandM is performed on FOLLOW-UP visit ASSESSMENTS - Nursing Assessment / Reassessment X- 1 10 Reassessment of Co-morbidities (includes updates in patient status) X- 1 5 Reassessment of Adherence to Treatment Plan [name], [name] ([id number]) [id number] Page 2 of 7 ASSESSMENTS - Wound and Skin A ssessment / Reassessment '[]'$  - Simple Wound Assessment / Reassessment - one wound 0 X- 2 5 Complex Wound Assessment / Reassessment - multiple wounds '[]'$  - 0 Dermatologic / Skin Assessment (not related to wound area) ASSESSMENTS - Focused Assessment '[]'$  - 0 Circumferential Edema Measurements - multi extremities '[]'$  - 0 Nutritional Assessment / Counseling / Intervention '[]'$  - 0 Lower Extremity Assessment (monofilament, tuning fork, pulses) '[]'$  - 0 Peripheral Arterial Disease Assessment (using hand held doppler) ASSESSMENTS - Ostomy and/or Continence Assessment and Care '[]'$  - 0 Incontinence Assessment and Management '[]'$  - 0 Ostomy Care Assessment and Management (repouching, etc.) PROCESS - Coordination of Care X - Simple Patient / Family Education for ongoing care 1 15 '[]'$  - 0 Complex (extensive) Patient / Family Education for ongoing care '[]'$  - 0 Staff obtains Programmer, systems, Records, T Results / Process Orders est '[]'$  - 0 Staff telephones HHA, Nursing Homes / Clarify orders / etc '[]'$  - 0 Routine Transfer to another Facility (non-emergent condition) '[]'$  - 0 Routine Hospital Admission (non-emergent condition) '[]'$  - 0 New Admissions / Biomedical engineer / Ordering NPWT Apligraf, etc. , '[]'$  - 0 Emergency Hospital Admission (emergent condition) X- 1 10 Simple Discharge Coordination '[]'$  - 0 Complex (extensive) Discharge Coordination PROCESS - Special Needs '[]'$  - 0 Pediatric / Minor Patient Management '[]'$  - 0 Isolation Patient Management '[]'$  - 0 Hearing / Language / Visual special needs '[]'$  - 0 Assessment of Community assistance (transportation, D/C  planning, etc.) '[]'$  - 0 Additional assistance / Altered mentation '[]'$  - 0 Support Surface(s) Assessment (bed, cushion, seat, etc.) INTERVENTIONS - Wound Cleansing / Measurement '[]'$  - 0 Simple Wound Cleansing - one wound  X- 2 5 Complex Wound Cleansing - multiple wounds '[]'$  - 0 Wound Imaging (photographs - any number of wounds) '[]'$  - 0 Wound Tracing (instead of photographs) '[]'$  - 0 Simple Wound Measurement - one wound '[]'$  - 0 Complex Wound Measurement - multiple wounds INTERVENTIONS - Wound Dressings '[]'$  - 0 Small Wound Dressing one or multiple wounds X- 2 15 Medium Wound Dressing one or multiple wounds '[]'$  - 0 Large Wound Dressing one or multiple wounds '[]'$  - 0 Application of Medications - topical '[]'$  - 0 Application of Medications - injection INTERVENTIONS - Miscellaneous '[]'$  - 0 External ear exam [name], [name] ([id number]) [id number] Page 3 of 7 '[]'$  - 0 Specimen Collection (cultures, biopsies, blood, body fluids, etc.) '[]'$  - 0 Specimen(s) / Culture(s) sent or taken to Lab for analysis '[]'$  - 0 Patient Transfer (multiple staff / Harrel Lemon Lift / Similar devices) '[]'$  - 0 Simple Staple / Suture removal (25 or less) '[]'$  - 0 Complex Staple / Suture removal (26 or more) '[]'$  - 0 Hypo / Hyperglycemic Management (close monitor of Blood Glucose) '[]'$  - 0 Ankle / Brachial Index (ABI) - do not check if billed separately '[]'$  - 0 Vital Signs Has the patient been seen at the hospital within the last three years: Yes Total Score: 90 Level Of Care: New/Established - Level 3 Electronic Signature(s) Signed: 09/06/2022 5:06:55 PM By: Massie Kluver Entered By: Massie Kluver on 09/06/2022 16:55:57 -------------------------------------------------------------------------------- Encounter Discharge Information Details Patient Name: Date of Service: [name]. 09/06/2022 3:45 PM Medical Record Number: [id number] Patient Account Number: [id number] Date of  Birth/Sex: Treating RN: 1956-07-22 (66 y.o. [name] Primary Care Bernese Doffing: Jon Billings Other Clinician: Massie Kluver Referring Verina Galeno: Treating Vasco Chong/Extender: Venia Minks in Treatment: 1 Encounter Discharge Information Items Discharge Condition: Stable Ambulatory Status: Ambulatory Discharge Destination: Home Transportation: Private Auto Accompanied By: self Schedule Follow-up Appointment: Yes Clinical Summary of Care: Electronic Signature(s) Signed: 09/06/2022 5:06:55 PM By: Massie Kluver Entered By: Massie Kluver on 09/06/2022 16:55:21 -------------------------------------------------------------------------------- Wound Assessment Details Patient Name: Date of Service: [name]. 09/06/2022 3:45 PM [name] ([id number]) [id number] Page 4 of 7 Medical Record Number: [id number] Patient Account Number: [id number] Date of Birth/Sex: Treating RN: [birth year] (66 y.o. [name], [name] Primary Care Allard Lightsey: Jon Billings Other Clinician: Massie Kluver Referring Garett Tetzloff: Treating Eun Vermeer/Extender: Venia Minks in Treatment: 1 Wound Status Wound Number: 1 Primary Trauma, Other Etiology: Wound Location: Left, Proximal Upper Leg Wound Open Wounding Event: Trauma Status: Date Acquired: 08/11/2022 Comorbid Anemia, Hypertension, Lupus Erythematosus, Osteoarthritis, Weeks Of Treatment: 1 History: Received Radiation Clustered Wound: Yes Wound Measurements Length: (cm) 1.5 Width: (cm) 0.5 Depth: (cm) 0.2 Area: (cm) 0.589 Volume: (cm) 0.118 % Reduction in Area: 96.3% % Reduction in Volume: 96.2% Epithelialization: None Wound Description Classification: Full Thickness Without Exposed Suppor Exudate Amount: Medium Exudate Type: Serous Exudate Color: amber t Structures Foul Odor After Cleansing: No Slough/Fibrino Yes Wound Bed Granulation Amount: Small  (1-33%) Exposed Structure Granulation Quality: Pink Fascia Exposed: No Necrotic Amount: Large (67-100%) Fat Layer (Subcutaneous Tissue) Exposed: Yes Necrotic Quality: Adherent Slough Tendon Exposed: No Muscle Exposed: No Joint Exposed: No Bone Exposed: No Treatment Notes Wound #1 (Upper Leg) Wound Laterality: Left, Proximal Cleanser Peri-Wound Care Skin Prep Discharge Instruction: Use skin prep as directed Topical Primary Dressing Xeroform 4x4-HBD (in/in) Discharge Instruction: Apply Xeroform 4x4-HBD (in/in) as directed Secondary Dressing Coverlet Latex-Free Fabric Adhesive Dressings Discharge Instruction: 1.5 x 2 Secured With Compression  Wrap Compression Stockings Add-Ons Electronic Signature(s) Signed: 09/06/2022 5:06:55 PM By: Massie Kluver Signed: 09/07/2022 7:25:15 AM By: Gretta Cool, BSN, RN, CWS, [name] RN, BSN Entered By: Massie Kluver on 09/06/2022 16:54:28 [name], [name] ([id number]) [id number] Page 5 of 7 -------------------------------------------------------------------------------- Wound Assessment Details Patient Name: Date of Service: [name] 09/06/2022 3:45 PM Medical Record Number: [id number] Patient Account Number: [id number] Date of Birth/Sex: Treating RN: 29-Aug-1956 (66 y.o. [name], [name] Primary Care Ewan Grau: Jon Billings Other Clinician: Massie Kluver Referring Carron Jaggi: Treating Luetta Piazza/Extender: Venia Minks in Treatment: 1 Wound Status Wound Number: 2 Primary Trauma, Other Etiology: Wound Location: Left, Distal Upper Leg Wound Open Wounding Event: Trauma Status: Date Acquired: 08/11/2022 Comorbid Anemia, Hypertension, Lupus Erythematosus, Osteoarthritis, Weeks Of Treatment: 1 History: Received Radiation Clustered Wound: No Wound Measurements Length: (cm) 11 Width: (cm) 12 Depth: (cm) 2 Area: (cm) 103.673 Volume: (cm) 207.345 % Reduction in Area: -4.8% %  Reduction in Volume: 58.1% Epithelialization: None Wound Description Classification: Full Thickness With Exposed Suppo Exudate Amount: Medium Exudate Type: Serosanguineous Exudate Color: red, brown rt Structures Wound Bed Granulation Amount: Medium (34-66%) Exposed Structure Granulation Quality: Pink Fascia Exposed: No Necrotic Amount: Medium (34-66%) Fat Layer (Subcutaneous Tissue) Exposed: Yes Necrotic Quality: Adherent Slough Tendon Exposed: No Muscle Exposed: No Joint Exposed: No Bone Exposed: No Treatment Notes Wound #2 (Upper Leg) Wound Laterality: Left, Distal Cleanser Peri-Wound Care Skin Prep Discharge Instruction: Use skin prep as directed Topical Primary Dressing Gauze Discharge Instruction: moistened with Dakins Solution hydrofera blue rope Discharge Instruction: insert into tunnel area at 12 oclock Secondary Dressing ABD Pad 5x9 (in/in) Discharge Instruction: Cover with ABD pad Secured With [name], [name] ([id number]) [id number] Page 6 of 7 Medipore T - 43M Medipore H Soft Cloth Surgical T ape ape, 2x2 (in/yd) Compression Wrap Compression Stockings Add-Ons Electronic Signature(s) Signed: 09/06/2022 5:06:55 PM By: Massie Kluver Signed: 09/07/2022 7:25:15 AM By: Gretta Cool, BSN, RN, CWS, [name] RN, BSN Entered By: Massie Kluver on 09/06/2022 16:54:34 -------------------------------------------------------------------------------- Wound Assessment Details Patient Name: Date of Service: [name]. 09/06/2022 3:45 PM Medical Record Number: [id number] Patient Account Number: [id number] Date of Birth/Sex: Treating RN: 09/11/1956 (65 y.o. [name], [name] Primary Care Caci Orren: Jon Billings Other Clinician: Massie Kluver Referring Wynnie Pacetti: Treating Marciana Uplinger/Extender: Venia Minks in Treatment: 1 Wound Status Wound Number: 3 Primary Trauma, Other Etiology: Wound Location: Right Knee Wound  Open Wounding Event: Trauma Status: Date Acquired: 08/11/2022 Comorbid Anemia, Hypertension, Lupus Erythematosus, Osteoarthritis, Weeks Of Treatment: 1 History: Received Radiation Clustered Wound: No Wound Measurements Length: (cm) 3.6 Width: (cm) 6.5 Depth: (cm) 1 Area: (cm) 18.378 Volume: (cm) 18.378 % Reduction in Area: -56% % Reduction in Volume: 8.2% Epithelialization: None Wound Description Classification: Full Thickness Without Exposed Support Exudate Amount: Large Exudate Type: Serosanguineous Exudate Color: red, brown Structures Foul Odor After Cleansing: No Slough/Fibrino Yes Wound Bed Granulation Amount: None Present (0%) Exposed Structure Necrotic Amount: Large (67-100%) Fascia Exposed: No Necrotic Quality: Eschar Fat Layer (Subcutaneous Tissue) Exposed: Yes Tendon Exposed: No Muscle Exposed: No Joint Exposed: No Bone Exposed: No Treatment Notes Wound #3 (Knee) Wound Laterality: Right Cleanser Peri-Wound Care Skin Prep Discharge Instruction: Use skin prep as directed [name], THEDFORD [name] ([id number]) [id number] Page 7 of 7 Topical Primary Dressing Gauze Discharge Instruction: moistened with Dakins Solution Secondary Dressing (BORDER) Zetuvit Plus SILICONE BORDER Dressing 5x5 (in/in) Discharge Instruction: Please do not put silicone bordered dressings under wraps. Use non-bordered dressing only. Secured With Compression Wrap Compression Stockings  Add-Ons Electronic Signature(s) Signed: 09/06/2022 5:06:55 PM By: Massie Kluver Signed: 09/07/2022 7:25:15 AM By: Gretta Cool, BSN, RN, CWS, [name] RN, BSN Entered By: Massie Kluver on 09/06/2022 16:54:38

## 2022-09-07 NOTE — Progress Notes (Signed)
CHIQUITTA, MATTY I ([id number]) [id number] Page 1 of 7 Visit Report for 09/07/2022 Arrival Information Details Patient Name: Date of Service: Kristine Mater I. 09/07/2022 10:00 A M Medical Record Number: [id number] Patient Account Number: [id number] Date of Birth/Sex: Treating RN: August 07, 1956 (66 y.o. [name] Primary Care Briannia Laba: Jon Billings Other Clinician: Referring [name]: Treating Lilit Cinelli/Extender: Venia Minks in Treatment: 2 Visit Information History Since Last Visit Added or deleted any medications: No Patient Arrived: Ambulatory Any new allergies or adverse reactions: No Arrival Time: 10:12 Had a fall or experienced change in No Accompanied By: self activities of daily living that may affect Transfer Assistance: None risk of falls: Patient Identification Verified: Yes Hospitalized since last visit: No Secondary Verification Process Completed: Yes Pain Present Now: No Patient Requires Transmission-Based Precautions: No Patient Has Alerts: Yes Patient Alerts: NOT Diabetic Electronic Signature(s) Signed: 09/07/2022 1:24:40 PM By: Rosalio Loud MSN RN CNS WTA Entered By: Rosalio Loud on 09/07/2022 10:13:28 -------------------------------------------------------------------------------- Clinic Level of Care Assessment Details Patient Name: Date of Service: Kristine Mater I. 09/07/2022 10:00 A M Medical Record Number: [id number] Patient Account Number: [id number] Date of Birth/Sex: Treating RN: [birth year] (66 y.o. [name] Primary Care Donelda Mailhot: Jon Billings Other Clinician: Referring Adalid Beckmann: Treating [name]/Extender: Venia Minks in Treatment: 2 Clinic Level of Care Assessment Items TOOL 4 Quantity Score X- 1 0 Use when only an EandM is performed on FOLLOW-UP visit ASSESSMENTS - Nursing Assessment / Reassessment X- 1 10 Reassessment of Co-morbidities  (includes updates in patient status) X- 1 5 Reassessment of Adherence to Treatment Plan ASSESSMENTS - Wound and Skin A ssessment / Reassessment '[]'$  - 0 Simple Wound Assessment / Reassessment - one wound Kristine, [name] I ([id number]) [phone].pdf Page 2 of 7 X- 3 5 Complex Wound Assessment / Reassessment - multiple wounds '[]'$  - 0 Dermatologic / Skin Assessment (not related to wound area) ASSESSMENTS - Focused Assessment '[]'$  - 0 Circumferential Edema Measurements - multi extremities '[]'$  - 0 Nutritional Assessment / Counseling / Intervention '[]'$  - 0 Lower Extremity Assessment (monofilament, tuning fork, pulses) '[]'$  - 0 Peripheral Arterial Disease Assessment (using hand held doppler) ASSESSMENTS - Ostomy and/or Continence Assessment and Care '[]'$  - 0 Incontinence Assessment and Management '[]'$  - 0 Ostomy Care Assessment and Management (repouching, etc.) PROCESS - Coordination of Care '[]'$  - 0 Simple Patient / Family Education for ongoing care '[]'$  - 0 Complex (extensive) Patient / Family Education for ongoing care X- 1 10 Staff obtains Programmer, systems, Records, T Results / Process Orders est '[]'$  - 0 Staff telephones HHA, Nursing Homes / Clarify orders / etc '[]'$  - 0 Routine Transfer to another Facility (non-emergent condition) '[]'$  - 0 Routine Hospital Admission (non-emergent condition) '[]'$  - 0 New Admissions / Biomedical engineer / Ordering NPWT Apligraf, etc. , '[]'$  - 0 Emergency Hospital Admission (emergent condition) '[]'$  - 0 Simple Discharge Coordination X- 1 15 Complex (extensive) Discharge Coordination PROCESS - Special Needs '[]'$  - 0 Pediatric / Minor Patient Management '[]'$  - 0 Isolation Patient Management '[]'$  - 0 Hearing / Language / Visual special needs '[]'$  - 0 Assessment of Community assistance (transportation, D/C planning, etc.) '[]'$  - 0 Additional assistance / Altered mentation '[]'$  - 0 Support Surface(s) Assessment (bed, cushion, seat,  etc.) INTERVENTIONS - Wound Cleansing / Measurement '[]'$  - 0 Simple Wound Cleansing - one wound X- 3 5 Complex Wound Cleansing - multiple wounds '[]'$  - 0 Wound Imaging (photographs - any number of wounds) '[]'$  - 0  Wound Tracing (instead of photographs) '[]'$  - 0 Simple Wound Measurement - one wound X- 3 5 Complex Wound Measurement - multiple wounds INTERVENTIONS - Wound Dressings '[]'$  - 0 Small Wound Dressing one or multiple wounds X- 3 15 Medium Wound Dressing one or multiple wounds '[]'$  - 0 Large Wound Dressing one or multiple wounds X- 1 5 Application of Medications - topical '[]'$  - 0 Application of Medications - injection INTERVENTIONS - Miscellaneous '[]'$  - 0 External ear exam '[]'$  - 0 Specimen Collection (cultures, biopsies, blood, body fluids, etc.) '[]'$  - 0 Specimen(s) / Culture(s) sent or taken to Lab for analysis Kristine, [name] I ([id number]) [id number] Page 3 of 7 '[]'$  - 0 Patient Transfer (multiple staff / Civil Service fast streamer / Similar devices) '[]'$  - 0 Simple Staple / Suture removal (25 or less) '[]'$  - 0 Complex Staple / Suture removal (26 or more) '[]'$  - 0 Hypo / Hyperglycemic Management (close monitor of Blood Glucose) '[]'$  - 0 Ankle / Brachial Index (ABI) - do not check if billed separately '[]'$  - 0 Vital Signs Has the patient been seen at the hospital within the last three years: Yes Total Score: 135 Level Of Care: New/Established - Level 4 Electronic Signature(s) Signed: 09/07/2022 1:24:40 PM By: Rosalio Loud MSN RN CNS WTA Entered By: Rosalio Loud on 09/07/2022 10:43:34 -------------------------------------------------------------------------------- Encounter Discharge Information Details Patient Name: Date of Service: Kristine Mater I. 09/07/2022 10:00 A M Medical Record Number: [id number] Patient Account Number: [id number] Date of Birth/Sex: Treating RN: 1955/11/20 (66 y.o. [name] Primary Care Lark Langenfeld: Jon Billings Other  Clinician: Referring Ethelene Closser: Treating Gradie Butrick/Extender: Venia Minks in Treatment: 2 Encounter Discharge Information Items Discharge Condition: Stable Ambulatory Status: Ambulatory Discharge Destination: Home Transportation: Private Auto Accompanied By: self Schedule Follow-up Appointment: Yes Clinical Summary of Care: Electronic Signature(s) Signed: 09/07/2022 1:24:40 PM By: Rosalio Loud MSN RN CNS WTA Entered By: Rosalio Loud on 09/07/2022 10:42:00 -------------------------------------------------------------------------------- Wound Assessment Details Patient Name: Date of Service: Kristine Mater I. 09/07/2022 10:00 A M Medical Record Number: [id number] Patient Account Number: [id number] Date of Birth/Sex: Treating RN: Aug 14, 1956 (66 y.o. [name] Primary Care Zamir Staples: Jon Billings Other Clinician: PRITI, CONSOLI I ([id number]) [id number] Page 4 of 7 Referring Ollin Hochmuth: Treating Ishan Sanroman/Extender: Venia Minks in Treatment: 2 Wound Status Wound Number: 1 Primary Trauma, Other Etiology: Wound Location: Left, Proximal Upper Leg Wound Open Wounding Event: Trauma Status: Date Acquired: 08/11/2022 Comorbid Anemia, Hypertension, Lupus Erythematosus, Osteoarthritis, Weeks Of Treatment: 2 History: Received Radiation Clustered Wound: Yes Wound Measurements Length: (cm) 0.2 Width: (cm) 0.2 Depth: (cm) 0.1 Area: (cm) 0.031 Volume: (cm) 0.003 % Reduction in Area: 99.8% % Reduction in Volume: 99.9% Epithelialization: None Wound Description Classification: Full Thickness Without Exposed Suppor Exudate Amount: Medium Exudate Type: Serous Exudate Color: amber t Structures Foul Odor After Cleansing: No Slough/Fibrino Yes Wound Bed Granulation Amount: Small (1-33%) Exposed Structure Granulation Quality: Pink Fascia Exposed: No Necrotic Amount: Large (67-100%) Fat Layer  (Subcutaneous Tissue) Exposed: Yes Necrotic Quality: Adherent Slough Tendon Exposed: No Muscle Exposed: No Joint Exposed: No Bone Exposed: No Treatment Notes Wound #1 (Upper Leg) Wound Laterality: Left, Proximal Cleanser Peri-Wound Care Skin Prep Discharge Instruction: Use skin prep as directed Topical Primary Dressing Xeroform 4x4-HBD (in/in) Discharge Instruction: Apply Xeroform 4x4-HBD (in/in) as directed Secondary Dressing Coverlet Latex-Free Fabric Adhesive Dressings Discharge Instruction: 1.5 x 2 Secured With Compression Wrap Compression Stockings Add-Ons Electronic Signature(s) Signed: 09/07/2022 1:24:40 PM By: Rosalio Loud MSN RN CNS WTA  Entered By: Rosalio Loud on 09/07/2022 10:41:04 Allayne Stack I ([id number]) [id number] Page 5 of 7 -------------------------------------------------------------------------------- Wound Assessment Details Patient Name: Date of Service: Kristine Mater I. 09/07/2022 10:00 A M Medical Record Number: [id number] Patient Account Number: [id number] Date of Birth/Sex: Treating RN: 30-Nov-1955 (66 y.o. [name] Primary Care Yiselle Babich: Jon Billings Other Clinician: Referring Zeferino Mounts: Treating Matisse Roskelley/Extender: Venia Minks in Treatment: 2 Wound Status Wound Number: 2 Primary Trauma, Other Etiology: Wound Location: Left, Distal Upper Leg Wound Open Wounding Event: Trauma Status: Date Acquired: 08/11/2022 Comorbid Anemia, Hypertension, Lupus Erythematosus, Osteoarthritis, Weeks Of Treatment: 2 History: Received Radiation Clustered Wound: No Wound Measurements Length: (cm) 8.2 Width: (cm) 11.5 Depth: (cm) 1.5 Area: (cm) 74.063 Volume: (cm) 111.095 % Reduction in Area: 25.2% % Reduction in Volume: 77.5% Epithelialization: None Wound Description Classification: Full Thickness With Exposed Suppo Exudate Amount: Medium Exudate Type: Serosanguineous Exudate  Color: red, brown rt Structures Wound Bed Granulation Amount: Medium (34-66%) Exposed Structure Granulation Quality: Pink Fascia Exposed: No Necrotic Amount: Medium (34-66%) Fat Layer (Subcutaneous Tissue) Exposed: Yes Necrotic Quality: Adherent Slough Tendon Exposed: No Muscle Exposed: No Joint Exposed: No Bone Exposed: No Treatment Notes Wound #2 (Upper Leg) Wound Laterality: Left, Distal Cleanser Peri-Wound Care Skin Prep Discharge Instruction: Use skin prep as directed Topical Primary Dressing Gauze Discharge Instruction: moistened with Dakins Solution hydrofera blue rope Discharge Instruction: insert into tunnel area at 12 oclock Secondary Dressing ABD Pad 5x9 (in/in) Discharge Instruction: Cover with ABD pad Secured With Dior I ([id number]) [id number] Page 6 of 7 Medipore T - 57M Medipore H Soft Cloth Surgical T ape ape, 2x2 (in/yd) Compression Wrap Compression Stockings Add-Ons Electronic Signature(s) Signed: 09/07/2022 1:24:40 PM By: Rosalio Loud MSN RN CNS WTA Entered By: Rosalio Loud on 09/07/2022 10:41:09 -------------------------------------------------------------------------------- Wound Assessment Details Patient Name: Date of Service: Kristine Mater I. 09/07/2022 10:00 A M Medical Record Number: [id number] Patient Account Number: [id number] Date of Birth/Sex: Treating RN: 1955-12-18 (66 y.o. [name] Primary Care Huberta Tompkins: Jon Billings Other Clinician: Referring Suetta Hoffmeister: Treating Dione Mccombie/Extender: Venia Minks in Treatment: 2 Wound Status Wound Number: 3 Primary Trauma, Other Etiology: Wound Location: Right Knee Wound Open Wounding Event: Trauma Status: Date Acquired: 08/11/2022 Comorbid Anemia, Hypertension, Lupus Erythematosus, Osteoarthritis, Weeks Of Treatment: 2 History: Received Radiation Clustered Wound: No Wound Measurements Length: (cm) 3.8 Width: (cm)  6.4 Depth: (cm) 0.9 Area: (cm) 19.101 Volume: (cm) 17.191 % Reduction in Area: -62.1% % Reduction in Volume: 14.2% Epithelialization: None Wound Description Classification: Full Thickness Without Exposed Support Exudate Amount: Large Exudate Type: Serosanguineous Exudate Color: red, brown Structures Foul Odor After Cleansing: No Slough/Fibrino Yes Wound Bed Granulation Amount: None Present (0%) Exposed Structure Necrotic Amount: Large (67-100%) Fascia Exposed: No Necrotic Quality: Eschar Fat Layer (Subcutaneous Tissue) Exposed: Yes Tendon Exposed: No Muscle Exposed: No Joint Exposed: No Bone Exposed: No Treatment Notes Wound #3 (Knee) Wound Laterality: Right Cleanser Peri-Wound Care Skin Prep Discharge Instruction: Use skin prep as directed Topical CAYLAH, PLOUFF I ([id number]) [id number] Page 7 of 7 Primary Dressing Gauze Discharge Instruction: moistened with Dakins Solution Secondary Dressing (BORDER) Zetuvit Plus SILICONE BORDER Dressing 5x5 (in/in) Discharge Instruction: Please do not put silicone bordered dressings under wraps. Use non-bordered dressing only. Secured With Compression Wrap Compression Stockings Environmental education officer) Signed: 09/07/2022 1:24:40 PM By: Rosalio Loud MSN RN CNS WTA Entered By: Rosalio Loud on 09/07/2022 10:41:14

## 2022-09-07 NOTE — Progress Notes (Signed)
[name], [facility] I ([id number]) [id number] Page 1 of 2 Visit Report for 09/07/2022 Physician Orders Details Patient Name: Date of Service: Kristine Mater I. 09/07/2022 10:00 A M Medical Record Number: [id number] Patient Account Number: [id number] Date of Birth/Sex: Treating RN: 21-Jul-1956 (66 y.o. [name] Primary Care Provider: Jon Billings Other Clinician: Referring Provider: Treating Provider/Extender: Venia Minks in Treatment: 2 Verbal / Phone Orders: No Diagnosis Coding Follow-up Appointments Return Appointment in 1 week. Anesthetic (Use 'Patient Medications' Section for Anesthetic Order Entry) Lidocaine applied to wound bed Edema Control - Lymphedema / Segmental Compressive Device / Other Elevate, Exercise Daily and A void Standing for Long Periods of Time. Elevate legs to the level of the heart and pump ankles as often as possible Elevate leg(s) parallel to the floor when sitting. Medications-Please add to medication list. ntibiotics - continue antibiotics P.O. A Wound Treatment Wound #1 - Upper Leg Wound Laterality: Left, Proximal Peri-Wound Care: Skin Prep 1 x Per Day/30 Days Discharge Instructions: Use skin prep as directed Prim Dressing: Xeroform 4x4-HBD (in/in) 1 x Per Day/30 Days ary Discharge Instructions: Apply Xeroform 4x4-HBD (in/in) as directed Secondary Dressing: Coverlet Latex-Free Fabric Adhesive Dressings 1 x Per Day/30 Days Discharge Instructions: 1.5 x 2 Wound #2 - Upper Leg Wound Laterality: Left, Distal Peri-Wound Care: Skin Prep 1 x Per Day/30 Days Discharge Instructions: Use skin prep as directed Prim Dressing: Gauze 1 x Per Day/30 Days ary Discharge Instructions: moistened with Dakins Solution Prim Dressing: hydrofera blue rope 1 x Per Day/30 Days ary Discharge Instructions: insert into tunnel area at 12 oclock Secondary Dressing: ABD Pad 5x9 (in/in) 1 x Per Day/30  Days Discharge Instructions: Cover with ABD pad Secured With: Medipore T - 58M Medipore H Soft Cloth Surgical T ape ape, 2x2 (in/yd) 1 x Per Day/30 Days Wound #3 - Knee Wound Laterality: Right Peri-Wound Care: Skin Prep 1 x Per Day/30 Days Discharge Instructions: Use skin prep as directed Prim Dressing: Gauze 1 x Per Day/30 Days ary Discharge Instructions: moistened with Dakins Solution Secondary Dressing: (BORDER) Zetuvit Plus SILICONE BORDER Dressing 5x5 (in/in) 1 x Per Day/30 Days AVE, [place] I ([id number]) [id number] Page 2 of 2 Discharge Instructions: Please do not put silicone bordered dressings under wraps. Use non-bordered dressing only. Electronic Signature(s) Signed: 09/07/2022 1:24:40 PM By: Rosalio Loud MSN RN CNS WTA Signed: 09/07/2022 2:32:55 PM By: [name] PA-C Entered By: Rosalio Loud on 09/07/2022 10:41:32 -------------------------------------------------------------------------------- SuperBill Details Patient Name: Date of Service: Kristine Mater I. 09/07/2022 Medical Record Number: [id number] Patient Account Number: [id number] Date of Birth/Sex: Treating RN: 1956-09-16 (66 y.o. 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

## 2022-09-07 NOTE — Progress Notes (Signed)
ANNETA, ROUNDS [name] ([id number]) [id number] Page 1 of 2 Visit Report for 09/06/2022 Physician Orders Details Patient Name: Date of Service: [name] 09/06/2022 3:45 PM Medical Record Number: [id number] Patient Account Number: [id number] Date of Birth/Sex: Treating RN: 24-May-1956 (66 y.o. [name] Primary Care Provider: Jon Billings Other Clinician: Massie Kluver Referring Provider: Treating Provider/Extender: Venia Minks in Treatment: 1 Verbal / Phone Orders: No Diagnosis Coding Follow-up Appointments Return Appointment in 1 week. Anesthetic (Use 'Patient Medications' Section for Anesthetic Order Entry) Lidocaine applied to wound bed Edema Control - Lymphedema / Segmental Compressive Device / Other Elevate, Exercise Daily and A void Standing for Long Periods of Time. Elevate legs to the level of the heart and pump ankles as often as possible Elevate leg(s) parallel to the floor when sitting. Medications-Please add to medication list. ntibiotics - continue antibiotics P.O. A Wound Treatment Wound #1 - Upper Leg Wound Laterality: Left, Proximal Peri-Wound Care: Skin Prep 1 x Per Day/30 Days Discharge Instructions: Use skin prep as directed Prim Dressing: Xeroform 4x4-HBD (in/in) 1 x Per Day/30 Days ary Discharge Instructions: Apply Xeroform 4x4-HBD (in/in) as directed Secondary Dressing: Coverlet Latex-Free Fabric Adhesive Dressings 1 x Per Day/30 Days Discharge Instructions: 1.5 x 2 Wound #2 - Upper Leg Wound Laterality: Left, Distal Peri-Wound Care: Skin Prep 1 x Per Day/30 Days Discharge Instructions: Use skin prep as directed Prim Dressing: Gauze 1 x Per Day/30 Days ary Discharge Instructions: moistened with Dakins Solution Prim Dressing: hydrofera blue rope 1 x Per Day/30 Days ary Discharge Instructions: insert into tunnel area at 12 oclock Secondary Dressing: ABD Pad 5x9 (in/in) 1 x Per Day/30  Days Discharge Instructions: Cover with ABD pad Secured With: Medipore T - 26M Medipore H Soft Cloth Surgical T ape ape, 2x2 (in/yd) 1 x Per Day/30 Days Wound #3 - Knee Wound Laterality: Right Peri-Wound Care: Skin Prep 1 x Per Day/30 Days Discharge Instructions: Use skin prep as directed Prim Dressing: Gauze 1 x Per Day/30 Days ary Discharge Instructions: moistened with Dakins Solution Secondary Dressing: (BORDER) Zetuvit Plus SILICONE BORDER Dressing 5x5 (in/in) 1 x Per Day/30 Days [name], [name] ([id number]) [id number] Page 2 of 2 Discharge Instructions: Please do not put silicone bordered dressings under wraps. Use non-bordered dressing only. Electronic Signature(s) Signed: 09/06/2022 5:06:55 PM By: Massie Kluver Signed: 09/07/2022 2:32:55 PM By: [name] PA-C Entered By: Massie Kluver on 09/06/2022 16:54:53 -------------------------------------------------------------------------------- SuperBill Details Patient Name: Date of Service: Kristine Mater [name]. 09/06/2022 Medical Record Number: [id number] Patient Account Number: [id number] Date of Birth/Sex: Treating RN: 16-Apr-1956 (66 y.o. 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

## 2022-09-07 NOTE — Progress Notes (Signed)
Please let patient know that her Red blood cells are still low.  However, her Hemoglobin and hematocrit improved. At this point, we should do B12 injections weekly for 1 month and then change to monthly for 4 months and see how patient does.  We will recheck her CBC at her next visit.  These injections can be done at nurse visits.

## 2022-09-07 NOTE — Progress Notes (Signed)
AZAYLEA, MAVES I ([id number]) [id number] Page 1 of 6 Visit Report for 09/05/2022 Arrival Information Details Patient Name: Date of Service: Kristine Mater I. 09/05/2022 11:15 A M Medical Record Number: [id number] Patient Account Number: [id number] Date of Birth/Sex: Treating RN: [birth year] (66 y.o. [name] Primary Care Lacorey Brusca: Jon Billings Other Clinician: Massie Kluver Referring Xylia Scherger: Treating Rodderick Holtzer/Extender: Jomarie Longs in Treatment: 1 Visit Information History Since Last Visit All ordered tests and consults were completed: No Patient Arrived: Ambulatory Added or deleted any medications: No Arrival Time: 11:49 Any new allergies or adverse reactions: No Transfer Assistance: None Had a fall or experienced change in No Patient Requires Transmission-Based Precautions: No activities of daily living that may affect Patient Has Alerts: Yes risk of falls: Patient Alerts: NOT Diabetic Signs or symptoms of abuse/neglect since last visito No Hospitalized since last visit: No Implantable device outside of the clinic excluding No cellular tissue based products placed in the center since last visit: Pain Present Now: No Electronic Signature(s) Signed: 09/06/2022 5:06:55 PM By: Massie Kluver Entered By: Massie Kluver on 09/05/2022 12:09:48 -------------------------------------------------------------------------------- Clinic Level of Care Assessment Details Patient Name: Date of Service: Kristine Mater I. 09/05/2022 11:15 A M Medical Record Number: [id number] Patient Account Number: [id number] Date of Birth/Sex: Treating RN: 1956/07/20 (66 y.o. [name] Primary Care Jafeth Mustin: Jon Billings Other Clinician: Massie Kluver Referring Ebon Ketchum: Treating Izell Labat/Extender: Jomarie Longs in Treatment: 1 Clinic Level of Care Assessment Items TOOL 4 Quantity  Score '[]'$  - 0 Use when only an EandM is performed on FOLLOW-UP visit ASSESSMENTS - Nursing Assessment / Reassessment X- 1 10 Reassessment of Co-morbidities (includes updates in patient status) X- 1 5 Reassessment of Adherence to Treatment Plan COREENA, RUBALCAVA I ([id number]) [phone] Page 2 of 6 ASSESSMENTS - Wound and Skin A ssessment / Reassessment '[]'$  - Simple Wound Assessment / Reassessment - one wound 0 X- 3 5 Complex Wound Assessment / Reassessment - multiple wounds '[]'$  - 0 Dermatologic / Skin Assessment (not related to wound area) ASSESSMENTS - Focused Assessment '[]'$  - 0 Circumferential Edema Measurements - multi extremities '[]'$  - 0 Nutritional Assessment / Counseling / Intervention '[]'$  - 0 Lower Extremity Assessment (monofilament, tuning fork, pulses) '[]'$  - 0 Peripheral Arterial Disease Assessment (using hand held doppler) ASSESSMENTS - Ostomy and/or Continence Assessment and Care '[]'$  - 0 Incontinence Assessment and Management '[]'$  - 0 Ostomy Care Assessment and Management (repouching, etc.) PROCESS - Coordination of Care X - Simple Patient / Family Education for ongoing care 1 15 '[]'$  - 0 Complex (extensive) Patient / Family Education for ongoing care '[]'$  - 0 Staff obtains Programmer, systems, Records, T Results / Process Orders est '[]'$  - 0 Staff telephones HHA, Nursing Homes / Clarify orders / etc '[]'$  - 0 Routine Transfer to another Facility (non-emergent condition) '[]'$  - 0 Routine Hospital Admission (non-emergent condition) '[]'$  - 0 New Admissions / Biomedical engineer / Ordering NPWT Apligraf, etc. , '[]'$  - 0 Emergency Hospital Admission (emergent condition) X- 1 10 Simple Discharge Coordination '[]'$  - 0 Complex (extensive) Discharge Coordination PROCESS - Special Needs '[]'$  - 0 Pediatric / Minor Patient Management '[]'$  - 0 Isolation Patient Management '[]'$  - 0 Hearing / Language / Visual special needs '[]'$  - 0 Assessment of Community assistance  (transportation, D/C planning, etc.) '[]'$  - 0 Additional assistance / Altered mentation '[]'$  - 0 Support Surface(s) Assessment (bed, cushion, seat, etc.) INTERVENTIONS - Wound Cleansing / Measurement '[]'$  - 0 Simple Wound Cleansing -  one wound X- 3 5 Complex Wound Cleansing - multiple wounds '[]'$  - 0 Wound Imaging (photographs - any number of wounds) '[]'$  - 0 Wound Tracing (instead of photographs) '[]'$  - 0 Simple Wound Measurement - one wound '[]'$  - 0 Complex Wound Measurement - multiple wounds INTERVENTIONS - Wound Dressings '[]'$  - 0 Small Wound Dressing one or multiple wounds X- 3 15 Medium Wound Dressing one or multiple wounds '[]'$  - 0 Large Wound Dressing one or multiple wounds '[]'$  - 0 Application of Medications - topical '[]'$  - 0 Application of Medications - injection INTERVENTIONS - Miscellaneous '[]'$  - 0 External ear exam HENCHY, MCCAULEY I ([id number]) [id number] Page 3 of 6 '[]'$  - 0 Specimen Collection (cultures, biopsies, blood, body fluids, etc.) '[]'$  - 0 Specimen(s) / Culture(s) sent or taken to Lab for analysis '[]'$  - 0 Patient Transfer (multiple staff / Harrel Lemon Lift / Similar devices) '[]'$  - 0 Simple Staple / Suture removal (25 or less) '[]'$  - 0 Complex Staple / Suture removal (26 or more) '[]'$  - 0 Hypo / Hyperglycemic Management (close monitor of Blood Glucose) '[]'$  - 0 Ankle / Brachial Index (ABI) - do not check if billed separately '[]'$  - 0 Vital Signs Has the patient been seen at the hospital within the last three years: Yes Total Score: 115 Level Of Care: New/Established - Level 3 Electronic Signature(s) Signed: 09/06/2022 5:06:55 PM By: Massie Kluver Entered By: Massie Kluver on 09/05/2022 12:12:24 -------------------------------------------------------------------------------- Encounter Discharge Information Details Patient Name: Date of Service: Kristine Mater I. 09/05/2022 11:15 A M Medical Record Number: [id number] Patient Account Number:  [id number] Date of Birth/Sex: Treating RN: 05-20-1956 (66 y.o. [name] Primary Care Semiyah Newgent: Jon Billings Other Clinician: Massie Kluver Referring Zackry Deines: Treating Sunita Demond/Extender: Jomarie Longs in Treatment: 1 Encounter Discharge Information Items Discharge Condition: Stable Ambulatory Status: Ambulatory Discharge Destination: Home Transportation: Private Auto Accompanied By: self Schedule Follow-up Appointment: Yes Clinical Summary of Care: Electronic Signature(s) Signed: 09/06/2022 5:06:55 PM By: Massie Kluver Entered By: Massie Kluver on 09/05/2022 12:11:55 -------------------------------------------------------------------------------- Wound Assessment Details Patient Name: Date of Service: Kristine Mater I. 09/05/2022 11:15 A Caryn Section, Benjamine Mola I ([id number]) [id number] Page 4 of 6 Medical Record Number: [id number] Patient Account Number: [id number] Date of Birth/Sex: Treating RN: 01/17/1956 (66 y.o. [name] Primary Care Elyssia Strausser: Jon Billings Other Clinician: Massie Kluver Referring Kazue Cerro: Treating Rhylynn Perdomo/Extender: Jomarie Longs in Treatment: 1 Wound Status Wound Number: 1 Primary Trauma, Other Etiology: Wound Location: Left, Proximal Upper Leg Wound Open Wounding Event: Trauma Status: Date Acquired: 08/11/2022 Comorbid Anemia, Hypertension, Lupus Erythematosus, Osteoarthritis, Weeks Of Treatment: 1 History: Received Radiation Clustered Wound: Yes Wound Measurements Length: (cm) 1.5 Width: (cm) 0.5 Depth: (cm) 0.2 Area: (cm) 0.589 Volume: (cm) 0.118 % Reduction in Area: 96.3% % Reduction in Volume: 96.2% Epithelialization: None Wound Description Classification: Full Thickness Without Exposed Suppor Exudate Amount: Medium Exudate Type: Serous Exudate Color: amber t Structures Foul Odor After Cleansing: No Slough/Fibrino  Yes Wound Bed Granulation Amount: Small (1-33%) Exposed Structure Granulation Quality: Pink Fascia Exposed: No Necrotic Amount: Large (67-100%) Fat Layer (Subcutaneous Tissue) Exposed: Yes Necrotic Quality: Adherent Slough Tendon Exposed: No Muscle Exposed: No Joint Exposed: No Bone Exposed: No Electronic Signature(s) Signed: 09/06/2022 5:06:55 PM By: Massie Kluver Signed: 09/07/2022 1:14:49 PM By: Carlene Coria RN Entered By: Massie Kluver on 09/05/2022 12:10:42 -------------------------------------------------------------------------------- Wound Assessment Details Patient Name: Date of Service: Kristine Mater I. 09/05/2022 11:15 A M Medical Record Number: [id number] Patient  Account Number: [id number] Date of Birth/Sex: Treating RN: Jun 10, 1956 (66 y.o. [name] Primary Care Marlin Jarrard: Jon Billings Other Clinician: Massie Kluver Referring Chyanne Kohut: Treating Hutson Luft/Extender: Jomarie Longs in Treatment: 1 Wound Status Wound Number: 2 Primary Trauma, Other Etiology: Wound Location: Left, Distal Upper Leg Wound Open Wounding Event: Trauma Status: Date Acquired: 08/11/2022 Comorbid Anemia, Hypertension, Lupus Erythematosus, Osteoarthritis, Weeks Of Treatment: 1 History: Received Radiation Clustered Wound: No Wound Measurements DIANNAH, RINDFLEISCH I ([id number]) Length: (cm) 11 Width: (cm) 12 Depth: (cm) 2 Area: (cm) 103.673 Volume: (cm) 207.345 [id number] Page 5 of 6 % Reduction in Area: -4.8% % Reduction in Volume: 58.1% Epithelialization: None Wound Description Classification: Full Thickness With Exposed Suppo Exudate Amount: Medium Exudate Type: Serosanguineous Exudate Color: red, brown rt Structures Wound Bed Granulation Amount: Medium (34-66%) Exposed Structure Granulation Quality: Pink Fascia Exposed: No Necrotic Amount: Medium (34-66%) Fat Layer (Subcutaneous Tissue) Exposed:  Yes Necrotic Quality: Adherent Slough Tendon Exposed: No Muscle Exposed: No Joint Exposed: No Bone Exposed: No Electronic Signature(s) Signed: 09/06/2022 5:06:55 PM By: Massie Kluver Signed: 09/07/2022 1:14:49 PM By: Carlene Coria RN Entered By: Massie Kluver on 09/05/2022 12:10:46 -------------------------------------------------------------------------------- Wound Assessment Details Patient Name: Date of Service: Kristine Mater I. 09/05/2022 11:15 A M Medical Record Number: [id number] Patient Account Number: [id number] Date of Birth/Sex: Treating RN: [birth year] (66 y.o. [name] Primary Care Mykira Hofmeister: Jon Billings Other Clinician: Massie Kluver Referring Lashun Mccants: Treating Hong Timm/Extender: Jomarie Longs in Treatment: 1 Wound Status Wound Number: 3 Primary Trauma, Other Etiology: Wound Location: Right Knee Wound Open Wounding Event: Trauma Status: Date Acquired: 08/11/2022 Comorbid Anemia, Hypertension, Lupus Erythematosus, Osteoarthritis, Weeks Of Treatment: 1 History: Received Radiation Clustered Wound: No Wound Measurements Length: (cm) 3.6 Width: (cm) 6.5 Depth: (cm) 1 Area: (cm) 18.378 Volume: (cm) 18.378 % Reduction in Area: -56% % Reduction in Volume: 8.2% Epithelialization: None Wound Description Classification: Full Thickness Without Exposed Suppor Exudate Amount: Large Exudate Type: Serosanguineous Exudate Color: red, brown t Structures Foul Odor After Cleansing: No Slough/Fibrino Yes Wound Bed Granulation Amount: None Present (0%) Exposed Structure Necrotic Amount: Large (67-100%) Fascia Exposed: No DEZRA, MANDELLA I ([id number]) [id number] Page 6 of 6 Necrotic Quality: Eschar Fat Layer (Subcutaneous Tissue) Exposed: Yes Tendon Exposed: No Muscle Exposed: No Joint Exposed: No Bone Exposed: No Electronic Signature(s) Signed: 09/06/2022 5:06:55 PM By: Massie Kluver Signed: 09/07/2022 1:14:49 PM By: Carlene Coria RN Entered By: Massie Kluver on 09/05/2022 12:10:55

## 2022-09-07 NOTE — Telephone Encounter (Signed)
Summary: vomiting and diarrhea   Patient called in has vomiting and diarrhea, can't keep pepsi down either.         Chief Complaint: V/D since Thursday  Symptoms: vomiting x 3 today and dry heeves throughout day , diarrhea x 2 today one time loose BM 2nd time watery. Unable to tolerate fluids . "Can't keep pepsi down" Frequency: started Thursday after eating Chinsese Pertinent Negatives: Patient denies fever, no vomiting dark colored emesis. No blood in vomiting or diarrhea. No dizziness. Disposition: '[x]'$ ED /'[]'$ Urgent Care (no appt availability in office) / '[]'$ Appointment(In office/virtual)/ '[]'$  [facility]/ '[]'$ Home Care/ '[x]'$ Refused Recommended Disposition /'[]'$ [facility]/ '[]'$  Follow-up with PCP Additional Notes:   Recommended ED due to protocol and age. Patient reports she is not going to ED she was just seen by PCP yesterday and would like to see what PCP recommends. Advise to start clear liquids by the tablespoon and if able to tolerate in 30 minutes try more. After 4 hours no vomiting introduce foods like jello. Please advise .      Reason for Disposition  [1] MODERATE vomiting (e.g., 3 - 5 times/day) AND [2] age > 60 years  Answer Assessment - Initial Assessment Questions 1. VOMITING SEVERITY: "How many times have you vomited in the past 24 hours?"     - MILD:  1 - 2 times/day    - MODERATE: 3 - 5 times/day, decreased oral intake without significant weight loss or symptoms of dehydration    - SEVERE: 6 or more times/day, vomits everything or nearly everything, with significant weight loss, symptoms of dehydration      Moderate  2. ONSET: "When did the vomiting begin?"      Thursday  3. FLUIDS: "What fluids or food have you vomited up today?" "Have you been able to keep any fluids down?"     Pepsi and unable to keep down  4. ABDOMEN PAIN: "Are your having any abdomen pain?" If Yes : "How bad is it and what does it feel like?" (e.g., crampy, dull, intermittent,  constant)      Soreness to ribs from dry heaves  5. DIARRHEA: "Is there any diarrhea?" If Yes, ask: "How many times today?"      Yes x 2  one time loose and 2nd time watery  6. CONTACTS: "Is there anyone else in the family with the same symptoms?"      No  7. CAUSE: "What do you think is causing your vomiting?"     Not sure  8. HYDRATION STATUS: "Any signs of dehydration?" (e.g., dry mouth [not only dry lips], too weak to stand) "When did you last urinate?"     no 9. OTHER SYMPTOMS: "Do you have any other symptoms?" (e.g., fever, headache, vertigo, vomiting blood or coffee grounds, recent head injury)     Nausea , vomiting diarrhea  10. PREGNANCY: "Is there any chance you are pregnant?" "When was your last menstrual period?"       na  Protocols used: Vomiting-A-AH

## 2022-09-07 NOTE — Addendum Note (Signed)
Addended by: Jon Billings on: 09/07/2022 08:01 AM   Modules accepted: Orders

## 2022-09-07 NOTE — Progress Notes (Signed)
[name], [name] [name] ([id number]) [id number] Page 1 of 2 Visit Report for 09/05/2022 Physician Orders Details Patient Name: Date of Service: [name]. 09/05/2022 11:15 A M Medical Record Number: [id number] Patient Account Number: [id number] Date of Birth/Sex: Treating RN: 11-29-1955 (66 y.o. [name] Primary Care Provider: Jon Billings Other Clinician: Massie Kluver Referring Provider: Treating Provider/Extender: Jomarie Longs in Treatment: 1 Verbal / Phone Orders: No Diagnosis Coding Follow-up Appointments Return Appointment in 1 week. Anesthetic (Use 'Patient Medications' Section for Anesthetic Order Entry) Lidocaine applied to wound bed Edema Control - Lymphedema / Segmental Compressive Device / Other Elevate, Exercise Daily and A void Standing for Long Periods of Time. Elevate legs to the level of the heart and pump ankles as often as possible Elevate leg(s) parallel to the floor when sitting. Medications-Please add to medication list. ntibiotics - continue antibiotics P.O. A Wound Treatment Wound #1 - Upper Leg Wound Laterality: Left, Proximal Peri-Wound Care: Skin Prep 1 x Per Day/30 Days Discharge Instructions: Use skin prep as directed Prim Dressing: Xeroform 4x4-HBD (in/in) 1 x Per Day/30 Days ary Discharge Instructions: Apply Xeroform 4x4-HBD (in/in) as directed Secondary Dressing: Coverlet Latex-Free Fabric Adhesive Dressings 1 x Per Day/30 Days Discharge Instructions: 1.5 x 2 Wound #2 - Upper Leg Wound Laterality: Left, Distal Peri-Wound Care: Skin Prep 1 x Per Day/30 Days Discharge Instructions: Use skin prep as directed Prim Dressing: Gauze 1 x Per Day/30 Days ary Discharge Instructions: moistened with Dakins Solution Prim Dressing: hydrofera blue rope 1 x Per Day/30 Days ary Discharge Instructions: insert into tunnel area at 12 oclock Secondary Dressing: ABD Pad 5x9 (in/in) 1 x  Per Day/30 Days Discharge Instructions: Cover with ABD pad Secured With: Medipore T - 63M Medipore H Soft Cloth Surgical T ape ape, 2x2 (in/yd) 1 x Per Day/30 Days Wound #3 - Knee Wound Laterality: Right Peri-Wound Care: Skin Prep 1 x Per Day/30 Days Discharge Instructions: Use skin prep as directed Prim Dressing: Gauze 1 x Per Day/30 Days ary Discharge Instructions: moistened with Dakins Solution Secondary Dressing: (BORDER) Zetuvit Plus SILICONE BORDER Dressing 5x5 (in/in) 1 x Per Day/30 Days [name], [name] ([id number]) [id number] Page 2 of 2 Discharge Instructions: Please do not put silicone bordered dressings under wraps. Use non-bordered dressing only. Electronic Signature(s) Signed: 09/05/2022 2:25:25 PM By: Kalman Shan DO Signed: 09/06/2022 5:06:55 PM By: Massie Kluver Entered By: Massie Kluver on 09/05/2022 12:11:33 -------------------------------------------------------------------------------- SuperBill Details Patient Name: Date of Service: [name]. 09/05/2022 Medical Record Number: [id number] Patient Account Number: [id number] Date of Birth/Sex: Treating RN: 02/24/1956 (66 y.o. 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

## 2022-09-10 ENCOUNTER — Encounter: Payer: Self-pay | Admitting: Nurse Practitioner

## 2022-09-10 ENCOUNTER — Ambulatory Visit (INDEPENDENT_AMBULATORY_CARE_PROVIDER_SITE_OTHER): Payer: Medicare Other

## 2022-09-10 DIAGNOSIS — D513 Other dietary vitamin B12 deficiency anemia: Secondary | ICD-10-CM | POA: Diagnosis not present

## 2022-09-10 DIAGNOSIS — M328 Other forms of systemic lupus erythematosus: Secondary | ICD-10-CM | POA: Diagnosis not present

## 2022-09-10 DIAGNOSIS — D519 Vitamin B12 deficiency anemia, unspecified: Secondary | ICD-10-CM | POA: Diagnosis not present

## 2022-09-10 DIAGNOSIS — L97812 Non-pressure chronic ulcer of other part of right lower leg with fat layer exposed: Secondary | ICD-10-CM | POA: Diagnosis not present

## 2022-09-10 DIAGNOSIS — L98493 Non-pressure chronic ulcer of skin of other sites with necrosis of muscle: Secondary | ICD-10-CM | POA: Diagnosis not present

## 2022-09-10 NOTE — Telephone Encounter (Signed)
Please call and check on patient and see how she is doing.  If she isn't any better she should be seen in the ER for IV fluids.

## 2022-09-10 NOTE — Telephone Encounter (Signed)
Spoke with patient, she stated she Is able to keep fluids down and she does feel a little better than she did Friday. Gave patient [name]'s recommendations and pt has verbalized understanding

## 2022-09-11 ENCOUNTER — Encounter: Payer: Medicare Other | Admitting: Physician Assistant

## 2022-09-11 DIAGNOSIS — M328 Other forms of systemic lupus erythematosus: Secondary | ICD-10-CM | POA: Diagnosis not present

## 2022-09-11 DIAGNOSIS — D513 Other dietary vitamin B12 deficiency anemia: Secondary | ICD-10-CM | POA: Diagnosis not present

## 2022-09-11 DIAGNOSIS — S71102A Unspecified open wound, left thigh, initial encounter: Secondary | ICD-10-CM | POA: Diagnosis not present

## 2022-09-11 DIAGNOSIS — L98493 Non-pressure chronic ulcer of skin of other sites with necrosis of muscle: Secondary | ICD-10-CM | POA: Diagnosis not present

## 2022-09-11 DIAGNOSIS — S81001A Unspecified open wound, right knee, initial encounter: Secondary | ICD-10-CM | POA: Diagnosis not present

## 2022-09-11 DIAGNOSIS — L97812 Non-pressure chronic ulcer of other part of right lower leg with fat layer exposed: Secondary | ICD-10-CM | POA: Diagnosis not present

## 2022-09-11 NOTE — Progress Notes (Signed)
ADYLEE, LEONARDO [name] ([id number]) [id number] Page 1 of 2 Visit Report for 09/10/2022 Physician Orders Details Patient Name: Date of Service: [name]. 09/10/2022 11:15 A M Medical Record Number: [id number] Patient Account Number: [id number] Date of Birth/Sex: Treating RN: Dec 01, 1955 (66 y.o. [name] Primary Care Provider: Jon Billings Other Clinician: Massie Kluver Referring Provider: Treating Provider/Extender: Venia Minks in Treatment: 2 Verbal / Phone Orders: No Diagnosis Coding Follow-up Appointments Return Appointment in 1 week. Anesthetic (Use 'Patient Medications' Section for Anesthetic Order Entry) Lidocaine applied to wound bed Edema Control - Lymphedema / Segmental Compressive Device / Other Elevate, Exercise Daily and A void Standing for Long Periods of Time. Elevate legs to the level of the heart and pump ankles as often as possible Elevate leg(s) parallel to the floor when sitting. Medications-Please add to medication list. ntibiotics - continue antibiotics P.O. A Wound Treatment Wound #1 - Upper Leg Wound Laterality: Left, Proximal Peri-Wound Care: Skin Prep 1 x Per Day/30 Days Discharge Instructions: Use skin prep as directed Prim Dressing: Xeroform 4x4-HBD (in/in) 1 x Per Day/30 Days ary Discharge Instructions: Apply Xeroform 4x4-HBD (in/in) as directed Secondary Dressing: Coverlet Latex-Free Fabric Adhesive Dressings 1 x Per Day/30 Days Discharge Instructions: 1.5 x 2 Wound #2 - Upper Leg Wound Laterality: Left, Distal Peri-Wound Care: Skin Prep 1 x Per Day/30 Days Discharge Instructions: Use skin prep as directed Prim Dressing: Gauze 1 x Per Day/30 Days ary Discharge Instructions: moistened with Dakins Solution Prim Dressing: hydrofera blue rope 1 x Per Day/30 Days ary Discharge Instructions: insert into tunnel area at 12 oclock Secondary Dressing: ABD Pad 5x9 (in/in) 1 x Per  Day/30 Days Discharge Instructions: Cover with ABD pad Secured With: Medipore T - 19M Medipore H Soft Cloth Surgical T ape ape, 2x2 (in/yd) 1 x Per Day/30 Days Wound #3 - Knee Wound Laterality: Right Peri-Wound Care: Skin Prep 1 x Per Day/30 Days Discharge Instructions: Use skin prep as directed Prim Dressing: Gauze 1 x Per Day/30 Days ary Discharge Instructions: moistened with Dakins Solution Secondary Dressing: (BORDER) Zetuvit Plus SILICONE BORDER Dressing 5x5 (in/in) 1 x Per Day/30 Days [name], [name] ([id number]) [id number] Page 2 of 2 Discharge Instructions: Please do not put silicone bordered dressings under wraps. Use non-bordered dressing only. Electronic Signature(s) Signed: 09/11/2022 8:14:16 AM By: [name] PA-C Signed: 09/11/2022 5:00:42 PM By: Massie Kluver Entered By: Massie Kluver on 09/10/2022 11:17:27 -------------------------------------------------------------------------------- SuperBill Details Patient Name: Date of Service: [name]. 09/10/2022 Medical Record Number: [id number] Patient Account Number: [id number] Date of Birth/Sex: Treating RN: [birth year] (66 y.o. 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 

## 2022-09-11 NOTE — Progress Notes (Signed)
ANJANAE, WOEHRLE I ([id number]) [id number] Page 1 of 6 Visit Report for 09/10/2022 Arrival Information Details Patient Name: Date of Service: [name] 09/10/2022 11:15 A M Medical Record Number: [id number] Patient Account Number: [id number] Date of Birth/Sex: Treating RN: 01/04/1956 (66 y.o. [name], [name] Primary Care Tejal Monroy: Jon Billings Other Clinician: Massie Kluver Referring Ark Agrusa: Treating Manly Nestle/Extender: Venia Minks in Treatment: 2 Visit Information History Since Last Visit All ordered tests and consults were completed: No Patient Arrived: Ambulatory Added or deleted any medications: No Arrival Time: 11:10 Any new allergies or adverse reactions: No Transfer Assistance: None Had a fall or experienced change in No Patient Requires Transmission-Based Precautions: No activities of daily living that may affect Patient Has Alerts: Yes risk of falls: Patient Alerts: NOT Diabetic Signs or symptoms of abuse/neglect since last visito No Hospitalized since last visit: No Implantable device outside of the clinic excluding No cellular tissue based products placed in the center since last visit: Pain Present Now: No Electronic Signature(s) Signed: 09/11/2022 5:00:42 PM By: Massie Kluver Entered By: Massie Kluver on 09/10/2022 11:15:20 -------------------------------------------------------------------------------- Clinic Level of Care Assessment Details Patient Name: Date of Service: [name] 09/10/2022 11:15 A M Medical Record Number: [id number] Patient Account Number: [id number] Date of Birth/Sex: Treating RN: Apr 21, 1956 (66 y.o. [name] Primary Care Fraya Ueda: Jon Billings Other Clinician: Massie Kluver Referring Iyania Denne: Treating Wendelin Reader/Extender: Venia Minks in Treatment: 2 Clinic Level of Care Assessment Items TOOL 4 Quantity Score '[]'$  -  0 Use when only an EandM is performed on FOLLOW-UP visit ASSESSMENTS - Nursing Assessment / Reassessment X- 1 10 Reassessment of Co-morbidities (includes updates in patient status) X- 1 5 Reassessment of Adherence to Treatment Plan TOSHIKA, PARROW I ([id number]) [id number] Page 2 of 6 ASSESSMENTS - Wound and Skin A ssessment / Reassessment '[]'$  - Simple Wound Assessment / Reassessment - one wound 0 X- 3 5 Complex Wound Assessment / Reassessment - multiple wounds '[]'$  - 0 Dermatologic / Skin Assessment (not related to wound area) ASSESSMENTS - Focused Assessment '[]'$  - 0 Circumferential Edema Measurements - multi extremities '[]'$  - 0 Nutritional Assessment / Counseling / Intervention '[]'$  - 0 Lower Extremity Assessment (monofilament, tuning fork, pulses) '[]'$  - 0 Peripheral Arterial Disease Assessment (using hand held doppler) ASSESSMENTS - Ostomy and/or Continence Assessment and Care '[]'$  - 0 Incontinence Assessment and Management '[]'$  - 0 Ostomy Care Assessment and Management (repouching, etc.) PROCESS - Coordination of Care X - Simple Patient / Family Education for ongoing care 1 15 '[]'$  - 0 Complex (extensive) Patient / Family Education for ongoing care '[]'$  - 0 Staff obtains Programmer, systems, Records, T Results / Process Orders est '[]'$  - 0 Staff telephones HHA, Nursing Homes / Clarify orders / etc '[]'$  - 0 Routine Transfer to another Facility (non-emergent condition) '[]'$  - 0 Routine Hospital Admission (non-emergent condition) '[]'$  - 0 New Admissions / Biomedical engineer / Ordering NPWT Apligraf, etc. , '[]'$  - 0 Emergency Hospital Admission (emergent condition) X- 1 10 Simple Discharge Coordination '[]'$  - 0 Complex (extensive) Discharge Coordination PROCESS - Special Needs '[]'$  - 0 Pediatric / Minor Patient Management '[]'$  - 0 Isolation Patient Management '[]'$  - 0 Hearing / Language / Visual special needs '[]'$  - 0 Assessment of Community assistance (transportation,  D/C planning, etc.) '[]'$  - 0 Additional assistance / Altered mentation '[]'$  - 0 Support Surface(s) Assessment (bed, cushion, seat, etc.) INTERVENTIONS - Wound Cleansing / Measurement '[]'$  - 0 Simple Wound Cleansing -  one wound X- 3 5 Complex Wound Cleansing - multiple wounds '[]'$  - 0 Wound Imaging (photographs - any number of wounds) '[]'$  - 0 Wound Tracing (instead of photographs) '[]'$  - 0 Simple Wound Measurement - one wound '[]'$  - 0 Complex Wound Measurement - multiple wounds INTERVENTIONS - Wound Dressings '[]'$  - 0 Small Wound Dressing one or multiple wounds X- 3 15 Medium Wound Dressing one or multiple wounds '[]'$  - 0 Large Wound Dressing one or multiple wounds '[]'$  - 0 Application of Medications - topical '[]'$  - 0 Application of Medications - injection INTERVENTIONS - Miscellaneous '[]'$  - 0 External ear exam GENEE, RANN I ([id number]) [id number] Page 3 of 6 '[]'$  - 0 Specimen Collection (cultures, biopsies, blood, body fluids, etc.) '[]'$  - 0 Specimen(s) / Culture(s) sent or taken to Lab for analysis '[]'$  - 0 Patient Transfer (multiple staff / Harrel Lemon Lift / Similar devices) '[]'$  - 0 Simple Staple / Suture removal (25 or less) '[]'$  - 0 Complex Staple / Suture removal (26 or more) '[]'$  - 0 Hypo / Hyperglycemic Management (close monitor of Blood Glucose) '[]'$  - 0 Ankle / Brachial Index (ABI) - do not check if billed separately '[]'$  - 0 Vital Signs Has the patient been seen at the hospital within the last three years: Yes Total Score: 115 Level Of Care: New/Established - Level 3 Electronic Signature(s) Signed: 09/11/2022 5:00:42 PM By: Massie Kluver Entered By: Massie Kluver on 09/10/2022 11:18:34 -------------------------------------------------------------------------------- Encounter Discharge Information Details Patient Name: Date of Service: [name] 09/10/2022 11:15 A M Medical Record Number: [id number] Patient Account Number: [id number] Date of  Birth/Sex: Treating RN: 02-27-1956 (66 y.o. [name] Primary Care Steele Stracener: Jon Billings Other Clinician: Massie Kluver Referring Dayonna Selbe: Treating Jizel Cheeks/Extender: Venia Minks in Treatment: 2 Encounter Discharge Information Items Discharge Condition: Stable Ambulatory Status: Ambulatory Discharge Destination: Home Transportation: Private Auto Accompanied By: self Schedule Follow-up Appointment: Yes Clinical Summary of Care: Electronic Signature(s) Signed: 09/11/2022 5:00:42 PM By: Massie Kluver Entered By: Massie Kluver on 09/10/2022 11:17:51 -------------------------------------------------------------------------------- Wound Assessment Details Patient Name: Date of Service: [name] 09/10/2022 11:15 A MACKENSI, MAHADEO I ([id number]) [id number] Page 4 of 6 Medical Record Number: [id number] Patient Account Number: [id number] Date of Birth/Sex: Treating RN: 10-31-1955 (66 y.o. [name], [name] Primary Care Chau Sawin: Jon Billings Other Clinician: Massie Kluver Referring Ivone Licht: Treating Dakai Braithwaite/Extender: Venia Minks in Treatment: 2 Wound Status Wound Number: 1 Primary Trauma, Other Etiology: Wound Location: Left, Proximal Upper Leg Wound Open Wounding Event: Trauma Status: Date Acquired: 08/11/2022 Comorbid Anemia, Hypertension, Lupus Erythematosus, Osteoarthritis, Weeks Of Treatment: 2 History: Received Radiation Clustered Wound: Yes Wound Measurements Length: (cm) 0.2 Width: (cm) 0.2 Depth: (cm) 0.1 Area: (cm) 0.031 Volume: (cm) 0.003 % Reduction in Area: 99.8% % Reduction in Volume: 99.9% Epithelialization: None Wound Description Classification: Full Thickness Without Exposed Suppor Exudate Amount: Medium Exudate Type: Serous Exudate Color: amber t Structures Foul Odor After Cleansing: No Slough/Fibrino Yes Wound Bed Granulation Amount: Small  (1-33%) Exposed Structure Granulation Quality: Pink Fascia Exposed: No Necrotic Amount: Large (67-100%) Fat Layer (Subcutaneous Tissue) Exposed: Yes Necrotic Quality: Adherent Slough Tendon Exposed: No Muscle Exposed: No Joint Exposed: No Bone Exposed: No Electronic Signature(s) Signed: 09/10/2022 1:46:28 PM By: Gretta Cool, BSN, RN, CWS, [name] RN, BSN Signed: 09/11/2022 5:00:42 PM By: Massie Kluver Entered By: Massie Kluver on 09/10/2022 11:15:54 -------------------------------------------------------------------------------- Wound Assessment Details Patient Name: Date of Service: [name] 09/10/2022 11:15 A M Medical  Record Number: [id number] Patient Account Number: [id number] Date of Birth/Sex: Treating RN: 1956/10/02 (66 y.o. [name] Primary Care Omar Gayden: Jon Billings Other Clinician: Massie Kluver Referring Allyse Fregeau: Treating Jahkeem Kurka/Extender: Venia Minks in Treatment: 2 Wound Status Wound Number: 2 Primary Trauma, Other Etiology: Wound Location: Left, Distal Upper Leg Wound Open Wounding Event: Trauma Status: Date Acquired: 08/11/2022 Comorbid Anemia, Hypertension, Lupus Erythematosus, Osteoarthritis, Weeks Of Treatment: 2 History: Received Radiation Clustered Wound: No Wound Measurements PRABHLEEN, MONTEMAYOR I ([id number]) Length: (cm) 8.2 Width: (cm) 11.5 Depth: (cm) 1.5 Area: (cm) 74.063 Volume: (cm) 111.095 [id number] Page 5 of 6 % Reduction in Area: 25.2% % Reduction in Volume: 77.5% Epithelialization: None Wound Description Classification: Full Thickness With Exposed Suppo Exudate Amount: Medium Exudate Type: Serosanguineous Exudate Color: red, brown rt Structures Wound Bed Granulation Amount: Medium (34-66%) Exposed Structure Granulation Quality: Pink Fascia Exposed: No Necrotic Amount: Medium (34-66%) Fat Layer (Subcutaneous Tissue) Exposed: Yes Necrotic Quality: Adherent  Slough Tendon Exposed: No Muscle Exposed: No Joint Exposed: No Bone Exposed: No Electronic Signature(s) Signed: 09/10/2022 1:46:28 PM By: Gretta Cool, BSN, RN, CWS, [name] RN, BSN Signed: 09/11/2022 5:00:42 PM By: Massie Kluver Entered By: Massie Kluver on 09/10/2022 11:15:59 -------------------------------------------------------------------------------- Wound Assessment Details Patient Name: Date of Service: [name] 09/10/2022 11:15 A M Medical Record Number: [id number] Patient Account Number: [id number] Date of Birth/Sex: Treating RN: 04/30/1956 (66 y.o. [name], [name] Primary Care Faustine Tates: Jon Billings Other Clinician: Massie Kluver Referring Khaliyah Northrop: Treating Ashlei Chinchilla/Extender: Venia Minks in Treatment: 2 Wound Status Wound Number: 3 Primary Trauma, Other Etiology: Wound Location: Right Knee Wound Open Wounding Event: Trauma Status: Date Acquired: 08/11/2022 Comorbid Anemia, Hypertension, Lupus Erythematosus, Osteoarthritis, Weeks Of Treatment: 2 History: Received Radiation Clustered Wound: No Wound Measurements Length: (cm) 3.8 Width: (cm) 6.4 Depth: (cm) 0.9 Area: (cm) 19.101 Volume: (cm) 17.191 % Reduction in Area: -62.1% % Reduction in Volume: 14.2% Epithelialization: None Wound Description Classification: Full Thickness Without Exposed Suppor Exudate Amount: Large Exudate Type: Serosanguineous Exudate Color: red, brown t Structures Foul Odor After Cleansing: No Slough/Fibrino Yes Wound Bed Granulation Amount: None Present (0%) Exposed Structure Necrotic Amount: Large (67-100%) Fascia Exposed: No CECILE, GILLISPIE I ([id number]) [id number] Page 6 of 6 Necrotic Quality: Eschar Fat Layer (Subcutaneous Tissue) Exposed: Yes Tendon Exposed: No Muscle Exposed: No Joint Exposed: No Bone Exposed: No Electronic Signature(s) Signed: 09/10/2022 1:46:28 PM By: Gretta Cool, BSN, RN, CWS, [name] RN,  BSN Signed: 09/11/2022 5:00:42 PM By: Massie Kluver Entered By: Massie Kluver on 09/10/2022 11:16:03

## 2022-09-11 NOTE — Progress Notes (Addendum)
[name], HORSFORD [name] ([id number]) [id number] Page 1 of 8 Visit Report for 09/11/2022 Chief Complaint Document Details Patient Name: Date of Service: Kristine [name]. 09/11/2022 8:00 A M Medical Record Number: [id number] Patient Account Number: [id number] Date of Birth/Sex: Treating RN: Mar 29, 1956 (66 y.o. [name] Primary Care Provider: Jon Billings Other Clinician: Referring Provider: Treating Provider/Extender: Venia Minks in Treatment: 2 Information Obtained from: Patient Chief Complaint Right knee and left thigh ulcers secondary to a dog bite and fall Electronic Signature(s) Signed: 09/11/2022 8:15:18 AM By: [name] PA-C Entered By: [name] on 09/11/2022 08:15:17 -------------------------------------------------------------------------------- HPI Details Patient Name: Date of Service: Kristine [name]. 09/11/2022 8:00 A M Medical Record Number: [id number] Patient Account Number: [id number] Date of Birth/Sex: Treating RN: 14-Oct-1956 (66 y.o. [name] Primary Care Provider: Jon Billings Other Clinician: Referring Provider: Treating Provider/Extender: Venia Minks in Treatment: 2 History of Present Illness HPI Description: 08-24-2022 patient presents today for initial evaluation here in our clinic concerning issues that she has been having with wounds in 3 locations. 1 areas in the proximal left thigh this is due to a fall that she sustained on 08-11-2022. Subsequently she has then a second issue in the distal thigh as well as the right knee location. These are due to a unusual situation where she was actually leaving had just been petting her grand dog when she walked to her car. She tells me that she got dizzy and stumbled and fell and when she fell she called out. When she did this subsequently her grand dog ran over were not sure if that he thought he was  playing with her what but nonetheless ended up biting her on the left thigh as well as on the right knee. Here she had 2 areas of laceration which she subsequently went to the ER for and they attempted to suture both closed. Unfortunately that just did not take. At this point [name] think the sutures can need to be removed from both locations and to be honest there is a significant area of necrosis on the left thigh this may need to be cleaned out and away. Fortunately I do not see any signs of systemic infection though locally there is some evidence of infection she has been on Augmentin which just recently switched to doxycycline no cultures been performed at this point. From the standpoint of past medical history the patient does have a history of anemia which they believe is related to B12. She does not have diabetes she does have a history of lupus however. She is a former smoker. 11/7; this is a patient who has a severe dog bite injury on her upper anterior left thigh. Also an area on the right medial knee. We have been seeing her daily and using Dakin's wet-to-dry. [name] was shown yesterday the culture of drainage that was done last week that shows Pseudomonas. She has multiple drug interactions with quinolones therefore [name] AYUSHI, PLA [name] ([id number]) [id number] Page 2 of 8 gave her a third-generation cephalosporin. HOWEVER apparently the pharmacy would not fill the prescription because of the concerns about allergic reaction to the capsule itself related to beef products. [name] could not find this anywhere even on Google however she has taken capsule antibiotics in the past and [name] told her [name] think that this risk is small compared to not treating the Pseudomonas. She has not been systemically unwell she is in some discomfort 09-04-2022  upon evaluation today patient unfortunately he is continuing to have some significant issues here with her leg although I do feel like she is  better than she was last week. Fortunately there does not appear to be any evidence of systemic infection though locally we definitely noted Pseudomonas as the causative agent on the left thigh location with a culture that needed from the deep tissue wound area. Subsequently the only active antibiotics that are going to take care of this are Cipro, Levaquin, and Baxdela. Unfortunately the Cipro and Levaquin cannot be taken by her due to the interaction with Plaquenil causing a QT prolongation which is good to be a significant issue. I do not want that to be a complicating factor and therefore we need to look toward the [place] which [name] understand is definitely expensive but [name] am not sure if there is another option to be perfectly honest. [name] discussed this with the patient today and she is in agreement with plan we have also made a referral to infectious disease though if we can get this treated with Baxdela then she may not even end up requiring the infectious disease referral. Upon inspection patient's wound bed actually showed signs of 09-11-2022 upon evaluation today patient appears to be doing well currently in regard to her wound. She is actually making progress and we actually did find out today that we did get the [place] approved. This is great news as [name] think it will probably prevent her from having to go to the infectious disease doctors will get the infection under control as it stands right now. She is very pleased to hear this and [name] likewise [name] am extremely pleased. It still can be quite pricey it is good to be $350 with her insurance but that is better than close to 2000 without. Electronic Signature(s) Signed: 09/11/2022 4:35:34 PM By: [name] PA-C Entered By: [name] on 09/11/2022 16:35:34 -------------------------------------------------------------------------------- Physical Exam Details Patient Name: Date of Service: Kristine [name]. 09/11/2022 8:00 A  M Medical Record Number: [id number] Patient Account Number: [id number] Date of Birth/Sex: Treating RN: 21-Mar-1956 (66 y.o. [name] Primary Care Provider: Jon Billings Other Clinician: Referring Provider: Treating Provider/Extender: Venia Minks in Treatment: 2 Constitutional Well-nourished and well-hydrated in no acute distress. Respiratory normal breathing without difficulty. Psychiatric this patient is able to make decisions and demonstrates good insight into disease process. Alert and Oriented x 3. pleasant and cooperative. Notes Upon inspection patient's wound bed actually showed signs of good granulation and epithelization at this point. Fortunately there does not appear to be any signs of infection which is great news and overall [name] am extremely pleased with where we stand currently. Electronic Signature(s) Signed: 09/11/2022 4:39:43 PM By: [name] PA-C Entered By: [name] on 09/11/2022 16:39:42 Physician Orders Details -------------------------------------------------------------------------------- Allayne Stack [name] ([id number]) [id number] Page 3 of 8 Patient Name: Date of Service: Kristine [name]. 09/11/2022 8:00 A M Medical Record Number: [id number] Patient Account Number: [id number] Date of Birth/Sex: Treating RN: 08-31-1956 (66 y.o. 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 Nurse Visit as needed - daily unless seen by MD Anesthetic (Use 'Patient Medications' Section for Anesthetic Order Entry) Lidocaine applied to wound bed Edema Control - Lymphedema / Segmental Compressive Device / Other Elevate, Exercise Daily and A void Standing for Long Periods of Time. Elevate legs to the level of the heart and pump ankles as often as possible Elevate leg(s) parallel to the floor when sitting. Medications-Please add to medication list. ntibiotics - continue antibiotics when available P.O. A Wound Treatment Wound #1 - Upper Leg Wound Laterality: Left, Proximal Peri-Wound Care: Skin Prep 1 x Per Day/30 Days Discharge Instructions: Use skin prep as directed Topical: Gentamicin 1 x Per Day/30 Days Discharge Instructions: Apply as directed by provider. Prim Dressing: Xeroform 4x4-HBD (in/in) 1 x Per Day/30 Days ary Discharge Instructions: Apply Xeroform 4x4-HBD (in/in) as directed Secondary Dressing: ABD Pad 5x9 (in/in) 1 x Per Day/30 Days Discharge Instructions: Cover with ABD pad Secured With: Medipore T - 54M Medipore H Soft Cloth Surgical T ape ape, 2x2 (in/yd) 1 x Per Day/30 Days Wound #2 - Upper Leg Wound Laterality: Left, Distal Peri-Wound Care: Skin Prep 1 x Per Day/30 Days Discharge Instructions: Use skin prep as directed Topical: Gentamicin 1 x Per Day/30 Days Discharge Instructions: Apply as directed by provider. Prim Dressing: Gauze 1 x Per Day/30 Days ary Discharge Instructions: moistened with Dakins Solution Prim Dressing: hydrofera blue rope 1 x Per Day/30 Days ary Discharge Instructions: change 3 times per week one rope coated with gentamycininsert into tunnel area at  12 oclock Secondary Dressing: ABD Pad 5x9 (in/in) 1 x Per Day/30 Days Discharge Instructions: Cover with ABD pad Secured With: Medipore T - 54M Medipore H Soft Cloth Surgical T ape ape, 2x2 (in/yd) 1 x Per Day/30 Days Wound #3 - Knee Wound Laterality: Right Peri-Wound Care: Skin Prep 3 x Per Week/30 Days ADIRA, [place] [name] ([id number]) [id number] Page 4 of 8 Discharge Instructions: Use skin prep as directed Topical: Gentamicin 3 x Per Week/30 Days Discharge Instructions: Apply as directed by provider. Prim Dressing: Hydrofera Blue Ready Transfer Foam, 4x5 (in/in) 3 x Per Week/30 Days ary Discharge Instructions: Apply Hydrofera Blue Ready to wound bed as directed Secondary Dressing: (BORDER) Zetuvit Plus SILICONE BORDER Dressing 5x5 (in/in) 3 x Per Week/30 Days Discharge Instructions: Please do not put silicone bordered dressings under wraps. Use non-bordered dressing only. Electronic Signature(s) Signed: 09/11/2022 5:41:33 PM By: [name] PA-C Signed: 09/20/2022 4:41:36 PM By: Carlene Coria RN Entered By: Carlene Coria on 09/11/2022 13:53:24 -------------------------------------------------------------------------------- Problem List Details Patient Name: Date of Service: Kristine [name]. 09/11/2022 8:00 A M Medical Record Number: [id number] Patient Account Number: [id number] Date of Birth/Sex: Treating RN: 1955-11-21 (66 y.o. [name] Primary Care Provider: Jon Billings Other Clinician: Referring Provider: Treating Provider/Extender: Venia Minks in Treatment: 2 Active Problems ICD-10 Encounter Code Description Active Date MDM Diagnosis W54.0XXA Bitten by dog, initial encounter 08/24/2022 No Yes L03.116 Cellulitis of left lower limb 09/04/2022 No Yes S80.12XA Contusion of left lower leg, initial encounter 08/24/2022 No Yes T79.2XXA Traumatic secondary and recurrent hemorrhage and seroma, initial encounter  08/24/2022 No Yes S80.812A Abrasion, left lower leg, initial encounter 08/24/2022 No Yes L97.823 Non-pressure  chronic ulcer of other part of left lower leg with necrosis of 08/24/2022 No Yes muscle L98.492 Non-pressure chronic ulcer of skin of other sites with fat layer exposed 08/24/2022 No Yes D51.3 Other dietary vitamin B12 deficiency anemia 08/24/2022 No Yes GUADALUPE, KEREKES [name] ([id number]) [id number] Page 5 of 8 M32.8 Other forms of systemic lupus erythematosus 08/24/2022 No Yes Inactive Problems Resolved Problems Electronic Signature(s) Signed: 09/11/2022 8:15:02 AM By: [name] PA-C Entered By: [name] on 09/11/2022 08:15:01 -------------------------------------------------------------------------------- Progress Note Details Patient Name: Date of Service: Kristine [name]. 09/11/2022 8:00 A M Medical Record Number: [id number] Patient Account Number: [id number] Date of Birth/Sex: Treating RN: [birth year] (66 y.o. [name] Primary Care Provider: Jon Billings Other Clinician: Referring Provider: Treating Provider/Extender: Venia Minks in Treatment: 2 Subjective Chief Complaint Information obtained from Patient Right knee and left thigh ulcers secondary to a dog bite and fall History of Present Illness (HPI) 08-24-2022 patient presents today for initial evaluation here in our clinic concerning issues that she has been having with wounds in 3 locations. 1 areas in the proximal left thigh this is due to a fall that she sustained on 08-11-2022. Subsequently she has then a second issue in the distal thigh as well as the right knee location. These are due to a unusual situation where she was actually leaving had just been petting her grand dog when she walked to her car. She tells me that she got dizzy and stumbled and fell and when she fell she called out. When she did this subsequently her grand dog ran over  were not sure if that he thought he was playing with her what but nonetheless ended up biting her on the left thigh as well as on the right knee. Here she had 2 areas of laceration which she subsequently went to the ER for and they attempted to suture both closed. Unfortunately that just did not take. At this point [name] think the sutures can need to be removed from both locations and to be honest there is a significant area of necrosis on the left thigh this may need to be cleaned out and away. Fortunately I do not see any signs of systemic infection though locally there is some evidence of infection she has been on Augmentin which just recently switched to doxycycline no cultures been performed at this point. From the standpoint of past medical history the patient does have a history of anemia which they believe is related to B12. She does not have diabetes she does have a history of lupus however. She is a former smoker. 11/7; this is a patient who has a severe dog bite injury on her upper anterior left thigh. Also an area on the right medial knee. We have been seeing her daily and using Dakin's wet-to-dry. [name] was shown yesterday the culture of drainage that was done last week that shows Pseudomonas. She has multiple drug interactions with quinolones therefore [name] gave her a third-generation cephalosporin. HOWEVER apparently the pharmacy would not fill the prescription because of the concerns about allergic reaction to the capsule itself related to beef products. [name] could not find this anywhere even on Google however she has taken capsule antibiotics in the past and [name] told her [name] think that this risk is small compared to not treating the Pseudomonas. She has not been systemically unwell she is in some discomfort 09-04-2022 upon evaluation today patient unfortunately he is continuing to have some significant  issues here with her leg although I do feel like she is better than she was last week. Fortunately  there does not appear to be any evidence of systemic infection though locally we definitely noted Pseudomonas as the causative agent on the left thigh location with a culture that needed from the deep tissue wound area. Subsequently the only active antibiotics that are going to take care of this are Cipro, Levaquin, and Baxdela. Unfortunately the Cipro and Levaquin cannot be taken by her due to the interaction with Plaquenil causing a QT prolongation which is good to be a significant issue. I do not want that to be a complicating factor and therefore we need to look toward the [place] which [name] understand is definitely expensive but [name] am not sure if there is another option to be perfectly honest. [name] discussed this with the patient today and she is in agreement with plan we have also made a referral to infectious disease though if we can get this treated with Baxdela then she may not even end up requiring the infectious disease referral. Upon inspection patient's wound bed actually showed signs of 09-11-2022 upon evaluation today patient appears to be doing well currently in regard to her wound. She is actually making progress and we actually did find out today that we did get the [place] approved. This is great news as [name] think it will probably prevent her from having to go to the infectious disease doctors will get the infection under control as it stands right now. She is very pleased to hear this and [name] likewise [name] am extremely pleased. It still can be quite pricey it is good to be $350 with her insurance but that is better than close to 2000 without. LAURIANA, DENES [name] ([id number]) [id number] Page 6 of 8 Objective Constitutional Well-nourished and well-hydrated in no acute distress. Vitals Time Taken: 8:08 AM, Temperature: 97.9 F, Pulse: 94 bpm, Respiratory Rate: 18 breaths/min, Blood Pressure: 124/76 mmHg. Respiratory normal breathing without  difficulty. Psychiatric this patient is able to make decisions and demonstrates good insight into disease process. Alert and Oriented x 3. pleasant and cooperative. General Notes: Upon inspection patient's wound bed actually showed signs of good granulation and epithelization at this point. Fortunately there does not appear to be any signs of infection which is great news and overall [name] am extremely pleased with where we stand currently. Integumentary (Hair, Skin) Wound #1 status is Open. Original cause of wound was Trauma. The date acquired was: 08/11/2022. The wound has been in treatment 2 weeks. The wound is located on the Left,Proximal Upper Leg. The wound measures 0.1cm length x 0.1cm width x 0.1cm depth; 0.008cm^2 area and 0.001cm^3 volume. There is Fat Layer (Subcutaneous Tissue) exposed. There is no tunneling or undermining noted. There is a none present amount of drainage noted. There is large (67-100%) pink granulation within the wound bed. There is no necrotic tissue within the wound bed. Wound #2 status is Open. Original cause of wound was Trauma. The date acquired was: 08/11/2022. The wound has been in treatment 2 weeks. The wound is located on the Left,Distal Upper Leg. The wound measures 7cm length x 11cm width x 1.5cm depth; 60.476cm^2 area and 90.713cm^3 volume. There is Fat Layer (Subcutaneous Tissue) exposed. There is no undermining noted, however, there is tunneling at 12:00 with a maximum distance of 6cm. There is a medium amount of serosanguineous drainage noted. There is large (67-100%) pink granulation within the wound bed. There is a small (  1-33%) amount of necrotic tissue within the wound bed including Adherent Slough. Wound #3 status is Open. Original cause of wound was Trauma. The date acquired was: 08/11/2022. The wound has been in treatment 2 weeks. The wound is located on the Right Knee. The wound measures 3.8cm length x 6cm width x 0.9cm depth; 17.907cm^2 area and  16.116cm^3 volume. There is Fat Layer (Subcutaneous Tissue) exposed. There is no tunneling or undermining noted. There is a large amount of serosanguineous drainage noted. There is large (67- 100%) granulation within the wound bed. There is a small (1-33%) amount of necrotic tissue within the wound bed including Eschar and Adherent Slough. 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 Nurse Visit as needed - daily unless seen by MD Anesthetic (Use 'Patient Medications' Section for Anesthetic Order Entry): Lidocaine applied to wound bed Edema Control - Lymphedema / Segmental Compressive Device / Other: Elevate, Exercise Daily and Avoid Standing for Long Periods of Time. Elevate legs to the level of the heart and pump ankles as often as possible Elevate leg(s) parallel to the floor when sitting. Medications-Please add to medication list.: P.O. Antibiotics - continue antibiotics when available WOUND #1: - Upper Leg Wound Laterality: Left, Proximal Peri-Wound Care: Skin Prep 1 x Per Day/30 Days Discharge Instructions: Use skin prep as directed Topical: Gentamicin 1 x Per Day/30 Days Discharge Instructions: Apply as directed by provider. Prim Dressing: Xeroform 4x4-HBD (in/in) 1 x Per Day/30 Days ary Discharge Instructions: Apply Xeroform 4x4-HBD (in/in) as directed Secondary Dressing: ABD Pad 5x9 (in/in) 1 x Per Day/30 Days Discharge Instructions: Cover with ABD pad CHAMIA, SCHMUTZ [name] ([id number]) [id number] Page 7 of 8 Secured With: Medipore T - 18M Medipore H Soft Cloth Surgical T ape ape, 2x2 (in/yd) 1 x Per Day/30 Days WOUND #2: - Upper Leg Wound Laterality: Left, Distal Peri-Wound Care: Skin Prep 1 x Per Day/30 Days Discharge Instructions: Use skin prep as directed Topical: Gentamicin 1 x Per Day/30 Days Discharge Instructions: Apply as directed by provider. Prim Dressing: Gauze 1 x Per Day/30 Days ary Discharge Instructions: moistened with Dakins Solution Prim Dressing: hydrofera blue rope 1 x Per Day/30 Days ary Discharge Instructions: change 3 times per week one rope coated with gentamycininsert into tunnel area at 12 oclock Secondary Dressing: ABD Pad 5x9 (in/in) 1 x Per Day/30 Days Discharge Instructions: Cover with ABD pad Secured With: Medipore T - 18M Medipore H Soft Cloth Surgical T ape ape, 2x2 (in/yd) 1 x Per Day/30 Days WOUND #3: - Knee Wound Laterality: Right Peri-Wound Care: Skin Prep 3 x Per Week/30 Days Discharge Instructions: Use skin prep as directed Topical: Gentamicin 3 x Per Week/30 Days Discharge Instructions: Apply as directed by provider. Prim Dressing: Hydrofera Blue Ready Transfer Foam, 4x5 (in/in) 3 x Per Week/30 Days ary Discharge Instructions: Apply Hydrofera Blue Ready to wound bed as directed Secondary Dressing: (BORDER) Zetuvit Plus SILICONE BORDER Dressing 5x5 (in/in) 3 x Per Week/30 Days Discharge Instructions: Please do not put silicone bordered dressings under wraps. Use non-bordered dressing only. 1. [name] am going to suggest  that we have the patient continue to monitor for any evidence of worsening or infection. Obviously based on what we are seeing I do feel like that we are making good progress here. The biggest issue simply is that [name] think is going to take time to get the area of undermining to fill and [name] think it is making leaps and bounds as far as that is concerned but I do believe  that the back still is going make a big difference she is can to be picking that up tomorrow. 2. [name] am good recommend as well patient should continue to elevate her legs much as possible. Right now she is out of work and [name] am keeping her out of work for a bit longer. With that being said she may end up being ready to go back to work, next visit that [name] see here but again we will see how things proceed. She voiced understanding. We will see patient back for reevaluation in 1 week here in the clinic. If anything worsens or changes patient will contact our office for additional recommendations. Electronic Signature(s) Signed: 09/11/2022 4:39:51 PM By: [name] PA-C Entered By: [name] on 09/11/2022 16:39:51 -------------------------------------------------------------------------------- SuperBill Details Patient Name: Date of Service: Kristine [name]. 09/11/2022 Medical Record Number: [id number] Patient Account Number: [id number] Date of Birth/Sex: Treating RN: [birth year] (66 y.o. 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 B12 deficiency anemia M32.8 Other forms of systemic lupus erythematosus VIRIGINIA, AMENDOLA [name] ([id number]) [id number] Page 8 of 8 Facility Procedures : CPT4 Code:  [id number] Description: 99214 - WOUND CARE VISIT-LEV 4 EST PT Modifier: Quantity: 1 Physician Procedures : CPT4 Code Description Modifier [id number] - WC PHYS LEVEL 3 - EST PT ICD-10 Diagnosis Description W54.0XXA Bitten by dog, initial encounter L03.116 Cellulitis of left lower limb S80.12XA Contusion of left lower leg, initial encounter T79.2XXA  Traumatic secondary and recurrent hemorrhage and seroma, initial encounter Quantity: 1 Electronic Signature(s) Signed: 09/11/2022 4:40:35 PM By: [name] PA-C Previous Signature: 09/11/2022 8:40:36 AM Version By: Carlene Coria RN Entered By: [name] on 09/11/2022 16:40:34

## 2022-09-12 DIAGNOSIS — L98493 Non-pressure chronic ulcer of skin of other sites with necrosis of muscle: Secondary | ICD-10-CM | POA: Diagnosis not present

## 2022-09-12 DIAGNOSIS — D513 Other dietary vitamin B12 deficiency anemia: Secondary | ICD-10-CM | POA: Diagnosis not present

## 2022-09-12 DIAGNOSIS — L97812 Non-pressure chronic ulcer of other part of right lower leg with fat layer exposed: Secondary | ICD-10-CM | POA: Diagnosis not present

## 2022-09-12 DIAGNOSIS — M328 Other forms of systemic lupus erythematosus: Secondary | ICD-10-CM | POA: Diagnosis not present

## 2022-09-12 NOTE — Progress Notes (Signed)
LIA, VIGILANTE I ([id number]) [id number] Page 1 of 7 Visit Report for 09/12/2022 Arrival Information Details Patient Name: Date of Service: [name] 09/12/2022 11:15 A M Medical Record Number: [id number] Patient Account Number: [id number] Date of Birth/Sex: Treating RN: 1956-02-17 (66 y.o. [name], [name] Primary Care Emillio Ngo: Jon Billings Other Clinician: Massie Kluver Referring Shykeem Resurreccion: Treating Clarrisa Kaylor/Extender: Venia Minks in Treatment: 2 Visit Information History Since Last Visit Added or deleted any medications: No Patient Arrived: Ambulatory Has Dressing in Place as Prescribed: Yes Arrival Time: 11:30 Pain Present Now: No Transfer Assistance: None Patient Identification Verified: Yes Secondary Verification Process Completed: Yes Patient Requires Transmission-Based Precautions: No Patient Has Alerts: Yes Patient Alerts: NOT Diabetic Electronic Signature(s) Signed: 09/12/2022 12:31:55 PM By: Gretta Cool, BSN, RN, CWS, [name] RN, BSN Entered By: Gretta Cool, BSN, RN, CWS, [name] on 09/12/2022 12:31:55 -------------------------------------------------------------------------------- Clinic Level of Care Assessment Details Patient Name: Date of Service: [name] 09/12/2022 11:15 A M Medical Record Number: [id number] Patient Account Number: [id number] Date of Birth/Sex: Treating RN: Mar 16, 1956 (66 y.o. [name] Primary Care Kessa Fairbairn: Jon Billings Other Clinician: Massie Kluver Referring Charliee Krenz: Treating Amarylis Rovito/Extender: Venia Minks in Treatment: 2 Clinic Level of Care Assessment Items TOOL 4 Quantity Score '[]'$  - 0 Use when only an EandM is performed on FOLLOW-UP visit ASSESSMENTS - Nursing Assessment / Reassessment '[]'$  - 0 Reassessment of Co-morbidities (includes updates in patient status) '[]'$  - 0 Reassessment of Adherence to Treatment Plan ASSESSMENTS - Wound and  Skin A ssessment / Reassessment '[]'$  - 0 Simple Wound Assessment / Reassessment - one wound X- 2 5 Complex Wound Assessment / Reassessment - multiple wounds TRANICE, LADUKE I ([id number]) [id number] Page 2 of 7 '[]'$  - 0 Dermatologic / Skin Assessment (not related to wound area) ASSESSMENTS - Focused Assessment '[]'$  - 0 Circumferential Edema Measurements - multi extremities '[]'$  - 0 Nutritional Assessment / Counseling / Intervention '[]'$  - 0 Lower Extremity Assessment (monofilament, tuning fork, pulses) '[]'$  - 0 Peripheral Arterial Disease Assessment (using hand held doppler) ASSESSMENTS - Ostomy and/or Continence Assessment and Care '[]'$  - 0 Incontinence Assessment and Management '[]'$  - 0 Ostomy Care Assessment and Management (repouching, etc.) PROCESS - Coordination of Care X - Simple Patient / Family Education for ongoing care 1 15 '[]'$  - 0 Complex (extensive) Patient / Family Education for ongoing care '[]'$  - 0 Staff obtains Programmer, systems, Records, T Results / Process Orders est '[]'$  - 0 Staff telephones HHA, Nursing Homes / Clarify orders / etc '[]'$  - 0 Routine Transfer to another Facility (non-emergent condition) '[]'$  - 0 Routine Hospital Admission (non-emergent condition) '[]'$  - 0 New Admissions / Biomedical engineer / Ordering NPWT Apligraf, etc. , '[]'$  - 0 Emergency Hospital Admission (emergent condition) X- 1 10 Simple Discharge Coordination '[]'$  - 0 Complex (extensive) Discharge Coordination PROCESS - Special Needs '[]'$  - 0 Pediatric / Minor Patient Management '[]'$  - 0 Isolation Patient Management '[]'$  - 0 Hearing / Language / Visual special needs '[]'$  - 0 Assessment of Community assistance (transportation, D/C planning, etc.) '[]'$  - 0 Additional assistance / Altered mentation '[]'$  - 0 Support Surface(s) Assessment (bed, cushion, seat, etc.) INTERVENTIONS - Wound Cleansing / Measurement '[]'$  - 0 Simple Wound Cleansing - one wound X- 2 5 Complex Wound Cleansing -  multiple wounds X- 1 5 Wound Imaging (photographs - any number of wounds) '[]'$  - 0 Wound Tracing (instead of photographs) '[]'$  - 0 Simple Wound Measurement - one wound X- 2 5  Complex Wound Measurement - multiple wounds INTERVENTIONS - Wound Dressings '[]'$  - 0 Small Wound Dressing one or multiple wounds X- 1 15 Medium Wound Dressing one or multiple wounds X- 1 20 Large Wound Dressing one or multiple wounds '[]'$  - 0 Application of Medications - topical '[]'$  - 0 Application of Medications - injection INTERVENTIONS - Miscellaneous '[]'$  - 0 External ear exam '[]'$  - 0 Specimen Collection (cultures, biopsies, blood, body fluids, etc.) '[]'$  - 0 Specimen(s) / Culture(s) sent or taken to Lab for analysis IZABELLA, MARCANTEL I ([id number]) [id number] Page 3 of 7 '[]'$  - 0 Patient Transfer (multiple staff / Civil Service fast streamer / Similar devices) '[]'$  - 0 Simple Staple / Suture removal (25 or less) '[]'$  - 0 Complex Staple / Suture removal (26 or more) '[]'$  - 0 Hypo / Hyperglycemic Management (close monitor of Blood Glucose) '[]'$  - 0 Ankle / Brachial Index (ABI) - do not check if billed separately '[]'$  - 0 Vital Signs Has the patient been seen at the hospital within the last three years: Yes Total Score: 95 Level Of Care: New/Established - Level 3 Electronic Signature(s) Unsigned Entered By: Gretta Cool, BSN, RN, CWS, [name] on 09/12/2022 12:34:02 -------------------------------------------------------------------------------- Encounter Discharge Information Details Patient Name: Date of Service: [name] 09/12/2022 11:15 A M Medical Record Number: [id number] Patient Account Number: [id number] Date of Birth/Sex: Treating RN: 1955-11-08 (66 y.o. [name] Primary Care Slayton Lubitz: Jon Billings Other Clinician: Massie Kluver Referring Terie Lear: Treating Taryll Reichenberger/Extender: Venia Minks in Treatment: 2 Encounter Discharge Information Items Discharge  Condition: Stable Ambulatory Status: Ambulatory Discharge Destination: Home Transportation: Private Auto Schedule Follow-up Appointment: Yes Clinical Summary of Care: Electronic Signature(s) Signed: 09/12/2022 12:33:09 PM By: Gretta Cool, BSN, RN, CWS, [name] RN, BSN Entered By: Gretta Cool, BSN, RN, CWS, [name] on 09/12/2022 12:33:09 -------------------------------------------------------------------------------- Wound Assessment Details Patient Name: Date of Service: [name] 09/12/2022 11:15 A M Medical Record Number: [id number] Patient Account Number: [id number] Date of Birth/Sex: Treating RN: 12/16/1955 (66 y.o. [name] Primary Care Ladeja Pelham: Jon Billings Other Clinician: Olene, Godfrey I ([id number]) [phone].pdf Page 4 of 7 Referring Evangelynn Lochridge: Treating Queena Monrreal/Extender: Venia Minks in Treatment: 2 Wound Status Wound Number: 1 Primary Etiology: Trauma, Other Wound Location: Left, Proximal Upper Leg Wound Status: Open Wounding Event: Trauma Date Acquired: 08/11/2022 Weeks Of Treatment: 2 Clustered Wound: Yes Wound Measurements Length: (cm) 0.1 Width: (cm) 0.1 Depth: (cm) 0.1 Area: (cm) 0.008 Volume: (cm) 0.001 % Reduction in Area: 99.9% % Reduction in Volume: 100% Wound Description Classification: Full Thickness Without Exposed Support S Exudate Amount: None Present tructures Treatment Notes Wound #1 (Upper Leg) Wound Laterality: Left, Proximal Cleanser Peri-Wound Care Skin Prep Discharge Instruction: Use skin prep as directed Topical Gentamicin Discharge Instruction: Apply as directed by Khiem Gargis. Primary Dressing Xeroform 4x4-HBD (in/in) Discharge Instruction: Apply Xeroform 4x4-HBD (in/in) as directed Secondary Dressing ABD Pad 5x9 (in/in) Discharge Instruction: Cover with ABD pad Secured With Medipore T - 27M Medipore H Soft Cloth Surgical T ape ape, 2x2 (in/yd) Compression  Wrap Compression Stockings Add-Ons Electronic Signature(s) Unsigned Entered By: Gretta Cool, BSN, RN, CWS, [name] on 09/12/2022 12:32:21 -------------------------------------------------------------------------------- Wound Assessment Details Patient Name: Date of Service: [name] 09/12/2022 11:15 A MOLLEE, NEER I ([id number]) [id number] Page 5 of 7 Medical Record Number: [id number] Patient Account Number: [id number] Date of Birth/Sex: Treating RN: [birth year] (66 y.o. [name] Primary Care Thierno Hun: Jon Billings Other Clinician: Massie Kluver Referring Corian Handley: Treating Darragh Nay/Extender: Joaquim Lai,  Arvella Nigh, [name] Weeks in Treatment: 2 Wound Status Wound Number: 2 Primary Etiology: Trauma, Other Wound Location: Left, Distal Upper Leg Wound Status: Open Wounding Event: Trauma Date Acquired: 08/11/2022 Weeks Of Treatment: 2 Clustered Wound: No Wound Measurements Length: (cm) 7 Width: (cm) 11 Depth: (cm) 1.5 Area: (cm) 60.476 Volume: (cm) 90.713 % Reduction in Area: 38.9% % Reduction in Volume: 81.7% Wound Description Classification: Full Thickness With Exposed Suppor Exudate Amount: Medium Exudate Type: Serosanguineous Exudate Color: red, brown t Structures Treatment Notes Wound #2 (Upper Leg) Wound Laterality: Left, Distal Cleanser Peri-Wound Care Skin Prep Discharge Instruction: Use skin prep as directed Topical Gentamicin Discharge Instruction: Apply as directed by Tanea Moga. Primary Dressing Gauze Discharge Instruction: moistened with Dakins Solution hydrofera blue rope Discharge Instruction: change 3 times per week one rope coated with gentamycininsert into tunnel area at 12 oclock Secondary Dressing ABD Pad 5x9 (in/in) Discharge Instruction: Cover with ABD pad Secured With Spencer H Soft Cloth Surgical T ape ape, 2x2 (in/yd) Compression Wrap Compression  Stockings Add-Ons Electronic Signature(s) Unsigned Entered By: Gretta Cool, BSN, RN, CWS, [name] on 09/12/2022 12:32:21 Signature(s): IJANAE, MACAPAGAL I ([id number]) [id number] Date(s): [id number] Page 6 of 7 -------------------------------------------------------------------------------- Wound Assessment Details Patient Name: Date of Service: [name] 09/12/2022 11:15 A M Medical Record Number: [id number] Patient Account Number: [id number] Date of Birth/Sex: Treating RN: 1956/05/14 (66 y.o. [name], [name] Primary Care Laycie Schriner: Jon Billings Other Clinician: Massie Kluver Referring Zaliah Wissner: Treating Aerik Polan/Extender: Venia Minks in Treatment: 2 Wound Status Wound Number: 3 Primary Etiology: Trauma, Other Wound Location: Right Knee Wound Status: Open Wounding Event: Trauma Date Acquired: 08/11/2022 Weeks Of Treatment: 2 Clustered Wound: No Wound Measurements Length: (cm) 3.8 Width: (cm) 5 Depth: (cm) 0.9 Area: (cm) 14.923 Volume: (cm) 13.43 % Reduction in Area: -26.7% % Reduction in Volume: 32.9% Wound Description Classification: Full Thickness Without Exposed Support Exudate Amount: Large Exudate Type: Serosanguineous Exudate Color: red, brown Structures Treatment Notes Wound #3 (Knee) Wound Laterality: Right Cleanser Peri-Wound Care Skin Prep Discharge Instruction: Use skin prep as directed Topical Gentamicin Discharge Instruction: Apply as directed by Jannette Cotham. Primary Dressing Hydrofera Blue Ready Transfer Foam, 4x5 (in/in) Discharge Instruction: Apply Hydrofera Blue Ready to wound bed as directed Secondary Dressing (BORDER) Zetuvit Plus SILICONE BORDER Dressing 5x5 (in/in) Discharge Instruction: Please do not put silicone bordered dressings under wraps. Use non-bordered dressing only. Secured With Compression Wrap Compression Stockings Add-Ons Electronic Signature(s) Unsigned Entered By: Gretta Cool,  BSN, RN, CWS, [name] on 09/12/2022 12:32:21 LETONYA, MANGELS I ([id number]) [id number] Page 7 of 7 Signature(s): Date(s):

## 2022-09-12 NOTE — Telephone Encounter (Signed)
[name] said they were missing the doctor portion of the form so filled it out and faxed it to them.

## 2022-09-17 ENCOUNTER — Encounter: Payer: Medicare Other | Admitting: Physician Assistant

## 2022-09-17 ENCOUNTER — Ambulatory Visit (INDEPENDENT_AMBULATORY_CARE_PROVIDER_SITE_OTHER): Payer: Medicare Other

## 2022-09-17 DIAGNOSIS — M328 Other forms of systemic lupus erythematosus: Secondary | ICD-10-CM | POA: Diagnosis not present

## 2022-09-17 DIAGNOSIS — D519 Vitamin B12 deficiency anemia, unspecified: Secondary | ICD-10-CM | POA: Diagnosis not present

## 2022-09-17 DIAGNOSIS — D513 Other dietary vitamin B12 deficiency anemia: Secondary | ICD-10-CM | POA: Diagnosis not present

## 2022-09-17 DIAGNOSIS — L98493 Non-pressure chronic ulcer of skin of other sites with necrosis of muscle: Secondary | ICD-10-CM | POA: Diagnosis not present

## 2022-09-17 DIAGNOSIS — L97812 Non-pressure chronic ulcer of other part of right lower leg with fat layer exposed: Secondary | ICD-10-CM | POA: Diagnosis not present

## 2022-09-17 DIAGNOSIS — S81001A Unspecified open wound, right knee, initial encounter: Secondary | ICD-10-CM | POA: Diagnosis not present

## 2022-09-17 NOTE — Progress Notes (Signed)
[name], [name] ([id number]) [id number] Page 1 of 9 Visit Report for 09/17/2022 Chief Complaint Document Details Patient Name: Date of Service: Kristine Mater [name]. 09/17/2022 1:30 PM Medical Record Number: [id number] Patient Account Number: [id number] Date of Birth/Sex: Treating RN: 10-31-1955 (66 y.o. [name] Primary Care Provider: Jon Billings Other Clinician: Massie Kluver Referring Provider: Treating Provider/Extender: Venia Minks in Treatment: 3 Information Obtained from: Patient Chief Complaint Right knee and left thigh ulcers secondary to a dog bite and fall Electronic Signature(s) Signed: 09/17/2022 1:49:02 PM By: [name] PA-C Entered By: [name] on 09/17/2022 13:49:02 -------------------------------------------------------------------------------- Debridement Details Patient Name: Date of Service: Kristine Mater [name]. 09/17/2022 1:30 PM Medical Record Number: [id number] Patient Account Number: [id number] Date of Birth/Sex: Treating RN: 08-04-1956 (66 y.o. [name] Primary Care Provider: Jon Billings Other Clinician: Massie Kluver Referring Provider: Treating Provider/Extender: Venia Minks in Treatment: 3 Debridement Performed for Assessment: Wound #3 Right Knee Performed By: Physician [name] Sams., PA-C Debridement Type: Debridement Level of Consciousness (Pre-procedure): Awake and Alert Pre-procedure Verification/Time Out Yes - 14:25 Taken: Start Time: 14:25 T Area Debrided (L x W): otal 3 (cm) x 5.5 (cm) = 16.5 (cm) Tissue and other material debrided: Viable, Non-Viable, Slough, Subcutaneous, Slough Level: Skin/Subcutaneous Tissue Debridement Description: Excisional Instrument: Curette Bleeding: Minimum Hemostasis Achieved: Pressure Response to Treatment: Procedure was tolerated well Level of Consciousness (Post- Awake and  Alert procedure): Post Debridement Measurements of Total Wound [name], [name] ([id number]) [id number] Page 2 of 9 Length: (cm) 3 Width: (cm) 5.5 Depth: (cm) 0.3 Volume: (cm) 3.888 Character of Wound/Ulcer Post Debridement: Stable Post Procedure Diagnosis Same as Pre-procedure Electronic Signature(s) Signed: 09/17/2022 4:14:29 PM By: [name] PA-C Signed: 09/17/2022 5:11:01 PM By: Massie Kluver Signed: 09/18/2022 4:00:23 PM By: Carlene Coria RN Entered By: Massie Kluver on 09/17/2022 14:26:46 -------------------------------------------------------------------------------- HPI Details Patient Name: Date of Service: Kristine Mater [name]. 09/17/2022 1:30 PM Medical Record Number: [id number] Patient Account Number: [id number] Date of Birth/Sex: Treating RN: October 21, 1956 (66 y.o. [name] Primary Care Provider: Jon Billings Other Clinician: Massie Kluver Referring Provider: Treating Provider/Extender: Venia Minks in Treatment: 3 History of Present Illness HPI Description: 08-24-2022 patient presents today for initial evaluation here in our clinic concerning issues that she has been having with wounds in 3 locations. 1 areas in the proximal left thigh this is due to a fall that she sustained on 08-11-2022. Subsequently she has then a second issue in the distal thigh as well as the right knee location. These are due to a unusual situation where she was actually leaving had just been petting her grand dog when she walked to her car. She tells me that she got dizzy and stumbled and fell and when she fell she called out. When she did this subsequently her grand dog ran over were not sure if that he thought he was playing with her what but nonetheless ended up biting her on the left thigh as well as on the right knee. Here she had 2 areas of laceration which she subsequently went to the ER for and they attempted to  suture both closed. Unfortunately that just did not take. At this point [name] think the sutures can need to be removed from both locations and to be honest there is a significant area of necrosis on the left thigh this may need to be cleaned out and away. Fortunately I do  not see any signs of systemic infection though locally there is some evidence of infection she has been on Augmentin which just recently switched to doxycycline no cultures been performed at this point. From the standpoint of past medical history the patient does have a history of anemia which they believe is related to B12. She does not have diabetes she does have a history of lupus however. She is a former smoker. 11/7; this is a patient who has a severe dog bite injury on her upper anterior left thigh. Also an area on the right medial knee. We have been seeing her daily and using Dakin's wet-to-dry. [name] was shown yesterday the culture of drainage that was done last week that shows Pseudomonas. She has multiple drug interactions with quinolones therefore [name] gave her a third-generation cephalosporin. HOWEVER apparently the pharmacy would not fill the prescription because of the concerns about allergic reaction to the capsule itself related to beef products. [name] could not find this anywhere even on Google however she has taken capsule antibiotics in the past and [name] told her [name] think that this risk is small compared to not treating the Pseudomonas. She has not been systemically unwell she is in some discomfort 09-04-2022 upon evaluation today patient unfortunately he is continuing to have some significant issues here with her leg although I do feel like she is better than she was last week. Fortunately there does not appear to be any evidence of systemic infection though locally we definitely noted Pseudomonas as the causative agent on the left thigh location with a culture that needed from the deep tissue wound area. Subsequently the only active  antibiotics that are going to take care of this are Cipro, Levaquin, and Baxdela. Unfortunately the Cipro and Levaquin cannot be taken by her due to the interaction with Plaquenil causing a QT prolongation which is good to be a significant issue. I do not want that to be a complicating factor and therefore we need to look toward the [place] which [name] understand is definitely expensive but [name] am not sure if there is another option to be perfectly honest. [name] discussed this with the patient today and she is in agreement with plan we have also made a referral to infectious disease though if we can get this treated with Baxdela then she may not even end up requiring the infectious disease referral. Upon inspection patient's wound bed actually showed signs of 09-11-2022 upon evaluation today patient appears to be doing well currently in regard to her wound. She is actually making progress and we actually did find out today that we did get the [place] approved. This is great news as [name] think it will probably prevent her from having to go to the infectious disease doctors will get the infection under control as it stands right now. She is very pleased to hear this and [name] likewise [name] am extremely pleased. It still can be quite pricey it is good to be $350 with her insurance but that is better than close to 2000 without. 09-17-2022 upon evaluation today patient appears to be doing well currently in regard to her wounds. She has been tolerating the dressing changes without complication. Fortunately I do not see any evidence of infection at this point which is great news we still had a tremendously difficult period of time trying to get the [place] approved and then once we got it approved apparently the pharmacy was unable to actually order the medication. This has been an extremely  frustrating process in general. The patient has been extremely frustrated. 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 Nonetheless I do believe that this is still the best way to go with the topical gentamicin. BICH, MCHANEY [name] ([id number]) [id number] Page 3 of 9 Electronic Signature(s) Signed: 09/17/2022 3:49:20 PM By: [name] PA-C Previous Signature: 09/17/2022 3:49:01 PM Version By: [name] PA-C Entered By: [name] on 09/17/2022 15:49:20 -------------------------------------------------------------------------------- Physical Exam Details Patient Name: Date of Service: Kristine Mater [name]. 09/17/2022 1:30 PM Medical Record Number: [id number] Patient Account Number: [id number] Date of Birth/Sex: Treating RN: November 17, 1955 (66 y.o. [name] Primary Care Provider: Jon Billings Other Clinician: Massie Kluver Referring Provider: Treating Provider/Extender: Venia Minks in Treatment: 3 Constitutional Well-nourished and well-hydrated in no acute distress. Respiratory normal breathing without difficulty. Psychiatric this patient is able to make decisions and demonstrates good insight into disease process. Alert and Oriented x 3. pleasant and cooperative. Notes Patient has been utilizing Hydrofera Blue on the right leg at the knee area which seems to be doing quite well. With that being said the patient also has been tolerating the dressing changes without complication and overall [name] am extremely pleased with where we stand today. Electronic Signature(s) Signed: 09/17/2022 3:49:45 PM By: [name] PA-C Entered By: [name] on 09/17/2022 15:49:45 -------------------------------------------------------------------------------- Physician Orders Details Patient Name: Date of Service: Kristine Mater [name]. 09/17/2022  1:30 PM Medical Record Number: [id number] Patient Account Number: [id number] Date of Birth/Sex: Treating RN: [birth year] (66 y.o. [name] Primary Care Provider: Jon Billings Other Clinician: Massie Kluver Referring Provider: Treating Provider/Extender: Venia Minks in Treatment: 3 Verbal / Phone Orders: No Diagnosis Coding ICD-10 Coding Code Description LOLITHA, TORTORA [name] ([id number]) [id number] Page 4 of 9 W54.0XXA Bitten by dog, initial encounter L03.116 Cellulitis of left lower limb S80.12XA Contusion of left lower leg, initial encounter T79.2XXA Traumatic secondary and recurrent hemorrhage and seroma, initial encounter S80.812A Abrasion, left lower leg, initial encounter L97.823 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 Nurse Visit as needed - daily unless seen by MD Anesthetic (Use 'Patient Medications' Section for Anesthetic Order Entry) Lidocaine applied to wound bed Edema Control - Lymphedema / Segmental Compressive Device / Other Elevate, Exercise Daily and A void Standing for Long Periods of Time. Elevate legs to the level of the heart and pump ankles as often as possible Elevate leg(s) parallel to the floor when sitting. Medications-Please add to medication list. ntibiotic - apply thin layer of Gentamycin to wound Topical A Wound Treatment Wound #2 - Upper Leg Wound Laterality: Left, Distal Peri-Wound Care: Skin Prep 1 x Per Day/30 Days Discharge Instructions: Use skin prep as directed Topical: Gentamicin 1 x Per Day/30 Days Discharge Instructions: Apply as directed by provider. Prim Dressing: Gauze 1 x Per Day/30  Days ary Discharge Instructions: moistened with Dakins Solution Prim Dressing: Prisma 4.34 (in) 1 x Per Day/30 Days ary Discharge Instructions: pack dry prisma into tunnel Secondary  Dressing: ABD Pad 5x9 (in/in) 1 x Per Day/30 Days Discharge Instructions: Cover with ABD pad Secured With: Medipore T - 58M Medipore H Soft Cloth Surgical T ape ape, 2x2 (in/yd) 1 x Per Day/30 Days Wound #3 - Knee Wound Laterality: Right Peri-Wound Care: Skin Prep 3 x Per Week/30 Days Discharge Instructions: Use skin prep as directed Topical: Gentamicin 3 x Per Week/30 Days Discharge Instructions: Apply as directed by provider. Prim Dressing: Hydrofera Blue Ready Transfer Foam, 4x5 (in/in) 3 x Per Week/30 Days ary Discharge Instructions: Apply Hydrofera Blue Ready to wound bed as directed Secondary Dressing: (BORDER) Zetuvit Plus SILICONE BORDER Dressing 5x5 (in/in) 3 x Per Week/30 Days Discharge Instructions: Please do not put silicone bordered dressings under wraps. Use non-bordered dressing only. Electronic Signature(s) Signed: 09/17/2022 4:14:29 PM By: [name] PA-C Signed: 09/17/2022 5:11:01 PM By: Massie Kluver Entered By: Massie Kluver on 09/17/2022 14:35:06 [name] ([id number]) [id number] Page 5 of 9 -------------------------------------------------------------------------------- Problem List Details Patient Name: Date of Service: Kristine Mater [name]. 09/17/2022 1:30 PM Medical Record Number: [id number] Patient Account Number: [id number] Date of Birth/Sex: Treating RN: 08/30/1956 (66 y.o. [name] Primary Care Provider: Jon Billings Other Clinician: Massie Kluver Referring Provider: Treating Provider/Extender: Venia Minks in Treatment: 3 Active Problems ICD-10 Encounter Code Description Active Date MDM Diagnosis W54.0XXA Bitten by dog, initial encounter 08/24/2022 No Yes L03.116 Cellulitis of left lower  limb 09/04/2022 No Yes S80.12XA Contusion of left lower leg, initial encounter 08/24/2022 No Yes T79.2XXA Traumatic secondary and recurrent hemorrhage and seroma, initial encounter 08/24/2022 No Yes S80.812A Abrasion, left lower leg, initial encounter 08/24/2022 No Yes L97.823 Non-pressure chronic ulcer of other part of left lower leg with necrosis of 08/24/2022 No Yes muscle L98.492 Non-pressure chronic ulcer of skin of other sites with fat layer exposed 08/24/2022 No Yes D51.3 Other dietary vitamin B12 deficiency anemia 08/24/2022 No Yes M32.8 Other forms of systemic lupus erythematosus 08/24/2022 No Yes Inactive Problems Resolved Problems Electronic Signature(s) Signed: 09/17/2022 1:33:53 PM By: [name] PA-C Entered By: [name] on 09/17/2022 13:33:53 [name] ([id number]) [id number] Page 6 of 9 -------------------------------------------------------------------------------- Progress Note Details Patient Name: Date of Service: Kristine Mater [name]. 09/17/2022 1:30 PM Medical Record Number: [id number] Patient Account Number: [id number] Date of Birth/Sex: Treating RN: 1956/06/17 (66 y.o. [name] Primary Care Provider: Jon Billings Other Clinician: Massie Kluver Referring Provider: Treating Provider/Extender: Venia Minks in Treatment: 3 Subjective Chief Complaint Information obtained from Patient Right knee and left thigh ulcers secondary to a dog bite and fall History of Present Illness (HPI) 08-24-2022 patient presents today for initial evaluation here in our clinic concerning issues that she has been having with wounds in 3 locations. 1 areas in the proximal left thigh this is due to a fall that she sustained on 08-11-2022. Subsequently she has then a second issue in the distal thigh as well as the right knee location. These are due to a unusual situation where she was actually leaving had just  been petting her grand dog when she walked to her car. She tells me that she got dizzy and stumbled and fell and when she fell she called out. When she did this subsequently her grand dog ran over were not sure if that he thought he was playing with her what but nonetheless ended up biting her on the left thigh as well as on the right knee. Here she had 2 areas  of laceration which she subsequently went to the ER for and they attempted to suture both closed. Unfortunately that just did not take. At this point [name] think the sutures can need to be removed from both locations and to be honest there is a significant area of necrosis on the left thigh this may need to be cleaned out and away. Fortunately I do not see any signs of systemic infection though locally there is some evidence of infection she has been on Augmentin which just recently switched to doxycycline no cultures been performed at this point. From the standpoint of past medical history the patient does have a history of anemia which they believe is related to B12. She does not have diabetes she does have a history of lupus however. She is a former smoker. 11/7; this is a patient who has a severe dog bite injury on her upper anterior left thigh. Also an area on the right medial knee. We have been seeing her daily and using Dakin's wet-to-dry. [name] was shown yesterday the culture of drainage that was done last week that shows Pseudomonas. She has multiple drug interactions with quinolones therefore [name] gave her a third-generation cephalosporin. HOWEVER apparently the pharmacy would not fill the prescription because of the concerns about allergic reaction to the capsule itself related to beef products. [name] could not find this anywhere even on Google however she has taken capsule antibiotics in the past and [name] told her [name] think that this risk is small compared to not treating the Pseudomonas. She has not been systemically unwell she is in some  discomfort 09-04-2022 upon evaluation today patient unfortunately he is continuing to have some significant issues here with her leg although I do feel like she is better than she was last week. Fortunately there does not appear to be any evidence of systemic infection though locally we definitely noted Pseudomonas as the causative agent on the left thigh location with a culture that needed from the deep tissue wound area. Subsequently the only active antibiotics that are going to take care of this are Cipro, Levaquin, and Baxdela. Unfortunately the Cipro and Levaquin cannot be taken by her due to the interaction with Plaquenil causing a QT prolongation which is good to be a significant issue. I do not want that to be a complicating factor and therefore we need to look toward the [place] which [name] understand is definitely expensive but [name] am not sure if there is another option to be perfectly honest. [name] discussed this with the patient today and she is in agreement with plan we have also made a referral to infectious disease though if we can get this treated with Baxdela then she may not even end up requiring the infectious disease referral. Upon inspection patient's wound bed actually showed signs of 09-11-2022 upon evaluation today patient appears to be doing well currently in regard to her wound. She is actually making progress and we actually did find out today that we did get the [place] approved. This is great news as [name] think it will probably prevent her from having to go to the infectious disease doctors will get the infection under control as it stands right now. She is very pleased to hear this and [name] likewise [name] am extremely pleased. It still can be quite pricey it is good to be $350 with her insurance but that is better than close to 2000 without. 09-17-2022 upon evaluation today patient appears to be doing well currently in regard  to her wounds. She has been tolerating the dressing changes  without complication. Fortunately I do not see any evidence of infection at this point which is great news we still had a tremendously difficult period of time trying to get the [place] approved and then once we got it approved apparently the pharmacy was unable to actually order the medication. This has been an extremely frustrating process in general. The patient has been extremely frustrated. 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 Nonetheless I do believe that this is still the best way to go with the topical gentamicin. Objective Constitutional Well-nourished and well-hydrated in no acute distress. Vitals Time Taken: 1:43 PM, Temperature: 98.4 F, Pulse: 96 bpm, Respiratory Rate: 16 breaths/min, Blood Pressure: 124/71 mmHg. [name], [name] ([id number]) [id number] Page 7 of 9 Respiratory normal breathing without difficulty. Psychiatric this patient is able to make decisions and demonstrates good insight into disease process. Alert and Oriented x 3. pleasant and cooperative. General Notes: Patient has been utilizing Hydrofera Blue on the right leg at the knee area which seems to be doing quite well. With that being said the patient also has been tolerating the dressing changes without complication and overall [name] am extremely pleased with where we stand today. Integumentary (Hair, Skin) Wound #1 status is Healed - Epithelialized. Original cause of wound was Trauma. The date acquired was: 08/11/2022. The wound has been in treatment 3 weeks. The wound is located on the Left,Proximal Upper Leg. The wound measures 0cm length x 0cm width x 0cm depth; 0cm^2 area and 0cm^3 volume. There is a none present amount of drainage noted. There is no granulation  within the wound bed. There is no necrotic tissue within the wound bed. Wound #2 status is Open. Original cause of wound was Trauma. The date acquired was: 08/11/2022. The wound has been in treatment 3 weeks. The wound is located on the Left,Distal Upper Leg. The wound measures 7.8cm length x 9.7cm width x 5cm depth; 59.423cm^2 area and 297.116cm^3 volume. There is Fat Layer (Subcutaneous Tissue) exposed. There is no undermining noted, however, there is tunneling at 11:00 with a maximum distance of 0.9cm. There is additional tunneling and at 12:00 with a maximum distance of 3.4cm. There is a medium amount of serosanguineous drainage noted. Wound #3 status is Open. Original cause of wound was Trauma. The date acquired was: 08/11/2022. The wound has been in treatment 3 weeks. The wound is located on the Right Knee. The wound measures 3cm length x 5.5cm width x 0.3cm depth; 12.959cm^2 area and 3.888cm^3 volume. There is Fat Layer (Subcutaneous Tissue) exposed. There is no tunneling or undermining noted. There is a large amount of serosanguineous drainage noted. 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 There was a Excisional Skin/Subcutaneous Tissue  Debridement with a total area of 16.5 sq cm performed by [name] Sams., PA-C. With the following instrument(s): Curette to remove Viable and Non-Viable tissue/material. Material removed  includes Subcutaneous Tissue and Slough and. A time out was conducted at 14:25, prior to the start of the procedure. A Minimum amount of bleeding was controlled with Pressure. The procedure was tolerated well. Post Debridement Measurements: 3cm length x 5.5cm width x 0.3cm depth; 3.888cm^3 volume. Character of Wound/Ulcer Post Debridement is stable. Post procedure Diagnosis Wound #3: Same as Pre-Procedure Plan Follow-up Appointments: Return Appointment in 1 week. Nurse Visit as needed - daily unless seen by MD Anesthetic (Use 'Patient Medications' Section for Anesthetic Order Entry): Lidocaine applied to wound bed Edema Control - Lymphedema / Segmental Compressive Device / Other: Elevate, Exercise Daily and Avoid Standing for Long Periods of Time. Elevate legs to the level of the heart and pump ankles as often as possible Elevate leg(s) parallel to the floor when sitting. Medications-Please add to medication list.: Topical Antibiotic - apply thin layer of Gentamycin to wound WOUND #2: - Upper Leg Wound Laterality: Left, Distal Peri-Wound Care: Skin Prep 1 x Per Day/30 Days Discharge Instructions: Use skin prep as directed Topical: Gentamicin 1 x Per Day/30 Days Discharge Instructions: Apply as directed by provider. Prim Dressing: Gauze 1 x Per Day/30 Days ary Discharge Instructions: moistened with Dakins Solution Prim Dressing: Prisma 4.34 (in) 1 x Per Day/30 Days ary Discharge Instructions: pack dry prisma into tunnel Secondary Dressing: ABD Pad 5x9 (in/in) 1 x Per Day/30 Days Discharge Instructions: Cover with ABD pad [name], [name] ([id number]) [id number] Page 8 of 9 Secured With: Medipore T - 22M Medipore H Soft Cloth Surgical T ape ape, 2x2 (in/yd) 1 x Per Day/30 Days WOUND #3: - Knee Wound Laterality: Right Peri-Wound Care: Skin Prep 3 x Per Week/30 Days Discharge Instructions: Use skin prep as directed Topical: Gentamicin 3 x Per Week/30  Days Discharge Instructions: Apply as directed by provider. Prim Dressing: Hydrofera Blue Ready Transfer Foam, 4x5 (in/in) 3 x Per Week/30 Days ary Discharge Instructions: Apply Hydrofera Blue Ready to wound bed as directed Secondary Dressing: (BORDER) Zetuvit Plus SILICONE BORDER Dressing 5x5 (in/in) 3 x Per Week/30 Days Discharge Instructions: Please do not put silicone bordered dressings under wraps. Use non-bordered dressing only. 1. [name] am good recommend that since she is doing so well that we just hold off and not have her get the [place] as it is obviously very expensive and to be honest [name] am not even certain it is going to be necessary at this point. Also do not think there is any need for her to see infectious disease at this time based on what they are seeing I do not believe they would do anything different than what we are already doing. 3. [name] am also can recommend that she should continue with the topical gentamicin we are using the [facility] on the right knee area on the left leg were still using the Dakin's moistened gauze that we will get a pack into the undermining area with silver collagen. We will see patient back for reevaluation in 1 week here in the clinic. If anything worsens or changes patient will contact our office for additional recommendations. Electronic Signature(s) Signed: 09/17/2022 3:51:10 PM By: [name] PA-C Previous Signature: 09/17/2022 3:50:25 PM Version By: [name] PA-C Entered By: [name] on 09/17/2022 15:51:09 -------------------------------------------------------------------------------- SuperBill Details Patient Name: Date of Service: [name].  09/17/2022 Medical Record Number: [id number] Patient Account Number: [id number] Date of Birth/Sex: Treating RN: 1956/03/03 (66 y.o. 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.3 Other dietary vitamin B12 deficiency anemia M32.8 Other forms of systemic lupus erythematosus Facility Procedures : CPT4 Code: [id number] Description: 48546 - DEB SUBQ TISSUE 20 SQ CM/< ICD-10 Diagnosis Description L98.492 Non-pressure chronic ulcer of skin of other sites with fat layer exposed Modifier: Quantity: 1 Physician Procedures : CPT4 Code Description Modifier [id number] 11042 - [facility] PHYS SUBQ TISS 20 SQ CM [name], [name] ([id number]) [id number] Pa Quantity: 1 ge 9 of 9 : ICD-10 Diagnosis Description L98.492 Non-pressure chronic ulcer of skin of other sites with fat layer exposed Quantity: Electronic Signature(s) Signed: 09/17/2022 3:51:30 PM By: [name] PA-C Entered By: [name] on 09/17/2022 15:51:30

## 2022-09-18 NOTE — Progress Notes (Signed)
EMONEE, WINKOWSKI [name] ([id number]) [id number] Page 1 of 11 Visit Report for 09/17/2022 Arrival Information Details Patient Name: Date of Service: [name]. 09/17/2022 1:30 PM Medical Record Number: [id number] Patient Account Number: [id number] Date of Birth/Sex: Treating RN: [birth year] (66 y.o. [name] Primary Care Kaylani Fromme: Jon Billings Other Clinician: Massie Kluver Referring Barbaraann Avans: Treating Lawrance Wiedemann/Extender: Venia Minks in Treatment: 3 Visit Information History Since Last Visit All ordered tests and consults were completed: No Patient Arrived: Ambulatory Added or deleted any medications: No Arrival Time: 13:41 Any new allergies or adverse reactions: No Transfer Assistance: None Had a fall or experienced change in No Patient Identification Verified: Yes activities of daily living that may affect Secondary Verification Process Completed: Yes risk of falls: Patient Requires Transmission-Based Precautions: No Signs or symptoms of abuse/neglect since last visito No Patient Has Alerts: Yes Hospitalized since last visit: No Patient Alerts: NOT Diabetic Implantable device outside of the clinic excluding No cellular tissue based products placed in the center since last visit: Has Dressing in Place as Prescribed: Yes Pain Present Now: No Electronic Signature(s) Signed: 09/17/2022 5:11:01 PM By: Massie Kluver Entered By: Massie Kluver on 09/17/2022 13:43:20 -------------------------------------------------------------------------------- Clinic Level of Care Assessment Details Patient Name: Date of Service: [name]. 09/17/2022 1:30 PM Medical Record Number: [id number] Patient Account Number: [id number] Date of Birth/Sex: Treating RN: May 08, 1956 (66 y.o. [name] Primary Care Ellenor Wisniewski: Jon Billings Other Clinician: Massie Kluver Referring Ashlynne Shetterly: Treating Rhylen Pulido/Extender:  Venia Minks in Treatment: 3 Clinic Level of Care Assessment Items TOOL 1 Quantity Score '[]'$  - 0 Use when EandM and Procedure is performed on INITIAL visit ASSESSMENTS - Nursing Assessment / Reassessment '[]'$  - 0 General Physical Exam (combine w/ comprehensive assessment (listed just below) when performed on new pt. evals) '[]'$  - 0 Comprehensive Assessment (HX, ROS, Risk Assessments, Wounds Hx, etc.) [name], [name] ([id number]) [id number] Page 2 of 11 ASSESSMENTS - Wound and Skin Assessment / Reassessment '[]'$  - 0 Dermatologic / Skin Assessment (not related to wound area) ASSESSMENTS - Ostomy and/or Continence Assessment and Care '[]'$  - 0 Incontinence Assessment and Management '[]'$  - 0 Ostomy Care Assessment and Management (repouching, etc.) PROCESS - Coordination of Care '[]'$  - 0 Simple Patient / Family Education for ongoing care '[]'$  - 0 Complex (extensive) Patient / Family Education for ongoing care '[]'$  - 0 Staff obtains Programmer, systems, Records, T Results / Process Orders est '[]'$  - 0 Staff telephones HHA, Nursing Homes / Clarify orders / etc '[]'$  - 0 Routine Transfer to another Facility (non-emergent condition) '[]'$  - 0 Routine Hospital Admission (non-emergent condition) '[]'$  - 0 New Admissions / Biomedical engineer / Ordering NPWT Apligraf, etc. , '[]'$  - 0 Emergency Hospital Admission (emergent condition) PROCESS - Special Needs '[]'$  - 0 Pediatric / Minor Patient Management '[]'$  - 0 Isolation Patient Management '[]'$  - 0 Hearing / Language / Visual special needs '[]'$  - 0 Assessment of Community assistance (transportation, D/C planning, etc.) '[]'$  - 0 Additional assistance / Altered mentation '[]'$  - 0 Support Surface(s) Assessment (bed, cushion, seat, etc.) INTERVENTIONS - Miscellaneous '[]'$  - 0 External ear exam '[]'$  - 0 Patient Transfer (multiple staff / Civil Service fast streamer / Similar devices) '[]'$  - 0 Simple Staple / Suture removal (25 or less) '[]'$  -  0 Complex Staple / Suture removal (26 or more) '[]'$  - 0 Hypo/Hyperglycemic Management (do not check if billed separately) '[]'$  - 0 Ankle / Brachial Index (ABI) - do not check if  billed separately Has the patient been seen at the hospital within the last three years: Yes Total Score: 0 Level Of Care: ____ Electronic Signature(s) Signed: 09/17/2022 5:11:01 PM By: Massie Kluver Entered By: Massie Kluver on 09/17/2022 14:35:12 -------------------------------------------------------------------------------- Encounter Discharge Information Details Patient Name: Date of Service: [name]. 09/17/2022 1:30 PM Medical Record Number: [id number] Patient Account Number: [id number] Date of Birth/Sex: Treating RN: [birth year] (66 y.o. [name] Primary Care Eathan Groman: Jon Billings Other Clinician: Massie Kluver Referring Kharon Hixon: Treating Naomii Kreger/Extender: Leonides Grills [place], Irene ([id number]) [phone].pdf Page 3 of 11 Weeks in Treatment: 3 Encounter Discharge Information Items Post Procedure Vitals Discharge Condition: Stable Temperature (F): 98.4 Ambulatory Status: Ambulatory Pulse (bpm): 96 Discharge Destination: Home Respiratory Rate (breaths/min): 16 Transportation: Other Blood Pressure (mmHg): 124/71 Accompanied By: self Schedule Follow-up Appointment: Yes Clinical Summary of Care: Electronic Signature(s) Signed: 09/17/2022 5:11:01 PM By: Massie Kluver Entered By: Massie Kluver on 09/17/2022 17:10:29 -------------------------------------------------------------------------------- Lower Extremity Assessment Details Patient Name: Date of Service: [name]. 09/17/2022 1:30 PM Medical Record Number: [id number] Patient Account Number: [id number] Date of Birth/Sex: Treating RN: [birth year] (66 y.o. [name] Primary Care Gilman Olazabal: Jon Billings Other Clinician: Massie Kluver Referring  Talibah Colasurdo: Treating Lorel Lembo/Extender: Venia Minks in Treatment: 3 Electronic Signature(s) Signed: 09/17/2022 5:11:01 PM By: Massie Kluver Signed: 09/18/2022 4:00:23 PM By: Carlene Coria RN Entered By: Massie Kluver on 09/17/2022 14:02:09 -------------------------------------------------------------------------------- Multi Wound Chart Details Patient Name: Date of Service: [name]. 09/17/2022 1:30 PM Medical Record Number: [id number] Patient Account Number: [id number] Date of Birth/Sex: Treating RN: 07-24-1956 (66 y.o. [name] Primary Care Tenzin Pavon: Jon Billings Other Clinician: Massie Kluver Referring Graycen Sadlon: Treating Haillee Johann/Extender: Venia Minks in Treatment: 3 Vital Signs Height(in): Pulse(bpm): 96 Weight(lbs): Blood Pressure(mmHg): 124/71 Body Mass Index(BMI): Temperature(F): 98.4 Respiratory Rate(breaths/min): 16 [[name], [name] ([id number]):Photos:] [phone].pdf Page 4 of 11:1 2] Left, Proximal Upper Leg Left, Distal Upper Leg Right Knee Wound Location: Trauma Trauma Trauma Wounding Event: Trauma, Other Trauma, Other Trauma, Other Primary Etiology: Anemia, Hypertension, Lupus Anemia, Hypertension, Lupus Anemia, Hypertension, Lupus Comorbid History: Erythematosus, Osteoarthritis, Erythematosus, Osteoarthritis, Erythematosus, Osteoarthritis, Received Radiation Received Radiation Received Radiation 08/11/2022 08/11/2022 08/11/2022 Date Acquired: '3 3 3 '$ Weeks of Treatment: Open Open Open Wound Status: No No No Wound Recurrence: Yes No No Clustered Wound: 0.1x0.1x0.1 7.8x9.7x5 3x5.5x0.3 Measurements L x W x D (cm) 0.008 59.423 12.959 A (cm) : rea 0.001 297.116 3.888 Volume (cm) : 99.90% 40.00% -10.00% % Reduction in A rea: 100.00% 40.00% 80.60% % Reduction in Volume: 11 Position 1 (o'clock): 0.9 Maximum Distance 1 (cm): 12 Position 2  (o'clock): 3.4 Maximum Distance 2 (cm): N/A Yes No Tunneling: Full Thickness Without Exposed Full Thickness With Exposed Support Full Thickness Without Exposed Classification: Support Structures Structures Support Structures None Present Medium Large Exudate A mount: N/A Serosanguineous Serosanguineous Exudate Type: N/A red, brown red, brown Exudate Color: None Present (0%) N/A N/A Granulation A mount: None Present (0%) N/A N/A Necrotic A mount: Large (67-100%) N/A N/A Epithelialization: Treatment Notes Electronic Signature(s) Signed: 09/17/2022 5:11:01 PM By: Massie Kluver Entered By: Massie Kluver on 09/17/2022 14:02:23 -------------------------------------------------------------------------------- Multi-Disciplinary Care Plan Details Patient Name: Date of Service: [name]. 09/17/2022 1:30 PM Medical Record Number: [id number] Patient Account Number: [id number] Date of Birth/Sex: Treating RN: Jul 21, 1956 (66 y.o. [name] Primary Care Yoland Scherr: Jon Billings Other Clinician: Massie Kluver Referring Madora Barletta: Treating Crystallynn Noorani/Extender: Venia Minks  in Treatment: 3 Active Inactive Abuse / Safety / Falls / Self Care Management Nursing Diagnoses: History [address] [place], [place] [name] ([id number]) [id number] Page 5 of 11 Goals: Patient will remain injury free related to falls Date Initiated: 08/24/2022 Target Resolution Date: 08/24/2022 Goal Status: Active Patient/caregiver will verbalize understanding of skin care regimen Date Initiated: 08/24/2022 Target Resolution Date: 08/24/2022 Goal Status: Active Interventions: Assess fall risk on admission and as needed Notes: Orientation to the Wound Care Program Nursing Diagnoses: Knowledge deficit related to the wound healing center program Goals: Patient/caregiver will verbalize understanding of the [place] Date Initiated:  08/24/2022 Target Resolution Date: 08/24/2022 Goal Status: Active Interventions: Provide education on orientation to the wound center Notes: Wound/Skin Impairment Nursing Diagnoses: Impaired tissue integrity Knowledge deficit related to ulceration/compromised skin integrity Goals: Patient/caregiver will verbalize understanding of skin care regimen Date Initiated: 08/24/2022 Target Resolution Date: 08/24/2022 Goal Status: Active Ulcer/skin breakdown will have a volume reduction of 30% by week 4 Date Initiated: 08/24/2022 Target Resolution Date: 09/21/2022 Goal Status: Active Interventions: Assess patient/caregiver ability to obtain necessary supplies Assess ulceration(s) every visit Treatment Activities: Referred to DME Kordell Jafri for dressing supplies : 08/24/2022 Skin care regimen initiated : 08/24/2022 Topical wound management initiated : 08/24/2022 Notes: Electronic Signature(s) Signed: 09/17/2022 5:11:01 PM By: Massie Kluver Signed: 09/18/2022 4:00:23 PM By: Carlene Coria RN Entered By: Massie Kluver on 09/17/2022 14:02:14 -------------------------------------------------------------------------------- Pain Assessment Details Patient Name: Date of Service: [name]. 09/17/2022 1:30 PM Medical Record Number: [id number] Patient Account Number: [id number] Date of Birth/Sex: Treating RN: 05/18/1956 (66 y.o. [name] [place], [place] [name] ([id number]) [phone].pdf Page 6 of 11 Primary Care Delonna Ney: Jon Billings Other Clinician: Massie Kluver Referring Channa Hazelett: Treating Oluwadamilare Tobler/Extender: Venia Minks in Treatment: 3 Active Problems Location of Pain Severity and Description of Pain Patient Has Paino No Site Locations Pain Management and Medication Current Pain Management: Electronic Signature(s) Signed: 09/17/2022 5:11:01 PM By: Massie Kluver Signed: 09/18/2022 4:00:23 PM By: Carlene Coria RN Entered By:  Massie Kluver on 09/17/2022 13:47:27 -------------------------------------------------------------------------------- Patient/Caregiver Education Details Patient Name: Date of Service: [name]. 11/27/2023andnbsp1:30 PM Medical Record Number: [id number] Patient Account Number: [id number] Date of Birth/Gender: Treating RN: [birth year] (66 y.o. [name] Primary Care Physician: Jon Billings Other Clinician: Massie Kluver Referring Physician: Treating Physician/Extender: Venia Minks in Treatment: 3 Education Assessment Education Provided To: Patient Education Topics Provided Wound/Skin Impairment: Handouts: Other: continue wound care as directed Methods: Explain/Verbal Responses: State content correctly Electronic Signature(s) [name], [name] ([id number]) [id number] Page 7 of 11 Signed: 09/17/2022 5:11:01 PM By: Massie Kluver Entered By: Massie Kluver on 09/17/2022 17:05:16 -------------------------------------------------------------------------------- Wound Assessment Details Patient Name: Date of Service: [name]. 09/17/2022 1:30 PM Medical Record Number: [id number] Patient Account Number: [id number] Date of Birth/Sex: Treating RN: Feb 21, 1956 (66 y.o. [name] Primary Care Neidra Girvan: Jon Billings Other Clinician: Massie Kluver Referring Abryanna Musolino: Treating Refael Fulop/Extender: Venia Minks in Treatment: 3 Wound Status Wound Number: 1 Primary Trauma, Other Etiology: Wound Location: Left, Proximal Upper Leg Wound Healed - Epithelialized Wounding Event: Trauma Status: Date Acquired: 08/11/2022 Comorbid Anemia, Hypertension, Lupus Erythematosus, Osteoarthritis, Weeks Of Treatment: 3 History: Received Radiation Clustered Wound: Yes Photos Wound Measurements Length: (cm) Width: (cm) Depth: (cm) Area: (cm) Volume: (cm) 0 % Reduction in Area:  100% 0 % Reduction in Volume: 100% 0 Epithelialization: Large (67-100%) 0 0 Wound Description Classification: Full Thickness Without Exposed Support Exudate Amount: None  Present Structures Wound Bed Granulation Amount: None Present (0%) Necrotic Amount: None Present (0%) Treatment Notes Wound #1 (Upper Leg) Wound Laterality: Left, Proximal Cleanser Peri-Wound Care Topical [address] [name], [name] ([id number]) [id number] Page 8 of 11 Secondary Dressing Secured With Compression Wrap Compression Stockings Add-Ons Electronic Signature(s) Signed: 09/17/2022 5:11:01 PM By: Massie Kluver Signed: 09/18/2022 4:00:23 PM By: Carlene Coria RN Entered By: Massie Kluver on 09/17/2022 14:28:22 -------------------------------------------------------------------------------- Wound Assessment Details Patient Name: Date of Service: [name]. 09/17/2022 1:30 PM Medical Record Number: [id number] Patient Account Number: [id number] Date of Birth/Sex: Treating RN: 1956-07-08 (66 y.o. [name] Primary Care Heber Hoog: Jon Billings Other Clinician: Massie Kluver Referring Margarett Viti: Treating [place] Haydon/Extender: Venia Minks in Treatment: 3 Wound Status Wound Number: 2 Primary Trauma, Other Etiology: Wound Location: Left, Distal Upper Leg Wound Open Wounding Event: Trauma Status: Date Acquired: 08/11/2022 Comorbid Anemia, Hypertension, Lupus Erythematosus, Osteoarthritis, Weeks Of Treatment: 3 History: Received Radiation Clustered Wound: No Photos Wound Measurements Length: (cm) 7.8 Width: (cm) 9.7 Depth: (cm) 5 Area: (cm) 59.423 Volume: (cm) 297.116 % Reduction in Area: 40% % Reduction in Volume: 40% Tunneling: Yes Location 1 Position (o'clock): 11 Maximum Distance: (cm) 0.9 Location 2 Position (o'clock): 12 Maximum Distance: (cm) 3.4 Undermining: No Wound Description Classification: Full  Thickness With Exposed Support Structures Exudate Amount: KYMBERLEE, [name] ([id number]) Exudate Type: Serosanguineous Exudate Color: red, brown Foul Odor After Cleansing: No [id number] Page 9 of 11 Wound Bed Exposed Structure Fat Layer (Subcutaneous Tissue) Exposed: Yes Treatment Notes Wound #2 (Upper Leg) Wound Laterality: Left, Distal Cleanser Peri-Wound Care Skin Prep Discharge Instruction: Use skin prep as directed Topical Gentamicin Discharge Instruction: Apply as directed by Shamara Soza. Primary Dressing Gauze Discharge Instruction: moistened with Dakins Solution Prisma 4.34 (in) Discharge Instruction: pack dry prisma into tunnel Secondary Dressing ABD Pad 5x9 (in/in) Discharge Instruction: Cover with ABD pad Secured With [facility] T ape ape, 2x2 (in/yd) Compression Wrap Compression Stockings Add-Ons Electronic Signature(s) Signed: 09/17/2022 5:11:01 PM By: Massie Kluver Signed: 09/18/2022 4:00:23 PM By: Carlene Coria RN Entered By: Massie Kluver on 09/17/2022 14:01:11 -------------------------------------------------------------------------------- Wound Assessment Details Patient Name: Date of Service: [name]. 09/17/2022 1:30 PM Medical Record Number: [id number] Patient Account Number: [id number] Date of Birth/Sex: Treating RN: 1955-10-24 (66 y.o. [name] Primary Care Sundiata Ferrick: Jon Billings Other Clinician: Massie Kluver Referring Shaheer Bonfield: Treating Kaitlynne Wenz/Extender: Venia Minks in Treatment: 3 Wound Status Wound Number: 3 Primary Trauma, Other Etiology: Wound Location: Right Knee Wound Open Wounding Event: Trauma Status: Date Acquired: 08/11/2022 Comorbid Anemia, Hypertension, Lupus Erythematosus, Osteoarthritis, Weeks Of Treatment: 3 History: Received Radiation Clustered Wound: No [name], [name] ([id number])  [id number] Page 10 of 11 Photos Wound Measurements Length: (cm) 3 Width: (cm) 5.5 Depth: (cm) 0.3 Area: (cm) 12.959 Volume: (cm) 3.888 % Reduction in Area: -10% % Reduction in Volume: 80.6% Tunneling: No Undermining: No Wound Description Classification: Full Thickness Without Exposed Support Exudate Amount: Large Exudate Type: Serosanguineous Exudate Color: red, brown Structures Wound Bed Exposed Structure Fat Layer (Subcutaneous Tissue) Exposed: Yes Treatment Notes Wound #3 (Knee) Wound Laterality: Right Cleanser Peri-Wound Care Skin Prep Discharge Instruction: Use skin prep as directed Topical Gentamicin Discharge Instruction: Apply as directed by Deonta Bomberger. Primary Dressing Hydrofera Blue Ready Transfer Foam, 4x5 (in/in) Discharge Instruction: Apply Hydrofera Blue Ready to wound bed as directed Secondary Dressing (BORDER) Zetuvit Plus SILICONE BORDER Dressing 5x5 (in/in) Discharge Instruction: Please do not  put silicone bordered dressings under wraps. Use non-bordered dressing only. Secured With Compression Wrap Compression Stockings Add-Ons Electronic Signature(s) Signed: 09/17/2022 5:11:01 PM By: Massie Kluver Signed: 09/18/2022 4:00:23 PM By: Carlene Coria RN Entered By: Massie Kluver on 09/17/2022 14:01:48 [name] ([id number]) [id number].pdf Page 11 of 11 -------------------------------------------------------------------------------- Vitals Details Patient Name: Date of Service: [name]. 09/17/2022 1:30 PM Medical Record Number: [id number] Patient Account Number: [id number] Date of Birth/Sex: Treating RN: 01/27/1956 (66 y.o. [name] Primary Care Amaurie Schreckengost: Jon Billings Other Clinician: Massie Kluver Referring Norelle Runnion: Treating Janis Cuffe/Extender: Venia Minks in Treatment: 3 Vital Signs Time Taken: 13:43 Temperature (F): 98.4 Pulse  (bpm): 96 Respiratory Rate (breaths/min): 16 Blood Pressure (mmHg): 124/71 Reference Range: 80 - 120 mg / dl Electronic Signature(s) Signed: 09/17/2022 5:11:01 PM By: Massie Kluver Entered By: Massie Kluver on 09/17/2022 13:47:23

## 2022-09-19 DIAGNOSIS — L97812 Non-pressure chronic ulcer of other part of right lower leg with fat layer exposed: Secondary | ICD-10-CM | POA: Diagnosis not present

## 2022-09-19 DIAGNOSIS — D513 Other dietary vitamin B12 deficiency anemia: Secondary | ICD-10-CM | POA: Diagnosis not present

## 2022-09-19 DIAGNOSIS — L98493 Non-pressure chronic ulcer of skin of other sites with necrosis of muscle: Secondary | ICD-10-CM | POA: Diagnosis not present

## 2022-09-19 DIAGNOSIS — M328 Other forms of systemic lupus erythematosus: Secondary | ICD-10-CM | POA: Diagnosis not present

## 2022-09-19 NOTE — Telephone Encounter (Signed)
Called to check on status and they state they have all the information and the application is under review at this time

## 2022-09-20 NOTE — Progress Notes (Signed)
[name], [name] ([id number]) [id number] Page 1 of 11 Visit Report for 09/11/2022 Arrival Information Details Patient Name: Date of Service: [name]. 09/11/2022 8:00 A M Medical Record Number: [id number] Patient Account Number: [id number] Date of Birth/Sex: Treating RN: Jul 14, 1956 (66 y.o. [name] Primary Care Jama Mcmiller: Jon Billings Other Clinician: Referring Sharell Hilmer: Treating Tracey Stewart/Extender: Venia Minks in Treatment: 2 Visit Information History Since Last Visit All ordered tests and consults were completed: No Patient Arrived: Ambulatory Added or deleted any medications: No Arrival Time: 08:01 Any new allergies or adverse reactions: No Accompanied By: self Had a fall or experienced change in No Transfer Assistance: None activities of daily living that may affect Patient Identification Verified: Yes risk of falls: Secondary Verification Process Completed: Yes Signs or symptoms of abuse/neglect since last visito No Patient Requires Transmission-Based Precautions: No Hospitalized since last visit: No Patient Has Alerts: Yes Implantable device outside of the clinic excluding No Patient Alerts: NOT Diabetic cellular tissue based products placed in the center since last visit: Has Dressing in Place as Prescribed: Yes Pain Present Now: No Electronic Signature(s) Signed: 09/20/2022 4:41:36 PM By: Carlene Coria RN Entered By: Carlene Coria on 09/11/2022 08:08:03 -------------------------------------------------------------------------------- Clinic Level of Care Assessment Details Patient Name: Date of Service: [name]. 09/11/2022 8:00 A M Medical Record Number: [id number] Patient Account Number: [id number] Date of Birth/Sex: Treating RN: 27-Dec-1955 (66 y.o. [name] Primary Care Ezrah Panning: Jon Billings Other Clinician: Referring Ersilia Brawley: Treating Mable Dara/Extender: Venia Minks in Treatment: 2 Clinic Level of Care Assessment Items TOOL 4 Quantity Score X- 1 0 Use when only an EandM is performed on FOLLOW-UP visit ASSESSMENTS - Nursing Assessment / Reassessment X- 1 10 Reassessment of Co-morbidities (includes updates in patient status) X- 1 5 Reassessment of Adherence to Treatment Plan KESHARA, KIGER [name] ([id number]) [id number] Page 2 of 11 ASSESSMENTS - Wound and Skin A ssessment / Reassessment '[]'$  - 0 Simple Wound Assessment / Reassessment - one wound X- 3 5 Complex Wound Assessment / Reassessment - multiple wounds '[]'$  - 0 Dermatologic / Skin Assessment (not related to wound area) ASSESSMENTS - Focused Assessment '[]'$  - 0 Circumferential Edema Measurements - multi extremities '[]'$  - 0 Nutritional Assessment / Counseling / Intervention '[]'$  - 0 Lower Extremity Assessment (monofilament, tuning fork, pulses) '[]'$  - 0 Peripheral Arterial Disease Assessment (using hand held doppler) ASSESSMENTS - Ostomy and/or Continence Assessment and Care '[]'$  - 0 Incontinence Assessment and Management '[]'$  - 0 Ostomy Care Assessment and Management (repouching, etc.) PROCESS - Coordination of Care X - Simple Patient / Family Education for ongoing care 1 15 '[]'$  - 0 Complex (extensive) Patient / Family Education for ongoing care '[]'$  - 0 Staff obtains Programmer, systems, Records, T Results / Process Orders est '[]'$  - 0 Staff telephones HHA, Nursing Homes / Clarify orders / etc '[]'$  - 0 Routine Transfer to another Facility (non-emergent condition) '[]'$  - 0 Routine Hospital Admission (non-emergent condition) '[]'$  - 0 New Admissions / Biomedical engineer / Ordering NPWT Apligraf, etc. , '[]'$  - 0 Emergency Hospital Admission (emergent condition) X- 1 10 Simple Discharge Coordination '[]'$  - 0 Complex (extensive) Discharge Coordination PROCESS - Special Needs '[]'$  - 0 Pediatric / Minor Patient Management '[]'$  - 0 Isolation Patient  Management '[]'$  - 0 Hearing / Language / Visual special needs '[]'$  - 0 Assessment of Community assistance (transportation, D/C planning, etc.) '[]'$  - 0 Additional assistance / Altered mentation '[]'$  - 0 Support Surface(s) Assessment (bed,  cushion, seat, etc.) INTERVENTIONS - Wound Cleansing / Measurement '[]'$  - 0 Simple Wound Cleansing - one wound X- 3 5 Complex Wound Cleansing - multiple wounds X- 1 5 Wound Imaging (photographs - any number of wounds) '[]'$  - 0 Wound Tracing (instead of photographs) '[]'$  - 0 Simple Wound Measurement - one wound X- 3 5 Complex Wound Measurement - multiple wounds INTERVENTIONS - Wound Dressings X - Small Wound Dressing one or multiple wounds 2 10 X- 1 15 Medium Wound Dressing one or multiple wounds '[]'$  - 0 Large Wound Dressing one or multiple wounds X- 1 5 Application of Medications - topical '[]'$  - 0 Application of Medications - injection INTERVENTIONS - Miscellaneous '[]'$  - 0 External ear exam [name], [name] ([id number]) [id number] Page 3 of 11 '[]'$  - 0 Specimen Collection (cultures, biopsies, blood, body fluids, etc.) '[]'$  - 0 Specimen(s) / Culture(s) sent or taken to Lab for analysis '[]'$  - 0 Patient Transfer (multiple staff / Harrel Lemon Lift / Similar devices) '[]'$  - 0 Simple Staple / Suture removal (25 or less) '[]'$  - 0 Complex Staple / Suture removal (26 or more) '[]'$  - 0 Hypo / Hyperglycemic Management (close monitor of Blood Glucose) '[]'$  - 0 Ankle / Brachial Index (ABI) - do not check if billed separately X- 1 5 Vital Signs Has the patient been seen at the hospital within the last three years: Yes Total Score: 135 Level Of Care: New/Established - Level 4 Electronic Signature(s) Signed: 09/20/2022 4:41:36 PM By: Carlene Coria RN Entered By: Carlene Coria on 09/11/2022 08:38:44 -------------------------------------------------------------------------------- Encounter Discharge Information Details Patient Name: Date of  Service: [name]. 09/11/2022 8:00 A M Medical Record Number: [id number] Patient Account Number: [id number] Date of Birth/Sex: Treating RN: 11/27/1955 (66 y.o. [name] Primary Care Eliannah Hinde: Jon Billings Other Clinician: Referring Shamus Desantis: Treating Basil Blakesley/Extender: Venia Minks in Treatment: 2 Encounter Discharge Information Items Discharge Condition: Stable Ambulatory Status: Ambulatory Discharge Destination: Home Transportation: Private Auto Accompanied By: self Schedule Follow-up Appointment: Yes Clinical Summary of Care: Electronic Signature(s) Signed: 09/11/2022 8:41:30 AM By: Carlene Coria RN Entered By: Carlene Coria on 09/11/2022 08:41:30 Lower Extremity Assessment Details -------------------------------------------------------------------------------- [name] ([id number]) [id number] Page 4 of 11 Patient Name: Date of Service: [name]. 09/11/2022 8:00 A M Medical Record Number: [id number] Patient Account Number: [id number] Date of Birth/Sex: Treating RN: 1955/11/26 (66 y.o. [name] Primary Care Reyden Smith: Jon Billings Other Clinician: Referring Anibal Quinby: Treating Kersti Scavone/Extender: Venia Minks in Treatment: 2 Electronic Signature(s) Signed: 09/20/2022 4:41:36 PM By: Carlene Coria RN Entered By: Carlene Coria on 09/11/2022 08:16:32 -------------------------------------------------------------------------------- Multi Wound Chart Details Patient Name: Date of Service: [name]. 09/11/2022 8:00 A M Medical Record Number: [id number] Patient Account Number: [id number] Date of Birth/Sex: Treating RN: Apr 02, 1956 (66 y.o. [name] Primary Care Tinslee Klare: Jon Billings Other Clinician: Referring Eleana Tocco: Treating Trista Ciocca/Extender: Venia Minks in Treatment: 2 Vital  Signs Height(in): Pulse(bpm): 75 Weight(lbs): Blood Pressure(mmHg): 124/76 Body Mass Index(BMI): Temperature(F): 97.9 Respiratory Rate(breaths/min): 18 [1:Photos:] Left, Proximal Upper Leg Left, Distal Upper Leg Right Knee Wound Location: Trauma Trauma Trauma Wounding Event: Trauma, Other Trauma, Other Trauma, Other Primary Etiology: Anemia, Hypertension, Lupus Anemia, Hypertension, Lupus Anemia, Hypertension, Lupus Comorbid History: Erythematosus, Osteoarthritis, Erythematosus, Osteoarthritis, Erythematosus, Osteoarthritis, Received Radiation Received Radiation Received Radiation 08/11/2022 08/11/2022 08/11/2022 Date Acquired: '2 2 2 '$ Weeks of Treatment: Open Open Open Wound Status: No No No Wound Recurrence: Yes No No Clustered Wound:  0.1x0.1x0.1 7x11x1.5 3.8x6x0.9 Measurements L x W x D (cm) 0.008 60.476 17.907 A (cm) : rea 0.001 90.713 16.116 Volume (cm) : 99.90% 38.90% -52.00% % Reduction in A rea: 100.00% 81.70% 19.50% % Reduction in Volume: 12 Position 1 (o'clock): 6 Maximum Distance 1 (cm): No Yes No Tunneling: Full Thickness Without Exposed Full Thickness With Exposed Support Full Thickness Without Exposed Classification: Support Structures Structures Support Structures None Present Medium Large Exudate Amount: N/A Serosanguineous Serosanguineous Exudate Type: N/A red, brown red, brown Exudate Color: Large (67-100%) Large (67-100%) Large (67-100%) Granulation AmountCHARLINE, [name] ([id number]) [id number] Page 5 of 11 Pink Pink N/A Granulation Quality: None Present (0%) Small (1-33%) Small (1-33%) Necrotic Amount: N/A Adherent Slough Eschar, Adherent Slough Necrotic Tissue: Fat Layer (Subcutaneous Tissue): Yes Fat Layer (Subcutaneous Tissue): Yes Fat Layer (Subcutaneous Tissue): Yes Exposed Structures: Fascia: No Fascia: No Fascia: No Tendon: No Tendon: No Tendon: No Muscle: No Muscle: No Muscle:  No Joint: No Joint: No Joint: No Bone: No Bone: No Bone: No Large (67-100%) None None Epithelialization: Treatment Notes Electronic Signature(s) Signed: 09/20/2022 4:41:36 PM By: Carlene Coria RN Entered By: Carlene Coria on 09/11/2022 08:16:47 -------------------------------------------------------------------------------- Multi-Disciplinary Care Plan Details Patient Name: Date of Service: [name]. 09/11/2022 8:00 A M Medical Record Number: [id number] Patient Account Number: [id number] Date of Birth/Sex: Treating RN: 1955-11-21 (66 y.o. [name] Primary Care Messiah Ahr: Jon Billings Other Clinician: Referring Lilla Callejo: Treating Nema Oatley/Extender: Venia Minks in Treatment: 2 Active Inactive Abuse / Safety / Falls / Self Care Management Nursing Diagnoses: History of Falls Goals: Patient will remain injury free related to falls Date Initiated: 08/24/2022 Target Resolution Date: 08/24/2022 Goal Status: Active Patient/caregiver will verbalize understanding of skin care regimen Date Initiated: 08/24/2022 Target Resolution Date: 08/24/2022 Goal Status: Active Interventions: Assess fall risk on admission and as needed Notes: Orientation to the Wound Care Program Nursing Diagnoses: Knowledge deficit related to the wound healing center program Goals: Patient/caregiver will verbalize understanding of the [place] Date Initiated: 08/24/2022 Target Resolution Date: 08/24/2022 Goal Status: Active Interventions: Provide education on orientation to the wound center Notes: [name], [name] ([id number]) [id number] Page 6 of 11 Wound/Skin Impairment Nursing Diagnoses: Impaired tissue integrity Knowledge deficit related to ulceration/compromised skin integrity Goals: Patient/caregiver will verbalize understanding of skin care regimen Date Initiated: 08/24/2022 Target Resolution Date:  08/24/2022 Goal Status: Active Ulcer/skin breakdown will have a volume reduction of 30% by week 4 Date Initiated: 08/24/2022 Target Resolution Date: 09/21/2022 Goal Status: Active Interventions: Assess patient/caregiver ability to obtain necessary supplies Assess ulceration(s) every visit Treatment Activities: Referred to DME Pattye Meda for dressing supplies : 08/24/2022 Skin care regimen initiated : 08/24/2022 Topical wound management initiated : 08/24/2022 Notes: Electronic Signature(s) Signed: 09/20/2022 4:41:36 PM By: Carlene Coria RN Entered By: Carlene Coria on 09/11/2022 08:16:39 -------------------------------------------------------------------------------- Pain Assessment Details Patient Name: Date of Service: [name]. 09/11/2022 8:00 A M Medical Record Number: [id number] Patient Account Number: [id number] Date of Birth/Sex: Treating RN: Dec 29, 1955 (66 y.o. [name] Primary Care Raeden Belzer: Jon Billings Other Clinician: Referring Lamari Beckles: Treating Tesia Lybrand/Extender: Venia Minks in Treatment: 2 Active Problems Location of Pain Severity and Description of Pain Patient Has Paino No Site Locations Pain Management and Medication [name], [name] ([id number]) [id number] Page 7 of 11 Current Pain Management: Electronic Signature(s) Signed: 09/20/2022 4:41:36 PM By: Carlene Coria RN Entered By: Carlene Coria on 09/11/2022 08:08:43 -------------------------------------------------------------------------------- Patient/Caregiver Education Details Patient Name: Date of Service: [facility]  Ashby Dawes [name]. 11/21/2023andnbsp8:00 A M Medical Record Number: [id number] Patient Account Number: [id number] Date of Birth/Gender: Treating RN: 1956-08-15 (66 y.o. [name] Primary Care Physician: Jon Billings Other Clinician: Referring Physician: Treating Physician/Extender: Venia Minks  in Treatment: 2 Education Assessment Education Provided To: Patient Education Topics Provided Gibson: o Methods: Explain/Verbal Responses: State content correctly Wound/Skin Impairment: Methods: Explain/Verbal Responses: State content correctly Electronic Signature(s) Signed: 09/20/2022 4:41:36 PM By: Carlene Coria RN Entered By: Carlene Coria on 09/11/2022 08:40:50 -------------------------------------------------------------------------------- Wound Assessment Details Patient Name: Date of Service: [name]. 09/11/2022 8:00 A M Medical Record Number: [id number] Patient Account Number: [id number] Date of Birth/Sex: Treating RN: 1956-06-14 (66 y.o. [name] Primary Care Japhet Morgenthaler: Jon Billings Other Clinician: Referring Juniel Groene: Treating Nivaan Dicenzo/Extender: Venia Minks in Treatment: 2 Wound Status [name], [name] ([id number]) [phone].pdf Page 8 of 11 Wound Number: 1 Primary Trauma, Other Etiology: Wound Location: Left, Proximal Upper Leg Wound Open Wounding Event: Trauma Status: Date Acquired: 08/11/2022 Comorbid Anemia, Hypertension, Lupus Erythematosus, Osteoarthritis, Weeks Of Treatment: 2 History: Received Radiation Clustered Wound: Yes Photos Wound Measurements Length: (cm) 0.1 Width: (cm) 0.1 Depth: (cm) 0.1 Area: (cm) 0.008 Volume: (cm) 0.001 % Reduction in Area: 99.9% % Reduction in Volume: 100% Epithelialization: Large (67-100%) Tunneling: No Undermining: No Wound Description Classification: Full Thickness Without Exposed Suppor Exudate Amount: None Present t Structures Foul Odor After Cleansing: No Slough/Fibrino No Wound Bed Granulation Amount: Large (67-100%) Exposed Structure Granulation Quality: Pink Fascia Exposed: No Necrotic Amount: None Present (0%) Fat Layer (Subcutaneous Tissue) Exposed: Yes Tendon Exposed: No Muscle Exposed: No Joint  Exposed: No Bone Exposed: No Electronic Signature(s) Signed: 09/20/2022 4:41:36 PM By: Carlene Coria RN Entered By: Carlene Coria on 09/11/2022 08:15:02 -------------------------------------------------------------------------------- Wound Assessment Details Patient Name: Date of Service: [name]. 09/11/2022 8:00 A M Medical Record Number: [id number] Patient Account Number: [id number] Date of Birth/Sex: Treating RN: December 09, 1955 (66 y.o. [name] Primary Care Tatianna Ibbotson: Jon Billings Other Clinician: Referring Goble Fudala: Treating Lodie Waheed/Extender: Venia Minks in Treatment: 2 Wound Status Wound Number: 2 Primary Trauma, Other Etiology: Wound Location: Left, Distal Upper Leg Wound Open Wounding Event: Trauma Status: [name], [name] ([id number]) [id number] Page 9 of 11 Status: Date Acquired: 08/11/2022 Comorbid Anemia, Hypertension, Lupus Erythematosus, Osteoarthritis, Weeks Of Treatment: 2 History: Received Radiation Clustered Wound: No Photos Wound Measurements Length: (cm) 7 Width: (cm) 11 Depth: (cm) 1.5 Area: (cm) 60.476 Volume: (cm) 90.713 % Reduction in Area: 38.9% % Reduction in Volume: 81.7% Epithelialization: None Tunneling: Yes Position (o'clock): 12 Maximum Distance: (cm) 6 Undermining: No Wound Description Classification: Full Thickness With Exposed Suppo Exudate Amount: Medium Exudate Type: Serosanguineous Exudate Color: red, brown rt Structures Foul Odor After Cleansing: No Slough/Fibrino Yes Wound Bed Granulation Amount: Large (67-100%) Exposed Structure Granulation Quality: Pink Fascia Exposed: No Necrotic Amount: Small (1-33%) Fat Layer (Subcutaneous Tissue) Exposed: Yes Necrotic Quality: Adherent Slough Tendon Exposed: No Muscle Exposed: No Joint Exposed: No Bone Exposed: No Electronic Signature(s) Signed: 09/20/2022 4:41:36 PM By: Carlene Coria RN Entered By:  Carlene Coria on 09/11/2022 08:15:50 -------------------------------------------------------------------------------- Wound Assessment Details Patient Name: Date of Service: [name]. 09/11/2022 8:00 A M Medical Record Number: [id number] Patient Account Number: [id number] Date of Birth/Sex: Treating RN: 1955-11-03 (66 y.o. [name] Primary Care Lyn Deemer: Jon Billings Other Clinician: Referring Jerica Creegan: Treating Tahjay Binion/Extender: Venia Minks in Treatment: 2 Wound Status Wound  Number: 3 Primary Trauma, Other Etiology: Wound Location: Right Knee Wound Open [name], [name] ([id number]) [id number] Page 10 of 11 Wound Open Wounding Event: Trauma Status: Date Acquired: 08/11/2022 Comorbid Anemia, Hypertension, Lupus Erythematosus, Osteoarthritis, Weeks Of Treatment: 2 History: Received Radiation Clustered Wound: No Photos Wound Measurements Length: (cm) 3.8 Width: (cm) 6 Depth: (cm) 0.9 Area: (cm) 17.907 Volume: (cm) 16.116 % Reduction in Area: -52% % Reduction in Volume: 19.5% Epithelialization: None Tunneling: No Undermining: No Wound Description Classification: Full Thickness Without Exposed Support Exudate Amount: Large Exudate Type: Serosanguineous Exudate Color: red, brown Structures Foul Odor After Cleansing: No Slough/Fibrino Yes Wound Bed Granulation Amount: Large (67-100%) Exposed Structure Necrotic Amount: Small (1-33%) Fascia Exposed: No Necrotic Quality: Eschar, Adherent Slough Fat Layer (Subcutaneous Tissue) Exposed: Yes Tendon Exposed: No Muscle Exposed: No Joint Exposed: No Bone Exposed: No Electronic Signature(s) Signed: 09/20/2022 4:41:36 PM By: Carlene Coria RN Entered By: Carlene Coria on 09/11/2022 08:16:20 -------------------------------------------------------------------------------- Vitals Details Patient Name: Date of Service: [name]. 09/11/2022  8:00 A M Medical Record Number: [id number] Patient Account Number: [id number] Date of Birth/Sex: Treating RN: 1956-07-18 (66 y.o. [name] Primary Care Brentin Shin: Jon Billings Other Clinician: Referring Reise Hietala: Treating Mark Benecke/Extender: Venia Minks in Treatment: 2 Vital Signs Time Taken: 08:08 Temperature (F): 97.9 Pulse (bpm): 94 Respiratory Rate (breaths/min): 18 Blood Pressure (mmHg): 124/76 ROUX, BRANDY [name] ([id number]) [id number] Page 11 of 11 Reference Range: 80 - 120 mg / dl Electronic Signature(s) Signed: 09/20/2022 4:41:36 PM By: Carlene Coria RN Entered By: Carlene Coria on 09/11/2022 08:08:35

## 2022-09-20 NOTE — Progress Notes (Signed)
KERISSA, COIA [name] ([id number]) [id number] Page 1 of 2 Visit Report for 09/19/2022 Physician Orders Details Patient Name: Date of Service: [name] 09/19/2022 3:45 PM Medical Record Number: [id number] Patient Account Number: [id number] Date of Birth/Sex: Treating RN: 23-Feb-1956 (66 y.o. [name] Primary Care Provider: Jon Billings Other Clinician: Massie Kluver Referring Provider: Treating Provider/Extender: Jomarie Longs in Treatment: 3 Verbal / Phone Orders: No Diagnosis Coding Follow-up Appointments Return Appointment in 1 week. Nurse Visit as needed - daily unless seen by MD Anesthetic (Use 'Patient Medications' Section for Anesthetic Order Entry) Lidocaine applied to wound bed Edema Control - Lymphedema / Segmental Compressive Device / Other Elevate, Exercise Daily and A void Standing for Long Periods of Time. Elevate legs to the level of the heart and pump ankles as often as possible Elevate leg(s) parallel to the floor when sitting. Medications-Please add to medication list. ntibiotic - apply thin layer of Gentamycin to wound Topical A Wound Treatment Wound #2 - Upper Leg Wound Laterality: Left, Distal Peri-Wound Care: Skin Prep 1 x Per Day/30 Days Discharge Instructions: Use skin prep as directed Topical: Gentamicin 1 x Per Day/30 Days Discharge Instructions: Apply as directed by provider. Prim Dressing: Gauze 1 x Per Day/30 Days ary Discharge Instructions: moistened with Dakins Solution Prim Dressing: Prisma 4.34 (in) 1 x Per Day/30 Days ary Discharge Instructions: pack dry prisma into tunnel Secondary Dressing: ABD Pad 5x9 (in/in) 1 x Per Day/30 Days Discharge Instructions: Cover with ABD pad Secured With: Medipore T - 26M Medipore H Soft Cloth Surgical T ape ape, 2x2 (in/yd) 1 x Per Day/30 Days Wound #3 - Knee Wound Laterality: Right Peri-Wound Care: Skin Prep 3 x Per Week/30  Days Discharge Instructions: Use skin prep as directed Topical: Gentamicin 3 x Per Week/30 Days Discharge Instructions: Apply as directed by provider. Prim Dressing: Hydrofera Blue Ready Transfer Foam, 4x5 (in/in) 3 x Per Week/30 Days ary Discharge Instructions: Apply Hydrofera Blue Ready to wound bed as directed Secondary Dressing: (BORDER) Zetuvit Plus SILICONE BORDER Dressing 5x5 (in/in) 3 x Per Week/30 Days Discharge Instructions: Please do not put silicone bordered dressings under wraps. Use non-bordered dressing only. [name], [name] ([id number]) [id number] Page 2 of 2 Electronic Signature(s) Signed: 09/20/2022 9:12:27 AM By: Kalman Shan DO Signed: 09/20/2022 4:33:44 PM By: Massie Kluver Previous Signature: 09/19/2022 4:08:48 PM Version By: Kalman Shan DO Entered By: Massie Kluver on 09/19/2022 16:38:05 -------------------------------------------------------------------------------- SuperBill Details Patient Name: Date of Service: Kristine Mater [name]. 09/19/2022 Medical Record Number: [id number] Patient Account Number: [id number] Date of Birth/Sex: Treating RN: [birth year] (66 y.o. 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

## 2022-09-20 NOTE — Progress Notes (Signed)
TENLEY, WINWARD [name] ([id number]) [id number] Page 1 of 6 Visit Report for 09/19/2022 Arrival Information Details Patient Name: Date of Service: [name] 09/19/2022 3:45 PM Medical Record Number: [id number] Patient Account Number: [id number] Date of Birth/Sex: Treating [name]: April 11, 1956 (66 y.o. [name] Primary Care [name]: Jon Billings Other Clinician: Massie Kluver Referring [name]: Treating [name]/Extender: [name] in Treatment: 3 Visit Information History Since Last Visit All ordered tests and consults were completed: No Patient Arrived: Ambulatory Added or deleted any medications: No Arrival Time: 16:02 Any new allergies or adverse reactions: No Transfer Assistance: None Had a fall or experienced change in No Patient Identification Verified: Yes activities of daily living that may affect Secondary Verification Process Completed: Yes risk of falls: Patient Requires Transmission-Based Precautions: No Signs or symptoms of abuse/neglect since last visito No Patient Has Alerts: Yes Hospitalized since last visit: No Patient Alerts: NOT Diabetic Implantable device outside of the clinic excluding No cellular tissue based products placed in the center since last visit: Has Dressing in Place as Prescribed: Yes Pain Present Now: No Electronic Signature(s) Signed: 09/20/2022 4:33:44 PM By: Massie Kluver Entered By: Massie Kluver on 09/19/2022 16:04:11 -------------------------------------------------------------------------------- Clinic Level of Care Assessment Details Patient Name: Date of Service: [name] 09/19/2022 3:45 PM Medical Record Number: [id number] Patient Account Number: [id number] Date of Birth/Sex: Treating [name]: 25-Jul-1956 (66 y.o. [name] Primary Care Darol Cush: Jon Billings Other Clinician: Massie Kluver Referring [name]: Treating  [name]: [name] in Treatment: 3 Clinic Level of Care Assessment Items TOOL 4 Quantity Score '[]'$  - 0 Use when only an EandM is performed on FOLLOW-UP visit ASSESSMENTS - Nursing Assessment / Reassessment X- 1 10 Reassessment of Co-morbidities (includes updates in patient status) X- 1 5 Reassessment of Adherence to Treatment Plan [name], [name] ([id number]) [id number] Page 2 of 6 ASSESSMENTS - Wound and Skin A ssessment / Reassessment '[]'$  - 0 Simple Wound Assessment / Reassessment - one wound X- 2 5 Complex Wound Assessment / Reassessment - multiple wounds '[]'$  - 0 Dermatologic / Skin Assessment (not related to wound area) ASSESSMENTS - Focused Assessment '[]'$  - 0 Circumferential Edema Measurements - multi extremities '[]'$  - 0 Nutritional Assessment / Counseling / Intervention '[]'$  - 0 Lower Extremity Assessment (monofilament, tuning fork, pulses) '[]'$  - 0 Peripheral Arterial Disease Assessment (using hand held doppler) ASSESSMENTS - Ostomy and/or Continence Assessment and Care '[]'$  - 0 Incontinence Assessment and Management '[]'$  - 0 Ostomy Care Assessment and Management (repouching, etc.) PROCESS - Coordination of Care X - Simple Patient / Family Education for ongoing care 1 15 '[]'$  - 0 Complex (extensive) Patient / Family Education for ongoing care '[]'$  - 0 Staff obtains Programmer, systems, Records, T Results / Process Orders est '[]'$  - 0 Staff telephones HHA, Nursing Homes / Clarify orders / etc '[]'$  - 0 Routine Transfer to another Facility (non-emergent condition) '[]'$  - 0 Routine Hospital Admission (non-emergent condition) '[]'$  - 0 New Admissions / Biomedical engineer / Ordering NPWT Apligraf, etc. , '[]'$  - 0 Emergency Hospital Admission (emergent condition) X- 1 10 Simple Discharge Coordination '[]'$  - 0 Complex (extensive) Discharge Coordination PROCESS - Special Needs '[]'$  - 0 Pediatric / Minor Patient Management '[]'$  -  0 Isolation Patient Management '[]'$  - 0 Hearing / Language / Visual special needs '[]'$  - 0 Assessment of Community assistance (transportation, D/C planning, etc.) '[]'$  - 0 Additional assistance / Altered mentation '[]'$  - 0 Support Surface(s) Assessment (bed, cushion, seat,  etc.) INTERVENTIONS - Wound Cleansing / Measurement '[]'$  - 0 Simple Wound Cleansing - one wound X- 2 5 Complex Wound Cleansing - multiple wounds '[]'$  - 0 Wound Imaging (photographs - any number of wounds) '[]'$  - 0 Wound Tracing (instead of photographs) '[]'$  - 0 Simple Wound Measurement - one wound '[]'$  - 0 Complex Wound Measurement - multiple wounds INTERVENTIONS - Wound Dressings '[]'$  - 0 Small Wound Dressing one or multiple wounds '[]'$  - 0 Medium Wound Dressing one or multiple wounds X- 2 20 Large Wound Dressing one or multiple wounds X- 1 5 Application of Medications - topical '[]'$  - 0 Application of Medications - injection INTERVENTIONS - Miscellaneous '[]'$  - 0 External ear exam [name], [name] ([id number]) [id number] Page 3 of 6 '[]'$  - 0 Specimen Collection (cultures, biopsies, blood, body fluids, etc.) '[]'$  - 0 Specimen(s) / Culture(s) sent or taken to Lab for analysis '[]'$  - 0 Patient Transfer (multiple staff / Harrel Lemon Lift / Similar devices) '[]'$  - 0 Simple Staple / Suture removal (25 or less) '[]'$  - 0 Complex Staple / Suture removal (26 or more) '[]'$  - 0 Hypo / Hyperglycemic Management (close monitor of Blood Glucose) '[]'$  - 0 Ankle / Brachial Index (ABI) - do not check if billed separately '[]'$  - 0 Vital Signs Has the patient been seen at the hospital within the last three years: Yes Total Score: 105 Level Of Care: New/Established - Level 3 Electronic Signature(s) Signed: 09/20/2022 4:33:44 PM By: Massie Kluver Entered By: Massie Kluver on 09/19/2022 16:39:20 -------------------------------------------------------------------------------- Encounter Discharge Information Details Patient  Name: Date of Service: Kristine Mater [name]. 09/19/2022 3:45 PM Medical Record Number: [id number] Patient Account Number: [id number] Date of Birth/Sex: Treating [name]: [birth year] (66 y.o. [name] Primary Care Ketty Bitton: Jon Billings Other Clinician: Massie Kluver Referring Mccrae Speciale: Treating [name]/Extender: [name] in Treatment: 3 Encounter Discharge Information Items Discharge Condition: Stable Ambulatory Status: Ambulatory Discharge Destination: Home Transportation: Private Auto Accompanied By: self Schedule Follow-up Appointment: Yes Clinical Summary of Care: Electronic Signature(s) Signed: 09/20/2022 4:33:44 PM By: Massie Kluver Entered By: Massie Kluver on 09/19/2022 16:38:26 Wound Assessment Details -------------------------------------------------------------------------------- [name] ([id number]) [id number] Page 4 of 6 Patient Name: Date of Service: [name] 09/19/2022 3:45 PM Medical Record Number: [id number] Patient Account Number: [id number] Date of Birth/Sex: Treating [name]: 05/11/1956 (66 y.o. [name] Primary Care Cornelis Kluver: Jon Billings Other Clinician: Massie Kluver Referring Audie Wieser: Treating Kennede Lusk/Extender: [name] in Treatment: 3 Wound Status Wound Number: 2 Primary Trauma, Other Etiology: Wound Location: Left, Distal Upper Leg Wound Open Wounding Event: Trauma Status: Date Acquired: 08/11/2022 Comorbid Anemia, Hypertension, Lupus Erythematosus, Osteoarthritis, Weeks Of Treatment: 3 History: Received Radiation Clustered Wound: No Wound Measurements Length: (cm) 7.8 % Reduction in Area: 40% Width: (cm) 9.7 % Reduction in Volume: 40% Depth: (cm) 5 Area: (cm) 59.423 Volume: (cm) 297.116 Wound Description Classification: Full Thickness With Exposed Support Structures Foul Odor After Cleansing:  No Exudate Amount: Medium Exudate Type: Serosanguineous Exudate Color: red, brown Wound Bed Exposed Structure Fat Layer (Subcutaneous Tissue) Exposed: Yes Treatment Notes Wound #2 (Upper Leg) Wound Laterality: Left, Distal Cleanser Peri-Wound Care Skin Prep Discharge Instruction: Use skin prep as directed Topical Gentamicin Discharge Instruction: Apply as directed by [name]. Primary Dressing Gauze Discharge Instruction: moistened with Dakins Solution Prisma 4.34 (in) Discharge Instruction: pack dry prisma into tunnel Secondary Dressing ABD Pad 5x9 (in/in) Discharge Instruction: Cover with ABD pad Secured With [place]  H Soft Cloth Surgical T ape ape, 2x2 (in/yd) Compression Wrap Compression Stockings Add-Ons Electronic Signature(s) Signed: 09/20/2022 4:33:44 PM By: Massie Kluver Signed: 09/20/2022 4:40:57 PM By: [name] Entered By: Massie Kluver on 09/19/2022 16:04:38 [name] ([id number]) [id number] Page 5 of 6 -------------------------------------------------------------------------------- Wound Assessment Details Patient Name: Date of Service: [name] 09/19/2022 3:45 PM Medical Record Number: [id number] Patient Account Number: [id number] Date of Birth/Sex: Treating [name]: [birth year] (66 y.o. [name] Primary Care Edit Ricciardelli: Jon Billings Other Clinician: Massie Kluver Referring Damontay Alred: Treating Jasan Doughtie/Extender: [name] in Treatment: 3 Wound Status Wound Number: 3 Primary Trauma, Other Etiology: Wound Location: Right Knee Wound Open Wounding Event: Trauma Status: Date Acquired: 08/11/2022 Comorbid Anemia, Hypertension, Lupus Erythematosus, Osteoarthritis, Weeks Of Treatment: 3 History: Received Radiation Clustered Wound: No Wound Measurements Length: (cm) 3 Width: (cm) 5.5 Depth: (cm) 0.3 Area: (cm) 12.959 Volume: (cm) 3.888 %  Reduction in Area: -10% % Reduction in Volume: 80.6% Wound Description Classification: Full Thickness Without Exposed Support Exudate Amount: Large Exudate Type: Serosanguineous Exudate Color: red, brown Structures Wound Bed Exposed Structure Fat Layer (Subcutaneous Tissue) Exposed: Yes Treatment Notes Wound #3 (Knee) Wound Laterality: Right Cleanser Peri-Wound Care Skin Prep Discharge Instruction: Use skin prep as directed Topical Gentamicin Discharge Instruction: Apply as directed by Adriyana Greenbaum. Primary Dressing Hydrofera Blue Ready Transfer Foam, 4x5 (in/in) Discharge Instruction: Apply Hydrofera Blue Ready to wound bed as directed Secondary Dressing (BORDER) Zetuvit Plus SILICONE BORDER Dressing 5x5 (in/in) Discharge Instruction: Please do not put silicone bordered dressings under wraps. Use non-bordered dressing only. Secured With [employer] Compression Stockings Add-Ons MANIAH, NADING [name] ([id number]) [id number] Page 6 of 6 Electronic Signature(s) Signed: 09/20/2022 4:33:44 PM By: Massie Kluver Signed: 09/20/2022 4:40:57 PM By: [name] Entered By: Massie Kluver on 09/19/2022 16:04:43

## 2022-09-21 ENCOUNTER — Encounter: Payer: Medicare Other | Attending: Physician Assistant

## 2022-09-21 DIAGNOSIS — T792XXA Traumatic secondary and recurrent hemorrhage and seroma, initial encounter: Secondary | ICD-10-CM | POA: Diagnosis not present

## 2022-09-21 DIAGNOSIS — I10 Essential (primary) hypertension: Secondary | ICD-10-CM | POA: Insufficient documentation

## 2022-09-21 DIAGNOSIS — W540XXA Bitten by dog, initial encounter: Secondary | ICD-10-CM | POA: Insufficient documentation

## 2022-09-21 DIAGNOSIS — S8012XA Contusion of left lower leg, initial encounter: Secondary | ICD-10-CM | POA: Diagnosis not present

## 2022-09-21 DIAGNOSIS — L97823 Non-pressure chronic ulcer of other part of left lower leg with necrosis of muscle: Secondary | ICD-10-CM | POA: Diagnosis not present

## 2022-09-21 DIAGNOSIS — M328 Other forms of systemic lupus erythematosus: Secondary | ICD-10-CM | POA: Diagnosis not present

## 2022-09-21 DIAGNOSIS — M1990 Unspecified osteoarthritis, unspecified site: Secondary | ICD-10-CM | POA: Diagnosis not present

## 2022-09-21 DIAGNOSIS — D513 Other dietary vitamin B12 deficiency anemia: Secondary | ICD-10-CM | POA: Diagnosis not present

## 2022-09-21 DIAGNOSIS — L98492 Non-pressure chronic ulcer of skin of other sites with fat layer exposed: Secondary | ICD-10-CM | POA: Insufficient documentation

## 2022-09-21 DIAGNOSIS — L03116 Cellulitis of left lower limb: Secondary | ICD-10-CM | POA: Insufficient documentation

## 2022-09-21 DIAGNOSIS — S80812A Abrasion, left lower leg, initial encounter: Secondary | ICD-10-CM | POA: Diagnosis not present

## 2022-09-24 ENCOUNTER — Inpatient Hospital Stay: Payer: Medicare Other | Attending: Internal Medicine

## 2022-09-24 ENCOUNTER — Encounter: Payer: Medicare Other | Admitting: Physician Assistant

## 2022-09-24 DIAGNOSIS — D631 Anemia in chronic kidney disease: Secondary | ICD-10-CM | POA: Insufficient documentation

## 2022-09-24 DIAGNOSIS — L98492 Non-pressure chronic ulcer of skin of other sites with fat layer exposed: Secondary | ICD-10-CM | POA: Diagnosis not present

## 2022-09-24 DIAGNOSIS — E538 Deficiency of other specified B group vitamins: Secondary | ICD-10-CM | POA: Diagnosis not present

## 2022-09-24 DIAGNOSIS — L97122 Non-pressure chronic ulcer of left thigh with fat layer exposed: Secondary | ICD-10-CM | POA: Diagnosis not present

## 2022-09-24 DIAGNOSIS — T792XXA Traumatic secondary and recurrent hemorrhage and seroma, initial encounter: Secondary | ICD-10-CM | POA: Diagnosis not present

## 2022-09-24 DIAGNOSIS — L97812 Non-pressure chronic ulcer of other part of right lower leg with fat layer exposed: Secondary | ICD-10-CM | POA: Diagnosis not present

## 2022-09-24 DIAGNOSIS — M1990 Unspecified osteoarthritis, unspecified site: Secondary | ICD-10-CM | POA: Diagnosis not present

## 2022-09-24 DIAGNOSIS — M328 Other forms of systemic lupus erythematosus: Secondary | ICD-10-CM | POA: Diagnosis not present

## 2022-09-24 DIAGNOSIS — D513 Other dietary vitamin B12 deficiency anemia: Secondary | ICD-10-CM | POA: Diagnosis not present

## 2022-09-24 DIAGNOSIS — L97823 Non-pressure chronic ulcer of other part of left lower leg with necrosis of muscle: Secondary | ICD-10-CM | POA: Diagnosis not present

## 2022-09-24 DIAGNOSIS — S8012XA Contusion of left lower leg, initial encounter: Secondary | ICD-10-CM | POA: Diagnosis not present

## 2022-09-24 DIAGNOSIS — I10 Essential (primary) hypertension: Secondary | ICD-10-CM | POA: Diagnosis not present

## 2022-09-24 DIAGNOSIS — N183 Chronic kidney disease, stage 3 unspecified: Secondary | ICD-10-CM | POA: Diagnosis not present

## 2022-09-24 DIAGNOSIS — D649 Anemia, unspecified: Secondary | ICD-10-CM

## 2022-09-24 DIAGNOSIS — S80812A Abrasion, left lower leg, initial encounter: Secondary | ICD-10-CM | POA: Diagnosis not present

## 2022-09-24 DIAGNOSIS — L03116 Cellulitis of left lower limb: Secondary | ICD-10-CM | POA: Diagnosis not present

## 2022-09-24 LAB — CBC WITH DIFFERENTIAL/PLATELET
Abs Immature Granulocytes: 0.02 10*3/uL (ref 0.00–0.07)
Basophils Absolute: 0.1 10*3/uL (ref 0.0–0.1)
Basophils Relative: 1 %
Eosinophils Absolute: 0.2 10*3/uL (ref 0.0–0.5)
Eosinophils Relative: 3 %
HCT: 25.6 % — ABNORMAL LOW (ref 36.0–46.0)
Hemoglobin: 8.5 g/dL — ABNORMAL LOW (ref 12.0–15.0)
Immature Granulocytes: 0 %
Lymphocytes Relative: 25 %
Lymphs Abs: 1.8 10*3/uL (ref 0.7–4.0)
MCH: 32.4 pg (ref 26.0–34.0)
MCHC: 33.2 g/dL (ref 30.0–36.0)
MCV: 97.7 fL (ref 80.0–100.0)
Monocytes Absolute: 1 10*3/uL (ref 0.1–1.0)
Monocytes Relative: 14 %
Neutro Abs: 4 10*3/uL (ref 1.7–7.7)
Neutrophils Relative %: 57 %
Platelets: 602 10*3/uL — ABNORMAL HIGH (ref 150–400)
RBC: 2.62 MIL/uL — ABNORMAL LOW (ref 3.87–5.11)
RDW: 13.8 % (ref 11.5–15.5)
WBC: 7 10*3/uL (ref 4.0–10.5)
nRBC: 0 % (ref 0.0–0.2)

## 2022-09-24 LAB — BASIC METABOLIC PANEL
Anion gap: 9 (ref 5–15)
BUN: 27 mg/dL — ABNORMAL HIGH (ref 8–23)
CO2: 23 mmol/L (ref 22–32)
Calcium: 8.9 mg/dL (ref 8.9–10.3)
Chloride: 105 mmol/L (ref 98–111)
Creatinine, Ser: 1.52 mg/dL — ABNORMAL HIGH (ref 0.44–1.00)
GFR, Estimated: 37 mL/min — ABNORMAL LOW (ref >60)
Glucose, Bld: 102 mg/dL — ABNORMAL HIGH (ref 70–99)
Potassium: 4.4 mmol/L (ref 3.5–5.1)
Sodium: 137 mmol/L (ref 135–145)

## 2022-09-24 LAB — IRON AND TIBC
Iron: 87 ug/dL (ref 28–170)
Saturation Ratios: 31 % (ref 10.4–31.8)
TIBC: 286 ug/dL (ref 250–450)
UIBC: 199 ug/dL

## 2022-09-24 LAB — LACTATE DEHYDROGENASE: LDH: 124 U/L (ref 98–192)

## 2022-09-24 LAB — FERRITIN: Ferritin: 139 ng/mL (ref 11–307)

## 2022-09-24 LAB — VITAMIN B12: Vitamin B-12: 1361 pg/mL — ABNORMAL HIGH (ref 180–914)

## 2022-09-24 NOTE — Progress Notes (Addendum)
JALEYA, PEBLEY [name] ([id number]) [id number] Page 1 of 10 Visit Report for 09/24/2022 Arrival Information Details Patient Name: Date of Service: [name]. 09/24/2022 11:15 A M Medical Record Number: [id number] Patient Account Number: [id number] Date of Birth/Sex: Treating RN: 01/19/1956 (66 y.o. [name]: [name] Other Clinician: Massie [name] Referring [name]: [name] in Treatment: 4 Visit Information History Since Last Visit All ordered tests and consults were completed: No Patient Arrived: Ambulatory Added or deleted any medications: No Arrival Time: 11:30 Any new allergies or adverse reactions: No Transfer Assistance: None Had a fall or experienced change in No Patient Identification Verified: Yes activities of daily living that may affect Secondary Verification Process Completed: Yes risk of falls: Patient Requires Transmission-Based Precautions: No Signs or symptoms of abuse/neglect since last visito No Patient Has Alerts: Yes Hospitalized since last visit: No Patient Alerts: NOT Diabetic Implantable device outside of the clinic excluding No cellular tissue based products placed in the center since last visit: Has Dressing in Place as Prescribed: Yes Pain Present Now: No Electronic Signature(s) Signed: 09/24/2022 4:50:46 PM By: [name] Entered By: [name] on 09/24/2022 11:36:15 -------------------------------------------------------------------------------- Clinic Level of [name] Assessment Details Patient Name: Date of Service: [name]. 09/24/2022 11:15 A M Medical Record Number: [id number] Patient Account Number: [id number] Date of Birth/Sex: Treating RN: 1956/02/22 (66 y.o. [name] Axel Meas: [name] Other Clinician: Massie [name] Referring Anniah Glick: Treating Waldron Gerry/Extender:  [name] in Treatment: 4 Clinic Level of [name] Assessment Items TOOL 4 Quantity Score '[]'$  - 0 Use when only an EandM is performed on FOLLOW-UP visit ASSESSMENTS - Nursing Assessment / Reassessment X- 1 10 Reassessment of Co-morbidities (includes updates in patient status) X- 1 5 Reassessment of Adherence to Treatment Plan ETHELDA, DEANGELO [name] ([id number]) [id number] Page 2 of 10 ASSESSMENTS - Wound and Skin A ssessment / Reassessment '[]'$  - 0 Simple Wound Assessment / Reassessment - one wound X- 2 5 Complex Wound Assessment / Reassessment - multiple wounds '[]'$  - 0 Dermatologic / Skin Assessment (not related to wound area) ASSESSMENTS - Focused Assessment '[]'$  - 0 Circumferential Edema Measurements - multi extremities '[]'$  - 0 Nutritional Assessment / Counseling / Intervention '[]'$  - 0 Lower Extremity Assessment (monofilament, tuning fork, pulses) '[]'$  - 0 Peripheral Arterial Disease Assessment (using hand held doppler) ASSESSMENTS - Ostomy and/or Continence Assessment and [name] '[]'$  - 0 Incontinence Assessment and Management '[]'$  - 0 Ostomy [name] Assessment and Management (repouching, etc.) PROCESS - Coordination of [name] X - Simple Patient / Family Education for ongoing [name] 1 15 '[]'$  - 0 Complex (extensive) Patient / Family Education for ongoing [name] '[]'$  - 0 Staff obtains Programmer, systems, Records, T Results / Process Orders est '[]'$  - 0 Staff telephones HHA, Nursing Homes / Clarify orders / etc '[]'$  - 0 Routine Transfer to another Facility (non-emergent condition) '[]'$  - 0 Routine Hospital Admission (non-emergent condition) '[]'$  - 0 New Admissions / Biomedical engineer / Ordering NPWT Apligraf, etc. , '[]'$  - 0 Emergency Hospital Admission (emergent condition) X- 1 10 Simple Discharge Coordination '[]'$  - 0 Complex (extensive) Discharge Coordination PROCESS - Special Needs '[]'$  - 0 Pediatric / Minor Patient Management '[]'$  - 0 Isolation Patient  Management '[]'$  - 0 Hearing / Language / Visual special needs '[]'$  - 0 Assessment of Community assistance (transportation, D/C planning, etc.) '[]'$  - 0 Additional assistance / Altered mentation '[]'$  - 0 Support Surface(s) Assessment (bed,  cushion, seat, etc.) INTERVENTIONS - Wound Cleansing / Measurement '[]'$  - 0 Simple Wound Cleansing - one wound X- 2 5 Complex Wound Cleansing - multiple wounds X- 1 5 Wound Imaging (photographs - any number of wounds) '[]'$  - 0 Wound Tracing (instead of photographs) '[]'$  - 0 Simple Wound Measurement - one wound X- 2 5 Complex Wound Measurement - multiple wounds INTERVENTIONS - Wound Dressings '[]'$  - 0 Small Wound Dressing one or multiple wounds X- 2 15 Medium Wound Dressing one or multiple wounds '[]'$  - 0 Large Wound Dressing one or multiple wounds X- 1 5 Application of Medications - topical '[]'$  - 0 Application of Medications - injection INTERVENTIONS - Miscellaneous '[]'$  - 0 External ear exam RAVENNE, WAYMENT [name] ([id number]) [id number] Page 3 of 10 '[]'$  - 0 Specimen Collection (cultures, biopsies, blood, body fluids, etc.) '[]'$  - 0 Specimen(s) / Culture(s) sent or taken to Lab for analysis '[]'$  - 0 Patient Transfer (multiple staff / Harrel Lemon Lift / Similar devices) '[]'$  - 0 Simple Staple / Suture removal (25 or less) '[]'$  - 0 Complex Staple / Suture removal (26 or more) '[]'$  - 0 Hypo / Hyperglycemic Management (close monitor of Blood Glucose) '[]'$  - 0 Ankle / Brachial Index (ABI) - do not check if billed separately X- 1 5 Vital Signs Has the patient been seen at the hospital within the last three years: Yes Total Score: 115 Level Of [name]: New/Established - Level 3 Electronic Signature(s) Signed: 09/24/2022 4:50:46 PM By: [name] Entered By: [name] on 09/24/2022 12:07:58 -------------------------------------------------------------------------------- Encounter Discharge Information Details Patient Name: Date of  Service: [name]. 09/24/2022 11:15 A M Medical Record Number: [id number] Patient Account Number: [id number] Date of Birth/Sex: Treating RN: 1955-12-03 (66 y.o. [name] Andi [place]field: [name] Other Clinician: Massie [name] Referring Mamoudou Mulvehill: Treating Aryelle Figg/Extender: [name] in Treatment: 4 Encounter Discharge Information Items Discharge Condition: Stable Ambulatory Status: Ambulatory Discharge Destination: Home Transportation: Private Auto Accompanied By: self Schedule Follow-up Appointment: Yes Clinical Summary of [name]: Electronic Signature(s) Signed: 09/24/2022 4:50:46 PM By: [name] Entered By: [name] on 09/24/2022 12:58:32 Lower Extremity Assessment Details -------------------------------------------------------------------------------- [name] ([id number]) [id number] Page 4 of 10 Patient Name: Date of Service: [name]. 09/24/2022 11:15 A M Medical Record Number: [id number] Patient Account Number: [id number] Date of Birth/Sex: Treating RN: 10/15/1956 (66 y.o. [name] Aimar Borghi: [name] Other Clinician: Massie [name] Referring Ziva Nunziata: Treating Kamdon Reisig/Extender: [name] in Treatment: 4 Electronic Signature(s) Signed: 09/24/2022 2:54:45 PM By: Gretta Cool BSN, RN, CWS, [name] RN, BSN Signed: 09/24/2022 4:50:46 PM By: [name] Entered By: [name] on 09/24/2022 11:55:59 -------------------------------------------------------------------------------- Multi Wound Chart Details Patient Name: Date of Service: [name]. 09/24/2022 11:15 A M Medical Record Number: [id number] Patient Account Number: [id number] Date of Birth/Sex: Treating RN: Dec 15, 1955 (66 y.o. [name] Alishia Lebo: [name] Other Clinician: Massie [name] Referring Tacori Kvamme: Treating  Kelcey Korus/Extender: [name] in Treatment: 4 Vital Signs Height(in): Pulse(bpm): 60 Weight(lbs): Blood Pressure(mmHg): 122/70 Body Mass Index(BMI): Temperature(F): 97.5 Respiratory Rate(breaths/min): 16 [2:Photos:] [N/A:N/A] Left, Distal Upper Leg Right Knee N/A Wound Location: Trauma Trauma N/A Wounding Event: Trauma, Other Trauma, Other N/A Primary Etiology: Anemia, Hypertension, Lupus Anemia, Hypertension, Lupus N/A Comorbid History: Erythematosus, Osteoarthritis, Erythematosus, Osteoarthritis, Received Radiation Received Radiation 08/11/2022 08/11/2022 N/A Date Acquired: 4 4 N/A Weeks of Treatment: Open Open N/A Wound Status: No No N/A Wound Recurrence:  7x8x0.2 4.4x2.5x0.1 N/A Measurements L x W x D (cm) 43.982 8.639 N/A A (cm) : rea 8.796 0.864 N/A Volume (cm) : 55.60% 26.70% N/A % Reduction in A rea: 98.20% 95.70% N/A % Reduction in Volume: 11 Position 1 (o'clock): 1 Maximum Distance 1 (cm): 12 Position 2 (o'clock): 3 Maximum Distance 2 (cm): Yes No N/A Tunneling: Full Thickness With Exposed Support Full Thickness Without Exposed N/A Classification: Structures Support Structures Medium Large N/A Exudate Amount: Serosanguineous Serosanguineous N/A Exudate TypeTAYLIA, BERBER [name] ([id number]) [id number] Page 5 of 10 red, brown red, brown N/A Exudate Color: N/A Small (1-33%) N/A Granulation A mount: N/A None Present (0%) N/A Necrotic A mount: Fat Layer (Subcutaneous Tissue): Yes Fat Layer (Subcutaneous Tissue): Yes N/A Exposed Structures: N/A None N/A Epithelialization: Treatment Notes Electronic Signature(s) Signed: 09/24/2022 4:50:46 PM By: [name] Entered By: [name] on 09/24/2022 11:56:04 -------------------------------------------------------------------------------- [place] Details Patient Name: Date of Service: [name].  09/24/2022 11:15 A M Medical Record Number: [id number] Patient Account Number: [id number] Date of Birth/Sex: Treating RN: May 11, 1956 (66 y.o. [name], [name] Primary [name] Erikson Danzy: [name] Other Clinician: Massie [name] Referring Mckinzee Spirito: Treating Dilan Novosad/Extender: [name] in Treatment: 4 Active Inactive Abuse / Safety / Falls / Self [name] Management Nursing Diagnoses: History of Falls Goals: Patient will remain injury free related to falls Date Initiated: 08/24/2022 Target Resolution Date: 08/24/2022 Goal Status: Active Patient/caregiver will verbalize understanding of skin [name] regimen Date Initiated: 08/24/2022 Target Resolution Date: 08/24/2022 Goal Status: Active Interventions: Assess fall risk on admission and as needed Notes: Orientation to the Wound [name] Program Nursing Diagnoses: Knowledge deficit related to the wound healing center program Goals: Patient/caregiver will verbalize understanding of the [place] Date Initiated: 08/24/2022 Target Resolution Date: 08/24/2022 Goal Status: Active Interventions: Provide education on orientation to the wound center Notes: Wound/Skin Impairment Nursing Diagnoses: Impaired tissue integrity ABUK, SELLECK [name] ([id number]) [id number] Page 6 of 10 Knowledge deficit related to ulceration/compromised skin integrity Goals: Patient/caregiver will verbalize understanding of skin [name] regimen Date Initiated: 08/24/2022 Target Resolution Date: 08/24/2022 Goal Status: Active Ulcer/skin breakdown will have a volume reduction of 30% by week 4 Date Initiated: 08/24/2022 Target Resolution Date: 09/21/2022 Goal Status: Active Interventions: Assess patient/caregiver ability to obtain necessary supplies Assess ulceration(s) every visit Treatment Activities: Referred to DME Chaia Ikard for dressing supplies : 08/24/2022 Skin [name] regimen initiated :  08/24/2022 Topical wound management initiated : 08/24/2022 Notes: Electronic Signature(s) Signed: 09/24/2022 2:54:45 PM By: Gretta Cool, BSN, RN, CWS, [name] RN, BSN Signed: 09/24/2022 4:50:46 PM By: [name] Entered By: [name] on 09/24/2022 12:56:51 -------------------------------------------------------------------------------- Pain Assessment Details Patient Name: Date of Service: [name]. 09/24/2022 11:15 A M Medical Record Number: [id number] Patient Account Number: [id number] Date of Birth/Sex: Treating RN: 1956-08-12 (66 y.o. [name] Sydnie Sigmund: [name] Other Clinician: Massie [name] Referring Sara Keys: Treating Estela Vinal/Extender: [name] in Treatment: 4 Active Problems Location of Pain Severity and Description of Pain Patient Has Paino No Site Locations Pain Management and Medication Current Pain Management: NANDI, TONNESEN [name] ([id number]) [id number] Page 7 of 10 Electronic Signature(s) Signed: 09/24/2022 2:54:45 PM By: Gretta Cool, BSN, RN, CWS, [name] RN, BSN Signed: 09/24/2022 4:50:46 PM By: [name] Entered By: [name] on 09/24/2022 11:41:03 -------------------------------------------------------------------------------- Patient/Caregiver Education Details Patient Name: Date of Service: [name]. 12/4/2023andnbsp11:15 [place] Record Number: [id number] Patient Account Number: [id number] Date of Birth/Gender: Treating RN: 1956/02/23 [phone]  y.o. [name] Physician: [name] Other Clinician: Massie [name] Referring Physician: Treating Physician/Extender: [name] in Treatment: 4 Education Assessment Education Provided To: Patient Education Topics Provided Wound/Skin Impairment: Handouts: Other: continue wound [name] as directed Methods: Explain/Verbal Responses: State content correctly Electronic  Signature(s) Signed: 09/24/2022 4:50:46 PM By: [name] Entered By: [name] on 09/24/2022 12:56:45 -------------------------------------------------------------------------------- Wound Assessment Details Patient Name: Date of Service: [name]. 09/24/2022 11:15 A M Medical Record Number: [id number] Patient Account Number: [id number] Date of Birth/Sex: Treating RN: 08/21/1956 (66 y.o. [name] Carlethia Mesquita: [name] Other Clinician: Massie [name] Referring Pravin Perezperez: Treating Aliviana Burdell/Extender: [name] in Treatment: 4 Wound Status Wound Number: 2 Primary Trauma, Other Etiology: Wound Location: Left, Distal Upper Leg Wound Open Wounding Event: Trauma Status: Date Acquired: 08/11/2022 Comorbid Anemia, Hypertension, Lupus Erythematosus, Osteoarthritis, Weeks Of Treatment: 4 History: Received Radiation BANEEN, WIESELER [name] ([id number]) [id number] Page 8 of 10 History: Received Radiation Clustered Wound: No Photos Wound Measurements Length: (cm) 7 Width: (cm) 8 Depth: (cm) 0.2 Area: (cm) 43.982 Volume: (cm) 8.796 % Reduction in Area: 55.6% % Reduction in Volume: 98.2% Tunneling: Yes Location 1 Position (o'clock): 11 Maximum Distance: (cm) 1 Location 2 Position (o'clock): 12 Maximum Distance: (cm) 3 Undermining: No Wound Description Classification: Full Thickness With Exposed Support S Exudate Amount: Medium Exudate Type: Serosanguineous Exudate Color: red, brown tructures Foul Odor After Cleansing: No Wound Bed Exposed Structure Fat Layer (Subcutaneous Tissue) Exposed: Yes Treatment Notes Wound #2 (Upper Leg) Wound Laterality: Left, Distal Cleanser Peri-Wound [name] Skin Prep Discharge Instruction: Use skin prep as directed Topical Gentamicin Discharge Instruction: Apply as directed by Annasofia Pohl. Primary Dressing Gauze Discharge Instruction: moistened with  Dakins Solution Hydrofera Blue Ready Transfer Foam, 2.5x2.5 (in/in) Discharge Instruction: Apply Hydrofera Blue Ready to wound bed as directed Prisma 4.34 (in) Discharge Instruction: pack dry prisma into tunnel Secondary Dressing ABD Pad 5x9 (in/in) Discharge Instruction: Cover with ABD pad Secured With [employer] [facility] Soft Cloth Surgical T ape ape, 2x2 (in/yd) Compression Wrap Compression Stockings Add-Ons DAZARIA, MACNEILL [name] ([id number]) [id number] Page 9 of 10 Electronic Signature(s) Signed: 09/24/2022 2:54:45 PM By: Gretta Cool, BSN, RN, CWS, [name] RN, BSN Signed: 09/24/2022 4:50:46 PM By: [name] Entered By: [name] on 09/24/2022 11:55:05 -------------------------------------------------------------------------------- Wound Assessment Details Patient Name: Date of Service: [name]. 09/24/2022 11:15 A M Medical Record Number: [id number] Patient Account Number: [id number] Date of Birth/Sex: Treating RN: 1955-12-18 (66 y.o. [name], [name] Primary [name] Benjie Ricketson: [name] Other Clinician: Massie [name] Referring Curstin Schmale: Treating Velton Roselle/Extender: [name] in Treatment: 4 Wound Status Wound Number: 3 Primary Trauma, Other Etiology: Wound Location: Right Knee Wound Open Wounding Event: Trauma Status: Date Acquired: 08/11/2022 Comorbid Anemia, Hypertension, Lupus Erythematosus, Osteoarthritis, Weeks Of Treatment: 4 History: Received Radiation Clustered Wound: No Photos Wound Measurements Length: (cm) 4.4 Width: (cm) 2.5 Depth: (cm) 0.1 Area: (cm) 8.639 Volume: (cm) 0.864 % Reduction in Area: 26.7% % Reduction in Volume: 95.7% Epithelialization: None Tunneling: No Undermining: No Wound Description Classification: Full Thickness Without Exposed Suppor Exudate Amount: Large Exudate Type: Serosanguineous Exudate Color: red, brown t Structures Foul Odor After Cleansing:  No Slough/Fibrino Yes Wound Bed Granulation Amount: Small (1-33%) Exposed Structure Granulation Quality: Hyper-granulation Fat Layer (Subcutaneous Tissue) Exposed: Yes Necrotic Amount: None Present (0%) Treatment Notes Wound #3 (Knee) Wound Laterality: Right KERRIANNE, JENG [name] ([id number]) [id number] Page 10 of 10 Peri-Wound [name]  Skin Prep Discharge Instruction: Use skin prep as directed Topical Gentamicin Discharge Instruction: Apply as directed by Ciin Brazzel. Primary Dressing Hydrofera Blue Ready Transfer Foam, 4x5 (in/in) Discharge Instruction: Apply Hydrofera Blue Ready to wound bed as directed Secondary Dressing (BORDER) Zetuvit Plus SILICONE BORDER Dressing 5x5 (in/in) Discharge Instruction: Please do not put silicone bordered dressings under wraps. Use non-bordered dressing only. Secured With Compression Wrap Compression Stockings Environmental education officer) Signed: 09/24/2022 2:54:45 PM By: Gretta Cool, BSN, RN, CWS, [name] RN, BSN Signed: 09/24/2022 4:50:46 PM By: [name] Entered By: [name] on 09/24/2022 11:55:42 -------------------------------------------------------------------------------- Vitals Details Patient Name: Date of Service: [name]. 09/24/2022 11:15 A M Medical Record Number: [id number] Patient Account Number: [id number] Date of Birth/Sex: Treating RN: January 23, 1956 (66 y.o. [name] Aamira Bischoff: [name] Other Clinician: Massie [name] Referring Giovanie Lefebre: Treating Shavone Nevers/Extender: [name] in Treatment: 4 Vital Signs Time Taken: 11:36 Temperature (F): 97.5 Pulse (bpm): 80 Respiratory Rate (breaths/min): 16 Blood Pressure (mmHg): 122/70 Reference Range: 80 - 120 mg / dl Electronic Signature(s) Signed: 09/24/2022 4:50:46 PM By: [name] Entered By: [name] on 09/24/2022 11:40:59

## 2022-09-24 NOTE — Progress Notes (Addendum)
Kristine, [name] [name] ([id number]) [id number] Page 1 of 8 Visit Report for 09/24/2022 Chief Complaint Document Details Patient Name: Date of Service: [name]. 09/24/2022 11:15 A M Medical Record Number: [id number] Patient Account Number: [id number] Date of Birth/Sex: Treating RN: 20-Apr-1956 (66 y.o. [name] Primary Care Provider: Jon Billings Other Clinician: Massie Kluver Referring Provider: Treating Provider/Extender: Venia Minks in Treatment: 4 Information Obtained from: Patient Chief Complaint Right knee and left thigh ulcers secondary to a dog bite and fall Electronic Signature(s) Signed: 09/24/2022 11:35:28 AM By: [name] PA-C Entered By: [name] on 09/24/2022 11:35:28 -------------------------------------------------------------------------------- HPI Details Patient Name: Date of Service: [name]. 09/24/2022 11:15 A M Medical Record Number: [id number] Patient Account Number: [id number] Date of Birth/Sex: Treating RN: 02-16-1956 (66 y.o. [name] Primary Care Provider: Jon Billings Other Clinician: Massie Kluver Referring Provider: Treating Provider/Extender: Venia Minks in Treatment: 4 History of Present Illness HPI Description: 08-24-2022 patient presents today for initial evaluation here in our clinic concerning issues that she has been having with wounds in 3 locations. 1 areas in the proximal left thigh this is due to a fall that she sustained on 08-11-2022. Subsequently she has then a second issue in the distal thigh as well as the right knee location. These are due to a unusual situation where she was actually leaving had just been petting her grand dog when she walked to her car. She tells me that she got dizzy and stumbled and fell and when she fell she called out. When she did this subsequently her grand dog ran over were not sure  if that he thought he was playing with her what but nonetheless ended up biting her on the left thigh as well as on the right knee. Here she had 2 areas of laceration which she subsequently went to the ER for and they attempted to suture both closed. Unfortunately that just did not take. At this point [name] think the sutures can need to be removed from both locations and to be honest there is a significant area of necrosis on the left thigh this may need to be cleaned out and away. Fortunately I do not see any signs of systemic infection though locally there is some evidence of infection she has been on Augmentin which just recently switched to doxycycline no cultures been performed at this point. From the standpoint of past medical history the patient does have a history of anemia which they believe is related to B12. She does not have diabetes she does have a history of lupus however. She is a former smoker. 11/7; this is a patient who has a severe dog bite injury on her upper anterior left thigh. Also an area on the right medial knee. We have been seeing her daily and using Dakin's wet-to-dry. [name] was shown yesterday the culture of drainage that was done last week that shows Pseudomonas. She has multiple drug interactions with quinolones therefore [name] Kristine, [name] [name] ([id number]) [id number] Page 2 of 8 gave her a third-generation cephalosporin. HOWEVER apparently the pharmacy would not fill the prescription because of the concerns about allergic reaction to the capsule itself related to beef products. [name] could not find this anywhere even on Google however she has taken capsule antibiotics in the past and [name] told her [name] think that this risk is small compared to not treating the Pseudomonas. She has not been systemically unwell she is  in some discomfort 09-04-2022 upon evaluation today patient unfortunately he is continuing to have some significant issues here with her leg  although I do feel like she is better than she was last week. Fortunately there does not appear to be any evidence of systemic infection though locally we definitely noted Pseudomonas as the causative agent on the left thigh location with a culture that needed from the deep tissue wound area. Subsequently the only active antibiotics that are going to take care of this are Cipro, Levaquin, and Baxdela. Unfortunately the Cipro and Levaquin cannot be taken by her due to the interaction with Plaquenil causing a QT prolongation which is good to be a significant issue. I do not want that to be a complicating factor and therefore we need to look toward the [place] which [name] understand is definitely expensive but [name] am not sure if there is another option to be perfectly honest. [name] discussed this with the patient today and she is in agreement with plan we have also made a referral to infectious disease though if we can get this treated with Baxdela then she may not even end up requiring the infectious disease referral. Upon inspection patient's wound bed actually showed signs of 09-11-2022 upon evaluation today patient appears to be doing well currently in regard to her wound. She is actually making progress and we actually did find out today that we did get the [place] approved. This is great news as [name] think it will probably prevent her from having to go to the infectious disease doctors will get the infection under control as it stands right now. She is very pleased to hear this and [name] likewise [name] am extremely pleased. It still can be quite pricey it is good to be $350 with her insurance but that is better than close to 2000 without. 09-17-2022 upon evaluation today patient appears to be doing well currently in regard to her wounds. She has been tolerating the dressing changes without complication. Fortunately I do not see any evidence of infection at this point which is great news we still had a tremendously  difficult period of time trying to get the [place] approved and then once we got it approved apparently the pharmacy was unable to actually order the medication. This has been an extremely frustrating process in general. The patient has been extremely frustrated. 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 Nonetheless I do believe that this is still the best way to go with the topical gentamicin. 09-24-2022 upon evaluation today patient appears to be doing well with regard to her wounds she is actually making excellent progress and [name] think we are ready to switch to [facility] for the left leg as well we have already been using this on the right knee with great results. Electronic Signature(s) Signed: 09/24/2022 1:27:37 PM By: [name] PA-C Entered By: [name] on 09/24/2022 13:27:37 -------------------------------------------------------------------------------- Physical Exam Details Patient Name: Date of Service: [name]. 09/24/2022 11:15 A M Medical Record Number: [id number] Patient Account Number: [id number] Date of Birth/Sex: Treating RN: November 03, 1955 (66 y.o. [name] Primary Care Provider: Jon Billings Other Clinician: Massie Kluver Referring Provider: Treating Provider/Extender: Venia Minks in Treatment: 4 Constitutional Well-nourished  and well-hydrated in no acute distress. Respiratory normal breathing without difficulty. Psychiatric this patient is able to make decisions and demonstrates good insight into disease process. Alert and Oriented x 3. pleasant and cooperative. Notes Upon inspection patient's wound bed actually showed signs of good granulation Seshan [name] am actually seeing no need for sharp  debridement today which is great news and overall [name] am extremely pleased and with where we stand. No fevers, chills, nausea, vomiting, or diarrhea. Electronic Signature(s) Signed: 09/24/2022 1:27:53 PM By: [name] PA-C Entered By: [name] on 09/24/2022 13:27:53 Len, Kluver [place] [name] ([id number]) [id number] Page 3 of 8 -------------------------------------------------------------------------------- Physician Orders Details Patient Name: Date of Service: [name]. 09/24/2022 11:15 A M Medical Record Number: [id number] Patient Account Number: [id number] Date of Birth/Sex: Treating RN: [birth year] (66 y.o. 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 Nurse Visit as needed - nurse visit once weekly Anesthetic (Use 'Patient Medications' Section for Anesthetic Order Entry) Lidocaine applied to wound bed Edema Control - Lymphedema / Segmental Compressive Device / Other Elevate, Exercise Daily and A void Standing for Long Periods of Time. Elevate legs to the level of the heart and pump ankles as often as possible Elevate leg(s) parallel  to the floor when sitting. Medications-Please add to medication list. ntibiotic - apply thin layer of Gentamycin to wound Topical A Wound Treatment Wound #2 - Upper Leg Wound Laterality: Left, Distal Peri-Wound Care: Skin Prep 3 x Per Week/30 Days Discharge Instructions: Use skin prep as directed Topical: Gentamicin 3 x Per Week/30 Days Discharge Instructions: Apply as directed by provider. Prim Dressing: Gauze 3 x Per Week/30 Days ary Discharge Instructions: moistened with Dakins Solution Prim Dressing: Hydrofera Blue Ready Transfer Foam, 2.5x2.5 (in/in) (DME) (Dispense As Written) 3 x Per Week/30 Days ary Discharge Instructions: Apply Hydrofera Blue Ready to wound bed as directed Prim Dressing: Prisma 4.34 (in) 3 x Per Week/30 Days ary Discharge Instructions: pack dry prisma into tunnel Secondary Dressing: ABD Pad 5x9 (in/in) 3 x Per Week/30 Days Discharge Instructions: Cover with ABD pad Secured With: Medipore T - 47M Medipore H Soft Cloth Surgical T ape ape, 2x2 (in/yd) 3 x Per Week/30 Days Wound #3 - Knee Wound Laterality: Right [name], [name] ([id number]) [id number] Page 4 of 8 Peri-Wound Care: Skin Prep 3 x Per Week/30 Days Discharge Instructions: Use skin prep as directed Topical: Gentamicin 3 x Per Week/30 Days Discharge Instructions: Apply as directed by provider. Prim Dressing: Hydrofera Blue Ready Transfer Foam, 4x5 (in/in) 3 x Per Week/30 Days ary Discharge Instructions: Apply Hydrofera Blue Ready to wound bed as directed Secondary Dressing: (BORDER) Zetuvit Plus SILICONE BORDER Dressing 5x5 (in/in) 3 x Per Week/30 Days Discharge Instructions: Please do not put silicone bordered dressings under wraps. Use non-bordered dressing only. Patient Medications llergies: Sulfa (Sulfonamide Antibiotics), beef derived (  bovine), lamb, milk A Notifications Medication Indication Start End 09/28/2022 gentamicin DOSE topical 0.1 % cream - cream  topical Applied with each dressing change 3 times per week for 30 days. Electronic Signature(s) Signed: 09/28/2022 8:30:19 AM By: [name] PA-C Previous Signature: 09/24/2022 4:48:10 PM Version By: [name] PA-C Previous Signature: 09/24/2022 4:50:46 PM Version By: Massie Kluver Entered By: [name] on 09/28/2022 08:30:18 -------------------------------------------------------------------------------- Problem List Details Patient Name: Date of Service: [name]. 09/24/2022 11:15 A M Medical Record Number: [id number] Patient Account Number: [id number] Date of Birth/Sex: Treating RN: 1956-09-18 (66 y.o. [name] Primary Care Provider: Jon Billings Other Clinician: Massie Kluver Referring Provider: Treating Provider/Extender: Venia Minks in Treatment: 4 Active Problems ICD-10 Encounter Code Description Active Date MDM Diagnosis W54.0XXA Bitten by dog, initial encounter 08/24/2022 No Yes L03.116 Cellulitis of left lower limb 09/04/2022 No Yes S80.12XA Contusion of left lower leg, initial encounter 08/24/2022 No Yes T79.2XXA Traumatic secondary and recurrent hemorrhage and seroma, initial encounter 08/24/2022 No Yes S80.812A Abrasion, left lower leg, initial encounter 08/24/2022 No Yes [name], [name] ([id number]) [id number] Page 5 of 8 [phone] Non-pressure chronic ulcer of other part of left lower leg with necrosis of 08/24/2022 No Yes muscle L98.492 Non-pressure chronic ulcer of skin of other sites with fat layer exposed 08/24/2022 No Yes D51.3 Other dietary vitamin B12 deficiency anemia 08/24/2022 No Yes M32.8 Other forms of systemic lupus erythematosus 08/24/2022 No Yes Inactive Problems Resolved Problems Electronic Signature(s) Signed: 09/24/2022 11:35:25 AM By: [name] PA-C Entered By: [name] on 09/24/2022  11:35:24 -------------------------------------------------------------------------------- Progress Note Details Patient Name: Date of Service: [name]. 09/24/2022 11:15 A M Medical Record Number: [id number] Patient Account Number: [id number] Date of Birth/Sex: Treating RN: 1956/03/04 (66 y.o. [name] Primary Care Provider: Jon Billings Other Clinician: Massie Kluver Referring Provider: Treating Provider/Extender: Venia Minks in Treatment: 4 Subjective Chief Complaint Information obtained from Patient Right knee and left thigh ulcers secondary to a dog bite and fall History of Present Illness (HPI) 08-24-2022 patient presents today for initial evaluation here in our clinic concerning issues that she has been having with wounds in 3 locations. 1 areas in the proximal left thigh this is due to a fall that she sustained on 08-11-2022. Subsequently she has then a second issue in the distal thigh as well as the right knee location. These are due to a unusual situation where she was actually leaving had just been petting her grand dog when she walked to her car. She tells me that she got dizzy and stumbled and fell and when she fell she called out. When she did this subsequently her grand dog ran over were not sure if that he thought he was playing with her what but nonetheless ended up biting her on the left thigh as well as on the right knee. Here she had 2 areas of laceration which she subsequently went to the ER for and they attempted to suture both closed. Unfortunately that just did not take. At this point [name] think the sutures can need to be removed from both locations and to be honest there is a significant area of necrosis on the left thigh this may need to be cleaned out and away. Fortunately I do not see any signs of systemic infection though locally there is some evidence of infection she has been on Augmentin which just recently switched  to doxycycline  no cultures been performed at this point. From the standpoint of past medical history the patient does have a history of anemia which they believe is related to B12. She does not have diabetes she does have a history of lupus however. She is a former smoker. 11/7; this is a patient who has a severe dog bite injury on her upper anterior left thigh. Also an area on the right medial knee. We have been seeing her daily and using Dakin's wet-to-dry. [name] was shown yesterday the culture of drainage that was done last week that shows Pseudomonas. She has multiple drug interactions with quinolones therefore [name] gave her a third-generation cephalosporin. HOWEVER apparently the pharmacy would not fill the prescription because of the concerns about allergic reaction to the capsule itself related to beef products. [name] could not find this anywhere even on Google however she has taken capsule antibiotics in the past and [name] told her [name] think that this risk is small compared to not treating the Pseudomonas. She has not been systemically unwell she is in some discomfort 09-04-2022 upon evaluation today patient unfortunately he is continuing to have some significant issues here with her leg although I do feel like she is better [name], [name] ([id number]) [id number] Page 6 of 8 than she was last week. Fortunately there does not appear to be any evidence of systemic infection though locally we definitely noted Pseudomonas as the causative agent on the left thigh location with a culture that needed from the deep tissue wound area. Subsequently the only active antibiotics that are going to take care of this are Cipro, Levaquin, and Baxdela. Unfortunately the Cipro and Levaquin cannot be taken by her due to the interaction with Plaquenil causing a QT prolongation which is good to be a significant issue. I do not want that to be a complicating factor and therefore we need to look  toward the [place] which [name] understand is definitely expensive but [name] am not sure if there is another option to be perfectly honest. [name] discussed this with the patient today and she is in agreement with plan we have also made a referral to infectious disease though if we can get this treated with Baxdela then she may not even end up requiring the infectious disease referral. Upon inspection patient's wound bed actually showed signs of 09-11-2022 upon evaluation today patient appears to be doing well currently in regard to her wound. She is actually making progress and we actually did find out today that we did get the [place] approved. This is great news as [name] think it will probably prevent her from having to go to the infectious disease doctors will get the infection under control as it stands right now. She is very pleased to hear this and [name] likewise [name] am extremely pleased. It still can be quite pricey it is good to be $350 with her insurance but that is better than close to 2000 without. 09-17-2022 upon evaluation today patient appears to be doing well currently in regard to her wounds. She has been tolerating the dressing changes without complication. Fortunately I do not see any evidence of infection at this point which is great news we still had a tremendously difficult period of time trying to get the [place] approved and then once we got it approved apparently the pharmacy was unable to actually order the medication. This has been an extremely frustrating process in general. The patient has been extremely frustrated. Nonetheless she seems to actually be making  excellent progress and in fact [name] am not even certain is good to be necessary to use the oral antibiotics at this point which will definitely save her a lot of money which she is very pleased about and to employees. Nonetheless I do believe that this is still the best way to go with the topical gentamicin. 09-24-2022 upon evaluation today  patient appears to be doing well with regard to her wounds she is actually making excellent progress and [name] think we are ready to switch to [facility] for the left leg as well we have already been using this on the right knee with great results. Objective Constitutional Well-nourished and well-hydrated in no acute distress. Vitals Time Taken: 11:36 AM, Temperature: 97.5 F, Pulse: 80 bpm, Respiratory Rate: 16 breaths/min, Blood Pressure: 122/70 mmHg. Respiratory normal breathing without difficulty. Psychiatric this patient is able to make decisions and demonstrates good insight into disease process. Alert and Oriented x 3. pleasant and cooperative. General Notes: Upon inspection patient's wound bed actually showed signs of good granulation Seshan [name] am actually seeing no need for sharp debridement today which is great news and overall [name] am extremely pleased and with where we stand. No fevers, chills, nausea, vomiting, or diarrhea. Integumentary (Hair, Skin) Wound #2 status is Open. Original cause of wound was Trauma. The date acquired was: 08/11/2022. The wound has been in treatment 4 weeks. The wound is located on the Left,Distal Upper Leg. The wound measures 7cm length x 8cm width x 0.2cm depth; 43.982cm^2 area and 8.796cm^3 volume. There is Fat Layer (Subcutaneous Tissue) exposed. There is no undermining noted, however, there is tunneling at 11:00 with a maximum distance of 1cm. There is additional tunneling and at 12:00 with a maximum distance of 3cm. There is a medium amount of serosanguineous drainage noted. Wound #3 status is Open. Original cause of wound was Trauma. The date acquired was: 08/11/2022. The wound has been in treatment 4 weeks. The wound is located on the Right Knee. The wound measures 4.4cm length x 2.5cm width x 0.1cm depth; 8.639cm^2 area and 0.864cm^3 volume. There is Fat Layer (Subcutaneous Tissue) exposed. There is no tunneling or undermining noted. There is a large  amount of serosanguineous drainage noted. There is small (1-33%) hyper - granulation within the wound bed. There is no necrotic tissue within the wound bed. 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 Nurse Visit as needed - nurse visit once weekly [name], [name] ([id number]) [id number] Page 7 of 8 Anesthetic (Use 'Patient Medications' Section for Anesthetic Order Entry): Lidocaine applied to wound bed Edema Control - Lymphedema / Segmental Compressive Device / Other: Elevate, Exercise Daily and Avoid Standing for Long Periods of Time. Elevate legs to the level of the heart and pump ankles as often as possible Elevate leg(s) parallel to the floor when sitting. Medications-Please add to medication list.: Topical Antibiotic - apply thin layer of Gentamycin to wound WOUND #2: - Upper Leg Wound Laterality: Left, Distal Peri-Wound Care: Skin Prep 3 x Per Week/30 Days Discharge Instructions: Use skin prep as directed  Topical: Gentamicin 3 x Per Week/30 Days Discharge Instructions: Apply as directed by provider. Prim Dressing: Gauze 3 x Per Week/30 Days ary Discharge Instructions: moistened with Dakins Solution Prim Dressing: Hydrofera Blue Ready Transfer Foam, 2.5x2.5 (in/in) (DME) (Dispense As Written) 3 x Per Week/30 Days ary Discharge Instructions: Apply Hydrofera Blue Ready to wound bed as directed Prim Dressing: Prisma 4.34 (in) 3 x  Per Week/30 Days ary Discharge Instructions: pack dry prisma into tunnel Secondary Dressing: ABD Pad 5x9 (in/in) 3 x Per Week/30 Days Discharge Instructions: Cover with ABD pad Secured With: Medipore T - 26M Medipore H Soft Cloth Surgical T ape ape, 2x2 (in/yd) 3 x Per Week/30 Days WOUND #3: - Knee Wound Laterality: Right Peri-Wound Care: Skin Prep 3 x Per Week/30 Days Discharge Instructions: Use skin prep as directed Topical: Gentamicin 3 x Per Week/30 Days Discharge Instructions: Apply as directed by provider. Prim Dressing: Hydrofera Blue Ready Transfer Foam, 4x5 (in/in) 3 x Per Week/30 Days ary Discharge Instructions: Apply Hydrofera Blue Ready to wound bed as directed Secondary Dressing: (BORDER) Zetuvit Plus SILICONE BORDER Dressing 5x5 (in/in) 3 x Per Week/30 Days Discharge Instructions: Please do not put silicone bordered dressings under wraps. Use non-bordered dressing only. 1. [name] am going to suggest that we have the patient continue with the [facility] after applying the gentamicin cream topically and we could be doing this on both sides and she is doing quite well in this regard. 2. [name] am also can recommend that we have the patient continue with the collagen into the toenails which are very small on the left leg now and she will continue with that as well. 3. Will see her just for nurse visit on Friday after this week if she is doing well she will just do want weekly visits versus the nurse visits changing this at home in the interim. We will see patient back for reevaluation in 1 week here in the clinic. If anything worsens or changes patient will contact our office for additional recommendations. Electronic Signature(s) Signed: 09/24/2022 1:28:48 PM By: [name] PA-C Entered By: [name] on 09/24/2022 13:28:48 -------------------------------------------------------------------------------- SuperBill Details Patient Name: Date of Service: Kristine Mater  [name]. 09/24/2022 Medical Record Number: [id number] Patient Account Number: [id number] Date of Birth/Sex: Treating RN: 1955-12-24 (66 y.o. [name] Primary Care Provider: Jon Billings Other Clinician: Massie Kluver Referring Provider: Treating Provider/Extender: Venia Minks in Treatment: 4 Diagnosis Coding ICD-10 Codes Code Description [phone] Bitten by dog, initial encounter L03.116 Cellulitis of left lower limb S80.12XA Contusion of left lower leg, initial encounter T79.2XXA Traumatic secondary and recurrent hemorrhage and seroma, initial encounter [name], [name] ([id number]) [id number] Page 8 of 8 [phone] Abrasion, left lower leg, initial encounter [phone] 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.3 Other dietary vitamin B12 deficiency anemia M32.8 Other forms of systemic lupus erythematosus Facility Procedures : CPT4 Code: [id number] Description: 72620 - WOUND CARE VISIT-LEV 3 EST PT Modifier: Quantity: 1 Physician Procedures : CPT4 Code Description Modifier [id number] - WC PHYS LEVEL 3 - EST PT ICD-10 Diagnosis Description W54.0XXA Bitten by dog, initial encounter L03.116 Cellulitis of left lower limb S80.12XA Contusion of left lower leg, initial encounter T79.2XXA  Traumatic secondary and recurrent hemorrhage and seroma, initial encounter Quantity: 1 Electronic Signature(s) Signed: 09/24/2022 1:29:28 PM By: [name] PA-C Entered By: Joaquim Lai  IIIMargarita Grizzle on 09/24/2022 13:29:27

## 2022-09-24 NOTE — Progress Notes (Signed)
[name], [name] ([id number]) [id number] Page 1 of 5 Visit Report for 09/21/2022 Arrival Information Details Patient Name: Date of Service: [name]. 09/21/2022 11:15 A M Medical Record Number: [id number] Patient Account Number: [id number] Date of Birth/Sex: Treating RN: Mar 16, 1956 (66 y.o. [name] Primary Care Sevanna Ballengee: Jon Billings Other Clinician: Massie Kluver Referring Berthel Bagnall: Treating Angelyna Henderson/Extender: Venia Minks in Treatment: 4 Visit Information History Since Last Visit All ordered tests and consults were completed: No Patient Arrived: Ambulatory Added or deleted any medications: No Arrival Time: 11:14 Any new allergies or adverse reactions: No Transfer Assistance: None Had a fall or experienced change in No Patient Identification Verified: Yes activities of daily living that may affect Secondary Verification Process Completed: Yes risk of falls: Patient Requires Transmission-Based Precautions: No Signs or symptoms of abuse/neglect since last visito No Patient Has Alerts: Yes Hospitalized since last visit: No Patient Alerts: NOT Diabetic Implantable device outside of the clinic excluding No cellular tissue based products placed in the center since last visit: Has Dressing in Place as Prescribed: Yes Pain Present Now: No Electronic Signature(s) Signed: 09/24/2022 4:50:46 PM By: Massie Kluver Entered By: Massie Kluver on 09/21/2022 11:14:58 -------------------------------------------------------------------------------- Clinic Level of Care Assessment Details Patient Name: Date of Service: [name]. 09/21/2022 11:15 A M Medical Record Number: [id number] Patient Account Number: [id number] Date of Birth/Sex: Treating RN: 03/13/1956 (66 y.o. [name] Primary Care Neaveh Belanger: Jon Billings Other Clinician: Massie Kluver Referring Shamra Bradeen: Treating Kennetha Pearman/Extender:  Venia Minks in Treatment: 4 Clinic Level of Care Assessment Items TOOL 4 Quantity Score '[]'$  - 0 Use when only an EandM is performed on FOLLOW-UP visit ASSESSMENTS - Nursing Assessment / Reassessment X- 1 10 Reassessment of Co-morbidities (includes updates in patient status) X- 1 5 Reassessment of Adherence to Treatment Plan SHAELYNN, DRAGOS [name] ([id number]) [id number] Page 2 of 5 ASSESSMENTS - Wound and Skin A ssessment / Reassessment '[]'$  - 0 Simple Wound Assessment / Reassessment - one wound X- 2 5 Complex Wound Assessment / Reassessment - multiple wounds '[]'$  - 0 Dermatologic / Skin Assessment (not related to wound area) ASSESSMENTS - Focused Assessment '[]'$  - 0 Circumferential Edema Measurements - multi extremities '[]'$  - 0 Nutritional Assessment / Counseling / Intervention '[]'$  - 0 Lower Extremity Assessment (monofilament, tuning fork, pulses) '[]'$  - 0 Peripheral Arterial Disease Assessment (using hand held doppler) ASSESSMENTS - Ostomy and/or Continence Assessment and Care '[]'$  - 0 Incontinence Assessment and Management '[]'$  - 0 Ostomy Care Assessment and Management (repouching, etc.) PROCESS - Coordination of Care X - Simple Patient / Family Education for ongoing care 1 15 '[]'$  - 0 Complex (extensive) Patient / Family Education for ongoing care '[]'$  - 0 Staff obtains Programmer, systems, Records, T Results / Process Orders est '[]'$  - 0 Staff telephones HHA, Nursing Homes / Clarify orders / etc '[]'$  - 0 Routine Transfer to another Facility (non-emergent condition) '[]'$  - 0 Routine Hospital Admission (non-emergent condition) '[]'$  - 0 New Admissions / Biomedical engineer / Ordering NPWT Apligraf, etc. , '[]'$  - 0 Emergency Hospital Admission (emergent condition) X- 1 10 Simple Discharge Coordination '[]'$  - 0 Complex (extensive) Discharge Coordination PROCESS - Special Needs '[]'$  - 0 Pediatric / Minor Patient Management '[]'$  - 0 Isolation Patient  Management '[]'$  - 0 Hearing / Language / Visual special needs '[]'$  - 0 Assessment of Community assistance (transportation, D/C planning, etc.) '[]'$  - 0 Additional assistance / Altered mentation '[]'$  - 0 Support Surface(s) Assessment (bed,  cushion, seat, etc.) INTERVENTIONS - Wound Cleansing / Measurement '[]'$  - 0 Simple Wound Cleansing - one wound X- 2 5 Complex Wound Cleansing - multiple wounds '[]'$  - 0 Wound Imaging (photographs - any number of wounds) '[]'$  - 0 Wound Tracing (instead of photographs) '[]'$  - 0 Simple Wound Measurement - one wound '[]'$  - 0 Complex Wound Measurement - multiple wounds INTERVENTIONS - Wound Dressings '[]'$  - 0 Small Wound Dressing one or multiple wounds X- 2 15 Medium Wound Dressing one or multiple wounds '[]'$  - 0 Large Wound Dressing one or multiple wounds X- 1 5 Application of Medications - topical '[]'$  - 0 Application of Medications - injection INTERVENTIONS - Miscellaneous '[]'$  - 0 External ear exam VIRGILENE, STRYKER [name] ([id number]) [id number] Page 3 of 5 '[]'$  - 0 Specimen Collection (cultures, biopsies, blood, body fluids, etc.) '[]'$  - 0 Specimen(s) / Culture(s) sent or taken to Lab for analysis '[]'$  - 0 Patient Transfer (multiple staff / Harrel Lemon Lift / Similar devices) '[]'$  - 0 Simple Staple / Suture removal (25 or less) '[]'$  - 0 Complex Staple / Suture removal (26 or more) '[]'$  - 0 Hypo / Hyperglycemic Management (close monitor of Blood Glucose) '[]'$  - 0 Ankle / Brachial Index (ABI) - do not check if billed separately '[]'$  - 0 Vital Signs Has the patient been seen at the hospital within the last three years: Yes Total Score: 95 Level Of Care: New/Established - Level 3 Electronic Signature(s) Signed: 09/24/2022 4:50:46 PM By: Massie Kluver Entered By: Massie Kluver on 09/21/2022 11:38:17 -------------------------------------------------------------------------------- Encounter Discharge Information Details Patient Name: Date of  Service: [name]. 09/21/2022 11:15 A M Medical Record Number: [id number] Patient Account Number: [id number] Date of Birth/Sex: Treating RN: [birth year] (66 y.o. [name] Primary Care Zinedine Ellner: Jon Billings Other Clinician: Massie Kluver Referring Marthe Dant: Treating Magalene Mclear/Extender: Venia Minks in Treatment: 4 Encounter Discharge Information Items Discharge Condition: Stable Ambulatory Status: Ambulatory Discharge Destination: Home Transportation: Private Auto Accompanied By: self Schedule Follow-up Appointment: Yes Clinical Summary of Care: Electronic Signature(s) Signed: 09/24/2022 4:50:46 PM By: Massie Kluver Entered By: Massie Kluver on 09/21/2022 11:37:32 Wound Assessment Details -------------------------------------------------------------------------------- [name] ([id number]) [id number] Page 4 of 5 Patient Name: Date of Service: [name]. 09/21/2022 11:15 A M Medical Record Number: [id number] Patient Account Number: [id number] Date of Birth/Sex: Treating RN: 1956/05/22 (66 y.o. [name], [name] Primary Care Jyaire Koudelka: Jon Billings Other Clinician: Massie Kluver Referring [place] Winslow: Treating Shemekia Patane/Extender: Venia Minks in Treatment: 4 Wound Status Wound Number: 2 Primary Trauma, Other Etiology: Wound Location: Left, Distal Upper Leg Wound Open Wounding Event: Trauma Status: Date Acquired: 08/11/2022 Comorbid Anemia, Hypertension, Lupus Erythematosus, Osteoarthritis, Weeks Of Treatment: 4 History: Received Radiation Clustered Wound: No Wound Measurements Length: (cm) 7.8 % Reduction in Area: 40% Width: (cm) 9.7 % Reduction in Volume: 40% Depth: (cm) 5 Area: (cm) 59.423 Volume: (cm) 297.116 Wound Description Classification: Full Thickness With Exposed Support Structures Foul Odor After Cleansing: No Exudate Amount:  Medium Exudate Type: Serosanguineous Exudate Color: red, brown Wound Bed Exposed Structure Fat Layer (Subcutaneous Tissue) Exposed: Yes Electronic Signature(s) Signed: 09/21/2022 2:41:51 PM By: Gretta Cool, BSN, RN, CWS, [name] RN, BSN Signed: 09/24/2022 4:50:46 PM By: Massie Kluver Entered By: Massie Kluver on 09/21/2022 11:15:21 -------------------------------------------------------------------------------- Wound Assessment Details Patient Name: Date of Service: [name]. 09/21/2022 11:15 A M Medical Record Number: [id number] Patient Account Number: [id number] Date of Birth/Sex: Treating RN: 05-11-1956 (66 y.o. [name], Maudie Mercury  Primary Care Teyonna Plaisted: Jon Billings Other Clinician: Massie Kluver Referring Albert Devaul: Treating Lorice Lafave/Extender: Venia Minks in Treatment: 4 Wound Status Wound Number: 3 Primary Trauma, Other Etiology: Wound Location: Right Knee Wound Open Wounding Event: Trauma Status: Date Acquired: 08/11/2022 Comorbid Anemia, Hypertension, Lupus Erythematosus, Osteoarthritis, Weeks Of Treatment: 4 History: Received Radiation Clustered Wound: No Wound Measurements Length: (cm) 3 Width: (cm) 5.5 Depth: (cm) 0.3 Area: (cm) 12.959 YANEISY, WENZ [name] ([id number]) Volume: (cm) 3.888 % Reduction in Area: -10% % Reduction in Volume: 80.6% [id number] Page 5 of 5 Wound Description Classification: Full Thickness Without Exposed Support Exudate Amount: Large Exudate Type: Serosanguineous Exudate Color: red, brown Structures Wound Bed Exposed Structure Fat Layer (Subcutaneous Tissue) Exposed: Yes Electronic Signature(s) Signed: 09/21/2022 2:41:51 PM By: Gretta Cool, BSN, RN, CWS, [name] RN, BSN Signed: 09/24/2022 4:50:46 PM By: Massie Kluver Entered By: Massie Kluver on 09/21/2022 11:15:25

## 2022-09-24 NOTE — Progress Notes (Signed)
Kristine, SALATINO [name] ([id number]) [id number] Page 1 of 2 Visit Report for 09/21/2022 Physician Orders Details Patient Name: Date of Service: [name]. 09/21/2022 11:15 A M Medical Record Number: [id number] Patient Account Number: [id number] Date of Birth/Sex: Treating RN: 1956/08/25 (66 y.o. [name] Primary Care Provider: Jon [name] Other Clinician: Massie Kluver Referring Provider: Treating Provider/Extender: Venia Minks in Treatment: 4 Verbal / Phone Orders: No Diagnosis Coding Follow-up Appointments Return Appointment in 1 week. Nurse Visit as needed - daily unless seen by MD Anesthetic (Use 'Patient Medications' Section for Anesthetic Order Entry) Lidocaine applied to wound bed Edema Control - Lymphedema / Segmental Compressive Device / Other Elevate, Exercise Daily and A void Standing for Long Periods of Time. Elevate legs to the level of the heart and pump ankles as often as possible Elevate leg(s) parallel to the floor when sitting. Medications-Please add to medication list. ntibiotic - apply thin layer of Gentamycin to wound Topical A Wound Treatment Wound #2 - Upper Leg Wound Laterality: Left, Distal Peri-Wound Care: Skin Prep 1 x Per Day/30 Days Discharge Instructions: Use skin prep as directed Topical: Gentamicin 1 x Per Day/30 Days Discharge Instructions: Apply as directed by provider. Prim Dressing: Gauze 1 x Per Day/30 Days ary Discharge Instructions: moistened with Dakins Solution Prim Dressing: Prisma 4.34 (in) 1 x Per Day/30 Days ary Discharge Instructions: pack dry prisma into tunnel Secondary Dressing: ABD Pad 5x9 (in/in) 1 x Per Day/30 Days Discharge Instructions: Cover with ABD pad Secured With: Medipore T - 16M Medipore H Soft Cloth Surgical T ape ape, 2x2 (in/yd) 1 x Per Day/30 Days Wound #3 - Knee Wound Laterality: Right Peri-Wound Care: Skin Prep 3 x Per Week/30  Days Discharge Instructions: Use skin prep as directed Topical: Gentamicin 3 x Per Week/30 Days Discharge Instructions: Apply as directed by provider. Prim Dressing: Hydrofera Blue Ready Transfer Foam, 4x5 (in/in) 3 x Per Week/30 Days ary Discharge Instructions: Apply Hydrofera Blue Ready to wound bed as directed Secondary Dressing: (BORDER) Zetuvit Plus SILICONE BORDER Dressing 5x5 (in/in) 3 x Per Week/30 Days Discharge Instructions: Please do not put silicone bordered dressings under wraps. Use non-bordered dressing only. DEVANEY, SEGERS [name] ([id number]) [id number] Page 2 of 2 Electronic Signature(s) Signed: 09/21/2022 12:28:23 PM By: [name] PA-C Signed: 09/24/2022 4:50:46 PM By: Massie Kluver Entered By: Massie Kluver on 09/21/2022 11:37:10 -------------------------------------------------------------------------------- SuperBill Details Patient Name: Date of Service: [name]. 09/21/2022 Medical Record Number: [id number] Patient Account Number: [id number] Date of Birth/Sex: Treating RN: 17-May-1956 (66 y.o. 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

## 2022-09-25 ENCOUNTER — Ambulatory Visit (INDEPENDENT_AMBULATORY_CARE_PROVIDER_SITE_OTHER): Payer: Medicare Other

## 2022-09-25 DIAGNOSIS — L97823 Non-pressure chronic ulcer of other part of left lower leg with necrosis of muscle: Secondary | ICD-10-CM | POA: Diagnosis not present

## 2022-09-25 DIAGNOSIS — D519 Vitamin B12 deficiency anemia, unspecified: Secondary | ICD-10-CM

## 2022-09-25 DIAGNOSIS — S80812A Abrasion, left lower leg, initial encounter: Secondary | ICD-10-CM | POA: Diagnosis not present

## 2022-09-27 ENCOUNTER — Inpatient Hospital Stay: Payer: Medicare Other | Admitting: Internal Medicine

## 2022-09-27 ENCOUNTER — Inpatient Hospital Stay: Payer: Medicare Other

## 2022-09-28 DIAGNOSIS — L97823 Non-pressure chronic ulcer of other part of left lower leg with necrosis of muscle: Secondary | ICD-10-CM | POA: Diagnosis not present

## 2022-09-28 DIAGNOSIS — D513 Other dietary vitamin B12 deficiency anemia: Secondary | ICD-10-CM | POA: Diagnosis not present

## 2022-09-28 DIAGNOSIS — T792XXA Traumatic secondary and recurrent hemorrhage and seroma, initial encounter: Secondary | ICD-10-CM | POA: Diagnosis not present

## 2022-09-28 DIAGNOSIS — M1990 Unspecified osteoarthritis, unspecified site: Secondary | ICD-10-CM | POA: Diagnosis not present

## 2022-09-28 DIAGNOSIS — L98492 Non-pressure chronic ulcer of skin of other sites with fat layer exposed: Secondary | ICD-10-CM | POA: Diagnosis not present

## 2022-09-28 DIAGNOSIS — S80812A Abrasion, left lower leg, initial encounter: Secondary | ICD-10-CM | POA: Diagnosis not present

## 2022-09-28 DIAGNOSIS — I10 Essential (primary) hypertension: Secondary | ICD-10-CM | POA: Diagnosis not present

## 2022-09-28 DIAGNOSIS — L03116 Cellulitis of left lower limb: Secondary | ICD-10-CM | POA: Diagnosis not present

## 2022-09-28 DIAGNOSIS — M328 Other forms of systemic lupus erythematosus: Secondary | ICD-10-CM | POA: Diagnosis not present

## 2022-09-28 DIAGNOSIS — S8012XA Contusion of left lower leg, initial encounter: Secondary | ICD-10-CM | POA: Diagnosis not present

## 2022-09-28 NOTE — Progress Notes (Signed)
TIMMY, CLEVERLY [name] ([id number]) [id number] Page 1 of 6 Visit Report for 09/28/2022 Arrival Information Details Patient Name: Date of Service: [name]. 09/28/2022 7:45 A M Medical Record Number: [id number] Patient Account Number: [id number] Date of Birth/Sex: Treating RN: 17-Aug-1956 (66 y.o. [name] Primary Care Liona Wengert: Jon Billings Other Clinician: Referring Heily Carlucci: Treating Tyreak Reagle/Extender: Venia Minks in Treatment: 5 Visit Information History Since Last Visit Added or deleted any medications: No Patient Arrived: Ambulatory Any new allergies or adverse reactions: No Arrival Time: 09:18 Had a fall or experienced change in No Accompanied By: self activities of daily living that may affect Transfer Assistance: None risk of falls: Patient Identification Verified: Yes Hospitalized since last visit: No Secondary Verification Process Completed: Yes Pain Present Now: No Patient Requires Transmission-Based Precautions: No Patient Has Alerts: Yes Patient Alerts: NOT Diabetic Electronic Signature(s) Signed: 09/28/2022 9:19:22 AM By: Rosalio Loud MSN RN CNS WTA Entered By: Rosalio Loud on 09/28/2022 09:19:22 -------------------------------------------------------------------------------- Clinic Level of Care Assessment Details Patient Name: Date of Service: [name]. 09/28/2022 7:45 A M Medical Record Number: [id number] Patient Account Number: [id number] Date of Birth/Sex: Treating RN: 01-Mar-1956 (66 y.o. [name] Primary Care Neev Mcmains: Jon Billings Other Clinician: Referring Trasean Delima: Treating Shamikia Linskey/Extender: Venia Minks in Treatment: 5 Clinic Level of Care Assessment Items TOOL 4 Quantity Score X- 1 0 Use when only an EandM is performed on FOLLOW-UP visit ASSESSMENTS - Nursing Assessment / Reassessment X- 1 10 Reassessment of Co-morbidities  (includes updates in patient status) X- 1 5 Reassessment of Adherence to Treatment Plan ASSESSMENTS - Wound and Skin A ssessment / Reassessment '[]'$  - 0 Simple Wound Assessment / Reassessment - one wound Kristine, STANHOPE [name] ([id number]) [id number] Page 2 of 6 X- 2 5 Complex Wound Assessment / Reassessment - multiple wounds '[]'$  - 0 Dermatologic / Skin Assessment (not related to wound area) ASSESSMENTS - Focused Assessment '[]'$  - 0 Circumferential Edema Measurements - multi extremities '[]'$  - 0 Nutritional Assessment / Counseling / Intervention '[]'$  - 0 Lower Extremity Assessment (monofilament, tuning fork, pulses) '[]'$  - 0 Peripheral Arterial Disease Assessment (using hand held doppler) ASSESSMENTS - Ostomy and/or Continence Assessment and Care '[]'$  - 0 Incontinence Assessment and Management '[]'$  - 0 Ostomy Care Assessment and Management (repouching, etc.) PROCESS - Coordination of Care '[]'$  - 0 Simple Patient / Family Education for ongoing care '[]'$  - 0 Complex (extensive) Patient / Family Education for ongoing care X- 1 10 Staff obtains Programmer, systems, Records, T Results / Process Orders est '[]'$  - 0 Staff telephones HHA, Nursing Homes / Clarify orders / etc '[]'$  - 0 Routine Transfer to another Facility (non-emergent condition) '[]'$  - 0 Routine Hospital Admission (non-emergent condition) '[]'$  - 0 New Admissions / Biomedical engineer / Ordering NPWT Apligraf, etc. , '[]'$  - 0 Emergency Hospital Admission (emergent condition) '[]'$  - 0 Simple Discharge Coordination X- 1 15 Complex (extensive) Discharge Coordination PROCESS - Special Needs '[]'$  - 0 Pediatric / Minor Patient Management '[]'$  - 0 Isolation Patient Management '[]'$  - 0 Hearing / Language / Visual special needs '[]'$  - 0 Assessment of Community assistance (transportation, D/C planning, etc.) '[]'$  - 0 Additional assistance / Altered mentation '[]'$  - 0 Support Surface(s) Assessment (bed, cushion, seat,  etc.) INTERVENTIONS - Wound Cleansing / Measurement '[]'$  - 0 Simple Wound Cleansing - one wound X- 2 5 Complex Wound Cleansing - multiple wounds '[]'$  - 0 Wound Imaging (photographs - any number of wounds) '[]'$  - 0  Wound Tracing (instead of photographs) '[]'$  - 0 Simple Wound Measurement - one wound '[]'$  - 0 Complex Wound Measurement - multiple wounds INTERVENTIONS - Wound Dressings '[]'$  - 0 Small Wound Dressing one or multiple wounds X- 2 15 Medium Wound Dressing one or multiple wounds '[]'$  - 0 Large Wound Dressing one or multiple wounds '[]'$  - 0 Application of Medications - topical '[]'$  - 0 Application of Medications - injection INTERVENTIONS - Miscellaneous '[]'$  - 0 External ear exam '[]'$  - 0 Specimen Collection (cultures, biopsies, blood, body fluids, etc.) '[]'$  - 0 Specimen(s) / Culture(s) sent or taken to Lab for analysis Kristine, ALTEMOSE [name] ([id number]) [id number] Page 3 of 6 '[]'$  - 0 Patient Transfer (multiple staff / Civil Service fast streamer / Similar devices) '[]'$  - 0 Simple Staple / Suture removal (25 or less) '[]'$  - 0 Complex Staple / Suture removal (26 or more) '[]'$  - 0 Hypo / Hyperglycemic Management (close monitor of Blood Glucose) '[]'$  - 0 Ankle / Brachial Index (ABI) - do not check if billed separately '[]'$  - 0 Vital Signs Has the patient been seen at the hospital within the last three years: Yes Total Score: 90 Level Of Care: New/Established - Level 3 Electronic Signature(s) Signed: 09/28/2022 1:42:10 PM By: Rosalio Loud MSN RN CNS WTA Entered By: Rosalio Loud on 09/28/2022 09:23:49 -------------------------------------------------------------------------------- Encounter Discharge Information Details Patient Name: Date of Service: [name]. 09/28/2022 7:45 A M Medical Record Number: [id number] Patient Account Number: [id number] Date of Birth/Sex: Treating RN: 1956/08/19 (66 y.o. [name] Primary Care Kamarri Fischetti: Jon Billings Other  Clinician: Referring Janeen Watson: Treating Mantaj Chamberlin/Extender: Venia Minks in Treatment: 5 Encounter Discharge Information Items Discharge Condition: Stable Ambulatory Status: Ambulatory Discharge Destination: Home Transportation: Private Auto Accompanied By: self Schedule Follow-up Appointment: Yes Clinical Summary of Care: Electronic Signature(s) Signed: 09/28/2022 9:23:44 AM By: Rosalio Loud MSN RN CNS WTA Entered By: Rosalio Loud on 09/28/2022 09:23:43 -------------------------------------------------------------------------------- Wound Assessment Details Patient Name: Date of Service: [name]. 09/28/2022 7:45 A M Medical Record Number: [id number] Patient Account Number: [id number] Date of Birth/Sex: Treating RN: 01/22/1956 (66 y.o. [name] Primary Care Aalivia Mcgraw: Jon Billings Other Clinician: RYLIEE, FIGGE [name] ([id number]) [id number] Page 4 of 6 Referring Benisha Hadaway: Treating Sohana Austell/Extender: Venia Minks in Treatment: 5 Wound Status Wound Number: 2 Primary Trauma, Other Etiology: Wound Location: Left, Distal Upper Leg Wound Open Wounding Event: Trauma Status: Date Acquired: 08/11/2022 Comorbid Anemia, Hypertension, Lupus Erythematosus, Osteoarthritis, Weeks Of Treatment: 5 History: Received Radiation Clustered Wound: No Wound Measurements Length: (cm) 7 Width: (cm) 8 Depth: (cm) 0.2 Area: (cm) 43.982 Volume: (cm) 8.796 % Reduction in Area: 55.6% % Reduction in Volume: 98.2% Wound Description Classification: Full Thickness With Exposed Support Structures Exudate Amount: Medium Exudate Type: Serosanguineous Exudate Color: red, brown Foul Odor After Cleansing: No Wound Bed Exposed Structure Fat Layer (Subcutaneous Tissue) Exposed: Yes Treatment Notes Wound #2 (Upper Leg) Wound Laterality: Left, Distal Cleanser Peri-Wound Care Skin Prep Discharge  Instruction: Use skin prep as directed Topical Gentamicin Discharge Instruction: Apply as directed by Renard Caperton. Primary Dressing Gauze Discharge Instruction: moistened with Dakins Solution Hydrofera Blue Ready Transfer Foam, 2.5x2.5 (in/in) Discharge Instruction: Apply Hydrofera Blue Ready to wound bed as directed Prisma 4.34 (in) Discharge Instruction: pack dry prisma into tunnel Secondary Dressing ABD Pad 5x9 (in/in) Discharge Instruction: Cover with ABD pad Secured With Medipore T - 70M Medipore H Soft Cloth Surgical T ape ape, 2x2 (in/yd) Compression Wrap Compression Stockings Add-Ons Electronic  Signature(s) Signed: 09/28/2022 9:19:54 AM By: Rosalio Loud MSN RN CNS WTA Entered By: Rosalio Loud on 09/28/2022 09:19:53 [name] ([id number]) [id number] Page 5 of 6 -------------------------------------------------------------------------------- Wound Assessment Details Patient Name: Date of Service: [name]. 09/28/2022 7:45 A M Medical Record Number: [id number] Patient Account Number: [id number] Date of Birth/Sex: Treating RN: May 04, 1956 (66 y.o. [name] Primary Care Prima Rayner: Jon Billings Other Clinician: Referring Sona Nations: Treating Mishon Blubaugh/Extender: Venia Minks in Treatment: 5 Wound Status Wound Number: 3 Primary Trauma, Other Etiology: Wound Location: Right Knee Wound Open Wounding Event: Trauma Status: Date Acquired: 08/11/2022 Comorbid Anemia, Hypertension, Lupus Erythematosus, Osteoarthritis, Weeks Of Treatment: 5 History: Received Radiation Clustered Wound: No Wound Measurements Length: (cm) 4.4 Width: (cm) 2.5 Depth: (cm) 0.1 Area: (cm) 8.639 Volume: (cm) 0.864 % Reduction in Area: 26.7% % Reduction in Volume: 95.7% Epithelialization: None Wound Description Classification: Full Thickness Without Exposed Suppor Exudate Amount: Large Exudate Type:  Serosanguineous Exudate Color: red, brown t Structures Foul Odor After Cleansing: No Slough/Fibrino Yes Wound Bed Granulation Amount: Small (1-33%) Exposed Structure Granulation Quality: Hyper-granulation Fat Layer (Subcutaneous Tissue) Exposed: Yes Necrotic Amount: None Present (0%) Treatment Notes Wound #3 (Knee) Wound Laterality: Right Cleanser Peri-Wound Care Skin Prep Discharge Instruction: Use skin prep as directed Topical Gentamicin Discharge Instruction: Apply as directed by Kanika Bungert. Primary Dressing Hydrofera Blue Ready Transfer Foam, 4x5 (in/in) Discharge Instruction: Apply Hydrofera Blue Ready to wound bed as directed Secondary Dressing (BORDER) Zetuvit Plus SILICONE BORDER Dressing 5x5 (in/in) Discharge Instruction: Please do not put silicone bordered dressings under wraps. Use non-bordered dressing only. Secured With [employer] Compression Stockings Kristine, RAUDA [name] ([id number]) [id number] Page 6 of 6 Add-Ons Electronic Signature(s) Signed: 09/28/2022 9:19:58 AM By: Rosalio Loud MSN RN CNS WTA Entered By: Rosalio Loud on 09/28/2022 09:19:58

## 2022-09-29 NOTE — Progress Notes (Signed)
[name], [name] ([id number]) [id number] Page 1 of 2 Visit Report for 09/28/2022 Physician Orders Details Patient Name: Date of Service: [name]. 09/28/2022 7:45 A M Medical Record Number: [id number] Patient Account Number: [id number] Date of Birth/Sex: Treating RN: July 20, 1956 (66 y.o. [name] Primary Care Provider: Jon Billings Other Clinician: Referring Provider: Treating Provider/Extender: Venia Minks in Treatment: 5 Verbal / Phone Orders: No Diagnosis Coding Follow-up Appointments Return Appointment in 1 week. Nurse Visit as needed - nurse visit once weekly Anesthetic (Use 'Patient Medications' Section for Anesthetic Order Entry) Lidocaine applied to wound bed Edema Control - Lymphedema / Segmental Compressive Device / Other Elevate, Exercise Daily and A void Standing for Long Periods of Time. Elevate legs to the level of the heart and pump ankles as often as possible Elevate leg(s) parallel to the floor when sitting. Medications-Please add to medication list. ntibiotic - apply thin layer of Gentamycin to wound Topical A Wound Treatment Wound #2 - Upper Leg Wound Laterality: Left, Distal Peri-Wound Care: Skin Prep 3 x Per Week/30 Days Discharge Instructions: Use skin prep as directed Topical: Gentamicin 3 x Per Week/30 Days Discharge Instructions: Apply as directed by provider. Prim Dressing: Gauze 3 x Per Week/30 Days ary Discharge Instructions: moistened with Dakins Solution Prim Dressing: Hydrofera Blue Ready Transfer Foam, 2.5x2.5 (in/in) (Dispense As Written) 3 x Per Week/30 Days ary Discharge Instructions: Apply Hydrofera Blue Ready to wound bed as directed Prim Dressing: Prisma 4.34 (in) 3 x Per Week/30 Days ary Discharge Instructions: pack dry prisma into tunnel Secondary Dressing: ABD Pad 5x9 (in/in) 3 x Per Week/30 Days Discharge Instructions: Cover with ABD pad Secured With:  Medipore T - 3M Medipore H Soft Cloth Surgical T ape ape, 2x2 (in/yd) 3 x Per Week/30 Days Wound #3 - Knee Wound Laterality: Right Peri-Wound Care: Skin Prep 3 x Per Week/30 Days Discharge Instructions: Use skin prep as directed Topical: Gentamicin 3 x Per Week/30 Days Discharge Instructions: Apply as directed by provider. Prim Dressing: Hydrofera Blue Ready Transfer Foam, 4x5 (in/in) 3 x Per Week/30 Days ary Discharge Instructions: Apply Hydrofera Blue Ready to wound bed as directed Secondary Dressing: (BORDER) Zetuvit Plus SILICONE BORDER Dressing 5x5 (in/in) 3 x Per Week/30 Days Discharge Instructions: Please do not put silicone bordered dressings under wraps. Use non-bordered dressing only. [name], [name] ([id number]) [id number] Page 2 of 2 Electronic Signature(s) Signed: 09/28/2022 9:21:14 AM By: Rosalio Loud MSN RN CNS WTA Signed: 09/28/2022 12:49:38 PM By: [name] PA-C Entered By: Rosalio Loud on 09/28/2022 09:21:14 -------------------------------------------------------------------------------- SuperBill Details Patient Name: Date of Service: [name]. 09/28/2022 Medical Record Number: [id number] Patient Account Number: [id number] Date of Birth/Sex: Treating RN: 21-Nov-1955 (66 y.o. 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

## 2022-10-01 ENCOUNTER — Other Ambulatory Visit: Payer: Self-pay | Admitting: *Deleted

## 2022-10-01 ENCOUNTER — Encounter: Payer: Medicare Other | Admitting: Physician Assistant

## 2022-10-01 DIAGNOSIS — S80812A Abrasion, left lower leg, initial encounter: Secondary | ICD-10-CM | POA: Diagnosis not present

## 2022-10-01 DIAGNOSIS — D649 Anemia, unspecified: Secondary | ICD-10-CM

## 2022-10-01 DIAGNOSIS — D513 Other dietary vitamin B12 deficiency anemia: Secondary | ICD-10-CM | POA: Diagnosis not present

## 2022-10-01 DIAGNOSIS — L97823 Non-pressure chronic ulcer of other part of left lower leg with necrosis of muscle: Secondary | ICD-10-CM | POA: Diagnosis not present

## 2022-10-01 DIAGNOSIS — L97812 Non-pressure chronic ulcer of other part of right lower leg with fat layer exposed: Secondary | ICD-10-CM | POA: Diagnosis not present

## 2022-10-01 DIAGNOSIS — M328 Other forms of systemic lupus erythematosus: Secondary | ICD-10-CM | POA: Diagnosis not present

## 2022-10-01 DIAGNOSIS — S8012XA Contusion of left lower leg, initial encounter: Secondary | ICD-10-CM | POA: Diagnosis not present

## 2022-10-01 DIAGNOSIS — L97122 Non-pressure chronic ulcer of left thigh with fat layer exposed: Secondary | ICD-10-CM | POA: Diagnosis not present

## 2022-10-01 DIAGNOSIS — I10 Essential (primary) hypertension: Secondary | ICD-10-CM | POA: Diagnosis not present

## 2022-10-01 DIAGNOSIS — M1990 Unspecified osteoarthritis, unspecified site: Secondary | ICD-10-CM | POA: Diagnosis not present

## 2022-10-01 DIAGNOSIS — L03116 Cellulitis of left lower limb: Secondary | ICD-10-CM | POA: Diagnosis not present

## 2022-10-01 DIAGNOSIS — T792XXA Traumatic secondary and recurrent hemorrhage and seroma, initial encounter: Secondary | ICD-10-CM | POA: Diagnosis not present

## 2022-10-01 DIAGNOSIS — L98492 Non-pressure chronic ulcer of skin of other sites with fat layer exposed: Secondary | ICD-10-CM | POA: Diagnosis not present

## 2022-10-01 NOTE — Progress Notes (Addendum)
LASHAUNDA, SCHILD I ([id number]) [id number] Page 1 of 10 Visit Report for 10/01/2022 Arrival Information Details Patient Name: Date of Service: [name] 10/01/2022 3:45 PM Medical Record Number: [id number] Patient Account Number: [id number] Date of Birth/Sex: Treating RN: 02-12-1956 (66 y.o. Female) Carlene Coria Primary Care Marshall Kampf: Jon Billings Other Clinician: Massie Kluver Referring Sandee Bernath: Treating Laquonda Welby/Extender: Venia Minks in Treatment: 5 Visit Information History Since Last Visit All ordered tests and consults were completed: No Patient Arrived: Ambulatory Added or deleted any medications: No Arrival Time: 16:17 Any new allergies or adverse reactions: No Transfer Assistance: None Had a fall or experienced change in No Patient Identification Verified: Yes activities of daily living that may affect Secondary Verification Process Completed: Yes risk of falls: Patient Requires Transmission-Based Precautions: No Signs or symptoms of abuse/neglect since last visito No Patient Has Alerts: Yes Hospitalized since last visit: No Patient Alerts: NOT Diabetic Implantable device outside of the clinic excluding No cellular tissue based products placed in the center since last visit: Has Dressing in Place as Prescribed: Yes Pain Present Now: No Electronic Signature(s) Signed: 10/09/2022 4:45:28 PM By: Massie Kluver Entered By: Massie Kluver on 10/01/2022 16:22:20 -------------------------------------------------------------------------------- Clinic Level of Care Assessment Details Patient Name: Date of Service: [name] 10/01/2022 3:45 PM Medical Record Number: [id number] Patient Account Number: [id number] Date of Birth/Sex: Treating RN: [birth year] (66 y.o. Female) Carlene Coria Primary Care Alexx Giambra: Jon Billings Other Clinician: Massie Kluver Referring Jenai Scaletta: Treating  Mirage Pfefferkorn/Extender: Venia Minks in Treatment: 5 Clinic Level of Care Assessment Items TOOL 4 Quantity Score '[]'$  - 0 Use when only an EandM is performed on FOLLOW-UP visit ASSESSMENTS - Nursing Assessment / Reassessment X- 1 10 Reassessment of Co-morbidities (includes updates in patient status) X- 1 5 Reassessment of Adherence to Treatment Plan VALLORIE, NICCOLI I ([id number]) [id number] Page 2 of 10 ASSESSMENTS - Wound and Skin A ssessment / Reassessment '[]'$  - 0 Simple Wound Assessment / Reassessment - one wound X- 2 5 Complex Wound Assessment / Reassessment - multiple wounds '[]'$  - 0 Dermatologic / Skin Assessment (not related to wound area) ASSESSMENTS - Focused Assessment '[]'$  - 0 Circumferential Edema Measurements - multi extremities '[]'$  - 0 Nutritional Assessment / Counseling / Intervention '[]'$  - 0 Lower Extremity Assessment (monofilament, tuning fork, pulses) '[]'$  - 0 Peripheral Arterial Disease Assessment (using hand held doppler) ASSESSMENTS - Ostomy and/or Continence Assessment and Care '[]'$  - 0 Incontinence Assessment and Management '[]'$  - 0 Ostomy Care Assessment and Management (repouching, etc.) PROCESS - Coordination of Care X - Simple Patient / Family Education for ongoing care 1 15 '[]'$  - 0 Complex (extensive) Patient / Family Education for ongoing care '[]'$  - 0 Staff obtains Programmer, systems, Records, T Results / Process Orders est '[]'$  - 0 Staff telephones HHA, Nursing Homes / Clarify orders / etc '[]'$  - 0 Routine Transfer to another Facility (non-emergent condition) '[]'$  - 0 Routine Hospital Admission (non-emergent condition) '[]'$  - 0 New Admissions / Biomedical engineer / Ordering NPWT Apligraf, etc. , '[]'$  - 0 Emergency Hospital Admission (emergent condition) X- 1 10 Simple Discharge Coordination '[]'$  - 0 Complex (extensive) Discharge Coordination PROCESS - Special Needs '[]'$  - 0 Pediatric / Minor Patient Management '[]'$  -  0 Isolation Patient Management '[]'$  - 0 Hearing / Language / Visual special needs '[]'$  - 0 Assessment of Community assistance (transportation, D/C planning, etc.) '[]'$  - 0 Additional assistance / Altered mentation '[]'$  - 0 Support Surface(s) Assessment (bed, cushion, seat,  etc.) INTERVENTIONS - Wound Cleansing / Measurement '[]'$  - 0 Simple Wound Cleansing - one wound X- 2 5 Complex Wound Cleansing - multiple wounds X- 1 5 Wound Imaging (photographs - any number of wounds) '[]'$  - 0 Wound Tracing (instead of photographs) '[]'$  - 0 Simple Wound Measurement - one wound X- 2 5 Complex Wound Measurement - multiple wounds INTERVENTIONS - Wound Dressings '[]'$  - 0 Small Wound Dressing one or multiple wounds X- 2 15 Medium Wound Dressing one or multiple wounds '[]'$  - 0 Large Wound Dressing one or multiple wounds X- 1 5 Application of Medications - topical '[]'$  - 0 Application of Medications - injection INTERVENTIONS - Miscellaneous '[]'$  - 0 External ear exam MAKENLEY, SHIMP I ([id number]) [id number] Page 3 of 10 '[]'$  - 0 Specimen Collection (cultures, biopsies, blood, body fluids, etc.) '[]'$  - 0 Specimen(s) / Culture(s) sent or taken to Lab for analysis '[]'$  - 0 Patient Transfer (multiple staff / Harrel Lemon Lift / Similar devices) '[]'$  - 0 Simple Staple / Suture removal (25 or less) '[]'$  - 0 Complex Staple / Suture removal (26 or more) '[]'$  - 0 Hypo / Hyperglycemic Management (close monitor of Blood Glucose) '[]'$  - 0 Ankle / Brachial Index (ABI) - do not check if billed separately X- 1 5 Vital Signs Has the patient been seen at the hospital within the last three years: Yes Total Score: 115 Level Of Care: New/Established - Level 3 Electronic Signature(s) Signed: 10/09/2022 4:45:28 PM By: Massie Kluver Entered By: Massie Kluver on 10/01/2022 16:44:18 -------------------------------------------------------------------------------- Encounter Discharge Information Details Patient  Name: Date of Service: [name] 10/01/2022 3:45 PM Medical Record Number: [id number] Patient Account Number: [id number] Date of Birth/Sex: Treating RN: [birth year] (66 y.o. Female) Carlene Coria Primary Care Lilith Solana: Jon Billings Other Clinician: Massie Kluver Referring Mohmmad Saleeby: Treating Sargun Rummell/Extender: Venia Minks in Treatment: 5 Encounter Discharge Information Items Discharge Condition: Stable Ambulatory Status: Ambulatory Discharge Destination: Home Transportation: Private Auto Accompanied By: self Schedule Follow-up Appointment: Yes Clinical Summary of Care: Electronic Signature(s) Signed: 10/09/2022 4:45:28 PM By: Massie Kluver Entered By: Massie Kluver on 10/01/2022 16:52:22 Lower Extremity Assessment Details -------------------------------------------------------------------------------- Allayne Stack I ([id number]) [id number] Page 4 of 10 Patient Name: Date of Service: [name] 10/01/2022 3:45 PM Medical Record Number: [id number] Patient Account Number: [id number] Date of Birth/Sex: Treating RN: 09-08-1956 (66 y.o. Female) Carlene Coria Primary Care Jinnifer Montejano: Jon Billings Other Clinician: Massie Kluver Referring Jaun Galluzzo: Treating Aveah Castell/Extender: Venia Minks in Treatment: 5 Electronic Signature(s) Signed: 10/02/2022 4:34:43 PM By: Carlene Coria RN Signed: 10/09/2022 4:45:28 PM By: Massie Kluver Entered By: Massie Kluver on 10/01/2022 16:36:43 -------------------------------------------------------------------------------- Multi Wound Chart Details Patient Name: Date of Service: [name] 10/01/2022 3:45 PM Medical Record Number: [id number] Patient Account Number: [id number] Date of Birth/Sex: Treating RN: 10-04-1956 (66 y.o. Female) Carlene Coria Primary Care Kinzly Pierrelouis: Jon Billings Other Clinician: Massie Kluver Referring  Shateka Petrea: Treating Lynlee Stratton/Extender: Venia Minks in Treatment: 5 Vital Signs Height(in): Pulse(bpm): 11 Weight(lbs): Blood Pressure(mmHg): 150/77 Body Mass Index(BMI): Temperature(F): 98.3 Respiratory Rate(breaths/min): 16 [2:Photos:] [N/A:N/A] Left, Distal Upper Leg Right Knee N/A Wound Location: Trauma Trauma N/A Wounding Event: Trauma, Other Trauma, Other N/A Primary Etiology: Anemia, Hypertension, Lupus Anemia, Hypertension, Lupus N/A Comorbid History: Erythematosus, Osteoarthritis, Erythematosus, Osteoarthritis, Received Radiation Received Radiation 08/11/2022 08/11/2022 N/A Date Acquired: 5 5 N/A Weeks of Treatment: Open Open N/A Wound Status: No No N/A Wound Recurrence: 5.5x7x0.2 1.8x3.1x0.1 N/A Measurements L x W x D (  cm) 30.238 4.383 N/A A (cm) : rea 6.048 0.438 N/A Volume (cm) : 69.40% 62.80% N/A % Reduction in A rea: 98.80% 97.80% N/A % Reduction in Volume: 10 Position 1 (o'clock): 0.7 Maximum Distance 1 (cm): 12 Position 2 (o'clock): 3.2 Maximum Distance 2 (cm): Yes No N/A Tunneling: Full Thickness With Exposed Support Full Thickness Without Exposed N/A Classification: Structures Support Structures Medium Large N/A Exudate Amount: Serosanguineous Serosanguineous N/A Exudate TypeRACHNA, SCHONBERGER I ([id number]) [id number] Page 5 of 10 red, brown red, brown N/A Exudate Color: N/A Small (1-33%) N/A Granulation A mount: N/A None Present (0%) N/A Necrotic A mount: Fat Layer (Subcutaneous Tissue): Yes Fat Layer (Subcutaneous Tissue): Yes N/A Exposed Structures: Small (1-33%) None N/A Epithelialization: Treatment Notes Electronic Signature(s) Signed: 10/09/2022 4:45:28 PM By: Massie Kluver Entered By: Massie Kluver on 10/01/2022 16:36:48 -------------------------------------------------------------------------------- [place] Details Patient Name: Date of  Service: [name] 10/01/2022 3:45 PM Medical Record Number: [id number] Patient Account Number: [id number] Date of Birth/Sex: Treating RN: 12-30-1955 (67 y.o. Female) Carlene Coria Primary Care Mat Stuard: Jon Billings Other Clinician: Massie Kluver Referring Wei Newbrough: Treating Adriene Padula/Extender: Venia Minks in Treatment: 5 Active Inactive Abuse / Safety / Falls / Self Care Management Nursing Diagnoses: History of Falls Goals: Patient will remain injury free related to falls Date Initiated: 08/24/2022 Target Resolution Date: 08/24/2022 Goal Status: Active Patient/caregiver will verbalize understanding of skin care regimen Date Initiated: 08/24/2022 Target Resolution Date: 08/24/2022 Goal Status: Active Interventions: Assess fall risk on admission and as needed Notes: Orientation to the Wound Care Program Nursing Diagnoses: Knowledge deficit related to the wound healing center program Goals: Patient/caregiver will verbalize understanding of the [place] Date Initiated: 08/24/2022 Target Resolution Date: 08/24/2022 Goal Status: Active Interventions: Provide education on orientation to the wound center Notes: Wound/Skin Impairment Nursing Diagnoses: Impaired tissue integrity JAYLIE, NEAVES I ([id number]) [id number] Page 6 of 10 Knowledge deficit related to ulceration/compromised skin integrity Goals: Patient/caregiver will verbalize understanding of skin care regimen Date Initiated: 08/24/2022 Target Resolution Date: 08/24/2022 Goal Status: Active Ulcer/skin breakdown will have a volume reduction of 30% by week 4 Date Initiated: 08/24/2022 Target Resolution Date: 09/21/2022 Goal Status: Active Interventions: Assess patient/caregiver ability to obtain necessary supplies Assess ulceration(s) every visit Treatment Activities: Referred to DME Roshon Duell for dressing supplies : 08/24/2022 Skin  care regimen initiated : 08/24/2022 Topical wound management initiated : 08/24/2022 Notes: Electronic Signature(s) Signed: 10/02/2022 4:34:43 PM By: Carlene Coria RN Signed: 10/09/2022 4:45:28 PM By: Massie Kluver Entered By: Massie Kluver on 10/01/2022 16:51:47 -------------------------------------------------------------------------------- Pain Assessment Details Patient Name: Date of Service: [name] 10/01/2022 3:45 PM Medical Record Number: [id number] Patient Account Number: [id number] Date of Birth/Sex: Treating RN: 09-15-1956 (66 y.o. Female) Carlene Coria Primary Care Shevelle Smither: Jon Billings Other Clinician: Massie Kluver Referring Shalyn Koral: Treating Ovie Cornelio/Extender: Venia Minks in Treatment: 5 Active Problems Location of Pain Severity and Description of Pain Patient Has Paino No Site Locations Pain Management and Medication Current Pain Management: ZEPHYRA, BERNARDI I ([id number]) [id number] Page 7 of 10 Electronic Signature(s) Signed: 10/02/2022 4:34:43 PM By: Carlene Coria RN Signed: 10/09/2022 4:45:28 PM By: Massie Kluver Entered By: Massie Kluver on 10/01/2022 16:25:20 -------------------------------------------------------------------------------- Patient/Caregiver Education Details Patient Name: Date of Service: [name] 12/11/2023andnbsp3:45 PM Medical Record Number: [id number] Patient Account Number: [id number] Date of Birth/Gender: Treating RN: December 06, 1955 (66 y.o. Female) Carlene Coria Primary Care Physician: Jon Billings Other Clinician: Massie Kluver Referring Physician: Treating Physician/Extender: Jeri Cos  Jon Billings Weeks in Treatment: 5 Education Assessment Education Provided To: Patient Education Topics Provided Wound/Skin Impairment: Handouts: Other: continue wound care as directed Methods: Explain/Verbal Responses: State content  correctly Electronic Signature(s) Signed: 10/09/2022 4:45:28 PM By: Massie Kluver Entered By: Massie Kluver on 10/01/2022 16:51:38 -------------------------------------------------------------------------------- Wound Assessment Details Patient Name: Date of Service: [name] 10/01/2022 3:45 PM Medical Record Number: [id number] Patient Account Number: [id number] Date of Birth/Sex: Treating RN: [birth year] (66 y.o. Female) Carlene Coria Primary Care Bettyjo Lundblad: Jon Billings Other Clinician: Massie Kluver Referring Allayah Raineri: Treating Jonta Gastineau/Extender: Venia Minks in Treatment: 5 Wound Status Wound Number: 2 Primary Trauma, Other Etiology: Wound Location: Left, Distal Upper Leg Wound Open Wounding Event: Trauma Status: Date Acquired: 08/11/2022 Comorbid Anemia, Hypertension, Lupus Erythematosus, Osteoarthritis, Weeks Of Treatment: 5 History: Received Radiation KINDA, POTTLE I ([id number]) [id number] Page 8 of 10 History: Received Radiation Clustered Wound: No Photos Wound Measurements Length: (cm) 5.5 Width: (cm) 7 Depth: (cm) 0.2 Area: (cm) 30.238 Volume: (cm) 6.048 % Reduction in Area: 69.4% % Reduction in Volume: 98.8% Epithelialization: Small (1-33%) Tunneling: Yes Location 1 Position (o'clock): 10 Maximum Distance: (cm) 0.7 Location 2 Position (o'clock): 12 Maximum Distance: (cm) 3.2 Wound Description Classification: Full Thickness With Exposed Support Structures Exudate Amount: Medium Exudate Type: Serosanguineous Exudate Color: red, brown Foul Odor After Cleansing: No Wound Bed Exposed Structure Fat Layer (Subcutaneous Tissue) Exposed: Yes Electronic Signature(s) Signed: 10/02/2022 4:34:43 PM By: Carlene Coria RN Signed: 10/09/2022 4:45:28 PM By: Massie Kluver Entered By: Massie Kluver on 10/01/2022  16:35:54 -------------------------------------------------------------------------------- Wound Assessment Details Patient Name: Date of Service: [name] 10/01/2022 3:45 PM Medical Record Number: [id number] Patient Account Number: [id number] Date of Birth/Sex: Treating RN: 1956/07/13 (66 y.o. Female) Carlene Coria Primary Care Lemon Sternberg: Jon Billings Other Clinician: Massie Kluver Referring Pierre Cumpton: Treating Terrilee Dudzik/Extender: Venia Minks in Treatment: 5 Wound Status Wound Number: 3 Primary Trauma, Other Etiology: Wound Location: Right Knee Wound Open Wounding Event: Trauma Status: Date Acquired: 08/11/2022 Comorbid Anemia, Hypertension, Lupus Erythematosus, Osteoarthritis, Weeks Of Treatment: 5 History: Received Radiation Clustered Wound: No PHIL, CORTI I ([id number]) [id number] Page 9 of 10 Photos Wound Measurements Length: (cm) 1.8 Width: (cm) 3.1 Depth: (cm) 0.1 Area: (cm) 4.383 Volume: (cm) 0.438 % Reduction in Area: 62.8% % Reduction in Volume: 97.8% Epithelialization: None Tunneling: No Undermining: No Wound Description Classification: Full Thickness Without Exposed Suppor Exudate Amount: Large Exudate Type: Serosanguineous Exudate Color: red, brown t Structures Foul Odor After Cleansing: No Slough/Fibrino Yes Wound Bed Granulation Amount: Small (1-33%) Exposed Structure Granulation Quality: Hyper-granulation Fat Layer (Subcutaneous Tissue) Exposed: Yes Necrotic Amount: None Present (0%) Electronic Signature(s) Signed: 10/02/2022 4:34:43 PM By: Carlene Coria RN Signed: 10/09/2022 4:45:28 PM By: Massie Kluver Entered By: Massie Kluver on 10/01/2022 16:36:30 -------------------------------------------------------------------------------- Vitals Details Patient Name: Date of Service: [name] 10/01/2022 3:45 PM Medical Record Number: [id number] Patient Account  Number: [id number] Date of Birth/Sex: Treating RN: 1956-06-02 (66 y.o. Female) Carlene Coria Primary Care Ashawn Rinehart: Jon Billings Other Clinician: Massie Kluver Referring Ollin Hochmuth: Treating Deirdra Heumann/Extender: Venia Minks in Treatment: 5 Vital Signs Time Taken: 16:23 Temperature (F): 98.3 Pulse (bpm): 80 Respiratory Rate (breaths/min): 16 Blood Pressure (mmHg): 150/77 Reference Range: 80 - 120 mg / dl Electronic Signature(s) Signed: 10/09/2022 4:45:28 PM By: Massie Kluver Entered By: Massie Kluver on 10/01/2022 16:25:05 Allayne Stack I ([id number]) [id number] Page 10 of 10

## 2022-10-01 NOTE — Telephone Encounter (Signed)
Patient [name] has been approved from 10/22/22 to 10/22/2023

## 2022-10-01 NOTE — Progress Notes (Addendum)
[name], [name] ([id number]) [id number] Page 1 of 8 Visit Report for 10/01/2022 Chief Complaint Document Details Patient Name: Date of Service: [name] 10/01/2022 3:45 PM Medical Record Number: [id number] Patient Account Number: [id number] Date of Birth/Sex: Treating RN: 08-30-1956 (66 y.o. Female) Carlene Coria Primary Care Provider: Jon Billings Other Clinician: Massie Kluver Referring Provider: Treating Provider/Extender: Venia Minks in Treatment: 5 Information Obtained from: Patient Chief Complaint Right knee and left thigh ulcers secondary to a dog bite and fall Electronic Signature(s) Signed: 10/01/2022 4:10:25 PM By: [name] PA-C Entered By: [name] on 10/01/2022 16:10:24 -------------------------------------------------------------------------------- HPI Details Patient Name: Date of Service: [name]. 10/01/2022 3:45 PM Medical Record Number: [id number] Patient Account Number: [id number] Date of Birth/Sex: Treating RN: 13-Jul-1956 (66 y.o. Female) Carlene Coria Primary Care Provider: Jon Billings Other Clinician: Massie Kluver Referring Provider: Treating Provider/Extender: Venia Minks in Treatment: 5 History of Present Illness HPI Description: 08-24-2022 patient presents today for initial evaluation here in our clinic concerning issues that she has been having with wounds in 3 locations. 1 areas in the proximal left thigh this is due to a fall that she sustained on 08-11-2022. Subsequently she has then a second issue in the distal thigh as well as the right knee location. These are due to a unusual situation where she was actually leaving had just been petting her grand dog when she walked to her car. She tells me that she got dizzy and stumbled and fell and when she fell she called out. When she did this subsequently her grand dog ran over  were not sure if that he thought he was playing with her what but nonetheless ended up biting her on the left thigh as well as on the right knee. Here she had 2 areas of laceration which she subsequently went to the ER for and they attempted to suture both closed. Unfortunately that just did not take. At this point [name] think the sutures can need to be removed from both locations and to be honest there is a significant area of necrosis on the left thigh this may need to be cleaned out and away. Fortunately I do not see any signs of systemic infection though locally there is some evidence of infection she has been on Augmentin which just recently switched to doxycycline no cultures been performed at this point. From the standpoint of past medical history the patient does have a history of anemia which they believe is related to B12. She does not have diabetes she does have a history of lupus however. She is a former smoker. 11/7; this is a patient who has a severe dog bite injury on her upper anterior left thigh. Also an area on the right medial knee. We have been seeing her daily and using Dakin's wet-to-dry. [name] was shown yesterday the culture of drainage that was done last week that shows Pseudomonas. She has multiple drug interactions with quinolones therefore [name] [name], [name] ([id number]) [id number] Page 2 of 8 gave her a third-generation cephalosporin. HOWEVER apparently the pharmacy would not fill the prescription because of the concerns about allergic reaction to the capsule itself related to beef products. [name] could not find this anywhere even on Google however she has taken capsule antibiotics in the past and [name] told her [name] think that this risk is small compared to not treating the Pseudomonas. She has not been systemically unwell she is in some  discomfort 09-04-2022 upon evaluation today patient unfortunately he is continuing to have some significant issues here with  her leg although I do feel like she is better than she was last week. Fortunately there does not appear to be any evidence of systemic infection though locally we definitely noted Pseudomonas as the causative agent on the left thigh location with a culture that needed from the deep tissue wound area. Subsequently the only active antibiotics that are going to take care of this are Cipro, Levaquin, and Baxdela. Unfortunately the Cipro and Levaquin cannot be taken by her due to the interaction with Plaquenil causing a QT prolongation which is good to be a significant issue. I do not want that to be a complicating factor and therefore we need to look toward the [place] which [name] understand is definitely expensive but [name] am not sure if there is another option to be perfectly honest. [name] discussed this with the patient today and she is in agreement with plan we have also made a referral to infectious disease though if we can get this treated with Baxdela then she may not even end up requiring the infectious disease referral. Upon inspection patient's wound bed actually showed signs of 09-11-2022 upon evaluation today patient appears to be doing well currently in regard to her wound. She is actually making progress and we actually did find out today that we did get the [place] approved. This is great news as [name] think it will probably prevent her from having to go to the infectious disease doctors will get the infection under control as it stands right now. She is very pleased to hear this and [name] likewise [name] am extremely pleased. It still can be quite pricey it is good to be $350 with her insurance but that is better than close to 2000 without. 09-17-2022 upon evaluation today patient appears to be doing well currently in regard to her wounds. She has been tolerating the dressing changes without complication. Fortunately I do not see any evidence of infection at this point which is great news we still had a  tremendously difficult period of time trying to get the [place] approved and then once we got it approved apparently the pharmacy was unable to actually order the medication. This has been an extremely frustrating process in general. The patient has been extremely frustrated. 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 Nonetheless I do believe that this is still the best way to go with the topical gentamicin. 09-24-2022 upon evaluation today patient appears to be doing well with regard to her wounds she is actually making excellent progress and [name] think we are ready to switch to [facility] for the left leg as well we have already been using this on the right knee with great results. 10-01-2022 upon evaluation today patient actually appears to be doing excellent. She has been tolerating the [facility] and an excellent fashion without complication and overall [name] am extremely pleased with where we stand today. There does not appear to be any signs of active infection locally nor systemically at this time. No fevers, chills, nausea, vomiting, or diarrhea. Electronic Signature(s) Signed: 10/01/2022 5:05:57 PM By: [name] PA-C Entered By: [name] on 10/01/2022 17:05:57 -------------------------------------------------------------------------------- Physical Exam  Details Patient Name: Date of Service: [name] 10/01/2022 3:45 PM Medical Record Number: [id number] Patient Account Number: [id number] Date of Birth/Sex: Treating RN: October 22, 1956 (66 y.o. Female) Carlene Coria Primary Care Provider: Jon Billings Other Clinician: Massie Kluver Referring Provider: Treating Provider/Extender: Venia Minks in Treatment:  5 Constitutional Well-nourished and well-hydrated in no acute distress. Respiratory normal breathing without difficulty. Psychiatric this patient is able to make decisions and demonstrates good insight into disease process. Alert and Oriented x 3. pleasant and cooperative. Notes Patient's wounds are showing signs of excellent improvement. Both are healing quite nicely [name] am extremely pleased with where we stand. I do not see any signs of infection at this point. Electronic Signature(s) Signed: 10/01/2022 5:06:09 PM By: [name] PA-C Entered By: [name] on 10/01/2022 17:06:09 [name], [name] ([id number]) [id number] Page 3 of 8 -------------------------------------------------------------------------------- Physician Orders Details Patient Name: Date of Service: [name] 10/01/2022 3:45 PM Medical Record Number: [id number] Patient Account Number: [id number] Date of Birth/Sex: Treating RN: 1956-05-11 (66 y.o. 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 Nurse Visit as needed - nurse visit once weekly Anesthetic  (Use 'Patient Medications' Section for Anesthetic Order Entry) Lidocaine applied to wound bed Edema Control - Lymphedema / Segmental Compressive Device / Other Elevate, Exercise Daily and A void Standing for Long Periods of Time. Elevate legs to the level of the heart and pump ankles as often as possible Elevate leg(s) parallel to the floor when sitting. Medications-Please add to medication list. ntibiotic - apply thin layer of Gentamycin to wound Topical A Wound Treatment Wound #2 - Upper Leg Wound Laterality: Left, Distal Peri-Wound Care: Skin Prep 3 x Per Week/30 Days Discharge Instructions: Use skin prep as directed Topical: Gentamicin 3 x Per Week/30 Days Discharge Instructions: Apply as directed by provider. Prim Dressing: Gauze 3 x Per Week/30 Days ary Discharge Instructions: moistened with Dakins Solution Prim Dressing: Hydrofera Blue Ready Transfer Foam, 2.5x2.5 (in/in) (Dispense As Written) 3 x Per Week/30 Days ary Discharge Instructions: Apply Hydrofera Blue Ready to wound bed as directed Secondary Dressing: ABD Pad 5x9 (in/in) (DME) (Generic) 3 x Per Week/30 Days Discharge Instructions: Cover with ABD pad Secured With: Medipore T - 12M Medipore H Soft Cloth Surgical T ape ape, 2x2 (in/yd) 3 x Per Week/30 Days Wound #3 - Knee Wound Laterality: Right Peri-Wound Care: Skin Prep 3 x Per Week/30 Days Discharge Instructions: Use skin prep as directed [name], [name] ([id number]) [id number] Page 4 of 8 Topical: Gentamicin 3 x Per Week/30 Days Discharge Instructions: Apply as directed by provider. Prim Dressing: Hydrofera Blue Ready Transfer Foam, 4x5 (in/in) 3 x Per Week/30 Days ary Discharge Instructions: Apply Hydrofera Blue Ready to wound bed as directed Secondary Dressing: (BORDER) Zetuvit Plus SILICONE BORDER Dressing 5x5 (in/in) 3 x  Per Week/30 Days Discharge Instructions: Please do not put silicone bordered dressings under wraps. Use  non-bordered dressing only. Electronic Signature(s) Signed: 10/01/2022 5:07:14 PM By: [name] PA-C Signed: 10/09/2022 4:45:28 PM By: Massie Kluver Entered By: Massie Kluver on 10/01/2022 16:51:16 -------------------------------------------------------------------------------- Problem List Details Patient Name: Date of Service: [name]. 10/01/2022 3:45 PM Medical Record Number: [id number] Patient Account Number: [id number] Date of Birth/Sex: Treating RN: Oct 29, 1955 (66 y.o. Female) Carlene Coria Primary Care Provider: Jon Billings Other Clinician: Massie Kluver Referring Provider: Treating Provider/Extender: Venia Minks in Treatment: 5 Active Problems ICD-10 Encounter Code Description Active Date MDM Diagnosis W54.0XXA Bitten by dog, initial encounter 08/24/2022 No Yes L03.116 Cellulitis of left lower limb 09/04/2022 No Yes S80.12XA Contusion of left lower leg, initial encounter 08/24/2022 No Yes T79.2XXA Traumatic secondary and recurrent hemorrhage and seroma, initial encounter 08/24/2022 No Yes S80.812A Abrasion, left lower leg, initial encounter 08/24/2022 No Yes L97.823 Non-pressure chronic ulcer of other part of left lower leg with necrosis of 08/24/2022 No Yes muscle L98.492 Non-pressure chronic ulcer of skin of other sites with fat layer exposed 08/24/2022 No Yes D51.3 Other dietary vitamin B12 deficiency anemia 08/24/2022 No Yes SHANIKQUA, ZARZYCKI [name] ([id number]) [id number] Page 5 of 8 M32.8 Other forms of systemic lupus erythematosus 08/24/2022 No Yes Inactive Problems Resolved Problems Electronic Signature(s) Signed: 10/01/2022 4:10:22 PM By: [name] PA-C Entered By: [name] on 10/01/2022 16:10:21 -------------------------------------------------------------------------------- Progress Note Details Patient Name: Date of Service: [name]. 10/01/2022 3:45 PM Medical  Record Number: [id number] Patient Account Number: [id number] Date of Birth/Sex: Treating RN: 1956/09/21 (66 y.o. Female) Carlene Coria Primary Care Provider: Jon Billings Other Clinician: Massie Kluver Referring Provider: Treating Provider/Extender: Venia Minks in Treatment: 5 Subjective Chief Complaint Information obtained from Patient Right knee and left thigh ulcers secondary to a dog bite and fall History of Present Illness (HPI) 08-24-2022 patient presents today for initial evaluation here in our clinic concerning issues that she has been having with wounds in 3 locations. 1 areas in the proximal left thigh this is due to a fall that she sustained on 08-11-2022. Subsequently she has then a second issue in the distal thigh as well as the right knee location. These are due to a unusual situation where she was actually leaving had just been petting her grand dog when she walked to her car. She tells me that she got dizzy and stumbled and fell and when she fell she called out. When she did this subsequently her grand dog ran over were not sure if that he thought he was playing with her what but nonetheless ended up biting her on the left thigh as well as on the right knee. Here she had 2 areas of laceration which she subsequently went to the ER for and they attempted to suture both closed. Unfortunately that just did not take. At this point [name] think the sutures can need to be removed from both locations and to be honest there is a significant area of necrosis on the left thigh this may need to be cleaned out and away. Fortunately I do not see any signs of systemic infection though locally there is some evidence of infection she has been on Augmentin which just recently switched to doxycycline no cultures been performed at this point. From the standpoint of past medical history the patient does have a history of anemia which they believe is related to B12. She  does not  have diabetes she does have a history of lupus however. She is a former smoker. 11/7; this is a patient who has a severe dog bite injury on her upper anterior left thigh. Also an area on the right medial knee. We have been seeing her daily and using Dakin's wet-to-dry. [name] was shown yesterday the culture of drainage that was done last week that shows Pseudomonas. She has multiple drug interactions with quinolones therefore [name] gave her a third-generation cephalosporin. HOWEVER apparently the pharmacy would not fill the prescription because of the concerns about allergic reaction to the capsule itself related to beef products. [name] could not find this anywhere even on Google however she has taken capsule antibiotics in the past and [name] told her [name] think that this risk is small compared to not treating the Pseudomonas. She has not been systemically unwell she is in some discomfort 09-04-2022 upon evaluation today patient unfortunately he is continuing to have some significant issues here with her leg although I do feel like she is better than she was last week. Fortunately there does not appear to be any evidence of systemic infection though locally we definitely noted Pseudomonas as the causative agent on the left thigh location with a culture that needed from the deep tissue wound area. Subsequently the only active antibiotics that are going to take care of this are Cipro, Levaquin, and Baxdela. Unfortunately the Cipro and Levaquin cannot be taken by her due to the interaction with Plaquenil causing a QT prolongation which is good to be a significant issue. I do not want that to be a complicating factor and therefore we need to look toward the [place] which [name] understand is definitely expensive but [name] am not sure if there is another option to be perfectly honest. [name] discussed this with the patient today and she is in agreement with plan we have also made a referral to infectious disease though if we can get this  treated with Baxdela then she may not even end up requiring the infectious disease referral. Upon inspection patient's wound bed actually showed signs of 09-11-2022 upon evaluation today patient appears to be doing well currently in regard to her wound. She is actually making progress and we actually did find out today that we did get the [place] approved. This is great news as [name] think it will probably prevent her from having to go to the infectious disease doctors will get the infection under control as it stands right now. She is very pleased to hear this and [name] likewise [name] am extremely pleased. It still can be quite pricey it is good to be $350 with her insurance but that is better than close to 2000 without. 09-17-2022 upon evaluation today patient appears to be doing well currently in regard to her wounds. She has been tolerating the dressing changes without complication. Fortunately I do not see any evidence of infection at this point which is great news we still had a tremendously difficult period of time trying to [name], [name] ([id number]) [id number] Page 6 of 8 get the [place] approved and then once we got it approved apparently the pharmacy was unable to actually order the medication. This has been an extremely frustrating process in general. The patient has been extremely frustrated. Nonetheless she seems to actually be making excellent progress and in fact [name] am not even certain is good to be necessary to use the oral antibiotics at this point which will definitely save her a  lot of money which she is very pleased about and to employees. Nonetheless I do believe that this is still the best way to go with the topical gentamicin. 09-24-2022 upon evaluation today patient appears to be doing well with regard to her wounds she is actually making excellent progress and [name] think we are ready to switch to [facility] for the left leg as well we have already been  using this on the right knee with great results. 10-01-2022 upon evaluation today patient actually appears to be doing excellent. She has been tolerating the [facility] and an excellent fashion without complication and overall [name] am extremely pleased with where we stand today. There does not appear to be any signs of active infection locally nor systemically at this time. No fevers, chills, nausea, vomiting, or diarrhea. Objective Constitutional Well-nourished and well-hydrated in no acute distress. Vitals Time Taken: 4:23 PM, Temperature: 98.3 F, Pulse: 80 bpm, Respiratory Rate: 16 breaths/min, Blood Pressure: 150/77 mmHg. Respiratory normal breathing without difficulty. Psychiatric this patient is able to make decisions and demonstrates good insight into disease process. Alert and Oriented x 3. pleasant and cooperative. General Notes: Patient's wounds are showing signs of excellent improvement. Both are healing quite nicely [name] am extremely pleased with where we stand. I do not see any signs of infection at this point. Integumentary (Hair, Skin) Wound #2 status is Open. Original cause of wound was Trauma. The date acquired was: 08/11/2022. The wound has been in treatment 5 weeks. The wound is located on the Left,Distal Upper Leg. The wound measures 5.5cm length x 7cm width x 0.2cm depth; 30.238cm^2 area and 6.048cm^3 volume. There is Fat Layer (Subcutaneous Tissue) exposed. There is tunneling at 10:00 with a maximum distance of 0.7cm. There is additional tunneling and at 12:00 with a maximum distance of 3.2cm. There is a medium amount of serosanguineous drainage noted. Wound #3 status is Open. Original cause of wound was Trauma. The date acquired was: 08/11/2022. The wound has been in treatment 5 weeks. The wound is located on the Right Knee. The wound measures 1.8cm length x 3.1cm width x 0.1cm depth; 4.383cm^2 area and 0.438cm^3 volume. There is Fat Layer (Subcutaneous Tissue) exposed.  There is no tunneling or undermining noted. There is a large amount of serosanguineous drainage noted. There is small (1-33%) hyper - granulation within the wound bed. There is no necrotic tissue within the wound bed. 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 Nurse Visit as needed - nurse visit once weekly Anesthetic (Use 'Patient Medications' Section for Anesthetic Order Entry): Lidocaine applied to wound bed Edema Control - Lymphedema / Segmental Compressive Device / Other: Elevate, Exercise Daily and Avoid Standing for Long Periods of Time. Elevate legs to the level of the heart and pump ankles as often as possible Elevate leg(s) parallel to the floor when sitting. Medications-Please add to medication list.: Topical Antibiotic - apply thin layer of Gentamycin to wound WOUND #2: - Upper Leg Wound Laterality: Left, Distal Peri-Wound Care: Skin Prep 3 x Per Week/30 Days Discharge Instructions: Use skin  prep as directed [name], [name] ([id number]) [id number] Page 7 of 8 Topical: Gentamicin 3 x Per Week/30 Days Discharge Instructions: Apply as directed by provider. Prim Dressing: Gauze 3 x Per Week/30 Days ary Discharge Instructions: moistened with Dakins Solution Prim Dressing: Hydrofera Blue Ready Transfer Foam, 2.5x2.5 (in/in) (Dispense As Written) 3 x Per Week/30 Days ary Discharge Instructions: Apply Hydrofera Blue Ready to wound  bed as directed Secondary Dressing: ABD Pad 5x9 (in/in) (DME) (Generic) 3 x Per Week/30 Days Discharge Instructions: Cover with ABD pad Secured With: Medipore T - 26M Medipore H Soft Cloth Surgical T ape ape, 2x2 (in/yd) 3 x Per Week/30 Days WOUND #3: - Knee Wound Laterality: Right Peri-Wound Care: Skin Prep 3 x Per Week/30 Days Discharge Instructions: Use skin prep as directed Topical: Gentamicin 3 x Per Week/30 Days Discharge Instructions: Apply as directed by provider. Prim Dressing: Hydrofera Blue Ready Transfer Foam, 4x5 (in/in) 3 x Per Week/30 Days ary Discharge Instructions: Apply Hydrofera Blue Ready to wound bed as directed Secondary Dressing: (BORDER) Zetuvit Plus SILICONE BORDER Dressing 5x5 (in/in) 3 x Per Week/30 Days Discharge Instructions: Please do not put silicone bordered dressings under wraps. Use non-bordered dressing only. 1. [name] am going to recommend that we have the patient continue to monitor for any evidence of infection or worsening. Obviously if anything changes she can contact the office let me know otherwise going to continue with the gentamicin topically and the Hydrofera Blue. 2. [name] am also can recommend we continue with the bordered foam dressings to cover. 3. [name] would also suggest she continue to monitor for any signs of worsening and if anything changes she lets me know otherwise we can go to just 1 time a week visits here in the clinic at this point. We will see patient back for reevaluation in 1 week here in the clinic. If anything worsens or changes patient will contact our office for additional recommendations. Electronic Signature(s) Signed: 10/01/2022 5:06:41 PM By: [name] PA-C Entered By: [name] on 10/01/2022 17:06:41 -------------------------------------------------------------------------------- SuperBill Details Patient Name: Date of Service: [name]. 10/01/2022 Medical Record Number: [id number] Patient Account  Number: [id number] Date of Birth/Sex: Treating RN: 07/13/1956 (66 y.o. 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.3 Other dietary vitamin B12 deficiency anemia M32.8 Other forms of systemic lupus erythematosus Facility Procedures Physician Procedures : CPT4 Code Description Modifier [id number] - WC PHYS LEVEL 3 - EST PT ICD-10 Diagnosis Description W54.0XXA Bitten by dog, initial encounter L03.116 Cellulitis of left lower limb S80.12XA Contusion of left lower leg, initial encounter T79.2XXA  Traumatic secondary and recurrent hemorrhage and seroma, initial encounter Quantity: 1 Electronic Signature(s) Signed: 10/01/2022 5:06:53 PM By: [name] PA-C Entered By: [name] on 10/01/2022 17:06:53

## 2022-10-02 ENCOUNTER — Inpatient Hospital Stay: Payer: Medicare Other

## 2022-10-02 ENCOUNTER — Encounter: Payer: Self-pay | Admitting: Internal Medicine

## 2022-10-02 ENCOUNTER — Inpatient Hospital Stay (HOSPITAL_BASED_OUTPATIENT_CLINIC_OR_DEPARTMENT_OTHER): Payer: Medicare Other | Admitting: Internal Medicine

## 2022-10-02 VITALS — BP 111/71 | HR 77 | Temp 99.0°F | Resp 18 | Wt 181.6 lb

## 2022-10-02 DIAGNOSIS — D649 Anemia, unspecified: Secondary | ICD-10-CM | POA: Diagnosis not present

## 2022-10-02 DIAGNOSIS — E538 Deficiency of other specified B group vitamins: Secondary | ICD-10-CM | POA: Diagnosis not present

## 2022-10-02 DIAGNOSIS — D631 Anemia in chronic kidney disease: Secondary | ICD-10-CM | POA: Diagnosis not present

## 2022-10-02 DIAGNOSIS — N183 Chronic kidney disease, stage 3 unspecified: Secondary | ICD-10-CM | POA: Diagnosis not present

## 2022-10-02 NOTE — Progress Notes (Signed)
[facility] NOTE  Patient Care Team: Jon Billings, NP as PCP - General (Nurse Practitioner) Kem Boroughs, FNP as Nurse Practitioner (Family Medicine) Christene Lye, MD (General Surgery) Emmaline Kluver., MD (Rheumatology) Yisroel Ramming, Everardo All, MD as Consulting Physician (Obstetrics and Gynecology) Cammie Sickle, MD as Consulting Physician (Hematology and Oncology)  CHIEF COMPLAINTS/PURPOSE OF CONSULTATION: Anemia  #chronic  Anemia/CKD-III-IDA  # #B12 deficiency  #  chronic thrombocytosis and monocytosis [since 2012].   myeloproliferative disorder such as CMML- April 2022-bone marrow biopsy-no acute process- likely reactive MPN- work up NEGATIVE.  # CKD-III [Dr.Murphy, nephrology]-lupus versus others- [Dr.Murphy]    Oncology History   No history exists.   HISTORY OF PRESENTING ILLNESS: Alone.  Ambulating independently.  [name] 66 y.o.  female with a history of chronic anemia/chronic mild monocytosis/thrombocytosis is here for follow-up.  Patient received 3 weekly B12 injections at PCP with last one 09/25/22.  Energy level is not improving.  In the interim patient was evaluated in emergency room for dog bite.  S/p infection any antibiotics.  S/p wound care evaluation.   Denies any blood in stools or black or stools. no Nausea vomiting.    Review of Systems  Constitutional:  Positive for malaise/fatigue. Negative for chills, diaphoresis and fever.  HENT:  Negative for nosebleeds and sore throat.   Eyes:  Negative for double vision.  Respiratory:  Negative for cough, hemoptysis, sputum production, shortness of breath and wheezing.   Cardiovascular:  Negative for chest pain, palpitations, orthopnea and leg swelling.  Gastrointestinal:  Negative for abdominal pain, blood in stool, constipation, diarrhea, heartburn, melena, nausea and vomiting.  Genitourinary:  Negative for dysuria, frequency and urgency.   Musculoskeletal:  Positive for back pain and joint pain.  Skin: Negative.  Negative for itching and rash.  Neurological:  Negative for dizziness, tingling, focal weakness, weakness and headaches.  Endo/Heme/Allergies:  Does not bruise/bleed easily.  Psychiatric/Behavioral:  Negative for depression. The patient is not nervous/anxious and does not have insomnia.      MEDICAL HISTORY:  Past Medical History:  Diagnosis Date   Abnormal pap    Anemia    Anxiety    History of lupus 1987   Hypertension    Low vitamin D level    Renal insufficiency 1988   kidney biopsy 1988 and 1998- "my kidneys function at like 48%"    SURGICAL HISTORY: Past Surgical History:  Procedure Laterality Date   BONE MARROW BIOPSY  01/2021   BREAST BIOPSY Right 07/08/2017   BREAST EXCISIONAL BIOPSY Right 08/06/2017   BREAST EXCISIONAL BIOPSY Right 07/1999   BREAST LUMPECTOMY WITH RADIOACTIVE SEED LOCALIZATION Right 08/06/2017   Procedure: RIGHT BREAST LUMPECTOMY WITH RADIOACTIVE SEED LOCALIZATION ERAS PATHWAY;  Surgeon: Excell Seltzer, MD;  Location: [facility];  Service: General;  Laterality: Right;   BREAST LUMPECTOMY WITH RADIOACTIVE SEED LOCALIZATION Right 08/19/2018   Procedure: RIGHT BREAST LUMPECTOMY [place];  Surgeon: Excell Seltzer, MD;  Location: [facility];  Service: General;  Laterality: Right;   BREAST SURGERY  07/1999   right breast benign fibroadenoma   COLONOSCOPY  2012   COLONOSCOPY WITH PROPOFOL N/A 05/01/2017   Procedure: COLONOSCOPY WITH PROPOFOL;  Surgeon: Christene Lye, MD;  Location: [facility] ENDOSCOPY;  Service: Endoscopy;  Laterality: N/A;   COLONOSCOPY WITH PROPOFOL N/A 12/18/2021   Procedure: COLONOSCOPY WITH PROPOFOL;  Surgeon: Lin Landsman, MD;  Location: [facility] ENDOSCOPY;  Service: Gastroenterology;  Laterality: N/A;   CRYOTHERAPY     abnormal pap smear   ESOPHAGOGASTRODUODENOSCOPY (EGD) WITH PROPOFOL N/A  05/01/2017   Procedure: ESOPHAGOGASTRODUODENOSCOPY (EGD) WITH PROPOFOL;  Surgeon: Christene Lye, MD;  Location: [facility] ENDOSCOPY;  Service: Endoscopy;  Laterality: N/A;   ESOPHAGOGASTRODUODENOSCOPY (EGD) WITH PROPOFOL N/A 12/18/2021   Procedure: ESOPHAGOGASTRODUODENOSCOPY (EGD) WITH PROPOFOL;  Surgeon: Lin Landsman, MD;  Location[facility] ENDOSCOPY;  Service: Gastroenterology;  Laterality: N/A;   NASAL SEPTUM SURGERY  2007   TUBAL LIGATION  1990    SOCIAL HISTORY: Social History   Socioeconomic History   Marital status: Widowed    Spouse name: Not on file   Number of children: Not on file   Years of education: Not on file   Highest education level: Not on file  Occupational History   Not on file  Tobacco Use   Smoking status: Never   Smokeless tobacco: Never  Vaping Use   Vaping Use: Never used  Substance and Sexual Activity   Alcohol use: Yes    Alcohol/week: 4.0 - 6.0 standard drinks of alcohol    Types: 4 - 6 Standard drinks or equivalent per week    Comment: few beers on friday and saturday   Drug use: Yes    Types: Marijuana    Comment: few times a month    Sexual activity: Yes    Partners: Male    Birth control/protection: Surgical, Post-menopausal    Comment: BTL  Other Topics Concern   Not on file  Social History Narrative   Husband died 12-Dec-2016 from lung cancer at age 35 yrs old.     Social Determinants of Health   Financial Resource Strain: Low Risk  (11/06/2021)   Overall Financial Resource Strain (CARDIA)    Difficulty of Paying Living Expenses: Not very hard  Food Insecurity: No Food Insecurity (08/12/2022)   Hunger Vital Sign    Worried About Running Out of Food in the Last Year: Never true    Ran Out of Food in the Last Year: Never true  Transportation Needs: No Transportation Needs (08/12/2022)   PRAPARE - Hydrologist (Medical): No    Lack of Transportation (Non-Medical): No  Physical Activity: Not on file   Stress: No Stress Concern Present (11/06/2021)   [place]    Feeling of Stress : Only a little  Social Connections: Moderately Integrated (11/06/2021)   Social Connection and Isolation Panel [NHANES]    Frequency of Communication with Friends and Family: Three times a week    Frequency of Social Gatherings with Friends and Family: More than three times a week    Attends Religious Services: More than 4 times per year    Active Member of Clubs or Organizations: Yes    Attends Archivist Meetings: 1 to 4 times per year    Marital Status: Widowed  Intimate Partner Violence: Not At Risk (08/12/2022)   Humiliation, Afraid, Rape, and Kick questionnaire    Fear of Current or Ex-Partner: No    Emotionally Abused: No    Physically Abused: No    Sexually Abused: No    FAMILY HISTORY: Family History  Problem Relation Age of Onset   Cancer Father        oral cancer   Cancer Maternal Grandmother        ovarian cancer   Hypertension Maternal Grandmother    Diabetes Maternal Grandfather    Diabetes  Paternal Aunt    Breast cancer Neg Hx     ALLERGIES:  is allergic to elemental sulfur, sulfa antibiotics, beef-derived products, lambs quarters, and milk-related compounds.  MEDICATIONS:  Current Outpatient Medications  Medication Sig Dispense Refill   chlorthalidone (HYGROTON) 25 MG tablet Take 12.5 mg by mouth in the morning.     Cholecalciferol (VITAMIN D3 PO) Take 1 tablet by mouth daily.      clonazePAM (KLONOPIN) 0.5 MG tablet Take 1 tablet (0.5 mg total) by mouth daily as needed for anxiety. 20 tablet 0   cyanocobalamin 1000 MCG tablet Take 1,000 mcg by mouth daily. Vitamin B12     enalapril (VASOTEC) 20 MG tablet Take 0.5 tablets (10 mg total) by mouth 2 (two) times daily.     Ferrous Sulfate (IRON) 325 (65 Fe) MG TABS Take 1 tablet by mouth daily.     fluticasone (FLONASE) 50 MCG/ACT nasal spray Place 2 sprays  into both nostrils daily. 16 g 6   hydroxychloroquine (PLAQUENIL) 200 MG tablet Take 200 mg by mouth every other day.     lipase/protease/amylase (CREON) 36000 UNITS CPEP capsule Take 2 capsules with the first bite of each meal and 1 capsule with the first bite of each snack 240 capsule 2   meclizine (ANTIVERT) 12.5 MG tablet Take 1 tablet (12.5 mg total) by mouth 3 (three) times daily as needed for dizziness. 30 tablet 0   traZODone (DESYREL) 50 MG tablet TAKE 1 TABLET BY MOUTH EVERY DAY AT BEDTIME AS NEEDED FOR SLEEP 90 tablet 0   oxyCODONE (OXY IR/ROXICODONE) 5 MG immediate release tablet Take 1 tablet (5 mg total) by mouth every 6 (six) hours as needed for moderate pain. (Patient not taking: Reported on 10/02/2022) 20 tablet 0   Current Facility-Administered Medications  Medication Dose Route Frequency Provider Last Rate Last Admin   cyanocobalamin (VITAMIN B12) injection 1,000 mcg  1,000 mcg Intramuscular Q7 weeks Jon Billings, NP   1,000 mcg at 09/17/22 1153   cyanocobalamin (VITAMIN B12) injection 1,000 mcg  1,000 mcg Intramuscular Q30 days Jon Billings, NP   1,000 mcg at 09/25/22 1157      .  PHYSICAL EXAMINATION: ECOG PERFORMANCE STATUS: 1 - Symptomatic but completely ambulatory  Vitals:   10/02/22 1500  BP: 111/71  Pulse: 77  Resp: 18  Temp: 99 F (37.2 C)   Filed Weights   10/02/22 1500  Weight: 181 lb 9.6 oz (82.4 kg)    Physical Exam Vitals and nursing note reviewed.  Constitutional:      Comments:     HENT:     Head: Normocephalic and atraumatic.     Mouth/Throat:     Mouth: Mucous membranes are moist.     Pharynx: No oropharyngeal exudate.  Eyes:     Pupils: Pupils are equal, round, and reactive to light.  Cardiovascular:     Rate and Rhythm: Normal rate and regular rhythm.  Pulmonary:     Effort: No respiratory distress.     Breath sounds: No wheezing.     Comments: Decreased breath sounds bilaterally at bases.  No wheeze or  crackles Abdominal:     General: Bowel sounds are normal. There is no distension.     Palpations: Abdomen is soft. There is no mass.     Tenderness: There is no abdominal tenderness. There is no guarding or rebound.  Musculoskeletal:        General: No tenderness. Normal range of motion.     Cervical  back: Normal range of motion and neck supple.  Skin:    General: Skin is warm.  Neurological:     Mental Status: She is alert and oriented to person, place, and time.  Psychiatric:        Mood and Affect: Affect normal.        Judgment: Judgment normal.     LABORATORY DATA:  I have reviewed the data as listed Lab Results  Component Value Date   WBC 7.0 09/24/2022   HGB 8.5 (L) 09/24/2022   HCT 25.6 (L) 09/24/2022   MCV 97.7 09/24/2022   PLT 602 (H) 09/24/2022   Recent Labs    08/12/22 0625 08/15/22 1608 08/20/22 1418 08/21/22 1457 09/24/22 0934  NA 138   < > 134* 135 137  K 4.2   < > 4.2 4.9 4.4  CL 108   < > 105 100 105  CO2 23   < > _0 GLUCOSE 105*   < > 130* 86 102*  BUN 26*   < > 21 19 27*  CREATININE 1.56*   < > 1.44* 1.55* 1.52*  CALCIUM 8.6*   < > 8.7* 9.4 8.9  GFRNONAA 36*  --  40*  --  37*  PROT  --   --   --  6.8  --   ALBUMIN  --   --   --  4.2  --   AST  --   --   --  14  --   ALT  --   --   --  7  --   ALKPHOS  --   --   --  85  --   BILITOT  --   --   --  0.4  --    < > = values in this interval not displayed.    RADIOGRAPHIC STUDIES: I have personally reviewed the radiological images as listed and agreed with the findings in the report. No results found.  ASSESSMENT & PLAN:   Symptomatic anemia # Chronic normocytic anemia/CKD-IIIl BL Hb 10]- Hb- OCT 2023 [s/p dog bite/infection]- Hb 8.2. Iron studies suggetsive of Anemia of chronic disease- Today- Dec 2023-hemoglobin 8.5.  Continue oral iron.   # Chronic thrombocytosis and monocytosis [since 2012]. Possible myeloproliferative disorder such as CMML; April 2022-bone marrow biopsy-no acute  process.  Elevated platelets exacerbated by-the active process/dog bite.  Continue surveillance.  # B12 deficiency [MAY 2022- b12-126] on B12 injection q 80M; but DEC 2023- ELEVATED B12 1300. Discussed with pt re: repeating B12 injections in 3 months.  Patient prefers to have B12 injection with PCP; discussed with Ms. Holdsworth.   # CKD-III [Dr.Murphy, nephrology]-lupus [1987] versus others defer to nephrology- STABLE  # Malabsorbtion sec to pancreas- [on [place]]- on creon-stable.  # DISPOSITION: # follow up in 6 months- MD; 2-3 days prior-labs-cbc/bmp/ iron studies/ferritin; LDH-B12 levels; possible venofer- .  -Dr.B  All questions were answered. The patient knows to call the clinic with any problems, questions or concerns.    Cammie Sickle, MD 10/02/2022 4:11 PM

## 2022-10-02 NOTE — Assessment & Plan Note (Addendum)
#  Chronic normocytic anemia/CKD-IIIl BL Hb 10]- Hb- OCT 2023 [s/p dog bite/infection]- Hb 8.2. Iron studies suggetsive of Anemia of chronic disease- Today- Dec 2023-hemoglobin 8.5.  Continue oral iron.   # Chronic thrombocytosis and monocytosis [since 2012]. Possible myeloproliferative disorder such as CMML; April 2022-bone marrow biopsy-no acute process.  Elevated platelets exacerbated by-the active process/dog bite.  Continue surveillance.  # B12 deficiency [MAY 2022- b12-126] on B12 injection q 3M; but DEC 2023- ELEVATED B12 1300. Discussed with pt re: repeating B12 injections in 3 months.  Patient prefers to have B12 injection with PCP; discussed with Ms. Holdsworth.   # CKD-III [Dr.Murphy, nephrology]-lupus [1987] versus others defer to nephrology- STABLE  # Malabsorbtion sec to pancreas- [on V[place]]- on creon-stable.  # DISPOSITION: # follow up in 6 months- MD; 2-3 days prior-labs-cbc/bmp/ iron studies/ferritin; LDH-B12 levels; possible venofer- .  -Dr.B 

## 2022-10-02 NOTE — Progress Notes (Signed)
Patient received 3 weekly B12 injections at PCP with last one 09/25/22.  Energy level is not improving.

## 2022-10-04 DIAGNOSIS — S80812A Abrasion, left lower leg, initial encounter: Secondary | ICD-10-CM | POA: Diagnosis not present

## 2022-10-04 DIAGNOSIS — H524 Presbyopia: Secondary | ICD-10-CM | POA: Diagnosis not present

## 2022-10-04 DIAGNOSIS — M328 Other forms of systemic lupus erythematosus: Secondary | ICD-10-CM | POA: Diagnosis not present

## 2022-10-04 DIAGNOSIS — H52223 Regular astigmatism, bilateral: Secondary | ICD-10-CM | POA: Diagnosis not present

## 2022-10-04 DIAGNOSIS — H5213 Myopia, bilateral: Secondary | ICD-10-CM | POA: Diagnosis not present

## 2022-10-04 DIAGNOSIS — H43812 Vitreous degeneration, left eye: Secondary | ICD-10-CM | POA: Diagnosis not present

## 2022-10-04 DIAGNOSIS — M3210 Systemic lupus erythematosus, organ or system involvement unspecified: Secondary | ICD-10-CM | POA: Diagnosis not present

## 2022-10-04 DIAGNOSIS — H2513 Age-related nuclear cataract, bilateral: Secondary | ICD-10-CM | POA: Diagnosis not present

## 2022-10-04 DIAGNOSIS — Z8669 Personal history of other diseases of the nervous system and sense organs: Secondary | ICD-10-CM | POA: Diagnosis not present

## 2022-10-04 DIAGNOSIS — Z79899 Other long term (current) drug therapy: Secondary | ICD-10-CM | POA: Diagnosis not present

## 2022-10-08 ENCOUNTER — Ambulatory Visit: Payer: Medicare Other | Admitting: Physician Assistant

## 2022-10-08 ENCOUNTER — Ambulatory Visit
Admission: RE | Admit: 2022-10-08 | Discharge: 2022-10-08 | Disposition: A | Discharge status: Home / Self Care | Payer: Medicare Other | Source: Ambulatory Visit | Attending: Gastroenterology | Admitting: Gastroenterology

## 2022-10-08 DIAGNOSIS — K8681 Exocrine pancreatic insufficiency: Secondary | ICD-10-CM | POA: Diagnosis not present

## 2022-10-08 DIAGNOSIS — K5730 Diverticulosis of large intestine without perforation or abscess without bleeding: Secondary | ICD-10-CM | POA: Diagnosis not present

## 2022-10-08 DIAGNOSIS — N281 Cyst of kidney, acquired: Secondary | ICD-10-CM | POA: Diagnosis not present

## 2022-10-08 MED ORDER — IOHEXOL 300 MG/ML  SOLN
80.0000 mL | Freq: Once | INTRAMUSCULAR | Status: AC | PRN
  Administered 2022-10-08: 80 mL via INTRAVENOUS

## 2022-10-09 ENCOUNTER — Telehealth: Payer: Self-pay

## 2022-10-09 ENCOUNTER — Encounter: Payer: Medicare Other | Admitting: Physician Assistant

## 2022-10-09 ENCOUNTER — Ambulatory Visit: Payer: Medicare Other | Admitting: Nurse Practitioner

## 2022-10-09 DIAGNOSIS — M328 Other forms of systemic lupus erythematosus: Secondary | ICD-10-CM | POA: Diagnosis not present

## 2022-10-09 DIAGNOSIS — L97812 Non-pressure chronic ulcer of other part of right lower leg with fat layer exposed: Secondary | ICD-10-CM | POA: Diagnosis not present

## 2022-10-09 DIAGNOSIS — I10 Essential (primary) hypertension: Secondary | ICD-10-CM | POA: Diagnosis not present

## 2022-10-09 DIAGNOSIS — L03116 Cellulitis of left lower limb: Secondary | ICD-10-CM | POA: Diagnosis not present

## 2022-10-09 DIAGNOSIS — L97823 Non-pressure chronic ulcer of other part of left lower leg with necrosis of muscle: Secondary | ICD-10-CM | POA: Diagnosis not present

## 2022-10-09 DIAGNOSIS — S8012XA Contusion of left lower leg, initial encounter: Secondary | ICD-10-CM | POA: Diagnosis not present

## 2022-10-09 DIAGNOSIS — S80812A Abrasion, left lower leg, initial encounter: Secondary | ICD-10-CM | POA: Diagnosis not present

## 2022-10-09 DIAGNOSIS — T792XXA Traumatic secondary and recurrent hemorrhage and seroma, initial encounter: Secondary | ICD-10-CM | POA: Diagnosis not present

## 2022-10-09 DIAGNOSIS — D513 Other dietary vitamin B12 deficiency anemia: Secondary | ICD-10-CM | POA: Diagnosis not present

## 2022-10-09 DIAGNOSIS — L97122 Non-pressure chronic ulcer of left thigh with fat layer exposed: Secondary | ICD-10-CM | POA: Diagnosis not present

## 2022-10-09 DIAGNOSIS — L98492 Non-pressure chronic ulcer of skin of other sites with fat layer exposed: Secondary | ICD-10-CM | POA: Diagnosis not present

## 2022-10-09 DIAGNOSIS — M1990 Unspecified osteoarthritis, unspecified site: Secondary | ICD-10-CM | POA: Diagnosis not present

## 2022-10-09 NOTE — Progress Notes (Addendum)
BRAIDEN, PRESUTTI [name] ([id number]) [id number] Page 1 of 9 Visit Report for 10/09/2022 Chief Complaint Document Details Patient Name: Date of Service: [name]. 10/09/2022 9:45 A M Medical Record Number: [id number] Patient Account Number: [id number] Date of Birth/Sex: Treating RN: [birth year] (66 y.o. Female) Cornell Barman Primary Care Provider: Jon Billings Other Clinician: Massie Kluver Referring Provider: Treating Provider/Extender: Venia Minks in Treatment: 6 Information Obtained from: Patient Chief Complaint Right knee and left thigh ulcers secondary to a dog bite and fall Electronic Signature(s) Signed: 10/09/2022 10:02:34 AM By: [name] PA-C Entered By: [name] on 10/09/2022 10:02:34 -------------------------------------------------------------------------------- HPI Details Patient Name: Date of Service: [name]. 10/09/2022 9:45 A M Medical Record Number: [id number] Patient Account Number: [id number] Date of Birth/Sex: Treating RN: Feb 07, 1956 (66 y.o. Female) Cornell Barman Primary Care Provider: Jon Billings Other Clinician: Massie Kluver Referring Provider: Treating Provider/Extender: Venia Minks in Treatment: 6 History of Present Illness HPI Description: 08-24-2022 patient presents today for initial evaluation here in our clinic concerning issues that she has been having with wounds in 3 locations. 1 areas in the proximal left thigh this is due to a fall that she sustained on 08-11-2022. Subsequently she has then a second issue in the distal thigh as well as the right knee location. These are due to a unusual situation where she was actually leaving had just been petting her grand dog when she walked to her car. She tells me that she got dizzy and stumbled and fell and when she fell she called out. When she did this subsequently her grand dog ran over  were not sure if that he thought he was playing with her what but nonetheless ended up biting her on the left thigh as well as on the right knee. Here she had 2 areas of laceration which she subsequently went to the ER for and they attempted to suture both closed. Unfortunately that just did not take. At this point [name] think the sutures can need to be removed from both locations and to be honest there is a significant area of necrosis on the left thigh this may need to be cleaned out and away. Fortunately I do not see any signs of systemic infection though locally there is some evidence of infection she has been on Augmentin which just recently switched to doxycycline no cultures been performed at this point. From the standpoint of past medical history the patient does have a history of anemia which they believe is related to B12. She does not have diabetes she does have a history of lupus however. She is a former smoker. 11/7; this is a patient who has a severe dog bite injury on her upper anterior left thigh. Also an area on the right medial knee. We have been seeing her daily and using Dakin's wet-to-dry. [name] was shown yesterday the culture of drainage that was done last week that shows Pseudomonas. She has multiple drug interactions with quinolones therefore [name] [name], [name] ([id number]) [id number] Page 2 of 9 gave her a third-generation cephalosporin. HOWEVER apparently the pharmacy would not fill the prescription because of the concerns about allergic reaction to the capsule itself related to beef products. [name] could not find this anywhere even on Google however she has taken capsule antibiotics in the past and [name] told her [name] think that this risk is small compared to not treating the Pseudomonas. She has not been systemically unwell she is  in some discomfort 09-04-2022 upon evaluation today patient unfortunately he is continuing to have some significant issues here with  her leg although I do feel like she is better than she was last week. Fortunately there does not appear to be any evidence of systemic infection though locally we definitely noted Pseudomonas as the causative agent on the left thigh location with a culture that needed from the deep tissue wound area. Subsequently the only active antibiotics that are going to take care of this are Cipro, Levaquin, and Baxdela. Unfortunately the Cipro and Levaquin cannot be taken by her due to the interaction with Plaquenil causing a QT prolongation which is good to be a significant issue. I do not want that to be a complicating factor and therefore we need to look toward the [place] which [name] understand is definitely expensive but [name] am not sure if there is another option to be perfectly honest. [name] discussed this with the patient today and she is in agreement with plan we have also made a referral to infectious disease though if we can get this treated with Baxdela then she may not even end up requiring the infectious disease referral. Upon inspection patient's wound bed actually showed signs of 09-11-2022 upon evaluation today patient appears to be doing well currently in regard to her wound. She is actually making progress and we actually did find out today that we did get the [place] approved. This is great news as [name] think it will probably prevent her from having to go to the infectious disease doctors will get the infection under control as it stands right now. She is very pleased to hear this and [name] likewise [name] am extremely pleased. It still can be quite pricey it is good to be $350 with her insurance but that is better than close to 2000 without. 09-17-2022 upon evaluation today patient appears to be doing well currently in regard to her wounds. She has been tolerating the dressing changes without complication. Fortunately I do not see any evidence of infection at this point which is great news we still had a  tremendously difficult period of time trying to get the [place] approved and then once we got it approved apparently the pharmacy was unable to actually order the medication. This has been an extremely frustrating process in general. The patient has been extremely frustrated. 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 Nonetheless I do believe that this is still the best way to go with the topical gentamicin. 09-24-2022 upon evaluation today patient appears to be doing well with regard to her wounds she is actually making excellent progress and [name] think we are ready to switch to [facility] for the left leg as well we have already been using this on the right knee with great results. 10-01-2022 upon evaluation today patient actually appears to be doing excellent. She has been tolerating the [facility] and an excellent fashion without complication and overall [name] am extremely pleased with where we stand today. There does not appear to be any signs of active infection locally nor systemically at this time. No fevers, chills, nausea, vomiting, or diarrhea. 10-09-2022 upon evaluation today patient appears to be doing well currently in regard to her wounds. Both are showing signs  of excellent improvement there are some hypergranulation noted at both locations but other than that she really is doing quite well. Electronic Signature(s) Signed: 10/09/2022 3:00:53 PM By: [name] PA-C Entered By: [name] on 10/09/2022 15:00:52 -------------------------------------------------------------------------------- [name] TISS Details Patient Name: Date of Service: [name]. 10/09/2022 9:45 A M Medical Record Number: [id number] Patient Account Number:  [id number] Date of Birth/Sex: Treating RN: 1956/01/22 (67 y.o. Female) Cornell Barman Primary Care Provider: Jon Billings Other Clinician: Massie Kluver Referring Provider: Treating Provider/Extender: Venia Minks in Treatment: 6 Procedure Performed for: Wound #3 Right Knee Performed By: Physician [name] Sams., PA-C Post Procedure Diagnosis Same as Pre-procedure Notes one silver nitrate used on right knee Electronic Signature(s) Signed: 10/09/2022 4:44:06 PM By: Massie Kluver Entered By: Massie Kluver on 10/09/2022 10:14:22 [name] ([id number]) [id number] Page 3 of 9 -------------------------------------------------------------------------------- CHEM CAUT GRANULATION TISS Details Patient Name: Date of Service: [name]. 10/09/2022 9:45 A M Medical Record Number: [id number] Patient Account Number: [id number] Date of Birth/Sex: Treating RN: 1956/05/31 (66 y.o. Female) Cornell Barman Primary Care Provider: Jon Billings Other Clinician: Massie Kluver Referring Provider: Treating Provider/Extender: Venia Minks in Treatment: 6 Procedure Performed for: Wound #2 Left,Distal Upper Leg Performed By: Physician [name] Sams., PA-C Post Procedure Diagnosis Same as Pre-procedure Notes one silver nitrate stick used on left upper leg Electronic Signature(s) Signed: 10/09/2022 4:44:06 PM By: Massie Kluver Entered By: Massie Kluver on 10/09/2022 10:15:55 -------------------------------------------------------------------------------- Physical Exam Details Patient Name: Date of Service: [name]. 10/09/2022 9:45 A M Medical Record Number: [id number] Patient Account Number: [id number] Date of Birth/Sex: Treating RN: May 30, 1956 (66 y.o. Female) Cornell Barman Primary Care Provider: Jon Billings Other Clinician: Massie Kluver Referring Provider: Treating  Provider/Extender: Venia Minks in Treatment: 6 Constitutional Well-nourished and well-hydrated in no acute distress. Respiratory normal breathing without difficulty. Psychiatric this patient is able to make decisions and demonstrates good insight into disease process. Alert and Oriented x 3. pleasant and cooperative. Notes Patient's wounds are not showing any signs of infection at this point which is great news and overall [name] am extremely pleased in that regard. I do not see anything that [name] feel like is going require any debridement going forward [name] [name] perform chemical cauterization with silver nitrate in order to help some of hypergranulation but other than that [name] think were really doing quite well at this point. She is very pleased with where things stand currently. Electronic Signature(s) Signed: 10/09/2022 3:01:27 PM By: Anselm Pancoast, [place] [name] ([id number]) [id number] Page 4 of 9 Entered By: [name] on 10/09/2022 15:01:26 -------------------------------------------------------------------------------- Physician Orders Details Patient Name: Date of Service: [name]. 10/09/2022 9:45 A M Medical Record Number: [id number] Patient Account Number: [id number] Date of Birth/Sex: Treating RN: [birth year] (66 y.o. 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 Nurse Visit as needed - nurse visit once weekly Anesthetic (Use 'Patient Medications' Section for Anesthetic Order Entry) Lidocaine applied to wound bed Edema Control - Lymphedema / Segmental Compressive Device / Other Elevate, Exercise Daily and A void Standing for Long Periods of Time. Elevate legs to the level of the heart and pump ankles as often as possible Elevate leg(s) parallel to the floor when sitting. Wound Treatment Wound #2 - Upper Leg Wound Laterality: Left, Distal Peri-Wound Care: Skin Prep 3 x Per Week/30 Days Discharge Instructions: Use skin prep as directed Prim Dressing: Hydrofera Blue Ready Transfer Foam, 2.5x2.5 (in/in) (Dispense As Written) 3 x Per Week/30 Days ary Discharge Instructions: Apply Hydrofera Blue Ready to wound bed as directed Secondary Dressing: (BORDER) Zetuvit Plus SILICONE BORDER Dressing 5x5 (in/in) (DME) (Dispense As Written) 3 x Per Week/30 Days Discharge Instructions: Please do not put silicone bordered dressings under wraps. Use non-bordered dressing only. Secured With: Medipore T - 45M Medipore H Soft Cloth Surgical T ape ape, 2x2 (in/yd) 3 x Per Week/30 Days Wound #3 - Knee Wound Laterality: Right Prim Dressing: Hydrofera Blue Ready Transfer Foam, 4x5 (in/in) 3 x Per Week/30 Days ary Discharge Instructions: Apply Hydrofera Blue Ready to wound bed as directed Secondary Dressing: (BORDER) Zetuvit Plus SILICONE BORDER Dressing 4x4 (in/in) 3 x Per Week/30 Days Discharge Instructions: Please do not put silicone bordered dressings under wraps. Use non-bordered dressing only. [name], [name] ([id number]) [id number] Page 5 of 9 Electronic  Signature(s) Signed: 10/09/2022 4:11:06 PM By: [name] PA-C Signed: 10/09/2022 4:44:06 PM By: Massie Kluver Entered By: Massie Kluver on 10/09/2022 10:24:09 -------------------------------------------------------------------------------- Problem List Details Patient Name: Date of Service: [name]. 10/09/2022 9:45 A M Medical Record Number: [id number] Patient Account Number: [id number] Date of Birth/Sex: Treating RN: [birth year] (66 y.o. Female) Cornell Barman Primary Care Provider: Jon Billings Other Clinician: Massie Kluver Referring Provider: Treating Provider/Extender: Venia Minks in Treatment: 6 Active Problems ICD-10 Encounter Code Description Active Date MDM Diagnosis W54.0XXA Bitten by dog, initial encounter 08/24/2022 No Yes L03.116 Cellulitis of left lower limb 09/04/2022 No Yes S80.12XA Contusion of left lower leg, initial encounter 08/24/2022 No Yes T79.2XXA Traumatic secondary and recurrent hemorrhage and seroma, initial encounter 08/24/2022 No Yes S80.812A Abrasion, left lower leg, initial encounter 08/24/2022 No Yes L97.823 Non-pressure chronic ulcer of other part of left lower leg with necrosis of 08/24/2022 No Yes muscle L98.492 Non-pressure chronic ulcer of skin of other sites with fat layer exposed 08/24/2022 No Yes D51.3 Other dietary vitamin B12 deficiency anemia 08/24/2022 No Yes M32.8 Other forms of systemic lupus erythematosus 08/24/2022 No Yes Inactive Problems Resolved Problems [name], [name] ([id number]) [id number] Page 6 of 9 Electronic Signature(s) Signed: 10/09/2022 10:02:31 AM By: [name] PA-C Entered By: [name] on 10/09/2022 10:02:31 -------------------------------------------------------------------------------- Progress Note Details Patient Name: Date of Service: [name]. 10/09/2022 9:45 A M Medical Record Number: [id number] Patient Account  Number: [id number] Date of Birth/Sex: Treating RN: 1955/11/22 (66 y.o. Female) Cornell Barman Primary Care Provider: Jon Billings Other Clinician: Massie Kluver Referring Provider: Treating Provider/Extender: Venia Minks in Treatment: 6 Subjective Chief Complaint Information obtained from Patient Right knee and left thigh ulcers secondary to a dog bite and fall History of Present Illness (HPI) 08-24-2022 patient presents today for initial evaluation here in our clinic concerning issues  that she has been having with wounds in 3 locations. 1 areas in the proximal left thigh this is due to a fall that she sustained on 08-11-2022. Subsequently she has then a second issue in the distal thigh as well as the right knee location. These are due to a unusual situation where she was actually leaving had just been petting her grand dog when she walked to her car. She tells me that she got dizzy and stumbled and fell and when she fell she called out. When she did this subsequently her grand dog ran over were not sure if that he thought he was playing with her what but nonetheless ended up biting her on the left thigh as well as on the right knee. Here she had 2 areas of laceration which she subsequently went to the ER for and they attempted to suture both closed. Unfortunately that just did not take. At this point [name] think the sutures can need to be removed from both locations and to be honest there is a significant area of necrosis on the left thigh this may need to be cleaned out and away. Fortunately I do not see any signs of systemic infection though locally there is some evidence of infection she has been on Augmentin which just recently switched to doxycycline no cultures been performed at this point. From the standpoint of past medical history the patient does have a history of anemia which they believe is related to B12. She does not have diabetes she does have a history of  lupus however. She is a former smoker. 11/7; this is a patient who has a severe dog bite injury on her upper anterior left thigh. Also an area on the right medial knee. We have been seeing her daily and using Dakin's wet-to-dry. [name] was shown yesterday the culture of drainage that was done last week that shows Pseudomonas. She has multiple drug interactions with quinolones therefore [name] gave her a third-generation cephalosporin. HOWEVER apparently the pharmacy would not fill the prescription because of the concerns about allergic reaction to the capsule itself related to beef products. [name] could not find this anywhere even on Google however she has taken capsule antibiotics in the past and [name] told her [name] think that this risk is small compared to not treating the Pseudomonas. She has not been systemically unwell she is in some discomfort 09-04-2022 upon evaluation today patient unfortunately he is continuing to have some significant issues here with her leg although I do feel like she is better than she was last week. Fortunately there does not appear to be any evidence of systemic infection though locally we definitely noted Pseudomonas as the causative agent on the left thigh location with a culture that needed from the deep tissue wound area. Subsequently the only active antibiotics that are going to take care of this are Cipro, Levaquin, and Baxdela. Unfortunately the Cipro and Levaquin cannot be taken by her due to the interaction with Plaquenil causing a QT prolongation which is good to be a significant issue. I do not want that to be a complicating factor and therefore we need to look toward the [place] which [name] understand is definitely expensive but [name] am not sure if there is another option to be perfectly honest. [name] discussed this with the patient today and she is in agreement with plan we have also made a referral to infectious disease though if we can get this treated with Baxdela then she may not even  end up requiring the infectious disease referral. Upon inspection patient's wound bed actually showed signs of 09-11-2022 upon evaluation today patient appears to be doing well currently in regard to her wound. She is actually making progress and we actually did find out today that we did get the [place] approved. This is great news as [name] think it will probably prevent her from having to go to the infectious disease doctors will get the infection under control as it stands right now. She is very pleased to hear this and [name] likewise [name] am extremely pleased. It still can be quite pricey it is good to be $350 with her insurance but that is better than close to 2000 without. 09-17-2022 upon evaluation today patient appears to be doing well currently in regard to her wounds. She has been tolerating the dressing changes without complication. Fortunately I do not see any evidence of infection at this point which is great news we still had a tremendously difficult period of time trying to get the [place] approved and then once we got it approved apparently the pharmacy was unable to actually order the medication. This has been an extremely frustrating process in general. The patient has been extremely frustrated. 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 Nonetheless I do believe that this is still the best way to go with the topical gentamicin. 09-24-2022 upon evaluation today patient appears to be doing well with regard to her wounds she is actually making excellent progress and [name] think we are ready to switch to [facility] for the left leg as well we have already been using this on the right knee with great results. 10-01-2022 upon evaluation today patient actually appears to be doing excellent.  She has been tolerating the [facility] and an excellent fashion without complication and overall [name] am extremely pleased with where we stand today. There does not appear to be any signs of active infection locally nor systemically at this time. No fevers, chills, nausea, vomiting, or diarrhea. 10-09-2022 upon evaluation today patient appears to be doing well currently in regard to her wounds. Both are showing signs of excellent improvement there are [name], [name] ([id number]) [id number] Page 7 of 9 some hypergranulation noted at both locations but other than that she really is doing quite well. Objective Constitutional Well-nourished and well-hydrated in no acute distress. Vitals Time Taken: 9:55 AM, Temperature: 98.2 F, Pulse: 72 bpm, Respiratory Rate: 16 breaths/min, Blood Pressure: 134/81 mmHg. Respiratory normal breathing without difficulty. Psychiatric this patient is able to make decisions and demonstrates good insight into disease process. Alert and Oriented x 3. pleasant and cooperative. General Notes: Patient's wounds are not showing any signs of infection at this point which is great news and overall [name] am extremely pleased in that regard. I do not see anything that [name] feel like is going require any debridement going forward [name] [name] perform chemical cauterization with silver nitrate in order to help some of hypergranulation but other than that [name] think were really doing quite well at this point. She is very pleased with where things stand currently. Integumentary (Hair, Skin) Wound #2 status is Open. Original cause of wound was Trauma. The date acquired was: 08/11/2022. The wound has been in treatment 6 weeks. The  wound is located on the Left,Distal Upper Leg. The wound measures 4cm length x 5.4cm width x 0.1cm depth; 16.965cm^2 area and 1.696cm^3 volume. There is Fat Layer (Subcutaneous Tissue) exposed. There is no tunneling or undermining noted.  There is a medium amount of serosanguineous drainage noted. Wound #3 status is Open. Original cause of wound was Trauma. The date acquired was: 08/11/2022. The wound has been in treatment 6 weeks. The wound is located on the Right Knee. The wound measures 0.5cm length x 2cm width x 0.1cm depth; 0.785cm^2 area and 0.079cm^3 volume. There is Fat Layer (Subcutaneous Tissue) exposed. There is no tunneling or undermining noted. There is a large amount of serosanguineous drainage noted. There is small (1-33%) hyper - granulation within the wound bed. There is no necrotic tissue within the wound bed. 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 An CHEM CAUT GRANULATION TISS procedure was performed by [name] Sams., PA-C. Post procedure Diagnosis Wound #2: Same as Pre-Procedure Notes: one silver nitrate stick used on left upper leg Wound #3 Pre-procedure diagnosis of Wound #3 is a Trauma, Other located on the Right Knee . An CHEM CAUT GRANULATION TISS procedure was performed by [name] Sams., PA-C. Post procedure Diagnosis Wound #3: Same as Pre-Procedure Notes: one silver nitrate used on right knee Plan Follow-up Appointments: Return Appointment in 1 week. Nurse Visit as needed - nurse visit once weekly Anesthetic (Use 'Patient Medications' Section for Anesthetic Order Entry): Lidocaine applied to wound bed [name], [name] ([id number]) [id number].pdf Page 8 of 9 Edema Control - Lymphedema / Segmental Compressive Device / Other: Elevate, Exercise Daily and Avoid Standing for Long Periods of Time. Elevate legs to the level of the heart and pump ankles as often as possible Elevate leg(s) parallel to the floor when sitting. WOUND #2: - Upper Leg Wound Laterality: Left, Distal Peri-Wound Care: Skin Prep 3 x Per Week/30 Days Discharge Instructions: Use skin prep as directed Prim Dressing: Hydrofera Blue Ready Transfer Foam, 2.5x2.5 (in/in) (Dispense As Written) 3 x Per Week/30 Days ary Discharge Instructions: Apply Hydrofera Blue Ready to wound bed as directed Secondary Dressing: (BORDER) Zetuvit Plus SILICONE BORDER Dressing 5x5 (in/in) (DME) (Dispense As Written) 3 x Per Week/30 Days Discharge Instructions: Please do not put silicone bordered dressings under wraps. Use non-bordered dressing only. Secured With: Medipore T - 52M Medipore H Soft Cloth Surgical T ape ape, 2x2 (in/yd) 3 x Per Week/30 Days WOUND #3: - Knee Wound Laterality: Right Prim Dressing: Hydrofera Blue Ready Transfer Foam, 4x5 (in/in) 3 x Per Week/30 Days ary Discharge Instructions: Apply Hydrofera Blue Ready to wound bed as directed Secondary Dressing: (BORDER) Zetuvit Plus SILICONE BORDER Dressing 4x4 (in/in) 3 x Per Week/30 Days Discharge Instructions: Please do not put silicone bordered dressings under wraps. Use non-bordered dressing only. 1. [name] am going to suggest that we have the patient continue with the [facility] dressing they were going to drop the gentamicin at this point [name] think that is no longer necessary.  2. [name] am also can recommend that we have the patient continue with the bordered foam dressing to cover or a large Band-Aid [name] think that is appropriate currently. 3. Following the chemical cauterization and hopeful that this will help flatten out a lot of the hypergranulation and we will see how  things proceed from here. We will see patient back for reevaluation in 1 week here in the clinic. If anything worsens or changes patient will contact our office for additional recommendations. Electronic Signature(s) Signed: 10/09/2022 3:02:10 PM By: [name] PA-C Entered By: [name] on 10/09/2022 15:02:09 -------------------------------------------------------------------------------- SuperBill Details Patient Name: Date of Service: [name]. 10/09/2022 Medical Record Number: [id number] Patient Account Number: [id number] Date of Birth/Sex: Treating RN: 1956-04-15 (66 y.o. 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.3 Other dietary vitamin B12 deficiency anemia M32.8 Other forms of systemic lupus erythematosus Facility Procedures : [name], [name] Code: [id number] Maia Petties [phone] Description: 30940 - CHEM CAUT GRANULATION TISS ICD-10 Diagnosis Description L97.823 Non-pressure chronic ulcer of other part of left lower leg with necrosis of musc L98.492 Non-pressure chronic ulcer of skin of other sites with fat layer exposed 46)  [id number] Modifier: [id number].pdf Pag Quantity: 2 e 9 of 9 Physician Procedures : CPT4 Code Description Modifier [phone] 17250 - WC PHYS CHEM CAUT GRAN TISSUE ICD-10 Diagnosis  Description L97.823 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

## 2022-10-09 NOTE — Telephone Encounter (Signed)
Tried to call patient but mailbox is full  

## 2022-10-09 NOTE — Telephone Encounter (Signed)
Patient verbalized understanding of results  

## 2022-10-09 NOTE — Telephone Encounter (Signed)
-----   Message from Lin Landsman, MD sent at 10/09/2022  4:24 PM EST ----- Please inform patient that the CT of the abdomen and pelvis with contrast came back showing normal-appearing pancreas.  No further imaging is needed at this time  RV

## 2022-10-09 NOTE — Progress Notes (Signed)
[name], BERKE [name] ([id number]) [id number] Page 1 of 9 Visit Report for 10/09/2022 Arrival Information Details Patient Name: Date of Service: Kristine Mater [name]. 10/09/2022 9:45 A M Medical Record Number: [id number] Patient Account Number: [id number] Date of Birth/Sex: Treating RN: [birth year] (66 y.o. Female) Cornell Barman Primary Care Katilin Raynes: Jon Billings Other Clinician: Massie Kluver Referring Jelani Trueba: Treating Nahshon Reich/Extender: Venia Minks in Treatment: 6 Visit Information History Since Last Visit All ordered tests and consults were completed: No Patient Arrived: Ambulatory Added or deleted any medications: No Arrival Time: 09:53 Any new allergies or adverse reactions: No Transfer Assistance: None Had a fall or experienced change in No Patient Identification Verified: Yes activities of daily living that may affect Secondary Verification Process Completed: Yes risk of falls: Patient Requires Transmission-Based Precautions: No Signs or symptoms of abuse/neglect since last visito No Patient Has Alerts: Yes Hospitalized since last visit: No Patient Alerts: NOT Diabetic Implantable device outside of the clinic excluding No cellular tissue based products placed in the center since last visit: Has Dressing in Place as Prescribed: Yes Pain Present Now: No Electronic Signature(s) Signed: 10/09/2022 4:44:06 PM By: Massie Kluver Entered By: Massie Kluver on 10/09/2022 09:54:44 -------------------------------------------------------------------------------- Clinic Level of Care Assessment Details Patient Name: Date of Service: Kristine Mater [name]. 10/09/2022 9:45 A M Medical Record Number: [id number] Patient Account Number: [id number] Date of Birth/Sex: Treating RN: [birth year] (66 y.o. Female) Cornell Barman Primary Care Ligaya Cormier: Jon Billings Other Clinician: Massie Kluver Referring Jewelia Bocchino: Treating  Elverta Dimiceli/Extender: Venia Minks in Treatment: 6 Clinic Level of Care Assessment Items TOOL 1 Quantity Score '[]'$  - 0 Use when EandM and Procedure is performed on INITIAL visit ASSESSMENTS - Nursing Assessment / Reassessment '[]'$  - 0 General Physical Exam (combine w/ comprehensive assessment (listed just below) when performed on new pt. evals) '[]'$  - 0 Comprehensive Assessment (HX, ROS, Risk Assessments, Wounds Hx, etc.) Kristine, RICKLEFS [name] ([id number]) [id number] Page 2 of 9 ASSESSMENTS - Wound and Skin Assessment / Reassessment '[]'$  - 0 Dermatologic / Skin Assessment (not related to wound area) ASSESSMENTS - Ostomy and/or Continence Assessment and Care '[]'$  - 0 Incontinence Assessment and Management '[]'$  - 0 Ostomy Care Assessment and Management (repouching, etc.) PROCESS - Coordination of Care '[]'$  - 0 Simple Patient / Family Education for ongoing care '[]'$  - 0 Complex (extensive) Patient / Family Education for ongoing care '[]'$  - 0 Staff obtains Programmer, systems, Records, T Results / Process Orders est '[]'$  - 0 Staff telephones HHA, Nursing Homes / Clarify orders / etc '[]'$  - 0 Routine Transfer to another Facility (non-emergent condition) '[]'$  - 0 Routine Hospital Admission (non-emergent condition) '[]'$  - 0 New Admissions / Biomedical engineer / Ordering NPWT Apligraf, etc. , '[]'$  - 0 Emergency Hospital Admission (emergent condition) PROCESS - Special Needs '[]'$  - 0 Pediatric / Minor Patient Management '[]'$  - 0 Isolation Patient Management '[]'$  - 0 Hearing / Language / Visual special needs '[]'$  - 0 Assessment of Community assistance (transportation, D/C planning, etc.) '[]'$  - 0 Additional assistance / Altered mentation '[]'$  - 0 Support Surface(s) Assessment (bed, cushion, seat, etc.) INTERVENTIONS - Miscellaneous '[]'$  - 0 External ear exam '[]'$  - 0 Patient Transfer (multiple staff / Civil Service fast streamer / Similar devices) '[]'$  - 0 Simple Staple / Suture removal (25  or less) '[]'$  - 0 Complex Staple / Suture removal (26 or more) '[]'$  - 0 Hypo/Hyperglycemic Management (do not check if billed separately) '[]'$  - 0 Ankle / Brachial Index (ABI) - do not  check if billed separately Has the patient been seen at the hospital within the last three years: Yes Total Score: 0 Level Of Care: ____ Electronic Signature(s) Signed: 10/09/2022 4:44:06 PM By: Massie Kluver Entered By: Massie Kluver on 10/09/2022 10:24:16 -------------------------------------------------------------------------------- Encounter Discharge Information Details Patient Name: Date of Service: Kristine Mater [name]. 10/09/2022 9:45 A M Medical Record Number: [id number] Patient Account Number: [id number] Date of Birth/Sex: Treating RN: 04/09/1956 (66 y.o. Female) Cornell Barman Primary Care Francisca Langenderfer: Jon Billings Other Clinician: Massie Kluver Referring Kameren Baade: Treating [name]/Extender: Leonides Grills [place], [place] ([id number]) [phone].pdf Page 3 of 9 Weeks in Treatment: 6 Encounter Discharge Information Items Discharge Condition: Stable Ambulatory Status: Ambulatory Discharge Destination: Home Transportation: Private Auto Accompanied By: self Schedule Follow-up Appointment: Yes Clinical Summary of Care: Electronic Signature(s) Signed: 10/09/2022 4:44:06 PM By: Massie Kluver Entered By: Massie Kluver on 10/09/2022 10:30:07 -------------------------------------------------------------------------------- Lower Extremity Assessment Details Patient Name: Date of Service: Kristine Mater [name]. 10/09/2022 9:45 A M Medical Record Number: [id number] Patient Account Number: [id number] Date of Birth/Sex: Treating RN: 07-Sep-1956 (66 y.o. Female) Cornell Barman Primary Care Doyce Stonehouse: Jon Billings Other Clinician: Massie Kluver Referring Trumaine Wimer: Treating Jozey Janco/Extender: Venia Minks in Treatment: 6 Electronic  Signature(s) Signed: 10/09/2022 4:44:06 PM By: Massie Kluver Signed: 10/09/2022 5:11:45 PM By: Gretta Cool, BSN, RN, CWS, [name] RN, BSN Entered By: Massie Kluver on 10/09/2022 10:08:23 -------------------------------------------------------------------------------- Multi Wound Chart Details Patient Name: Date of Service: Kristine Mater [name]. 10/09/2022 9:45 A M Medical Record Number: [id number] Patient Account Number: [id number] Date of Birth/Sex: Treating RN: 1956/09/18 (66 y.o. Female) Cornell Barman Primary Care Jermon Chalfant: Jon Billings Other Clinician: Massie Kluver Referring Keaundre Thelin: Treating Shloimy Michalski/Extender: Venia Minks in Treatment: 6 Vital Signs Height(in): Pulse(bpm): 72 Weight(lbs): Blood Pressure(mmHg): 134/81 Body Mass Index(BMI): Temperature(F): 98.2 Respiratory Rate(breaths/min): 16 [Maden, Shaquana [name] ([id number]):Photos:] [[id number].pdf Page 4 of 9:2 3 N/A N/A] Left, Distal Upper Leg Right Knee N/A Wound Location: Trauma Trauma N/A Wounding Event: Trauma, Other Trauma, Other N/A Primary Etiology: Anemia, Hypertension, Lupus Anemia, Hypertension, Lupus N/A Comorbid History: Erythematosus, Osteoarthritis, Erythematosus, Osteoarthritis, Received Radiation Received Radiation 08/11/2022 08/11/2022 N/A Date Acquired: 6 6 N/A Weeks of Treatment: Open Open N/A Wound Status: No No N/A Wound Recurrence: 4x5.4x0.1 0.5x2x0.1 N/A Measurements L x W x D (cm) 16.965 0.785 N/A A (cm) : rea 1.696 0.079 N/A Volume (cm) : 82.90% 93.30% N/A % Reduction in Area: 99.70% 99.60% N/A % Reduction in Volume: Full Thickness With Exposed Support Full Thickness Without Exposed N/A Classification: Structures Support Structures Medium Large N/A Exudate A mount: Serosanguineous Serosanguineous N/A Exudate Type: red, brown red, brown N/A Exudate Color: N/A Small (1-33%) N/A Granulation A mount: N/A None Present (0%)  N/A Necrotic A mount: Fat Layer (Subcutaneous Tissue): Yes Fat Layer (Subcutaneous Tissue): Yes N/A Exposed Structures: Small (1-33%) None N/A Epithelialization: Treatment Notes Electronic Signature(s) Signed: 10/09/2022 4:44:06 PM By: Massie Kluver Entered By: Massie Kluver on 10/09/2022 10:08:28 -------------------------------------------------------------------------------- [place] Details Patient Name: Date of Service: Kristine Mater [name]. 10/09/2022 9:45 A M Medical Record Number: [id number] Patient Account Number: [id number] Date of Birth/Sex: Treating RN: 1956/10/16 (66 y.o. Female) Cornell Barman Primary Care Tashera Montalvo: Jon Billings Other Clinician: Massie Kluver Referring Behr Cislo: Treating Kace Hartje/Extender: Venia Minks in Treatment: 6 Active Inactive Abuse / Safety / Falls / Self Care Management Nursing Diagnoses: History of Falls Goals: Patient will remain injury free related to falls Date Initiated: 08/24/2022 Target Resolution Date: 08/24/2022 Goal  StatusHedda Slade JEZEBEL, POLLET [name] ([id number]) [id number] Page 5 of 9 Patient/caregiver will verbalize understanding of skin care regimen Date Initiated: 08/24/2022 Target Resolution Date: 08/24/2022 Goal Status: Active Interventions: Assess fall risk on admission and as needed Notes: Orientation to the Wound Care Program Nursing Diagnoses: Knowledge deficit related to the wound healing center program Goals: Patient/caregiver will verbalize understanding of the [place] Program Date Initiated: 08/24/2022 Target Resolution Date: 08/24/2022 Goal Status: Active Interventions: Provide education on orientation to the wound center Notes: Wound/Skin Impairment Nursing Diagnoses: Impaired tissue integrity Knowledge deficit related to ulceration/compromised skin integrity Goals: Patient/caregiver will verbalize understanding of skin  care regimen Date Initiated: 08/24/2022 Target Resolution Date: 08/24/2022 Goal Status: Active Ulcer/skin breakdown will have a volume reduction of 30% by week 4 Date Initiated: 08/24/2022 Target Resolution Date: 09/21/2022 Goal Status: Active Interventions: Assess patient/caregiver ability to obtain necessary supplies Assess ulceration(s) every visit Treatment Activities: Referred to DME Torez Beauregard for dressing supplies : 08/24/2022 Skin care regimen initiated : 08/24/2022 Topical wound management initiated : 08/24/2022 Notes: Electronic Signature(s) Signed: 10/09/2022 4:44:06 PM By: Massie Kluver Signed: 10/09/2022 5:11:45 PM By: Gretta Cool, BSN, RN, CWS, [name] RN, BSN Entered By: Massie Kluver on 10/09/2022 10:29:34 -------------------------------------------------------------------------------- Pain Assessment Details Patient Name: Date of Service: Kristine Mater [name]. 10/09/2022 9:45 A M Medical Record Number: [id number] Patient Account Number: [id number] Date of Birth/Sex: Treating RN: 01/03/1956 (66 y.o. Female) Cornell Barman Primary Care Anmol Fleck: Jon Billings Other Clinician: Massie Kluver Referring Temica Righetti: Treating Tykeisha Peer/Extender: Venia Minks in Treatment: [address], [place] [name] ([id number]) [phone].pdf Page 6 of 9 Active Problems Location of Pain Severity and Description of Pain Patient Has Paino No Site Locations Pain Management and Medication Current Pain Management: Electronic Signature(s) Signed: 10/09/2022 4:44:06 PM By: Massie Kluver Signed: 10/09/2022 5:11:45 PM By: Gretta Cool, BSN, RN, CWS, [name] RN, BSN Entered By: Massie Kluver on 10/09/2022 09:59:15 -------------------------------------------------------------------------------- Patient/Caregiver Education Details Patient Name: Date of Service: Kristine Mater [name]. 12/19/2023andnbsp9:45 [place] Record Number: [id number] Patient Account Number:  [id number] Date of Birth/Gender: Treating RN: 1956-09-19 (66 y.o. Female) Cornell Barman Primary Care Physician: Jon Billings Other Clinician: Massie Kluver Referring Physician: Treating Physician/Extender: Venia Minks in Treatment: 6 Education Assessment Education Provided To: Patient Education Topics Provided Wound/Skin Impairment: Handouts: Other: continue wound care as directed Methods: Explain/Verbal Responses: State content correctly Electronic Signature(s) Signed: 10/09/2022 4:44:06 PM By: Massie Kluver Entered By: Massie Kluver on 10/09/2022 10:29:28 [name] ([id number]) [id number] Page 7 of 9 -------------------------------------------------------------------------------- Wound Assessment Details Patient Name: Date of Service: Kristine Mater [name]. 10/09/2022 9:45 A M Medical Record Number: [id number] Patient Account Number: [id number] Date of Birth/Sex: Treating RN: [birth year] (66 y.o. Female) Cornell Barman Primary Care Oda Placke: Jon Billings Other Clinician: Massie Kluver Referring Taziyah Iannuzzi: Treating Sheccid Lahmann/Extender: Venia Minks in Treatment: 6 Wound Status Wound Number: 2 Primary Trauma, Other Etiology: Wound Location: Left, Distal Upper Leg Wound Open Wounding Event: Trauma Status: Date Acquired: 08/11/2022 Comorbid Anemia, Hypertension, Lupus Erythematosus, Osteoarthritis, Weeks Of Treatment: 6 History: Received Radiation Clustered Wound: No Photos Wound Measurements Length: (cm) 4 Width: (cm) 5.4 Depth: (cm) 0.1 Area: (cm) 16.965 Volume: (cm) 1.696 % Reduction in Area: 82.9% % Reduction in Volume: 99.7% Epithelialization: Small (1-33%) Tunneling: No Undermining: No Wound Description Classification: Full Thickness With Exposed Support S Exudate Amount: Medium Exudate Type: Serosanguineous Exudate Color: red, brown tructures Foul Odor After  Cleansing: No Wound Bed Exposed Structure Fat Layer (Subcutaneous Tissue) Exposed:  Yes Treatment Notes Wound #2 (Upper Leg) Wound Laterality: Left, Distal Cleanser Peri-Wound Care Skin Prep Discharge Instruction: Use skin prep as directed Topical Primary Dressing Hydrofera Blue Ready Transfer Foam, 2.5x2.5 (in/in) SHALIE, SCHREMP [name] ([id number]) [phone].pdf Page 8 of 9 Discharge Instruction: Apply Hydrofera Blue Ready to wound bed as directed Secondary Dressing (BORDER) Zetuvit Plus SILICONE BORDER Dressing 5x5 (in/in) Discharge Instruction: Please do not put silicone bordered dressings under wraps. Use non-bordered dressing only. Secured With [place] H Soft Cloth Surgical T ape ape, 2x2 (in/yd) Compression Wrap Compression Stockings Add-Ons Electronic Signature(s) Signed: 10/09/2022 4:44:06 PM By: Massie Kluver Signed: 10/09/2022 5:11:45 PM By: Gretta Cool, BSN, RN, CWS, [name] RN, BSN Entered By: Massie Kluver on 10/09/2022 10:07:44 -------------------------------------------------------------------------------- Wound Assessment Details Patient Name: Date of Service: Kristine Mater [name]. 10/09/2022 9:45 A M Medical Record Number: [id number] Patient Account Number: [id number] Date of Birth/Sex: Treating RN: [birth year] (66 y.o. Female) Cornell Barman Primary Care Astella Desir: Jon Billings Other Clinician: Massie Kluver Referring Jujhar Everett: Treating Bilaal Leib/Extender: Venia Minks in Treatment: 6 Wound Status Wound Number: 3 Primary Trauma, Other Etiology: Wound Location: Right Knee Wound Open Wounding Event: Trauma Status: Date Acquired: 08/11/2022 Comorbid Anemia, Hypertension, Lupus Erythematosus, Osteoarthritis, Weeks Of Treatment: 6 History: Received Radiation Clustered Wound: No Photos Wound Measurements Length: (cm) 0.5 Width: (cm) 2 Depth: (cm) 0.1 Area: (cm) 0.785 Volume: (cm) 0.079 %  Reduction in Area: 93.3% % Reduction in Volume: 99.6% Epithelialization: None Tunneling: No Undermining: No Wound Description Classification: Full Thickness Without Exposed Support Structures Exudate Amount: Large Exudate Type: Serosanguineous PHILLIS, THACKERAY [name] ([id number]) Exudate Color: red, brown Foul Odor After Cleansing: No Slough/Fibrino Yes [id number].pdf Page 9 of 9 Wound Bed Granulation Amount: Small (1-33%) Exposed Structure Granulation Quality: Hyper-granulation Fat Layer (Subcutaneous Tissue) Exposed: Yes Necrotic Amount: None Present (0%) Treatment Notes Wound #3 (Knee) Wound Laterality: Right Cleanser Peri-Wound Care Topical Primary Dressing Hydrofera Blue Ready Transfer Foam, 4x5 (in/in) Discharge Instruction: Apply Hydrofera Blue Ready to wound bed as directed Secondary Dressing (BORDER) Zetuvit Plus SILICONE BORDER Dressing 4x4 (in/in) Discharge Instruction: Please do not put silicone bordered dressings under wraps. Use non-bordered dressing only. Secured With Compression Wrap Compression Stockings Environmental education officer) Signed: 10/09/2022 4:44:06 PM By: Massie Kluver Signed: 10/09/2022 5:11:45 PM By: Gretta Cool, BSN, RN, CWS, [name] RN, BSN Entered By: Massie Kluver on 10/09/2022 10:08:12 -------------------------------------------------------------------------------- Vitals Details Patient Name: Date of Service: Kristine Mater [name]. 10/09/2022 9:45 A M Medical Record Number: [id number] Patient Account Number: [id number] Date of Birth/Sex: Treating RN: 07-Aug-1956 (66 y.o. Female) Cornell Barman Primary Care Liston Thum: Jon Billings Other Clinician: Massie Kluver Referring Dickie Labarre: Treating [name] Baskerville/Extender: Venia Minks in Treatment: 6 Vital Signs Time Taken: 09:55 Temperature (F): 98.2 Pulse (bpm): 72 Respiratory Rate (breaths/min): 16 Blood Pressure (mmHg): 134/81 Reference Range: 80 -  120 mg / dl Electronic Signature(s) Signed: 10/09/2022 4:44:06 PM By: Massie Kluver Entered By: Massie Kluver on 10/09/2022 09:58:59

## 2022-10-10 DIAGNOSIS — S80812A Abrasion, left lower leg, initial encounter: Secondary | ICD-10-CM | POA: Diagnosis not present

## 2022-10-10 DIAGNOSIS — L97823 Non-pressure chronic ulcer of other part of left lower leg with necrosis of muscle: Secondary | ICD-10-CM | POA: Diagnosis not present

## 2022-10-11 ENCOUNTER — Encounter: Payer: Self-pay | Admitting: Physician Assistant

## 2022-10-11 ENCOUNTER — Ambulatory Visit (INDEPENDENT_AMBULATORY_CARE_PROVIDER_SITE_OTHER): Payer: Medicare Other | Admitting: Physician Assistant

## 2022-10-11 VITALS — BP 133/66 | HR 96 | Temp 97.8°F | Wt 180.5 lb

## 2022-10-11 DIAGNOSIS — D649 Anemia, unspecified: Secondary | ICD-10-CM

## 2022-10-11 DIAGNOSIS — D519 Vitamin B12 deficiency anemia, unspecified: Secondary | ICD-10-CM | POA: Diagnosis not present

## 2022-10-11 NOTE — Assessment & Plan Note (Signed)
Chronic, ongoing  Reviewed CBC and labs from 09/24/22 which demonstrate persistent anemia and elevated B12 Will repeat labs for trending and may need to resume b12 injections in office Recommend continued collaboration with Hematology to assist with correction  Follow up in 6 weeks with PCP for monitoring and management

## 2022-10-11 NOTE — Progress Notes (Signed)
Established Patient Office Visit  Name: [name]   MRN: [id number]    DOB: Feb 28, 1956   Date:10/11/2022  Today's Provider: Talitha Givens, MHS, PA-C Introduced myself to the patient as a PA-C and provided education on APPs in clinical practice.         Subjective  Chief Complaint  Chief Complaint  Patient presents with   Follow-up    HPI  Follow up for B12 and anemia Reports her Hematology provider recently did her labs and B12 was high so they have stopped with injections Reports she is feeling better but she is still having fatigue  States her energy is improving but is still not where it was prior to her dog bite States she is seeing her Wound care provider once per week now       Patient Active Problem List   Diagnosis Date Noted   Hematoma 08/11/2022   Dog bite of lower leg 08/11/2022   Vertigo 01/18/2022   History of Barrett's esophagus    Personal history of colonic polyps    B12 deficiency anemia 04/27/2021   Gallbladder polyp 07/30/2018   Insomnia 06/21/2018   Vitamin D deficiency 06/21/2018   Anxiety 06/21/2018   Depression (emotion) 08/21/2016   Systemic lupus erythematosus ([place]) 08/21/2016   Thrombocytosis 08/13/2015   Symptomatic anemia 11/25/2013   Stage 3a chronic kidney disease (CKD) ([place]) 02/04/2013   Hypertension 02/03/2013   Proteinuria 02/18/2007   Stage III lupus nephritis (WHO) ([place]) 10/22/1986    Past Surgical History:  Procedure Laterality Date   BONE MARROW BIOPSY  01/2021   BREAST BIOPSY Right 07/08/2017   BREAST EXCISIONAL BIOPSY Right 08/06/2017   BREAST EXCISIONAL BIOPSY Right 07/1999   BREAST LUMPECTOMY WITH RADIOACTIVE SEED LOCALIZATION Right 08/06/2017   Procedure: RIGHT BREAST LUMPECTOMY WITH RADIOACTIVE SEED LOCALIZATION [facility] PATHWAY;  Surgeon: Excell Seltzer, MD;  Location: [facility];  Service: General;  Laterality: Right;   BREAST LUMPECTOMY WITH RADIOACTIVE SEED LOCALIZATION Right  08/19/2018   Procedure: RIGHT BREAST LUMPECTOMY [place];  Surgeon: Excell Seltzer, MD;  Location: [facility];  Service: General;  Laterality: Right;   BREAST SURGERY  07/1999   right breast benign fibroadenoma   COLONOSCOPY  2012   COLONOSCOPY WITH PROPOFOL N/A 05/01/2017   Procedure: COLONOSCOPY WITH PROPOFOL;  Surgeon: Christene Lye, MD;  Location: [facility] ENDOSCOPY;  Service: Endoscopy;  Laterality: N/A;   COLONOSCOPY WITH PROPOFOL N/A 12/18/2021   Procedure: COLONOSCOPY WITH PROPOFOL;  Surgeon: Lin Landsman, MD;  Location: [facility] ENDOSCOPY;  Service: Gastroenterology;  Laterality: N/A;   CRYOTHERAPY     abnormal pap smear   ESOPHAGOGASTRODUODENOSCOPY (EGD) WITH PROPOFOL N/A 05/01/2017   Procedure: ESOPHAGOGASTRODUODENOSCOPY (EGD) WITH PROPOFOL;  Surgeon: Christene Lye, MD;  Location: [facility] ENDOSCOPY;  Service: Endoscopy;  Laterality: N/A;   ESOPHAGOGASTRODUODENOSCOPY (EGD) WITH PROPOFOL N/A 12/18/2021   Procedure: ESOPHAGOGASTRODUODENOSCOPY (EGD) WITH PROPOFOL;  Surgeon: Lin Landsman, MD;  Location: [facility] ENDOSCOPY;  Service: Gastroenterology;  Laterality: N/A;   NASAL SEPTUM SURGERY  2007   TUBAL LIGATION  1990    Family History  Problem Relation Age of Onset   Cancer Father        oral cancer   Cancer Maternal Grandmother        ovarian cancer   Hypertension Maternal Grandmother    Diabetes Maternal Grandfather    Diabetes Paternal Aunt    Breast cancer Neg Hx  Social History   Tobacco Use   Smoking status: Never   Smokeless tobacco: Never  Substance Use Topics   Alcohol use: Yes    Alcohol/week: 4.0 - 6.0 standard drinks of alcohol    Types: 4 - 6 Standard drinks or equivalent per week    Comment: few beers on friday and saturday     Current Outpatient Medications:    aspirin EC 81 MG tablet, Take 81 mg by mouth daily. Swallow whole., Disp: , Rfl:    chlorthalidone (HYGROTON) 25 MG tablet, Take 12.5  mg by mouth in the morning., Disp: , Rfl:    Cholecalciferol (VITAMIN D3 PO), Take 1,000 Units by mouth daily., Disp: , Rfl:    clonazePAM (KLONOPIN) 0.5 MG tablet, Take 1 tablet (0.5 mg total) by mouth daily as needed for anxiety., Disp: 20 tablet, Rfl: 0   cyanocobalamin 1000 MCG tablet, Take 1,000 mcg by mouth daily. Vitamin B12, Disp: , Rfl:    enalapril (VASOTEC) 20 MG tablet, Take 0.5 tablets (10 mg total) by mouth 2 (two) times daily., Disp: , Rfl:    Ferrous Sulfate (IRON) 325 (65 Fe) MG TABS, Take 1 tablet by mouth daily., Disp: , Rfl:    fluticasone (FLONASE) 50 MCG/ACT nasal spray, Place 2 sprays into both nostrils daily., Disp: 16 g, Rfl: 6   hydroxychloroquine (PLAQUENIL) 200 MG tablet, Take 200 mg by mouth every other day., Disp: , Rfl:    lipase/protease/amylase (CREON) 36000 UNITS CPEP capsule, Take 2 capsules with the first bite of each meal and 1 capsule with the first bite of each snack, Disp: 240 capsule, Rfl: 2   meclizine (ANTIVERT) 12.5 MG tablet, Take 1 tablet (12.5 mg total) by mouth 3 (three) times daily as needed for dizziness., Disp: 30 tablet, Rfl: 0   traZODone (DESYREL) 50 MG tablet, TAKE 1 TABLET BY MOUTH EVERY DAY AT BEDTIME AS NEEDED FOR SLEEP, Disp: 90 tablet, Rfl: 0  Current Facility-Administered Medications:    cyanocobalamin (VITAMIN B12) injection 1,000 mcg, 1,000 mcg, Intramuscular, Q7 weeks, Jon Billings, NP, 1,000 mcg at 09/17/22 1153   cyanocobalamin (VITAMIN B12) injection 1,000 mcg, 1,000 mcg, Intramuscular, Q30 days, Jon Billings, NP, 1,000 mcg at 09/25/22 1157  Allergies  Allergen Reactions   Elemental Sulfur Hives   Sulfa Antibiotics Hives, Itching and Rash   Beef-Derived Products     Stomach issues   Lambs Yisroel Ramming meat-Stomach issues    Milk-Related Compounds     Stomach issues    I personally reviewed active problem list, medication list, allergies, notes from last encounter, lab results with the patient/caregiver  today.   Review of Systems  Constitutional:  Positive for malaise/fatigue. Negative for chills, diaphoresis and fever.  Eyes:  Negative for blurred vision and double vision.  Cardiovascular:  Negative for chest pain, palpitations and leg swelling.  Neurological:  Negative for dizziness, tingling, weakness and headaches.      Objective  Vitals:   10/11/22 1543  BP: 133/66  Pulse: 96  Temp: 97.8 F (36.6 C)  TempSrc: Oral  SpO2: 98%  Weight: 180 lb 8 oz (81.9 kg)    Body mass index is 28.92 kg/m.  Physical Exam Vitals reviewed.  Constitutional:      General: She is awake.     Appearance: Normal appearance. She is well-developed and well-groomed.  HENT:     Head: Normocephalic and atraumatic.     Mouth/Throat:     Mouth: Mucous membranes are moist.  Pharynx: Oropharynx is clear.  Eyes:     Extraocular Movements: Extraocular movements intact.     Conjunctiva/sclera: Conjunctivae normal.     Pupils: Pupils are equal, round, and reactive to light.  Pulmonary:     Effort: Pulmonary effort is normal.  Neurological:     General: No focal deficit present.     Mental Status: She is alert and oriented to person, place, and time.  Psychiatric:        Mood and Affect: Mood normal.        Behavior: Behavior normal. Behavior is cooperative.        Thought Content: Thought content normal.        Judgment: Judgment normal.      Recent Results (from the past 2160 hour(s))  Urine Culture     Status: None   Collection Time: 07/31/22  2:54 PM   Specimen: Urine   UR  Result Value Ref Range   Urine Culture, Routine Final report    Organism ID, Bacteria Comment     Comment: Mixed urogenital flora 25,000-50,000 colony forming units per mL   Urinalysis, Routine w reflex microscopic     Status: Abnormal   Collection Time: 07/31/22  2:54 PM  Result Value Ref Range   Specific Gravity, UA 1.025 1.005 - 1.030   pH, UA 5.5 5.0 - 7.5   Color, UA Yellow Yellow   Appearance Ur  Clear Clear   Leukocytes,UA 1+ (A) Negative   Protein,UA 1+ (A) Negative/Trace   Glucose, UA Negative Negative   Ketones, UA Negative Negative   RBC, UA Trace (A) Negative   Bilirubin, UA Negative Negative   Urobilinogen, Ur 0.2 0.2 - 1.0 mg/dL   Nitrite, UA Negative Negative   Microscopic Examination See below:   Microscopic Examination     Status: Abnormal   Collection Time: 07/31/22  2:54 PM   Urine  Result Value Ref Range   WBC, UA 11-30 (A) 0 - 5 /hpf   RBC, Urine 0-2 0 - 2 /hpf   Epithelial Cells (non renal) 0-10 0 - 10 /hpf   Bacteria, UA None seen None seen/Few  CBC     Status: Abnormal   Collection Time: 08/11/22  4:46 PM  Result Value Ref Range   WBC 12.4 (H) 4.0 - 10.5 K/uL   RBC 2.89 (L) 3.87 - 5.11 MIL/uL   Hemoglobin 9.4 (L) 12.0 - 15.0 g/dL   HCT 28.2 (L) 36.0 - 46.0 %   MCV 97.6 80.0 - 100.0 fL   MCH 32.5 26.0 - 34.0 pg   MCHC 33.3 30.0 - 36.0 g/dL   RDW 12.8 11.5 - 15.5 %   Platelets 449 (H) 150 - 400 K/uL   nRBC 0.0 0.0 - 0.2 %    Comment: Performed at [facility], [address] [place],  [address]  Basic metabolic panel     Status: Abnormal   Collection Time: 08/11/22  4:46 PM  Result Value Ref Range   Sodium 134 (L) 135 - 145 mmol/L   Potassium 4.2 3.5 - 5.1 mmol/L   Chloride 106 98 - 111 mmol/L   CO2 19 (L) 22 - 32 mmol/L   Glucose, Bld 118 (H) 70 - 99 mg/dL    Comment: Glucose reference range applies only to samples taken after fasting for at least 8 hours.   BUN 30 (H) 8 - 23 mg/dL   Creatinine, Ser 1.47 (H) 0.44 - 1.00 mg/dL   Calcium 8.7 (  L) 8.9 - 10.3 mg/dL   GFR, Estimated 39 (L) >60 mL/min    Comment: (NOTE) Calculated using the CKD-EPI Creatinine Equation (2021)    Anion gap 9 5 - 15    Comment: Performed at [facility], [address] [place], [place] [address]  Protime-INR     Status: None   Collection Time: 08/11/22  4:46 PM  Result Value Ref Range   Prothrombin Time 13.9 11.4 - 15.2 seconds   INR  1.1 0.8 - 1.2    Comment: (NOTE) INR goal varies based on device and disease states. Performed at [facility], [address] [place], [place] [address]   Type and screen [place]     Status: None   Collection Time: 08/11/22  4:46 PM  Result Value Ref Range   ABO/RH(D) O POS    Antibody Screen NEG    Sample Expiration      08/14/2022,2359 Performed at [facility], [address] [place], [place] [address]   CBG monitoring, ED     Status: Abnormal   Collection Time: 08/11/22  4:48 PM  Result Value Ref Range   Glucose-Capillary 112 (H) 70 - 99 mg/dL    Comment: Glucose reference range applies only to samples taken after fasting for at least 8 hours.  Troponin I (High Sensitivity)     Status: Abnormal   Collection Time: 08/11/22  4:53 PM  Result Value Ref Range   Troponin I (High Sensitivity) 20 (H) <18 ng/L    Comment: (NOTE) Elevated high sensitivity troponin I (hsTnI) values and significant  changes across serial measurements may suggest ACS but many other  chronic and acute conditions are known to elevate hsTnI results.  Refer to the "Links" section for chest pain algorithms and additional  guidance. Performed at [facility], [address] [place], [place] [address]   CBC     Status: Abnormal   Collection Time: 08/11/22  8:54 PM  Result Value Ref Range   WBC 17.5 (H) 4.0 - 10.5 K/uL   RBC 2.55 (L) 3.87 - 5.11 MIL/uL   Hemoglobin 8.4 (L) 12.0 - 15.0 g/dL   HCT 25.2 (L) 36.0 - 46.0 %   MCV 98.8 80.0 - 100.0 fL   MCH 32.9 26.0 - 34.0 pg   MCHC 33.3 30.0 - 36.0 g/dL   RDW 12.8 11.5 - 15.5 %   Platelets 408 (H) 150 - 400 K/uL   nRBC 0.0 0.0 - 0.2 %    Comment: Performed at [facility], [place], [place] [address]  Troponin I (High Sensitivity)     Status: Abnormal   Collection Time: 08/11/22  8:54 PM  Result Value Ref Range   Troponin I (High Sensitivity) 37 (H) <18 ng/L    Comment:  (NOTE) Elevated high sensitivity troponin I (hsTnI) values and significant  changes across serial measurements may suggest ACS but many other  chronic and acute conditions are known to elevate hsTnI results.  Refer to the "Links" section for chest pain algorithms and additional  guidance. Performed at [facility], [address] [place], [place] [address]   HIV Antibody (routine testing w rflx)     Status: None   Collection Time: 08/12/22 12:00 AM  Result Value Ref Range   HIV Screen 4th Generation wRfx Non Reactive Non Reactive    Comment: Performed at [facility], [place] [address] [place], [place] [address]  CBC  Status: Abnormal   Collection Time: 08/12/22 12:00 AM  Result Value Ref Range   WBC 14.0 (H) 4.0 - 10.5 K/uL   RBC 2.54 (L) 3.87 - 5.11 MIL/uL   Hemoglobin 8.4 (L) 12.0 - 15.0 g/dL   HCT 25.2 (L) 36.0 - 46.0 %   MCV 99.2 80.0 - 100.0 fL   MCH 33.1 26.0 - 34.0 pg   MCHC 33.3 30.0 - 36.0 g/dL   RDW 12.8 11.5 - 15.5 %   Platelets 406 (H) 150 - 400 K/uL   nRBC 0.0 0.0 - 0.2 %    Comment: Performed at [facility], [place], [place] [address]  Troponin I (High Sensitivity)     Status: Abnormal   Collection Time: 08/12/22 12:00 AM  Result Value Ref Range   Troponin I (High Sensitivity) 30 (H) <18 ng/L    Comment: (NOTE) Elevated high sensitivity troponin I (hsTnI) values and significant  changes across serial measurements may suggest ACS but many other  chronic and acute conditions are known to elevate hsTnI results.  Refer to the "Links" section for chest pain algorithms and additional  guidance. Performed at [facility], [address] [place], [place] [address]   CBC     Status: Abnormal   Collection Time: 08/12/22  6:25 AM  Result Value Ref Range   WBC 11.5 (H) 4.0 - 10.5 K/uL   RBC 2.49 (L) 3.87 - 5.11 MIL/uL   Hemoglobin 8.4 (L) 12.0 - 15.0 g/dL   HCT 24.6 (L) 36.0 - 46.0 %   MCV 98.8 80.0 - 100.0 fL    MCH 33.7 26.0 - 34.0 pg   MCHC 34.1 30.0 - 36.0 g/dL   RDW 13.1 11.5 - 15.5 %   Platelets 394 150 - 400 K/uL   nRBC 0.0 0.0 - 0.2 %    Comment: Performed at [facility], [address], [place], [place] [address]  Basic metabolic panel     Status: Abnormal   Collection Time: 08/12/22  6:25 AM  Result Value Ref Range   Sodium 138 135 - 145 mmol/L   Potassium 4.2 3.5 - 5.1 mmol/L   Chloride 108 98 - 111 mmol/L   CO2 23 22 - 32 mmol/L   Glucose, Bld 105 (H) 70 - 99 mg/dL    Comment: Glucose reference range applies only to samples taken after fasting for at least 8 hours.   BUN 26 (H) 8 - 23 mg/dL   Creatinine, Ser 1.56 (H) 0.44 - 1.00 mg/dL   Calcium 8.6 (L) 8.9 - 10.3 mg/dL   GFR, Estimated 36 (L) >60 mL/min    Comment: (NOTE) Calculated using the CKD-EPI Creatinine Equation (2021)    Anion gap 7 5 - 15    Comment: Performed at [facility], [address] [place], [place] [address]  CBC     Status: Abnormal   Collection Time: 08/12/22 11:07 AM  Result Value Ref Range   WBC 12.9 (H) 4.0 - 10.5 K/uL   RBC 2.45 (L) 3.87 - 5.11 MIL/uL   Hemoglobin 8.1 (L) 12.0 - 15.0 g/dL   HCT 24.0 (L) 36.0 - 46.0 %   MCV 98.0 80.0 - 100.0 fL   MCH 33.1 26.0 - 34.0 pg   MCHC 33.8 30.0 - 36.0 g/dL   RDW 13.0 11.5 - 15.5 %   Platelets 385 150 - 400 K/uL   nRBC 0.0 0.0 - 0.2 %    Comment: Performed at  [facility], [address] [place], [place] [address]  Basic metabolic panel     Status: Abnormal   Collection Time: 08/15/22  4:08 PM  Result Value Ref Range   Glucose 113 (H) 70 - 99 mg/dL   BUN 27 8 - 27 mg/dL   Creatinine, Ser 1.57 (H) 0.57 - 1.00 mg/dL   eGFR 36 (L) >59 mL/min/1.73   BUN/Creatinine Ratio 17 12 - 28   Sodium 137 134 - 144 mmol/L   Potassium 4.5 3.5 - 5.2 mmol/L   Chloride 100 96 - 106 mmol/L   CO2 24 20 - 29 mmol/L   Calcium 9.0 8.7 - 10.3 mg/dL  CBC with Differential/Platelet     Status: Abnormal   Collection Time: 08/15/22  4:08 PM   Result Value Ref Range   WBC 14.3 (H) 3.4 - 10.8 x10E3/uL   RBC 2.39 (LL) 3.77 - 5.28 x10E6/uL   Hemoglobin 8.0 (L) 11.1 - 15.9 g/dL   Hematocrit 23.1 (L) 34.0 - 46.6 %   MCV 97 79 - 97 fL   MCH 33.5 (H) 26.6 - 33.0 pg   MCHC 34.6 31.5 - 35.7 g/dL   RDW 11.9 11.7 - 15.4 %   Platelets 474 (H) 150 - 450 x10E3/uL   Neutrophils 64 Not Estab. %   Lymphs 20 Not Estab. %   Monocytes 15 Not Estab. %   Eos 0 Not Estab. %   Basos 1 Not Estab. %   Neutrophils Absolute 9.1 (H) 1.4 - 7.0 x10E3/uL   Lymphocytes Absolute 2.8 0.7 - 3.1 x10E3/uL   Monocytes Absolute 2.2 (H) 0.1 - 0.9 x10E3/uL   EOS (ABSOLUTE) 0.0 0.0 - 0.4 x10E3/uL   Basophils Absolute 0.1 0.0 - 0.2 x10E3/uL   Immature Granulocytes 0 Not Estab. %   Immature Grans (Abs) 0.0 0.0 - 0.1 x10E3/uL  Iron Binding Cap (TIBC)([facility])     Status: Abnormal   Collection Time: 08/15/22  4:08 PM  Result Value Ref Range   Total Iron Binding Capacity 250 250 - 450 ug/dL   UIBC 237 118 - 369 ug/dL   Iron 13 (L) 27 - 139 ug/dL   Iron Saturation 5 (LL) 15 - 55 %  Ferritin     Status: Abnormal   Collection Time: 08/15/22  4:08 PM  Result Value Ref Range   Ferritin 219 (H) 15 - 150 ng/mL  Iron and TIBC([facility])     Status: None   Collection Time: 08/20/22  2:17 PM  Result Value Ref Range   Iron 44 28 - 170 ug/dL   TIBC 287 250 - 450 ug/dL   Saturation Ratios 15 10.4 - 31.8 %   UIBC 243 ug/dL    Comment: Performed at [facility], [address] [place], [place] [address]  Ferritin     Status: None   Collection Time: 08/20/22  2:18 PM  Result Value Ref Range   Ferritin 145 11 - 307 ng/mL    Comment: Performed at [facility], [address] [place], [place] [address]  Basic metabolic panel     Status: Abnormal   Collection Time: 08/20/22  2:18 PM  Result Value Ref Range   Sodium 134 (L) 135 - 145 mmol/L   Potassium 4.2 3.5 - 5.1 mmol/L   Chloride 105 98 - 111 mmol/L   CO2 24 22 - 32 mmol/L    Glucose, Bld 130 (H) 70 - 99 mg/dL    Comment: Glucose reference range applies only to  samples taken after fasting for at least 8 hours.   BUN 21 8 - 23 mg/dL   Creatinine, Ser 1.44 (H) 0.44 - 1.00 mg/dL   Calcium 8.7 (L) 8.9 - 10.3 mg/dL   GFR, Estimated 40 (L) >60 mL/min    Comment: (NOTE) Calculated using the CKD-EPI Creatinine Equation (2021)    Anion gap 5 5 - 15    Comment: Performed at [facility], [address] [place], [place] [address]  CBC     Status: Abnormal   Collection Time: 08/20/22  2:18 PM  Result Value Ref Range   WBC 10.1 4.0 - 10.5 K/uL   RBC 2.40 (L) 3.87 - 5.11 MIL/uL   Hemoglobin 8.0 (L) 12.0 - 15.0 g/dL   HCT 23.9 (L) 36.0 - 46.0 %   MCV 99.6 80.0 - 100.0 fL   MCH 33.3 26.0 - 34.0 pg   MCHC 33.5 30.0 - 36.0 g/dL   RDW 12.9 11.5 - 15.5 %   Platelets 603 (H) 150 - 400 K/uL   nRBC 0.0 0.0 - 0.2 %    Comment: Performed at [facility], [address] [place], [place] [address]  Lactate dehydrogenase     Status: None   Collection Time: 08/20/22  2:18 PM  Result Value Ref Range   LDH 165 98 - 192 U/L    Comment: Performed at [facility], [address] [place], [place] [address]  Vitamin B12     Status: None   Collection Time: 08/20/22  2:18 PM  Result Value Ref Range   Vitamin B-12 874 180 - 914 pg/mL    Comment: (NOTE) This assay is not validated for testing neonatal or myeloproliferative syndrome specimens for Vitamin B12 levels. Performed at [facility], [place] [address], [place], [place] [address]   Urinalysis, Routine w reflex microscopic     Status: None   Collection Time: 08/21/22  2:55 PM  Result Value Ref Range   Specific Gravity, UA 1.010 1.005 - 1.030   pH, UA 6.0 5.0 - 7.5   Color, UA Yellow Yellow   Appearance Ur Clear Clear   Leukocytes,UA Negative Negative   Protein,UA Negative Negative/Trace   Glucose, UA Negative Negative   Ketones, UA Negative Negative   RBC, UA Negative Negative   Bilirubin, UA  Negative Negative   Urobilinogen, Ur 0.2 0.2 - 1.0 mg/dL   Nitrite, UA Negative Negative   Microscopic Examination Comment     Comment: Microscopic not indicated and not performed.  Comprehensive metabolic panel     Status: Abnormal   Collection Time: 08/21/22  2:57 PM  Result Value Ref Range   Glucose 86 70 - 99 mg/dL   BUN 19 8 - 27 mg/dL   Creatinine, Ser 1.55 (H) 0.57 - 1.00 mg/dL   eGFR 36 (L) >59 mL/min/1.73   BUN/Creatinine Ratio 12 12 - 28   Sodium 135 134 - 144 mmol/L   Potassium 4.9 3.5 - 5.2 mmol/L   Chloride 100 96 - 106 mmol/L   CO2 22 20 - 29 mmol/L   Calcium 9.4 8.7 - 10.3 mg/dL   Total Protein 6.8 6.0 - 8.5 g/dL   Albumin 4.2 3.9 - 4.9 g/dL   Globulin, Total 2.6 1.5 - 4.5 g/dL   Albumin/Globulin Ratio 1.6 1.2 - 2.2   Bilirubin Total 0.4 0.0 - 1.2 mg/dL   Alkaline Phosphatase 85 44 - 121 IU/L   AST 14 0 - 40 IU/L  ALT 7 0 - 32 IU/L  Lipid panel     Status: None   Collection Time: 08/21/22  2:57 PM  Result Value Ref Range   Cholesterol, Total 162 100 - 199 mg/dL   Triglycerides 62 0 - 149 mg/dL   HDL 52 >39 mg/dL   VLDL Cholesterol Cal 12 5 - 40 mg/dL   LDL Chol Calc (NIH) 98 0 - 99 mg/dL   Chol/HDL Ratio 3.1 0.0 - 4.4 ratio    Comment:                                   T. Chol/HDL Ratio                                             Men  Women                               1/2 Avg.Risk  3.4    3.3                                   Avg.Risk  5.0    4.4                                2X Avg.Risk  9.6    7.1                                3X Avg.Risk 23.4   11.0   TSH     Status: None   Collection Time: 08/21/22  2:57 PM  Result Value Ref Range   TSH 4.380 0.450 - 4.500 uIU/mL  Aerobic Culture w Gram Stain (superficial specimen)     Status: None   Collection Time: 08/24/22  9:30 AM   Specimen: Wound  Result Value Ref Range   Specimen Description      WOUND Performed at [facility], [address], [place], [place] [address]    Special  Requests      LEFT LEG Performed at [facility], [address], [place], [place] [address]    Gram Stain      NO WBC SEEN NO ORGANISMS SEEN Performed at [facility], [address] [address], [place], [place] [address]    Culture FEW PSEUDOMONAS AERUGINOSA    Report Status 08/27/2022 FINAL    Organism ID, Bacteria PSEUDOMONAS AERUGINOSA       Susceptibility   Pseudomonas aeruginosa - MIC*    CEFTAZIDIME 2 SENSITIVE Sensitive     CIPROFLOXACIN <=0.25 SENSITIVE Sensitive     GENTAMICIN <=1 SENSITIVE Sensitive     IMIPENEM 2 SENSITIVE Sensitive     PIP/TAZO <=4 SENSITIVE Sensitive     * FEW PSEUDOMONAS AERUGINOSA  CBC w/Diff     Status: Abnormal   Collection Time: 09/06/22 11:31 AM  Result Value Ref Range   WBC 11.0 (H) 3.4 - 10.8 x10E3/uL   RBC 2.58 (LL) 3.77 - 5.28 x10E6/uL   Hemoglobin 8.6 (L) 11.1 - 15.9 g/dL   Hematocrit 25.2 (L) 34.0 - 46.6 %  MCV 98 (H) 79 - 97 fL   MCH 33.3 (H) 26.6 - 33.0 pg   MCHC 34.1 31.5 - 35.7 g/dL   RDW 11.9 11.7 - 15.4 %   Platelets 758 (H) 150 - 450 x10E3/uL   Neutrophils 88 Not Estab. %   Lymphs 5 Not Estab. %   Monocytes 6 Not Estab. %   Eos 0 Not Estab. %   Basos 1 Not Estab. %   Neutrophils Absolute 9.7 (H) 1.4 - 7.0 x10E3/uL   Lymphocytes Absolute 0.6 (L) 0.7 - 3.1 x10E3/uL   Monocytes Absolute 0.6 0.1 - 0.9 x10E3/uL   EOS (ABSOLUTE) 0.0 0.0 - 0.4 x10E3/uL   Basophils Absolute 0.1 0.0 - 0.2 x10E3/uL   Immature Granulocytes 0 Not Estab. %   Immature Grans (Abs) 0.0 0.0 - 0.1 x10E3/uL  Vitamin B12     Status: Abnormal   Collection Time: 09/24/22  9:34 AM  Result Value Ref Range   Vitamin B-12 1,361 (H) 180 - 914 pg/mL    Comment: (NOTE) This assay is not validated for testing neonatal or myeloproliferative syndrome specimens for Vitamin B12 levels. Performed at [facility], [place] [address] [place], [place] [address]   Lactate dehydrogenase     Status: None   Collection Time: 09/24/22  9:34 AM  Result Value Ref  Range   LDH 124 98 - 192 U/L    Comment: Performed at [facility], [address] [place], [place] [address]  Ferritin     Status: None   Collection Time: 09/24/22  9:34 AM  Result Value Ref Range   Ferritin 139 11 - 307 ng/mL    Comment: Performed at [facility], [address] [place], [place] [address]  Iron and TIBC     Status: None   Collection Time: 09/24/22  9:34 AM  Result Value Ref Range   Iron 87 28 - 170 ug/dL   TIBC 286 250 - 450 ug/dL   Saturation Ratios 31 10.4 - 31.8 %   UIBC 199 ug/dL    Comment: Performed at [facility], [address] [place], [place] [address]  Basic metabolic panel     Status: Abnormal   Collection Time: 09/24/22  9:34 AM  Result Value Ref Range   Sodium 137 135 - 145 mmol/L   Potassium 4.4 3.5 - 5.1 mmol/L   Chloride 105 98 - 111 mmol/L   CO2 23 22 - 32 mmol/L   Glucose, Bld 102 (H) 70 - 99 mg/dL    Comment: Glucose reference range applies only to samples taken after fasting for at least 8 hours.   BUN 27 (H) 8 - 23 mg/dL   Creatinine, Ser 1.52 (H) 0.44 - 1.00 mg/dL   Calcium 8.9 8.9 - 10.3 mg/dL   GFR, Estimated 37 (L) >60 mL/min    Comment: (NOTE) Calculated using the CKD-EPI Creatinine Equation (2021)    Anion gap 9 5 - 15    Comment: Performed at [facility], [address] [place], [place] [address]  CBC with Differential/Platelet     Status: Abnormal   Collection Time: 09/24/22  9:34 AM  Result Value Ref Range   WBC 7.0 4.0 - 10.5 K/uL   RBC 2.62 (L) 3.87 - 5.11 MIL/uL   Hemoglobin 8.5 (L) 12.0 - 15.0 g/dL   HCT 25.6 (L) 36.0 - 46.0 %   MCV 97.7 80.0 - 100.0 fL   MCH 32.4 26.0 - 34.0 pg  MCHC 33.2 30.0 - 36.0 g/dL   RDW 13.8 11.5 - 15.5 %   Platelets 602 (H) 150 - 400 K/uL   nRBC 0.0 0.0 - 0.2 %   Neutrophils Relative % 57 %   Neutro Abs 4.0 1.7 - 7.7 K/uL   Lymphocytes Relative 25 %   Lymphs Abs 1.8 0.7 - 4.0 K/uL   Monocytes Relative 14 %   Monocytes Absolute 1.0 0.1 - 1.0 K/uL    Eosinophils Relative 3 %   Eosinophils Absolute 0.2 0.0 - 0.5 K/uL   Basophils Relative 1 %   Basophils Absolute 0.1 0.0 - 0.1 K/uL   Immature Granulocytes 0 %   Abs Immature Granulocytes 0.02 0.00 - 0.07 K/uL    Comment: Performed at [facility], [address] [place], [place] [address]     PHQ2/9:    08/15/2022    3:27 PM 07/31/2022    3:39 PM 03/01/2022    1:59 PM 01/18/2022    2:56 PM 11/06/2021    8:38 AM  Depression screen PHQ 2/9  Decreased Interest _0 0  Down, Depressed, Hopeless _1 PHQ - 2 Score _2 Altered sleeping _3 Tired, decreased energy _4 Change in appetite _5 Feeling bad or failure about yourself  _6 0   Trouble concentrating 1 0 0 1   Moving slowly or fidgety/restless 1 0 1 1   Suicidal thoughts 0  0 0   PHQ-9 Score _7 Difficult doing work/chores Somewhat difficult Somewhat difficult Not difficult at all Somewhat difficult       Fall Risk:    10/11/2022    3:51 PM 08/15/2022    3:26 PM 07/31/2022    3:38 PM 03/01/2022    1:59 PM 01/18/2022    2:56 PM  [place] in the past year? 0 _8 Number falls in past yr: 0 _9 Injury with Fall? 0 _10 Risk for fall due to : No Fall Risks History of fall(s) History of fall(s) History of fall(s) History of fall(s)  Follow up Falls evaluation completed Falls evaluation completed Falls evaluation completed Falls evaluation completed       Functional Status Survey:      Assessment & Plan  Problem List Items Addressed This Visit       Other   Symptomatic anemia   Relevant Orders   CBC w/Diff   B12 deficiency anemia - Primary    Chronic, ongoing  Reviewed CBC and labs from 09/24/22 which demonstrate persistent anemia and elevated B12 Will repeat labs for trending and may need to resume b12 injections in office Recommend continued collaboration with Hematology to assist with correction  Follow up in 6 weeks with PCP  for monitoring and management       Relevant Orders   CBC w/Diff   B12   Iron, TIBC and Ferritin Panel     Return in about 6 weeks (around 11/22/2022) for Follow up for anemia, wound management .   I, Zandyr Barnhill E Viet Kemmerer, PA-C, have reviewed all documentation for this visit. The documentation on 10/11/22 for the exam, diagnosis, procedures, and orders are all accurate and complete.   Talitha Givens, MHS, PA-C [facility]

## 2022-10-12 LAB — CBC WITH DIFFERENTIAL/PLATELET
Basophils Absolute: 0.1 10*3/uL (ref 0.0–0.2)
Basos: 1 %
EOS (ABSOLUTE): 0.2 10*3/uL (ref 0.0–0.4)
Eos: 2 %
Hematocrit: 28.4 % — ABNORMAL LOW (ref 34.0–46.6)
Hemoglobin: 9.4 g/dL — ABNORMAL LOW (ref 11.1–15.9)
Immature Grans (Abs): 0 10*3/uL (ref 0.0–0.1)
Immature Granulocytes: 0 %
Lymphocytes Absolute: 2.8 10*3/uL (ref 0.7–3.1)
Lymphs: 28 %
MCH: 32 pg (ref 26.6–33.0)
MCHC: 33.1 g/dL (ref 31.5–35.7)
MCV: 97 fL (ref 79–97)
Monocytes Absolute: 1.1 10*3/uL — ABNORMAL HIGH (ref 0.1–0.9)
Monocytes: 11 %
Neutrophils Absolute: 5.8 10*3/uL (ref 1.4–7.0)
Neutrophils: 58 %
Platelets: 604 10*3/uL — ABNORMAL HIGH (ref 150–450)
RBC: 2.94 x10E6/uL — ABNORMAL LOW (ref 3.77–5.28)
RDW: 13.1 % (ref 11.7–15.4)
WBC: 10.1 10*3/uL (ref 3.4–10.8)

## 2022-10-12 LAB — IRON,TIBC AND FERRITIN PANEL
Ferritin: 117 ng/mL (ref 15–150)
Iron Saturation: 21 % (ref 15–55)
Iron: 67 ug/dL (ref 27–139)
Total Iron Binding Capacity: 321 ug/dL (ref 250–450)
UIBC: 254 ug/dL (ref 118–369)

## 2022-10-12 LAB — VITAMIN B12: Vitamin B-12: 1472 pg/mL — ABNORMAL HIGH (ref 232–1245)

## 2022-10-16 ENCOUNTER — Encounter: Payer: Medicare Other | Admitting: Internal Medicine

## 2022-10-16 DIAGNOSIS — I10 Essential (primary) hypertension: Secondary | ICD-10-CM | POA: Diagnosis not present

## 2022-10-16 DIAGNOSIS — L97823 Non-pressure chronic ulcer of other part of left lower leg with necrosis of muscle: Secondary | ICD-10-CM | POA: Diagnosis not present

## 2022-10-16 DIAGNOSIS — M1990 Unspecified osteoarthritis, unspecified site: Secondary | ICD-10-CM | POA: Diagnosis not present

## 2022-10-16 DIAGNOSIS — L03116 Cellulitis of left lower limb: Secondary | ICD-10-CM | POA: Diagnosis not present

## 2022-10-16 DIAGNOSIS — S71102A Unspecified open wound, left thigh, initial encounter: Secondary | ICD-10-CM | POA: Diagnosis not present

## 2022-10-16 DIAGNOSIS — S80812A Abrasion, left lower leg, initial encounter: Secondary | ICD-10-CM | POA: Diagnosis not present

## 2022-10-16 DIAGNOSIS — D513 Other dietary vitamin B12 deficiency anemia: Secondary | ICD-10-CM | POA: Diagnosis not present

## 2022-10-16 DIAGNOSIS — M328 Other forms of systemic lupus erythematosus: Secondary | ICD-10-CM | POA: Diagnosis not present

## 2022-10-16 DIAGNOSIS — S81001A Unspecified open wound, right knee, initial encounter: Secondary | ICD-10-CM | POA: Diagnosis not present

## 2022-10-16 DIAGNOSIS — S8012XA Contusion of left lower leg, initial encounter: Secondary | ICD-10-CM | POA: Diagnosis not present

## 2022-10-16 DIAGNOSIS — T792XXA Traumatic secondary and recurrent hemorrhage and seroma, initial encounter: Secondary | ICD-10-CM | POA: Diagnosis not present

## 2022-10-16 DIAGNOSIS — L98492 Non-pressure chronic ulcer of skin of other sites with fat layer exposed: Secondary | ICD-10-CM | POA: Diagnosis not present

## 2022-10-16 NOTE — Progress Notes (Signed)
LATIVIA, VELIE [name] ([id number]) [id number] Page 1 of 10 Visit Report for 10/16/2022 Arrival Information Details Patient Name: Date of Service: [name]. 10/16/2022 7:30 A M Medical Record Number: [id number] Patient Account Number: [id number] Date of Birth/Sex: Treating RN: [birth year] (66 y.o. [name] Primary Care Devona Holmes: Jon Billings Other Clinician: Referring Kyleah Pensabene: Treating Kennede Lusk/Extender: RO BSO N, MICHA EL Juanell Fairly in Treatment: 7 Visit Information History Since Last Visit Added or deleted any medications: No Patient Arrived: Ambulatory Has Dressing in Place as Prescribed: Yes Arrival Time: 07:47 Pain Present Now: No Accompanied By: self Transfer Assistance: None Patient Identification Verified: Yes Secondary Verification Process Completed: Yes Patient Requires Transmission-Based Precautions: No Patient Has Alerts: Yes Patient Alerts: NOT Diabetic Electronic Signature(s) Signed: 10/16/2022 5:01:49 PM By: Gretta Cool, BSN, RN, CWS, [name] RN, BSN Entered By: Gretta Cool, BSN, RN, CWS, [name] on 10/16/2022 07:47:52 -------------------------------------------------------------------------------- Clinic Level of Care Assessment Details Patient Name: Date of Service: [name]. 10/16/2022 7:30 A M Medical Record Number: [id number] Patient Account Number: [id number] Date of Birth/Sex: Treating RN: Jun 09, 1956 (66 y.o. [name] Primary Care Merritt Kibby: Jon Billings Other Clinician: Referring Jisselle Poth: Treating Arnecia Ector/Extender: RO BSO N, MICHA EL Juanell Fairly in Treatment: 7 Clinic Level of Care Assessment Items TOOL 4 Quantity Score _0  - 0 Use when only an EandM is performed on FOLLOW-UP visit ASSESSMENTS - Nursing Assessment / Reassessment _1  - 0 Reassessment of Co-morbidities (includes updates in patient status) _2  - 0 Reassessment of Adherence to Treatment Plan ASSESSMENTS - Wound  and Skin A ssessment / Reassessment _3  - 0 Simple Wound Assessment / Reassessment - one wound [name], [name] ([id number]) [id number] Page 2 of 10 X- 2 5 Complex Wound Assessment / Reassessment - multiple wounds _4  - 0 Dermatologic / Skin Assessment (not related to wound area) ASSESSMENTS - Focused Assessment _5  - 0 Circumferential Edema Measurements - multi extremities _6  - 0 Nutritional Assessment / Counseling / Intervention _7  - 0 Lower Extremity Assessment (monofilament, tuning fork, pulses) _8  - 0 Peripheral Arterial Disease Assessment (using hand held doppler) ASSESSMENTS - Ostomy and/or Continence Assessment and Care _9  - 0 Incontinence Assessment and Management _10  - 0 Ostomy Care Assessment and Management (repouching, etc.) PROCESS - Coordination of Care X - Simple Patient / Family Education for ongoing care 1 15 _11  - 0 Complex (extensive) Patient / Family Education for ongoing care X- 1 10 Staff obtains Programmer, systems, Records, T Results / Process Orders est _12  - 0 Staff telephones HHA, Nursing Homes / Clarify orders / etc _13  - 0 Routine Transfer to another Facility (non-emergent condition) _14  - 0 Routine Hospital Admission (non-emergent condition) _15  - 0 New Admissions / Biomedical engineer / Ordering NPWT Apligraf, etc. , _16  - 0 Emergency Hospital Admission (emergent condition) X- 1 10 Simple Discharge Coordination _17  - 0 Complex (extensive) Discharge Coordination PROCESS - Special Needs _18  - 0 Pediatric / Minor Patient Management _19  - 0 Isolation Patient Management _20  - 0 Hearing / Language / Visual special needs _21  - 0 Assessment of Community assistance (transportation, D/C planning, etc.) _22  - 0 Additional assistance / Altered mentation _23  - 0 Support Surface(s) Assessment (bed, cushion, seat, etc.) INTERVENTIONS - Wound Cleansing / Measurement _24  - 0 Simple Wound Cleansing - one wound X- 2 5 Complex Wound  Cleansing - multiple wounds X- 1 5 Wound Imaging (photographs - any number of wounds) _25  - 0 Wound Tracing (instead of photographs) _26  - 0 Simple Wound  Measurement - one wound X- 2 5 Complex Wound Measurement - multiple wounds INTERVENTIONS - Wound Dressings _0  - 0 Small Wound Dressing one or multiple wounds X- 2 15 Medium Wound Dressing one or multiple wounds _1  - 0 Large Wound Dressing one or multiple wounds <ITGPQDIYMEBRAXEN>_4<\/MHWKGSUPJSRPRXYV>_8  - 0 Application of Medications - topical <PFYTWKMQKMMNOTRR>_1<\/HAFBXUXYBFXOVANV>_9  - 0 Application of Medications - injection INTERVENTIONS - Miscellaneous _4  - 0 External ear exam _5  - 0 Specimen Collection (cultures, biopsies, blood, body fluids, etc.) _6  - 0 Specimen(s) / Culture(s) sent or taken to Lab for analysis [name], [name] ([id number]) [id number] Page 3 of 10 _7  - 0 Patient Transfer (multiple staff / Civil Service fast streamer / Similar devices) _8  - 0 Simple Staple / Suture removal (25 or less) _9  - 0 Complex Staple / Suture removal (26 or more) _10  - 0 Hypo / Hyperglycemic Management (close monitor of Blood Glucose) _11  - 0 Ankle / Brachial Index (ABI) - do not check if billed separately X- 1 5 Vital Signs Has the patient been seen at the hospital within the last three years: Yes Total Score: 105 Level Of Care: New/Established - Level 3 Electronic Signature(s) Signed: 10/16/2022 5:01:49 PM By: Gretta Cool, BSN, RN, CWS, [name] RN, BSN Entered By: Gretta Cool, BSN, RN, CWS, [name] on 10/16/2022 08:11:14 -------------------------------------------------------------------------------- Encounter Discharge Information Details Patient Name: Date of Service: [name]. 10/16/2022 7:30 A M Medical Record Number: [id number] Patient Account Number: [id number] Date of Birth/Sex: Treating RN: 1956/08/30 (66 y.o. [name] Primary Care Faylene Allerton: Jon Billings Other Clinician: Referring Marlet Korte: Treating Joette Schmoker/Extender: RO BSO N, MICHA EL Blanche [place], Areatha Keas in  Treatment: 7 Encounter Discharge Information Items Discharge Condition: Stable Ambulatory Status: Ambulatory Discharge Destination: Home Transportation: Private Auto Schedule Follow-up Appointment: Yes Clinical Summary of Care: Electronic Signature(s) Signed: 10/16/2022 5:01:49 PM By: Gretta Cool, BSN, RN, CWS, [name] RN, BSN Entered By: Gretta Cool, BSN, RN, CWS, [name] on 10/16/2022 08:14:05 -------------------------------------------------------------------------------- Lower Extremity Assessment Details Patient Name: Date of Service: [name]. 10/16/2022 7:30 A M Medical Record Number: [id number] Patient Account Number: [id number] Date of Birth/Sex: Treating RN: 1956/07/20 (66 y.o. [name] Primary Care Khilee Hendricksen: Jon Billings Other Clinician: Referring Jp Eastham: Treating Kena Limon/Extender: [address], [place], [name], [place] [name] ([id number]) [id number] Page 4 of 10 Weeks in Treatment: 7 Edema Assessment Assessed: [Left: Yes] [Right: Yes] [Left: Edema] [Right: :] Vascular Assessment Pulses: Dorsalis Pedis Palpable: [Left:Yes] [Right:Yes] Electronic Signature(s) Signed: 10/16/2022 5:01:49 PM By: Gretta Cool, BSN, RN, CWS, [name] RN, BSN Entered By: Gretta Cool, BSN, RN, CWS, [name] on 10/16/2022 07:59:37 -------------------------------------------------------------------------------- Multi Wound Chart Details Patient Name: Date of Service: [name]. 10/16/2022 7:30 A M Medical Record Number: [id number] Patient Account Number: [id number] Date of Birth/Sex: Treating RN: [birth year] (66 y.o. [name] Primary Care Celestino Ackerman: Jon Billings Other Clinician: Referring Mikaelah Trostle: Treating Yossi Hinchman/Extender: RO BSO N, MICHA EL Blanche [place], Areatha Keas in Treatment: 7 Vital Signs Height(in): 66 Pulse(bpm): 8 Weight(lbs): 180 Blood Pressure(mmHg): 139/68 Body Mass Index(BMI): 29 Temperature(F): 97.7 Respiratory  Rate(breaths/min): 16 [2:Photos:] [N/A:N/A] Left, Distal Upper Leg Right Knee N/A Wound Location: Trauma Trauma N/A Wounding Event: Trauma, Other Trauma, Other N/A Primary Etiology: Anemia, Hypertension, Lupus Anemia, Hypertension, Lupus N/A Comorbid History: Erythematosus, Osteoarthritis, Erythematosus, Osteoarthritis, Received Radiation Received Radiation 08/11/2022 08/11/2022 N/A Date Acquired: 7 7 N/A Weeks of Treatment: Open Open N/A Wound Status: No No N/A Wound Recurrence: 2.4x3.5x0.1 0.1x0.5x0.1 N/A Measurements L x W x D (cm) 6.597 0.039 N/A A (  cm) : rea 0.66 0.004 N/A Volume (cm) : 93.30% 99.70% N/A % Reduction in Area: 99.90% 100.00% N/A % Reduction in Volume: Full Thickness With Exposed Support Full Thickness Without Exposed N/A Classification: [address] [place], [place] [name] ([id number]) [id number] Page 5 of 10 Medium Large N/A Exudate A mount: Sanguinous Serosanguineous N/A Exudate Type: red red, brown N/A Exudate Color: Large (67-100%) Small (1-33%) N/A Granulation A mount: Red Hyper-granulation N/A Granulation Quality: None Present (0%) None Present (0%) N/A Necrotic A mount: Fat Layer (Subcutaneous Tissue): Yes Fat Layer (Subcutaneous Tissue): Yes N/A Exposed Structures: Medium (34-66%) None N/A Epithelialization: Treatment Notes Electronic Signature(s) Signed: 10/16/2022 5:01:49 PM By: Gretta Cool, BSN, RN, CWS, [name] RN, BSN Entered By: Gretta Cool, BSN, RN, CWS, [name] on 10/16/2022 08:03:50 -------------------------------------------------------------------------------- Multi-Disciplinary Care Plan Details Patient Name: Date of Service: [name]. 10/16/2022 7:30 A M Medical Record Number: [id number] Patient Account Number: [id number] Date of Birth/Sex: Treating RN: 06-09-1956 (66 y.o. [name] Primary Care Radonna Bracher: Jon Billings Other Clinician: Referring Jilliann Subramanian: Treating  Kathye Cipriani/Extender: RO BSO N, MICHA EL Blanche [place], Areatha Keas in Treatment: 7 Active Inactive Wound/Skin Impairment Nursing Diagnoses: Impaired tissue integrity Knowledge deficit related to ulceration/compromised skin integrity Goals: Patient/caregiver will verbalize understanding of skin care regimen Date Initiated: 08/24/2022 Target Resolution Date: 08/24/2022 Goal Status: Active Ulcer/skin breakdown will have a volume reduction of 30% by week 4 Date Initiated: 08/24/2022 Date Inactivated: 10/16/2022 Target Resolution Date: 09/21/2022 Goal Status: Met Ulcer/skin breakdown will have a volume reduction of 50% by week 8 Date Initiated: 10/16/2022 Target Resolution Date: 10/23/2022 Goal Status: Active Interventions: Assess patient/caregiver ability to obtain necessary supplies Assess ulceration(s) every visit Treatment Activities: Referred to DME Avary Eichenberger for dressing supplies : 08/24/2022 Skin care regimen initiated : 08/24/2022 Topical wound management initiated : 08/24/2022 Notes: Electronic Signature(s) Signed: 10/16/2022 5:01:49 PM By: Gretta Cool, BSN, RN, CWS, [name] RN, BSN Entered By: Gretta Cool, BSN, RN, CWS, [name] on 10/16/2022 08:12:49 [name] ([id number]) [id number] Page 6 of 10 -------------------------------------------------------------------------------- Pain Assessment Details Patient Name: Date of Service: [name]. 10/16/2022 7:30 A M Medical Record Number: [id number] Patient Account Number: [id number] Date of Birth/Sex: Treating RN: 1955/12/24 (66 y.o. [name] Primary Care Jaquille Kau: Jon Billings Other Clinician: Referring Wm Sahagun: Treating Shawntavia [name]/Extender: RO BSO N, MICHA EL Juanell Fairly in Treatment: 7 Active Problems Location of Pain Severity and Description of Pain Patient Has Paino No Site Locations Pain Management and Medication Current Pain Management: Notes Patient denies pain at this  time. Electronic Signature(s) Signed: 10/16/2022 5:01:49 PM By: Gretta Cool, BSN, RN, CWS, [name] RN, BSN Entered By: Gretta Cool, BSN, RN, CWS, [name] on 10/16/2022 07:49:10 -------------------------------------------------------------------------------- Patient/Caregiver Education Details Patient Name: Date of Service: [name]. 12/26/2023andnbsp7:30 A M Medical Record Number: [id number] Patient Account Number: [id number] Date of Birth/Gender: Treating RN: Jul 11, 1956 (66 y.o. [name] [place], [place] [name] ([id number]) [id number] Page 7 of 10 Primary Care Physician: Jon Billings Other Clinician: Referring Physician: Treating Physician/Extender: RO BSO N, MICHA EL Juanell Fairly in Treatment: 7 Education Assessment Education Provided To: Patient Education Topics Provided Wound/Skin Impairment: Handouts: Caring for Your Ulcer Methods: Demonstration, Explain/Verbal Responses: State content correctly [employer]) Signed: 10/16/2022 5:01:49 PM By: Gretta Cool, BSN, RN, CWS, [name] RN, BSN Entered By: Gretta Cool, BSN, RN, CWS, [name] on 10/16/2022 08:11:55 -------------------------------------------------------------------------------- Wound Assessment Details Patient Name: Date of Service: [name]. 10/16/2022 7:30 A M Medical Record Number: [id number] Patient Account Number: [id number] Date  of Birth/Sex: Treating RN: [birth year] (66 y.o. [name], [name] Primary Care Neldon Shepard: Jon Billings Other Clinician: Referring Steel Kerney: Treating Bray Vickerman/Extender: RO BSO N, MICHA EL Bl[place], Areatha Keas in Treatment: 7 Wound Status Wound Number: 2 Primary Trauma, Other Etiology: Wound Location: Left, Distal Upper Leg Wound Open Wounding Event: Trauma Status: Date Acquired: 08/11/2022 Comorbid Anemia, Hypertension, Lupus Erythematosus, Osteoarthritis, Weeks Of Treatment: 7 History: Received Radiation Clustered Wound:  No Photos Wound Measurements Length: (cm) 2.4 Width: (cm) 3.5 Depth: (cm) 0.1 Area: (cm) 6.597 Volume: (cm) 0.66 [name], [name] ([id number]) Wound Description Classification: Full Thickness With Exposed Suppo Exudate Amount: Medium Exudate Type: Sanguinous Exudate Color: red rt Structures Foul Odor After Cleansing: % Reduction in Area: 93.3% % Reduction in Volume: 99.9% Epithelialization: Medium (34-66%) Tunneling: No Undermining: No [id number] Page 8 of 10 No Wound Bed Granulation Amount: Large (67-100%) Exposed Structure Granulation Quality: Red Fat Layer (Subcutaneous Tissue) Exposed: Yes Necrotic Amount: None Present (0%) Treatment Notes Wound #2 (Upper Leg) Wound Laterality: Left, Distal Cleanser Peri-Wound Care Skin Prep Discharge Instruction: Use skin prep as directed Topical Primary Dressing Hydrofera Blue Ready Transfer Foam, 2.5x2.5 (in/in) Discharge Instruction: Apply Hydrofera Blue Ready to wound bed as directed Secondary Dressing (BORDER) Zetuvit Plus SILICONE BORDER Dressing 5x5 (in/in) Discharge Instruction: Please do not put silicone bordered dressings under wraps. Use non-bordered dressing only. Secured With Compression Wrap Compression Stockings Environmental education officer) Signed: 10/16/2022 5:01:49 PM By: Gretta Cool, BSN, RN, CWS, [name] RN, BSN Entered By: Gretta Cool, BSN, RN, CWS, [name] on 10/16/2022 08:03:33 -------------------------------------------------------------------------------- Wound Assessment Details Patient Name: Date of Service: [name]. 10/16/2022 7:30 A M Medical Record Number: [id number] Patient Account Number: [id number] Date of Birth/Sex: Treating RN: 16-Jun-1956 (66 y.o. [name] Primary Care Aoki Wedemeyer: Jon Billings Other Clinician: Referring Emmely Bittinger: Treating Hervey Wedig/Extender: RO BSO N, MICHA EL Juanell Fairly in Treatment: 7 Wound Status Wound Number: 3  Primary Trauma, Other Etiology: Wound Location: Right Knee Wound Open Wounding Event: Trauma Status: Date Acquired: 08/11/2022 Comorbid Anemia, Hypertension, Lupus Erythematosus, Osteoarthritis, Weeks Of Treatment: 7 History: Received Radiation Clustered Wound: No Photos [name], [name] ([id number]) [id number] Page 9 of 10 Wound Measurements Length: (cm) 0.1 Width: (cm) 0.5 Depth: (cm) 0.1 Area: (cm) 0.039 Volume: (cm) 0.004 % Reduction in Area: 99.7% % Reduction in Volume: 100% Epithelialization: None Tunneling: No Undermining: No Wound Description Classification: Full Thickness Without Exposed Suppor Exudate Amount: Large Exudate Type: Serosanguineous Exudate Color: red, brown t Structures Foul Odor After Cleansing: No Slough/Fibrino Yes Wound Bed Granulation Amount: Small (1-33%) Exposed Structure Granulation Quality: Hyper-granulation Fat Layer (Subcutaneous Tissue) Exposed: Yes Necrotic Amount: None Present (0%) Treatment Notes Wound #3 (Knee) Wound Laterality: Right Cleanser Peri-Wound Care Topical Primary Dressing Hydrofera Blue Ready Transfer Foam, 4x5 (in/in) Discharge Instruction: Apply Hydrofera Blue Ready to wound bed as directed Secondary Dressing (BORDER) Zetuvit Plus SILICONE BORDER Dressing 4x4 (in/in) Discharge Instruction: Please do not put silicone bordered dressings under wraps. Use non-bordered dressing only. Secured With Compression Wrap Compression Stockings Environmental education officer) Signed: 10/16/2022 5:01:49 PM By: Gretta Cool, BSN, RN, CWS, [name] RN, BSN Entered By: Gretta Cool, BSN, RN, CWS, [name] on 10/16/2022 07:59:21 [name], [name] ([id number]) [id number] Page 10 of 10 -------------------------------------------------------------------------------- Vitals Details Patient Name: Date of Service: [name]. 10/16/2022 7:30 A M Medical Record Number: [id number] Patient  Account Number: [id number] Date of Birth/Sex: Treating RN: 1956/06/26 (66 y.o. [name] Primary Care Uday Jantz: Jon Billings Other Clinician: Referring Aemilia Dedrick: Treating Jedaiah Rathbun/Extender: Carolyn Stare  BSO N, MICHA EL G [place], [name] Weeks in Treatment: 7 Vital Signs Time Taken: 07:47 Temperature (F): 97.7 Height (in): 66 Pulse (bpm): 77 Weight (lbs): 180 Respiratory Rate (breaths/min): 16 Body Mass Index (BMI): 29 Blood Pressure (mmHg): 139/68 Reference Range: 80 - 120 mg / dl Electronic Signature(s) Signed: 10/16/2022 5:01:49 PM By: Gretta Cool, BSN, RN, CWS, [name] RN, BSN Entered By: Gretta Cool, BSN, RN, CWS, [name] on 10/16/2022 07:48:56

## 2022-10-16 NOTE — Progress Notes (Signed)
[name], [name] ([id number]) [id number] Page 1 of 7 Visit Report for 10/16/2022 HPI Details Patient Name: Date of Service: [name]. 10/16/2022 7:30 A M Medical Record Number: [id number] Patient Account Number: [id number] Date of Birth/Sex: Treating RN: 1955-11-30 (66 y.o. [name] Primary Care Provider: Jon Billings Other Clinician: Referring Provider: Treating Provider/Extender: RO BSO N, MICHA EL Juanell Fairly in Treatment: 7 History of Present Illness HPI Description: 08-24-2022 patient presents today for initial evaluation here in our clinic concerning issues that she has been having with wounds in 3 locations. 1 areas in the proximal left thigh this is due to a fall that she sustained on 08-11-2022. Subsequently she has then a second issue in the distal thigh as well as the right knee location. These are due to a unusual situation where she was actually leaving had just been petting her grand dog when she walked to her car. She tells me that she got dizzy and stumbled and fell and when she fell she called out. When she did this subsequently her grand dog ran over were not sure if that he thought he was playing with her what but nonetheless ended up biting her on the left thigh as well as on the right knee. Here she had 2 areas of laceration which she subsequently went to the ER for and they attempted to suture both closed. Unfortunately that just did not take. At this point [name] think the sutures can need to be removed from both locations and to be honest there is a significant area of necrosis on the left thigh this may need to be cleaned out and away. Fortunately I do not see any signs of systemic infection though locally there is some evidence of infection she has been on Augmentin which just recently switched to doxycycline no cultures been performed at this point. From the standpoint of past medical history the patient  does have a history of anemia which they believe is related to B12. She does not have diabetes she does have a history of lupus however. She is a former smoker. 11/7; this is a patient who has a severe dog bite injury on her upper anterior left thigh. Also an area on the right medial knee. We have been seeing her daily and using Dakin's wet-to-dry. [name] was shown yesterday the culture of drainage that was done last week that shows Pseudomonas. She has multiple drug interactions with quinolones therefore [name] gave her a third-generation cephalosporin. HOWEVER apparently the pharmacy would not fill the prescription because of the concerns about allergic reaction to the capsule itself related to beef products. [name] could not find this anywhere even on Google however she has taken capsule antibiotics in the past and [name] told her [name] think that this risk is small compared to not treating the Pseudomonas. She has not been systemically unwell she is in some discomfort 09-04-2022 upon evaluation today patient unfortunately he is continuing to have some significant issues here with her leg although I do feel like she is better than she was last week. Fortunately there does not appear to be any evidence of systemic infection though locally we definitely noted Pseudomonas as the causative agent on the left thigh location with a culture that needed from the deep tissue wound area. Subsequently the only active antibiotics that are going to take care of this are Cipro, Levaquin, and Baxdela. Unfortunately the Cipro and Levaquin cannot be taken by her due to the interaction  with Plaquenil causing a QT prolongation which is good to be a significant issue. I do not want that to be a complicating factor and therefore we need to look toward the [place] which [name] understand is definitely expensive but [name] am not sure if there is another option to be perfectly honest. [name] discussed this with the patient today and she is in agreement with  plan we have also made a referral to infectious disease though if we can get this treated with Baxdela then she may not even end up requiring the infectious disease referral. Upon inspection patient's wound bed actually showed signs of 09-11-2022 upon evaluation today patient appears to be doing well currently in regard to her wound. She is actually making progress and we actually did find out today that we did get the [place] approved. This is great news as [name] think it will probably prevent her from having to go to the infectious disease doctors will get the infection under control as it stands right now. She is very pleased to hear this and [name] likewise [name] am extremely pleased. It still can be quite pricey it is good to be $350 with her insurance but that is better than close to 2000 without. 09-17-2022 upon evaluation today patient appears to be doing well currently in regard to her wounds. She has been tolerating the dressing changes without complication. Fortunately I do not see any evidence of infection at this point which is great news we still had a tremendously difficult period of time trying to get the [place] approved and then once we got it approved apparently the pharmacy was unable to actually order the medication. This has been an extremely frustrating process in general. The patient has been extremely frustrated. 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 Nonetheless I do believe that this is still the best way to go with the topical gentamicin. 09-24-2022 upon evaluation today patient appears to be doing well with regard to her wounds she is actually making excellent progress and [name] think we are ready to switch to [facility] for the left leg as well we have already been using  this on the right knee with great results. 10-01-2022 upon evaluation today patient actually appears to be doing excellent. She has been tolerating the [facility] and an excellent fashion without complication and overall [name] am extremely pleased with where we stand today. There does not appear to be any signs of active infection locally nor systemically at this time. No fevers, chills, nausea, vomiting, or diarrhea. 10-09-2022 upon evaluation today patient appears to be doing well currently in regard to her wounds. Both are showing signs of excellent improvement there are some hypergranulation noted at both locations but other than that she really is doing quite well. 12/26; patient continues to have improvement in both wounds. The right medial lower leg almost closed the left upper thigh reduced nicely in dimensions. Using Hydrofera Blue and border foam Electronic Signature(s) Signed: 10/16/2022 4:30:46 PM By: Linton Ham MD [name], Signed: 10/16/2022 4:30:46 PM By: Linton Ham MD [place] [name] ([id number]) [id number] Page 2 of 7 Entered By: Linton Ham on 10/16/2022 08:04:14 -------------------------------------------------------------------------------- Physical Exam Details Patient Name: Date of Service: [name]. 10/16/2022 7:30 A M Medical  Record Number: [id number] Patient Account Number: [id number] Date of Birth/Sex: Treating RN: 12/09/1955 (66 y.o. [name] Primary Care Provider: Jon Billings Other Clinician: Referring Provider: Treating Provider/Extender: RO BSO N, MICHA EL Juanell Fairly in Treatment: 7 Constitutional Sitting or standing Blood Pressure is within target range for patient.. Pulse regular and within target range for patient.Marland Kitchen Respirations regular, non-labored and within target range.. Temperature is normal and within the target range for the patient.Marland Kitchen appears in no distress. Notes Wound exam;  both wounds of contracted in fact the right lower leg is just about healed. Under illumination there is some slough on the left thigh wound however with the improvement in dimensions I do not think debridement is necessary unless for some reason this stalls. No surrounding infection. Nice epithelialization Engineer, maintenance) Signed: 10/16/2022 4:30:46 PM By: Linton Ham MD Entered By: Linton Ham on 10/16/2022 08:05:09 -------------------------------------------------------------------------------- Physician Orders Details Patient Name: Date of Service: [name]. 10/16/2022 7:30 A M Medical Record Number: [id number] Patient Account Number: [id number] Date of Birth/Sex: Treating RN: Aug 08, 1956 (66 y.o. 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 Nurse Visit as needed Edema Control - Lymphedema / Segmental Compressive Device / Other Elevate, Exercise Daily and A void Standing for Long Periods of Time. Elevate legs to the level of the heart and pump ankles as often as  possible Elevate leg(s) parallel to the floor when sitting. Wound Treatment Wound #2 - Upper Leg Wound Laterality: Left, Distal Peri-Wound Care: Skin Prep 3 x Per Week/30 Days Discharge Instructions: Use skin prep as directed Prim Dressing: Hydrofera Blue Ready Transfer Foam, 2.5x2.5 (in/in) (Dispense As Written) 3 x Per Week/30 Days ary Discharge Instructions: Apply Hydrofera Blue Ready to wound bed as directed Secondary Dressing: (BORDER) Zetuvit Plus SILICONE BORDER Dressing 5x5 (in/in) (Dispense As Written) 3 x Per Week/30 Days Discharge Instructions: Please do not put silicone bordered dressings under wraps. Use non-bordered dressing only. Wound #3 - Knee Wound Laterality: Right Prim Dressing: Hydrofera Blue Ready Transfer Foam, 4x5 (in/in) 3 x Per Week/30 Days ary Discharge Instructions: Apply Hydrofera Blue Ready to wound bed as directed Secondary Dressing: (BORDER) Zetuvit Plus SILICONE BORDER Dressing 4x4 (in/in) 3 x Per Week/30 Days Discharge Instructions: Please do not put silicone bordered dressings under wraps. Use non-bordered dressing only. Electronic Signature(s) Signed: 10/16/2022 4:30:46 PM By: Linton Ham MD Signed: 10/16/2022 5:01:49 PM By: Gretta Cool BSN, RN, CWS, [name] RN, BSN Entered By: Gretta Cool, BSN, RN, CWS, [name] on 10/16/2022 08:04:45 -------------------------------------------------------------------------------- Problem List Details Patient Name: Date of Service: [name]. 10/16/2022 7:30 A M Medical Record Number: [id number] Patient Account Number: [id number] Date of Birth/Sex: Treating RN: 1956-09-02 (66 y.o. [name] Primary Care Provider: Jon Billings Other Clinician: Referring Provider: Treating  Provider/Extender: RO BSO N, MICHA EL [place] Weeks in Treatment: 7 Active Problems ICD-10 Encounter Code Description Active Date MDM Diagnosis W54.0XXA Bitten by dog, initial encounter 08/24/2022 No Yes L03.116 Cellulitis of  left lower limb 09/04/2022 No Yes S80.12XA Contusion of left lower leg, initial encounter 08/24/2022 No Yes [name], [name] ([id number]) [id number] Page 4 of 7 T79.2XXA Traumatic secondary and recurrent hemorrhage and seroma, initial encounter 08/24/2022 No Yes S80.812A Abrasion, left lower leg, initial encounter 08/24/2022 No Yes L97.823 Non-pressure chronic ulcer of other part of left lower leg with necrosis of 08/24/2022 No Yes muscle L98.492 Non-pressure chronic ulcer of skin of other sites with fat layer exposed 08/24/2022 No Yes D51.3 Other dietary vitamin B12 deficiency anemia 08/24/2022 No Yes M32.8 Other forms of systemic lupus erythematosus 08/24/2022 No Yes Inactive Problems Resolved Problems Electronic Signature(s) Signed: 10/16/2022 4:30:46 PM By: Linton Ham MD Entered By: Linton Ham on 10/16/2022 08:03:34 -------------------------------------------------------------------------------- Progress Note Details Patient Name: Date of Service: [name]. 10/16/2022 7:30 A M Medical Record Number: [id number] Patient Account Number: [id number] Date of Birth/Sex: Treating RN: Mar 16, 1956 (66 y.o. [name] Primary Care Provider: Jon Billings Other Clinician: Referring Provider: Treating Provider/Extender: RO BSO N, MICHA EL Juanell Fairly in Treatment: 7 Subjective History of Present Illness (HPI) 08-24-2022 patient presents today for initial evaluation here in our clinic concerning issues that she has been having with wounds in 3 locations. 1 areas in the proximal left thigh this is due to a fall that she sustained on 08-11-2022. Subsequently she has then a second issue in the distal thigh as well as the right knee location. These are due to a unusual situation where she was actually leaving had just been petting her grand dog when she walked to her car. She tells me that she got dizzy and stumbled and fell and when  she fell she called out. When she did this subsequently her grand dog ran over were not sure if that he thought he was playing with her what but nonetheless ended up biting her on the left thigh as well as on the right knee. Here she had 2 areas of laceration which she subsequently went to the ER for and they attempted to suture both closed. Unfortunately that just did not take. At this point [name] think the sutures can need to be removed from both locations and to be honest there is a significant area of necrosis on the left thigh this may need to be cleaned out and away. Fortunately I do not see any signs of systemic infection though locally there is some evidence of infection she has been on Augmentin which just recently switched to doxycycline no cultures been performed at this point. From the standpoint of past medical history the patient does have a history of anemia which they believe is related to B12. She does not have diabetes she does have a history of lupus however. She is a former smoker. 11/7; this is a patient who has a severe dog bite injury on her upper anterior left thigh. Also an area on the right medial knee. We have been seeing her daily and using Dakin's wet-to-dry. [name] was shown yesterday the culture of drainage that was done last week that shows Pseudomonas. She has multiple drug interactions with quinolones therefore [name] [name], [name] ([id number]) [id number] Page 5 of 7 gave her a third-generation cephalosporin. HOWEVER apparently the pharmacy would not fill the prescription because of the concerns  about allergic reaction to the capsule itself related to beef products. [name] could not find this anywhere even on Google however she has taken capsule antibiotics in the past and [name] told her [name] think that this risk is small compared to not treating the Pseudomonas. She has not been systemically unwell she is in some discomfort 09-04-2022 upon evaluation today  patient unfortunately he is continuing to have some significant issues here with her leg although I do feel like she is better than she was last week. Fortunately there does not appear to be any evidence of systemic infection though locally we definitely noted Pseudomonas as the causative agent on the left thigh location with a culture that needed from the deep tissue wound area. Subsequently the only active antibiotics that are going to take care of this are Cipro, Levaquin, and Baxdela. Unfortunately the Cipro and Levaquin cannot be taken by her due to the interaction with Plaquenil causing a QT prolongation which is good to be a significant issue. I do not want that to be a complicating factor and therefore we need to look toward the [place] which [name] understand is definitely expensive but [name] am not sure if there is another option to be perfectly honest. [name] discussed this with the patient today and she is in agreement with plan we have also made a referral to infectious disease though if we can get this treated with Baxdela then she may not even end up requiring the infectious disease referral. Upon inspection patient's wound bed actually showed signs of 09-11-2022 upon evaluation today patient appears to be doing well currently in regard to her wound. She is actually making progress and we actually did find out today that we did get the [place] approved. This is great news as [name] think it will probably prevent her from having to go to the infectious disease doctors will get the infection under control as it stands right now. She is very pleased to hear this and [name] likewise [name] am extremely pleased. It still can be quite pricey it is good to be $350 with her insurance but that is better than close to 2000 without. 09-17-2022 upon evaluation today patient appears to be doing well currently in regard to her wounds. She has been tolerating the dressing changes without complication. Fortunately I do not see any  evidence of infection at this point which is great news we still had a tremendously difficult period of time trying to get the [place] approved and then once we got it approved apparently the pharmacy was unable to actually order the medication. This has been an extremely frustrating process in general. The patient has been extremely frustrated. 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 Nonetheless I do believe that this is still the best way to go with the topical gentamicin. 09-24-2022 upon evaluation today patient appears to be doing well with regard to her wounds she is actually making excellent progress and [name] think we are ready to switch to [facility] for the left leg as well we have already been using this on the right knee with great results. 10-01-2022 upon evaluation today patient actually appears to be doing excellent. She has been tolerating the [facility] and an excellent fashion without complication and  overall [name] am extremely pleased with where we stand today. There does not appear to be any signs of active infection locally nor systemically at this time. No fevers, chills, nausea, vomiting, or diarrhea. 10-09-2022 upon evaluation today patient appears to be doing well currently in regard to her wounds. Both are showing signs of excellent improvement there are some hypergranulation noted at both locations but other than that she really is doing quite well. 12/26; patient continues to have improvement in both wounds. The right medial lower leg almost closed the left upper thigh reduced nicely in dimensions. Using Hydrofera Blue and border foam Objective Constitutional Sitting or standing Blood Pressure is within target range for patient.. Pulse regular and within  target range for patient.Marland Kitchen Respirations regular, non-labored and within target range.. Temperature is normal and within the target range for the patient.Marland Kitchen appears in no distress. Vitals Time Taken: 7:47 AM, Height: 66 in, Weight: 180 lbs, BMI: 29, Temperature: 97.7 F, Pulse: 77 bpm, Respiratory Rate: 16 breaths/min, Blood Pressure: 139/68 mmHg. General Notes: Wound exam; both wounds of contracted in fact the right lower leg is just about healed. Under illumination there is some slough on the left thigh wound however with the improvement in dimensions I do not think debridement is necessary unless for some reason this stalls. No surrounding infection. Nice epithelialization Integumentary (Hair, Skin) Wound #2 status is Open. Original cause of wound was Trauma. The date acquired was: 08/11/2022. The wound has been in treatment 7 weeks. The wound is located on the Left,Distal Upper Leg. The wound measures 2.4cm length x 3.5cm width x 0.1cm depth; 6.597cm^2 area and 0.66cm^3 volume. There is Fat Layer (Subcutaneous Tissue) exposed. There is no tunneling or undermining noted. There is a medium amount of sanguinous drainage noted. There is large (67- 100%) red granulation within the wound bed. There is no necrotic tissue within the wound bed. Wound #3 status is Open. Original cause of wound was Trauma. The date acquired was: 08/11/2022. The wound has been in treatment 7 weeks. The wound is located on the Right Knee. The wound measures 0.1cm length x 0.5cm width x 0.1cm depth; 0.039cm^2 area and 0.004cm^3 volume. There is Fat Layer (Subcutaneous Tissue) exposed. There is no tunneling or undermining noted. There is a large amount of serosanguineous drainage noted. There is small (1-33%) hyper - granulation within the wound bed. There is no necrotic tissue within the wound bed. 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 Nurse Visit as needed Edema Control - Lymphedema / Segmental Compressive Device / Other: Elevate, Exercise Daily and Avoid Standing for Long Periods of Time. Elevate legs to the level of the heart and pump ankles as often as possible Elevate leg(s) parallel to the floor when sitting. WOUND #2: - Upper Leg Wound Laterality: Left, Distal Peri-Wound Care: Skin Prep 3 x Per Week/30 Days Discharge Instructions: Use skin prep as directed Prim Dressing: Hydrofera Blue Ready Transfer Foam, 2.5x2.5 (in/in) (Dispense As Written) 3 x Per Week/30 Days ary Discharge Instructions:  Apply Hydrofera Blue Ready to wound bed as directed Secondary Dressing: (BORDER) Zetuvit Plus SILICONE BORDER Dressing 5x5 (in/in) (Dispense As Written) 3 x Per Week/30 Days Discharge Instructions: Please do not put silicone bordered dressings under wraps. Use non-bordered dressing only. WOUND #3: - Knee Wound Laterality: Right Prim Dressing: Hydrofera Blue Ready Transfer Foam, 4x5 (in/in) 3 x Per Week/30 Days ary Discharge Instructions: Apply Hydrofera Blue Ready to wound bed as directed Secondary Dressing: (BORDER) Zetuvit Plus SILICONE BORDER Dressing 4x4 (in/in) 3 x Per Week/30 Days Discharge Instructions: Please do not put silicone bordered dressings under wraps. Use non-bordered dressing only. 1. Nice improvement in the wound dimensions in both wounds. In fact the right medial lower leg is just about  closed. 2. No mechanical debridement was felt to be necessary based on the improvement in wound dimensions. 3. No reason to change the primary dressing which is currently [facility] with border foam. The patient is changing this 3 times a week Electronic Signature(s) Signed: 10/16/2022 4:30:46 PM By: Linton Ham MD Entered By: Linton Ham on 10/16/2022 08:06:16 -------------------------------------------------------------------------------- SuperBill Details Patient Name: Date of Service: [name]. 10/16/2022 Medical Record Number: [id number] Patient Account Number: [id number] Date of Birth/Sex: Treating RN: 1955/11/21 (66 y.o. 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.3 Other dietary vitamin B12 deficiency anemia M32.8 Other forms of systemic lupus erythematosus Facility Procedures [name], [name] ([id number]): CPT4 Code Description [id number] [place] VISIT-LEV 3 EST PT [id number] Page 7 of 7: Modifier Quantity 1 Physician Procedures : CPT4 Code Description Modifier [id number] - WC PHYS LEVEL 3 - EST PT ICD-10 Diagnosis Description S80.12XA Contusion of left lower leg, initial encounter 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

## 2022-10-26 ENCOUNTER — Ambulatory Visit: Payer: Medicare Other | Admitting: Physician Assistant

## 2022-11-05 ENCOUNTER — Encounter: Payer: Medicare Other | Attending: Physician Assistant | Admitting: Physician Assistant

## 2022-11-05 DIAGNOSIS — S80812A Abrasion, left lower leg, initial encounter: Secondary | ICD-10-CM | POA: Diagnosis not present

## 2022-11-05 DIAGNOSIS — W010XXA Fall on same level from slipping, tripping and stumbling without subsequent striking against object, initial encounter: Secondary | ICD-10-CM | POA: Insufficient documentation

## 2022-11-05 DIAGNOSIS — W540XXA Bitten by dog, initial encounter: Secondary | ICD-10-CM | POA: Diagnosis not present

## 2022-11-05 DIAGNOSIS — M328 Other forms of systemic lupus erythematosus: Secondary | ICD-10-CM | POA: Insufficient documentation

## 2022-11-05 DIAGNOSIS — L98492 Non-pressure chronic ulcer of skin of other sites with fat layer exposed: Secondary | ICD-10-CM | POA: Diagnosis not present

## 2022-11-05 DIAGNOSIS — Z87891 Personal history of nicotine dependence: Secondary | ICD-10-CM | POA: Insufficient documentation

## 2022-11-05 DIAGNOSIS — S8012XA Contusion of left lower leg, initial encounter: Secondary | ICD-10-CM | POA: Diagnosis present

## 2022-11-05 DIAGNOSIS — L03116 Cellulitis of left lower limb: Secondary | ICD-10-CM | POA: Insufficient documentation

## 2022-11-05 NOTE — Progress Notes (Addendum)
CHARM, STENNER [name] ([id number]) [id number] Page 1 of 7 Visit Report for 11/05/2022 Chief Complaint Document Details Patient Name: Date of Service: [name]. 11/05/2022 8:15 A M Medical Record Number: [id number] Patient Account Number: [id number] Date of Birth/Sex: Treating RN: [birth year] (67 y.o. [name] Primary Care Provider: Jon Billings Other Clinician: Massie Kluver Referring Provider: Treating Provider/Extender: Venia Minks in Treatment: 10 Information Obtained from: Patient Chief Complaint Right knee and left thigh ulcers secondary to a dog bite and fall Electronic Signature(s) Signed: 11/05/2022 8:40:54 AM By: [name] PA-C Entered By: [name] on 11/05/2022 08:40:54 -------------------------------------------------------------------------------- HPI Details Patient Name: Date of Service: [name]. 11/05/2022 8:15 A M Medical Record Number: [id number] Patient Account Number: [id number] Date of Birth/Sex: Treating RN: 02-10-1956 (67 y.o. [name] Primary Care Provider: Jon Billings Other Clinician: Massie Kluver Referring Provider: Treating Provider/Extender: Venia Minks in Treatment: 10 History of Present Illness HPI Description: 08-24-2022 patient presents today for initial evaluation here in our clinic concerning issues that she has been having with wounds in 3 locations. 1 areas in the proximal left thigh this is due to a fall that she sustained on 08-11-2022. Subsequently she has then a second issue in the distal thigh as well as the right knee location. These are due to a unusual situation where she was actually leaving had just been petting her grand dog when she walked to her car. She tells me that she got dizzy and stumbled and fell and when she fell she called out. When she did this subsequently her grand dog ran over were not sure if  that he thought he was playing with her what but nonetheless ended up biting her on the left thigh as well as on the right knee. Here she had 2 areas of laceration which she subsequently went to the ER for and they attempted to suture both closed. Unfortunately that just did not take. At this point [name] think the sutures can need to be removed from both locations and to be honest there is a significant area of necrosis on the left thigh this may need to be cleaned out and away. Fortunately I do not see any signs of systemic infection though locally there is some evidence of infection she has been on Augmentin which just recently switched to doxycycline no cultures been performed at this point. From the standpoint of past medical history the patient does have a history of anemia which they believe is related to B12. She does not have diabetes she does have a history of lupus however. She is a former smoker. 11/7; this is a patient who has a severe dog bite injury on her upper anterior left thigh. Also an area on the right medial knee. We have been seeing her daily and using Dakin's wet-to-dry. [name] was shown yesterday the culture of drainage that was done last week that shows Pseudomonas. She has multiple drug interactions with quinolones therefore [name] [name], [name] ([id number]) [id number] Page 2 of 7 gave her a third-generation cephalosporin. HOWEVER apparently the pharmacy would not fill the prescription because of the concerns about allergic reaction to the capsule itself related to beef products. [name] could not find this anywhere even on Google however she has taken capsule antibiotics in the past and [name] told her [name] think that this risk is small compared to not treating the Pseudomonas. She has not been systemically unwell she is  in some discomfort 09-04-2022 upon evaluation today patient unfortunately he is continuing to have some significant issues here with her leg although I  do feel like she is better than she was last week. Fortunately there does not appear to be any evidence of systemic infection though locally we definitely noted Pseudomonas as the causative agent on the left thigh location with a culture that needed from the deep tissue wound area. Subsequently the only active antibiotics that are going to take care of this are Cipro, Levaquin, and Baxdela. Unfortunately the Cipro and Levaquin cannot be taken by her due to the interaction with Plaquenil causing a QT prolongation which is good to be a significant issue. I do not want that to be a complicating factor and therefore we need to look toward the [place] which [name] understand is definitely expensive but [name] am not sure if there is another option to be perfectly honest. [name] discussed this with the patient today and she is in agreement with plan we have also made a referral to infectious disease though if we can get this treated with Baxdela then she may not even end up requiring the infectious disease referral. Upon inspection patient's wound bed actually showed signs of 09-11-2022 upon evaluation today patient appears to be doing well currently in regard to her wound. She is actually making progress and we actually did find out today that we did get the [place] approved. This is great news as [name] think it will probably prevent her from having to go to the infectious disease doctors will get the infection under control as it stands right now. She is very pleased to hear this and [name] likewise [name] am extremely pleased. It still can be quite pricey it is good to be $350 with her insurance but that is better than close to 2000 without. 09-17-2022 upon evaluation today patient appears to be doing well currently in regard to her wounds. She has been tolerating the dressing changes without complication. Fortunately I do not see any evidence of infection at this point which is great news we still had a tremendously difficult period  of time trying to get the [place] approved and then once we got it approved apparently the pharmacy was unable to actually order the medication. This has been an extremely frustrating process in general. The patient has been extremely frustrated. 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 Nonetheless I do believe that this is still the best way to go with the topical gentamicin. 09-24-2022 upon evaluation today patient appears to be doing well with regard to her wounds she is actually making excellent progress and [name] think we are ready to switch to [facility] for the left leg as well we have already been using this on the right knee with great results. 10-01-2022 upon evaluation today patient actually appears to be doing excellent. She has been tolerating the [facility] and an excellent fashion without complication and overall [name] am extremely pleased with where we stand today. There does not appear to be any signs of active infection locally nor systemically at this time. No fevers, chills, nausea, vomiting, or diarrhea. 10-09-2022 upon evaluation today patient appears to be doing well currently in regard to her wounds. Both are showing signs  of excellent improvement there are some hypergranulation noted at both locations but other than that she really is doing quite well. 12/26; patient continues to have improvement in both wounds. The right medial lower leg almost closed the left upper thigh reduced nicely in dimensions. Using [facility] and border foam 11-05-2022 upon evaluation today patient actually appears to be making excellent progress. [name] am very pleased with where we stand today. She has been using the [facility] the right leg ulcer actually appears to be healed the  left leg ulcer on the thigh is significantly improved though not completely closed as of yet. Electronic Signature(s) Signed: 11/05/2022 2:44:19 PM By: [name] PA-C Entered By: [name] on 11/05/2022 14:44:19 -------------------------------------------------------------------------------- Physical Exam Details Patient Name: Date of Service: [name]. 11/05/2022 8:15 A M Medical Record Number: [id number] Patient Account Number: [id number] Date of Birth/Sex: Treating RN: 08-Jul-1956 (67 y.o. [name] Primary Care Provider: Jon Billings Other Clinician: Massie Kluver Referring Provider: Treating Provider/Extender: Venia Minks in Treatment: 10 Constitutional Obese and well-hydrated in no acute distress. Respiratory normal breathing without difficulty. Psychiatric this patient is able to make decisions and demonstrates good insight into disease process. Alert and Oriented x 3. pleasant and cooperative. Notes Upon inspection patient's wound bed actually showed signs again of great granulation epithelization she is very close to complete closure in regard to the left thigh and the right knee area is already completely closed which is great news. [name], [name] ([id number]) [id number] Page 3 of 7 Electronic Signature(s) Signed: 11/05/2022 2:44:33 PM By: [name] PA-C Entered By: [name] on 11/05/2022 14:44:33 -------------------------------------------------------------------------------- Physician Orders Details Patient Name: Date of Service: [name]. 11/05/2022 8:15 A M Medical Record Number: [id number] Patient Account Number: [id number] Date of Birth/Sex: Treating RN: 10/24/1955 (67 y.o. 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 Nurse Visit as needed Edema Control - Lymphedema / Segmental Compressive Device / Other Elevate, Exercise Daily and A void Standing for Long Periods of Time. Elevate legs to the level of the heart and pump ankles as often as possible Elevate leg(s) parallel to the floor when sitting. Wound Treatment Wound #2 - Upper Leg Wound Laterality: Left, Distal Peri-Wound Care: Skin Prep 3 x Per Week/30 Days Discharge Instructions: Use skin prep as directed Prim Dressing: Hydrofera Blue Ready Transfer Foam, 2.5x2.5 (in/in) (Dispense As Written) 3 x Per Week/30 Days ary Discharge Instructions: Apply Hydrofera Blue Ready to wound bed as directed Secondary Dressing: (BORDER) Zetuvit Plus SILICONE BORDER Dressing 5x5 (in/in) (Dispense As Written) 3 x Per Week/30 Days Discharge Instructions: Please do not put silicone bordered dressings under wraps. Use non-bordered dressing only. Electronic Signature(s) Signed: 11/06/2022 5:09:00 PM By: [name] PA-C Signed: 11/09/2022 9:50:53 AM By: Massie Kluver Entered  By: Massie Kluver on 11/05/2022 08:45:00 [name] ([id number]) [id number] Page 4 of 7 -------------------------------------------------------------------------------- Problem  List Details Patient Name: Date of Service: [name]. 11/05/2022 8:15 A M Medical Record Number: [id number] Patient Account Number: [id number] Date of Birth/Sex: Treating RN: [birth year] (67 y.o. [name] Primary Care Provider: Jon Billings Other Clinician: Massie Kluver Referring Provider: Treating Provider/Extender: Venia Minks in Treatment: 10 Active Problems ICD-10 Encounter Code Description Active Date MDM Diagnosis W54.0XXA Bitten by dog, initial encounter 08/24/2022 No Yes L03.116 Cellulitis of left lower limb 09/04/2022 No Yes S80.12XA Contusion of left lower leg, initial encounter 08/24/2022 No Yes T79.2XXA Traumatic secondary and recurrent hemorrhage and seroma, initial encounter 08/24/2022 No Yes S80.812A Abrasion, left lower leg, initial encounter 08/24/2022 No Yes L97.823 Non-pressure chronic ulcer of other part of left lower leg with necrosis of 08/24/2022 No Yes muscle L98.492 Non-pressure chronic ulcer of skin of other sites with fat layer exposed 08/24/2022 No Yes D51.3 Other dietary vitamin B12 deficiency anemia 08/24/2022 No Yes M32.8 Other forms of systemic lupus erythematosus 08/24/2022 No Yes Inactive Problems Resolved Problems Electronic Signature(s) Signed: 11/05/2022 8:40:50 AM By: [name] PA-C Entered By: [name] on 11/05/2022 08:40:50 [name] ([id number]) [id number] Page 5 of 7 -------------------------------------------------------------------------------- Progress Note Details Patient Name: Date of Service: [name]. 11/05/2022 8:15 A M Medical Record Number: [id number] Patient Account Number: [id number] Date of Birth/Sex: Treating RN: [birth year] (67 y.o. [name] Primary Care Provider: Jon Billings Other Clinician: Massie Kluver Referring Provider: Treating Provider/Extender: Venia Minks in Treatment:  10 Subjective Chief Complaint Information obtained from Patient Right knee and left thigh ulcers secondary to a dog bite and fall History of Present Illness (HPI) 08-24-2022 patient presents today for initial evaluation here in our clinic concerning issues that she has been having with wounds in 3 locations. 1 areas in the proximal left thigh this is due to a fall that she sustained on 08-11-2022. Subsequently she has then a second issue in the distal thigh as well as the right knee location. These are due to a unusual situation where she was actually leaving had just been petting her grand dog when she walked to her car. She tells me that she got dizzy and stumbled and fell and when she fell she called out. When she did this subsequently her grand dog ran over were not sure if that he thought he was playing with her what but nonetheless ended up biting her on the left thigh as well as on the right knee. Here she had 2 areas of laceration which she subsequently went to the ER for and they attempted to suture both closed. Unfortunately that just did not take. At this point [name] think the sutures can need to be removed from both locations and to be honest there is a significant area of necrosis on the left thigh this may need to be cleaned out and away. Fortunately I do not see any signs of systemic infection though locally there is some evidence of infection she has been on Augmentin which just recently switched to doxycycline no cultures been performed at this point. From the standpoint of past medical history the patient does have a history of anemia which they believe is related to B12. She does not have diabetes she does have a history of lupus however. She is a former smoker. 11/7; this is a patient who has a severe  dog bite injury on her upper anterior left thigh. Also an area on the right medial knee. We have been seeing her daily and using Dakin's wet-to-dry. [name] was shown yesterday the culture  of drainage that was done last week that shows Pseudomonas. She has multiple drug interactions with quinolones therefore [name] gave her a third-generation cephalosporin. HOWEVER apparently the pharmacy would not fill the prescription because of the concerns about allergic reaction to the capsule itself related to beef products. [name] could not find this anywhere even on Google however she has taken capsule antibiotics in the past and [name] told her [name] think that this risk is small compared to not treating the Pseudomonas. She has not been systemically unwell she is in some discomfort 09-04-2022 upon evaluation today patient unfortunately he is continuing to have some significant issues here with her leg although I do feel like she is better than she was last week. Fortunately there does not appear to be any evidence of systemic infection though locally we definitely noted Pseudomonas as the causative agent on the left thigh location with a culture that needed from the deep tissue wound area. Subsequently the only active antibiotics that are going to take care of this are Cipro, Levaquin, and Baxdela. Unfortunately the Cipro and Levaquin cannot be taken by her due to the interaction with Plaquenil causing a QT prolongation which is good to be a significant issue. I do not want that to be a complicating factor and therefore we need to look toward the [place] which [name] understand is definitely expensive but [name] am not sure if there is another option to be perfectly honest. [name] discussed this with the patient today and she is in agreement with plan we have also made a referral to infectious disease though if we can get this treated with Baxdela then she may not even end up requiring the infectious disease referral. Upon inspection patient's wound bed actually showed signs of 09-11-2022 upon evaluation today patient appears to be doing well currently in regard to her wound. She is actually making progress and we actually did  find out today that we did get the [place] approved. This is great news as [name] think it will probably prevent her from having to go to the infectious disease doctors will get the infection under control as it stands right now. She is very pleased to hear this and [name] likewise [name] am extremely pleased. It still can be quite pricey it is good to be $350 with her insurance but that is better than close to 2000 without. 09-17-2022 upon evaluation today patient appears to be doing well currently in regard to her wounds. She has been tolerating the dressing changes without complication. Fortunately I do not see any evidence of infection at this point which is great news we still had a tremendously difficult period of time trying to get the [place] approved and then once we got it approved apparently the pharmacy was unable to actually order the medication. This has been an extremely frustrating process in general. The patient has been extremely frustrated. 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 Nonetheless I do believe that this is still the best way to go with the topical gentamicin. 09-24-2022 upon evaluation today patient appears  to be doing well with regard to her wounds she is actually making excellent progress and [name] think we are ready to switch to [facility] for the left leg as well we have already been using this on the right knee with great results. 10-01-2022 upon evaluation today patient actually appears to be doing excellent. She has been tolerating the [facility] and an excellent fashion without complication and overall [name] am extremely pleased with where we stand today. There does not appear to be any signs of active infection locally nor systemically at this time. No fevers,  chills, nausea, vomiting, or diarrhea. 10-09-2022 upon evaluation today patient appears to be doing well currently in regard to her wounds. Both are showing signs of excellent improvement there are some hypergranulation noted at both locations but other than that she really is doing quite well. 12/26; patient continues to have improvement in both wounds. The right medial lower leg almost closed the left upper thigh reduced nicely in dimensions. Using [facility] and border foam 11-05-2022 upon evaluation today patient actually appears to be making excellent progress. [name] am very pleased with where we stand today. She has been using the [facility] the right leg ulcer actually appears to be healed the left leg ulcer on the thigh is significantly improved though not completely closed as of yet. [name], [name] ([id number]) [id number] Page 6 of 7 Objective Constitutional Obese and well-hydrated in no acute distress. Vitals Time Taken: 8:11 AM, Height: 66 in, Weight: 180 lbs, BMI: 29, Temperature: 98.0 F, Pulse: 76 bpm, Respiratory Rate: 16 breaths/min, Blood Pressure: 144/81 mmHg. Respiratory normal breathing without difficulty. Psychiatric this patient is able to make decisions and demonstrates good insight into disease process. Alert and Oriented x 3. pleasant and cooperative. General Notes: Upon inspection patient's wound bed actually showed signs again of great granulation epithelization she is very close to complete closure in regard to the left thigh and the right knee area is already completely closed which is great news. Integumentary (Hair, Skin) Wound #2 status is Open. Original cause of wound was Trauma. The date acquired was: 08/11/2022. The wound has been in treatment 10 weeks. The wound is located on the Left,Distal Upper Leg. The wound measures 0.8cm length x 0.5cm width x 0.1cm depth; 0.314cm^2 area and 0.031cm^3 volume. There is Fat Layer  (Subcutaneous Tissue) exposed. There is a medium amount of sanguinous drainage noted. There is large (67-100%) red granulation within the wound bed. There is no necrotic tissue within the wound bed. Wound #3 status is Open. Original cause of wound was Trauma. The date acquired was: 08/11/2022. The wound has been in treatment 10 weeks. The wound is located on the Right Knee. The wound measures 0cm length x 0cm width x 0cm depth; 0cm^2 area and 0cm^3 volume. There is a none present amount of drainage noted. There is no granulation within the wound bed. There is no necrotic tissue within the wound bed. 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 Nurse Visit as needed Edema Control - Lymphedema / Segmental Compressive Device /  Other: Elevate, Exercise Daily and Avoid Standing for Long Periods of Time. Elevate legs to the level of the heart and pump ankles as often as possible Elevate leg(s) parallel to the floor when sitting. WOUND #2: - Upper Leg Wound Laterality: Left, Distal Peri-Wound Care: Skin Prep 3 x Per Week/30 Days Discharge Instructions: Use skin prep as directed Prim Dressing: Hydrofera Blue Ready Transfer Foam, 2.5x2.5 (in/in) (Dispense As Written) 3 x Per Week/30 Days ary Discharge Instructions: Apply Hydrofera Blue Ready to wound bed as directed Secondary Dressing: (BORDER) Zetuvit Plus SILICONE BORDER Dressing 5x5 (in/in) (Dispense As Written) 3 x Per Week/30  Days Discharge Instructions: Please do not put silicone bordered dressings under wraps. Use non-bordered dressing only. 1. [name] would recommend currently that we have the patient going continue to monitor for any signs of infection or worsening. Based on what [name] am seeing [name] think that she is really making good progress here. 2. [name] am also can recommend that we stick with the [facility] for the left thigh which is doing well followed by the bordered foam dressing. 3. She will also continue to monitor for any signs of infection though right now [name] see no evidence of any issue whatsoever. We will see patient back for reevaluation in 1 week here in the clinic. If anything worsens or changes patient will contact our office for additional recommendations. [name], [name] ([id number]) [id number] Page 7 of 7 Electronic Signature(s) Signed: 11/05/2022 2:45:01 PM By: [name] PA-C Entered By: [name] on 11/05/2022 14:45:01 -------------------------------------------------------------------------------- SuperBill Details Patient Name: Date of Service: [name]. 11/05/2022 Medical Record Number: [id number] Patient Account Number: [id number] Date of Birth/Sex: Treating RN: 01/12/1956 (67 y.o. 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 D51.3 Other dietary vitamin B12 deficiency anemia M32.8 Other forms of systemic lupus erythematosus Facility Procedures : CPT4 Code: [id number] Description: 20947 - WOUND CARE VISIT-LEV 3 EST PT Modifier: Quantity: 1 Physician Procedures : CPT4 Code Description Modifier [id number] - WC PHYS LEVEL 3 - EST PT ICD-10 Diagnosis Description W54.0XXA Bitten by dog, initial encounter L03.116 Cellulitis of left lower limb S80.12XA Contusion of left lower leg, initial encounter T79.2XXA  Traumatic secondary and recurrent hemorrhage and seroma, initial encounter Quantity: 1 Electronic Signature(s) Signed: 11/05/2022 2:45:19 PM By: [name] PA-C Entered By: [name] on 11/05/2022 14:45:19

## 2022-11-09 NOTE — Progress Notes (Signed)
ALIYYAH, RIESE [name] ([id number]) [id number] Page 1 of 9 Visit Report for 11/05/2022 Arrival Information Details Patient Name: Date of Service: [name]. 11/05/2022 8:15 A M Medical Record Number: [id number] Patient Account Number: [id number] Date of Birth/Sex: Treating RN: 1956-09-17 (67 y.o. [name], [name] Primary Care Zoee Heeney: Jon Billings Other Clinician: Massie Kluver Referring Renley Banwart: Treating Ion Gonnella/Extender: Venia Minks in Treatment: 10 Visit Information History Since Last Visit All ordered tests and consults were completed: No Patient Arrived: Ambulatory Added or deleted any medications: No Arrival Time: 08:09 Any new allergies or adverse reactions: No Transfer Assistance: None Had a fall or experienced change in No Patient Requires Transmission-Based Precautions: No activities of daily living that may affect Patient Has Alerts: Yes risk of falls: Patient Alerts: NOT Diabetic Signs or symptoms of abuse/neglect since last visito No Hospitalized since last visit: No Implantable device outside of the clinic excluding No cellular tissue based products placed in the center since last visit: Has Dressing in Place as Prescribed: Yes Pain Present Now: No Electronic Signature(s) Signed: 11/09/2022 9:50:53 AM By: Massie Kluver Entered By: Massie Kluver on 11/05/2022 08:10:32 -------------------------------------------------------------------------------- Clinic Level of Care Assessment Details Patient Name: Date of Service: [name]. 11/05/2022 8:15 A M Medical Record Number: [id number] Patient Account Number: [id number] Date of Birth/Sex: Treating RN: 08-05-1956 (67 y.o. [name] Primary Care Michaila Kenney: Jon Billings Other Clinician: Massie Kluver Referring Marlow Berenguer: Treating Analyn Matusek/Extender: Venia Minks in Treatment: 10 Clinic Level of Care Assessment  Items TOOL 4 Quantity Score '[]'$  - 0 Use when only an EandM is performed on FOLLOW-UP visit ASSESSMENTS - Nursing Assessment / Reassessment X- 1 10 Reassessment of Co-morbidities (includes updates in patient status) X- 1 5 Reassessment of Adherence to Treatment Plan [name], [name] ([id number]) [phone].pdf Page 2 of 9 ASSESSMENTS - Wound and Skin A ssessment / Reassessment '[]'$  - 0 Simple Wound Assessment / Reassessment - one wound X- 2 5 Complex Wound Assessment / Reassessment - multiple wounds '[]'$  - 0 Dermatologic / Skin Assessment (not related to wound area) ASSESSMENTS - Focused Assessment '[]'$  - 0 Circumferential Edema Measurements - multi extremities '[]'$  - 0 Nutritional Assessment / Counseling / Intervention '[]'$  - 0 Lower Extremity Assessment (monofilament, tuning fork, pulses) '[]'$  - 0 Peripheral Arterial Disease Assessment (using hand held doppler) ASSESSMENTS - Ostomy and/or Continence Assessment and Care '[]'$  - 0 Incontinence Assessment and Management '[]'$  - 0 Ostomy Care Assessment and Management (repouching, etc.) PROCESS - Coordination of Care X - Simple Patient / Family Education for ongoing care 1 15 '[]'$  - 0 Complex (extensive) Patient / Family Education for ongoing care '[]'$  - 0 Staff obtains Programmer, systems, Records, T Results / Process Orders est '[]'$  - 0 Staff telephones HHA, Nursing Homes / Clarify orders / etc '[]'$  - 0 Routine Transfer to another Facility (non-emergent condition) '[]'$  - 0 Routine Hospital Admission (non-emergent condition) '[]'$  - 0 New Admissions / Biomedical engineer / Ordering NPWT Apligraf, etc. , '[]'$  - 0 Emergency Hospital Admission (emergent condition) X- 1 10 Simple Discharge Coordination '[]'$  - 0 Complex (extensive) Discharge Coordination PROCESS - Special Needs '[]'$  - 0 Pediatric / Minor Patient Management '[]'$  - 0 Isolation Patient Management '[]'$  - 0 Hearing / Language / Visual special needs '[]'$  - 0 Assessment of  Community assistance (transportation, D/C planning, etc.) '[]'$  - 0 Additional assistance / Altered mentation '[]'$  - 0 Support Surface(s) Assessment (bed, cushion, seat, etc.) INTERVENTIONS - Wound Cleansing / Measurement  X - Simple Wound Cleansing - one wound 1 5 X- 2 5 Complex Wound Cleansing - multiple wounds X- 1 5 Wound Imaging (photographs - any number of wounds) '[]'$  - 0 Wound Tracing (instead of photographs) X- 1 5 Simple Wound Measurement - one wound '[]'$  - 0 Complex Wound Measurement - multiple wounds INTERVENTIONS - Wound Dressings '[]'$  - 0 Small Wound Dressing one or multiple wounds X- 1 15 Medium Wound Dressing one or multiple wounds '[]'$  - 0 Large Wound Dressing one or multiple wounds '[]'$  - 0 Application of Medications - topical '[]'$  - 0 Application of Medications - injection INTERVENTIONS - Miscellaneous '[]'$  - 0 External ear exam [name], [name] ([id number]) [id number].pdf Page 3 of 9 '[]'$  - 0 Specimen Collection (cultures, biopsies, blood, body fluids, etc.) '[]'$  - 0 Specimen(s) / Culture(s) sent or taken to Lab for analysis '[]'$  - 0 Patient Transfer (multiple staff / Harrel Lemon Lift / Similar devices) '[]'$  - 0 Simple Staple / Suture removal (25 or less) '[]'$  - 0 Complex Staple / Suture removal (26 or more) '[]'$  - 0 Hypo / Hyperglycemic Management (close monitor of Blood Glucose) '[]'$  - 0 Ankle / Brachial Index (ABI) - do not check if billed separately X- 1 5 Vital Signs Has the patient been seen at the hospital within the last three years: Yes Total Score: 95 Level Of Care: New/Established - Level 3 Electronic Signature(s) Signed: 11/09/2022 9:50:53 AM By: Massie Kluver Entered By: Massie Kluver on 11/05/2022 08:46:00 -------------------------------------------------------------------------------- Encounter Discharge Information Details Patient Name: Date of Service: [name]. 11/05/2022 8:15 A M Medical Record Number: [id number] Patient  Account Number: [id number] Date of Birth/Sex: Treating RN: May 10, 1956 (67 y.o. [name] Primary Care Charlissa Petros: Jon Billings Other Clinician: Massie Kluver Referring Natassia Guthridge: Treating Sigismund Cross/Extender: Venia Minks in Treatment: 10 Encounter Discharge Information Items Discharge Condition: Stable Ambulatory Status: Ambulatory Discharge Destination: Home Transportation: Private Auto Accompanied By: self Schedule Follow-up Appointment: Yes Clinical Summary of Care: Electronic Signature(s) Signed: 11/09/2022 9:50:53 AM By: Massie Kluver Entered By: Massie Kluver on 11/05/2022 08:53:49 Lower Extremity Assessment Details -------------------------------------------------------------------------------- [name] ([id number]) [id number] Page 4 of 9 Patient Name: Date of Service: [name]. 11/05/2022 8:15 A M Medical Record Number: [id number] Patient Account Number: [id number] Date of Birth/Sex: Treating RN: 1956/02/21 (67 y.o. [name] Primary Care Shila Kruczek: Jon Billings Other Clinician: Massie Kluver Referring Idaliz Tinkle: Treating Emmogene Simson/Extender: Venia Minks in Treatment: 10 Electronic Signature(s) Signed: 11/05/2022 6:16:56 PM By: Gretta Cool BSN, RN, CWS, [name] RN, BSN Signed: 11/09/2022 9:50:53 AM By: Massie Kluver Entered By: Massie Kluver on 11/05/2022 08:20:35 -------------------------------------------------------------------------------- Multi Wound Chart Details Patient Name: Date of Service: [name]. 11/05/2022 8:15 A M Medical Record Number: [id number] Patient Account Number: [id number] Date of Birth/Sex: Treating RN: 05-26-1956 (67 y.o. [name], [name] Primary Care Torrin Frein: Jon Billings Other Clinician: Massie Kluver Referring Burt Piatek: Treating Tashunda Vandezande/Extender: Venia Minks in Treatment: 10 Vital  Signs Height(in): 36 Pulse(bpm): 56 Weight(lbs): 180 Blood Pressure(mmHg): 144/81 Body Mass Index(BMI): 29 Temperature(F): 98.0 Respiratory Rate(breaths/min): 16 [2:Photos:] [N/A:N/A] Left, Distal Upper Leg Right Knee N/A Wound Location: Trauma Trauma N/A Wounding Event: Trauma, Other Trauma, Other N/A Primary Etiology: Anemia, Hypertension, Lupus Anemia, Hypertension, Lupus N/A Comorbid History: Erythematosus, Osteoarthritis, Erythematosus, Osteoarthritis, Received Radiation Received Radiation 08/11/2022 08/11/2022 N/A Date Acquired: 10 10 N/A Weeks of Treatment: Open Open N/A Wound Status: No No N/A Wound Recurrence: 0.8x0.5x0.1 0x0x0 N/A Measurements L  x W x D (cm) 0.314 0 N/A A (cm) : rea 0.031 0 N/A Volume (cm) : 99.70% 100.00% N/A % Reduction in Area: 100.00% 100.00% N/A % Reduction in Volume: Full Thickness With Exposed Support Full Thickness Without Exposed N/A Classification: Structures Support Structures Medium None Present N/A Exudate Amount: Sanguinous N/A N/A Exudate Type: red N/A N/A Exudate Color: Large (67-100%) None Present (0%) N/A Granulation Amount: Red N/A N/A Granulation Quality: None Present (0%) None Present (0%) N/A Necrotic Amount: Fat Layer (Subcutaneous Tissue): Yes Fat Layer (Subcutaneous Tissue): No N/A Exposed StructuresKYNLEIGH, [name] ([id number]) [id number] Page 5 of 9 Medium (34-66%) Large (67-100%) N/A Epithelialization: Treatment Notes Electronic Signature(s) Signed: 11/09/2022 9:50:53 AM By: Massie Kluver Entered By: Massie Kluver on 11/05/2022 08:20:41 -------------------------------------------------------------------------------- Multi-Disciplinary Care Plan Details Patient Name: Date of Service: [name]. 11/05/2022 8:15 A M Medical Record Number: [id number] Patient Account Number: [id number] Date of Birth/Sex: Treating RN: 1956/09/28 (66 y.o. [name],  [name] Primary Care Donato Studley: Jon Billings Other Clinician: Massie Kluver Referring Nakiah Osgood: Treating Cherylin Waguespack/Extender: Venia Minks in Treatment: 10 Active Inactive Wound/Skin Impairment Nursing Diagnoses: Impaired tissue integrity Knowledge deficit related to ulceration/compromised skin integrity Goals: Patient/caregiver will verbalize understanding of skin care regimen Date Initiated: 08/24/2022 Target Resolution Date: 08/24/2022 Goal Status: Active Ulcer/skin breakdown will have a volume reduction of 30% by week 4 Date Initiated: 08/24/2022 Date Inactivated: 10/16/2022 Target Resolution Date: 09/21/2022 Goal Status: Met Ulcer/skin breakdown will have a volume reduction of 50% by week 8 Date Initiated: 10/16/2022 Target Resolution Date: 10/23/2022 Goal Status: Active Interventions: Assess patient/caregiver ability to obtain necessary supplies Assess ulceration(s) every visit Treatment Activities: Referred to DME Kadelyn Dimascio for dressing supplies : 08/24/2022 Skin care regimen initiated : 08/24/2022 Topical wound management initiated : 08/24/2022 Notes: Electronic Signature(s) Signed: 11/05/2022 6:16:56 PM By: Gretta Cool, BSN, RN, CWS, [name] RN, BSN Signed: 11/09/2022 9:50:53 AM By: Massie Kluver Entered By: Massie Kluver on 11/05/2022 08:53:10 [name] ([id number]) [id number] Page 6 of 9 -------------------------------------------------------------------------------- Pain Assessment Details Patient Name: Date of Service: [name]. 11/05/2022 8:15 A M Medical Record Number: [id number] Patient Account Number: [id number] Date of Birth/Sex: Treating RN: 02/11/1956 (66 y.o. [name] Primary Care Brain Honeycutt: Jon Billings Other Clinician: Massie Kluver Referring Lauris Serviss: Treating Adelei Scobey/Extender: Venia Minks in Treatment: 10 Active Problems Location of Pain Severity and  Description of Pain Patient Has Paino No Site Locations Pain Management and Medication Current Pain Management: Electronic Signature(s) Signed: 11/05/2022 6:16:56 PM By: Gretta Cool, BSN, RN, CWS, [name] RN, BSN Signed: 11/09/2022 9:50:53 AM By: Massie Kluver Entered By: Massie Kluver on 11/05/2022 08:14:23 -------------------------------------------------------------------------------- Patient/Caregiver Education Details Patient Name: Date of Service: [name]. 1/15/2024andnbsp8:15 [place] Record Number: [id number] Patient Account Number: [id number] Date of Birth/Gender: Treating RN: 01-Nov-1955 (67 y.o. [name] Primary Care Physician: Jon Billings Other Clinician: Massie Kluver Referring Physician: Treating Physician/Extender: Venia Minks in Treatment: [address] [place] [name] ([id number]) [phone].pdf Page 7 of 9 Education Assessment Education Provided To: Patient Education Topics Provided Wound/Skin Impairment: Handouts: Other: continue wound care as directed Methods: Explain/Verbal Responses: State content correctly Electronic Signature(s) Signed: 11/09/2022 9:50:53 AM By: Massie Kluver Entered By: Massie Kluver on 11/05/2022 08:53:04 -------------------------------------------------------------------------------- Wound Assessment Details Patient Name: Date of Service: [name]. 11/05/2022 8:15 A M Medical Record Number: [id number] Patient Account Number: [id number] Date of Birth/Sex: Treating RN: [birth year] (67 y.o. [name] Primary Care  Selso Mannor: Jon Billings Other Clinician: Massie Kluver Referring Yamilex Borgwardt: Treating Gunnison Chahal/Extender: Venia Minks in Treatment: 10 Wound Status Wound Number: 2 Primary Trauma, Other Etiology: Wound Location: Left, Distal Upper Leg Wound Open Wounding Event: Trauma Status: Date Acquired: 08/11/2022 Comorbid Anemia,  Hypertension, Lupus Erythematosus, Osteoarthritis, Weeks Of Treatment: 10 History: Received Radiation Clustered Wound: No Photos Wound Measurements Length: (cm) 0.8 Width: (cm) 0.5 Depth: (cm) 0.1 Area: (cm) 0.314 Volume: (cm) 0.031 % Reduction in Area: 99.7% % Reduction in Volume: 100% Epithelialization: Medium (34-66%) Wound Description Classification: Full Thickness With Exposed Support Structures Exudate Amount: Medium Exudate Type: Sanguinous [name], [name] ([id number]) Exudate Color: red Foul Odor After Cleansing: No [id number] Page 8 of 9 Wound Bed Granulation Amount: Large (67-100%) Exposed Structure Granulation Quality: Red Fat Layer (Subcutaneous Tissue) Exposed: Yes Necrotic Amount: None Present (0%) Treatment Notes Wound #2 (Upper Leg) Wound Laterality: Left, Distal Cleanser Peri-Wound Care Skin Prep Discharge Instruction: Use skin prep as directed Topical Primary Dressing Hydrofera Blue Ready Transfer Foam, 2.5x2.5 (in/in) Discharge Instruction: Apply Hydrofera Blue Ready to wound bed as directed Secondary Dressing (BORDER) Zetuvit Plus SILICONE BORDER Dressing 5x5 (in/in) Discharge Instruction: Please do not put silicone bordered dressings under wraps. Use non-bordered dressing only. Secured With Compression Wrap Compression Stockings Environmental education officer) Signed: 11/05/2022 6:16:56 PM By: Gretta Cool, BSN, RN, CWS, [name] RN, BSN Signed: 11/09/2022 9:50:53 AM By: Massie Kluver Entered By: Massie Kluver on 11/05/2022 08:19:37 -------------------------------------------------------------------------------- Wound Assessment Details Patient Name: Date of Service: [name]. 11/05/2022 8:15 A M Medical Record Number: [id number] Patient Account Number: [id number] Date of Birth/Sex: Treating RN: 08-10-1956 (67 y.o. [name] Primary Care Haeley Fordham: Jon Billings Other Clinician: Massie Kluver Referring  Larraine Argo: Treating Hally Colella/Extender: Venia Minks in Treatment: 10 Wound Status Wound Number: 3 Primary Trauma, Other Etiology: Wound Location: Right Knee Wound Open Wounding Event: Trauma Status: Date Acquired: 08/11/2022 Comorbid Anemia, Hypertension, Lupus Erythematosus, Osteoarthritis, Weeks Of Treatment: 10 History: Received Radiation Clustered Wound: No Photos [name], [name] ([id number]) [id number] Page 9 of 9 Wound Measurements Length: (cm) Width: (cm) Depth: (cm) Area: (cm) Volume: (cm) 0 % Reduction in Area: 100% 0 % Reduction in Volume: 100% 0 Epithelialization: Large (67-100%) 0 0 Wound Description Classification: Full Thickness Without Exposed Support Exudate Amount: None Present Structures Foul Odor After Cleansing: No Slough/Fibrino No Wound Bed Granulation Amount: None Present (0%) Exposed Structure Necrotic Amount: None Present (0%) Fat Layer (Subcutaneous Tissue) Exposed: No Electronic Signature(s) Signed: 11/05/2022 6:16:56 PM By: Gretta Cool, BSN, RN, CWS, [name] RN, BSN Signed: 11/09/2022 9:50:53 AM By: Massie Kluver Entered By: Massie Kluver on 11/05/2022 08:20:21 -------------------------------------------------------------------------------- Vitals Details Patient Name: Date of Service: [name]. 11/05/2022 8:15 A M Medical Record Number: [id number] Patient Account Number: [id number] Date of Birth/Sex: Treating RN: 01-Jul-1956 (67 y.o. [name], [name] Primary Care Wendelyn Kiesling: Jon Billings Other Clinician: Massie Kluver Referring Senita Corredor: Treating Hattie Pine/Extender: Venia Minks in Treatment: 10 Vital Signs Time Taken: 08:11 Temperature (F): 98.0 Height (in): 66 Pulse (bpm): 76 Weight (lbs): 180 Respiratory Rate (breaths/min): 16 Body Mass Index (BMI): 29 Blood Pressure (mmHg): 144/81 Reference Range: 80 - 120 mg / dl Electronic  Signature(s) Signed: 11/09/2022 9:50:53 AM By: Massie Kluver Entered By: Massie Kluver on 11/05/2022 08:14:16

## 2022-11-15 ENCOUNTER — Encounter: Payer: Medicare Other | Admitting: Internal Medicine

## 2022-11-15 DIAGNOSIS — S8012XA Contusion of left lower leg, initial encounter: Secondary | ICD-10-CM | POA: Diagnosis not present

## 2022-11-20 NOTE — Progress Notes (Signed)
[name], GARZON [name] ([id number]) [id number] Page 1 of 8 Visit Report for 11/15/2022 Arrival Information Details Patient Name: Date of Service: [name] 11/15/2022 2:45 PM Medical Record Number: [id number] Patient Account Number: [id number] Date of Birth/Sex: Treating RN: Jul 13, 1956 (67 y.o. [name] Primary Care [name]: [name] Other Clinician: Massie [name] Referring [name]: Treating [name]/Extender: [name], [name] [name] in Treatment: 11 Visit Information History Since Last Visit All ordered tests and consults were completed: No Patient Arrived: Ambulatory Added or deleted any medications: No Arrival Time: 15:22 Any new allergies or adverse reactions: No Transfer Assistance: None Had a fall or experienced change in No Patient Identification Verified: Yes activities of daily living that may affect Secondary Verification Process Completed: Yes risk of falls: Patient Requires Transmission-Based Precautions: No Signs or symptoms of abuse/neglect since last visito No Patient Has Alerts: Yes Hospitalized since last visit: No Patient Alerts: NOT Diabetic Implantable device outside of the clinic excluding No cellular tissue based products placed in the center since last visit: Has Dressing in Place as Prescribed: Yes Pain Present Now: No Electronic Signature(s) Signed: 11/20/2022 4:19:16 PM By: [name] Entered By: [name] on 11/15/2022 15:25:35 -------------------------------------------------------------------------------- Clinic Level of Care Assessment Details Patient Name: Date of Service: [name] 11/15/2022 2:45 PM Medical Record Number: [id number] Patient Account Number: [id number] Date of Birth/Sex: Treating RN: 07/30/1956 (67 y.o. [name] Primary Care Harika Laidlaw: [name] Other Clinician: Massie [name] Referring Catherene Kaleta: Treating  [name]/Extender: [name], [name] [name] in Treatment: 11 Clinic Level of Care Assessment Items TOOL 4 Quantity Score '[]'$  - 0 Use when only an EandM is performed on FOLLOW-UP visit ASSESSMENTS - Nursing Assessment / Reassessment X- 1 10 Reassessment of Co-morbidities (includes updates in patient status) X- 1 5 Reassessment of Adherence to Treatment Plan [name] [name] ([id number]) [id number] Page 2 of 8 ASSESSMENTS - Wound and Skin A ssessment / Reassessment X - Simple Wound Assessment / Reassessment - one wound 1 5 '[]'$  - 0 Complex Wound Assessment / Reassessment - multiple wounds '[]'$  - 0 Dermatologic / Skin Assessment (not related to wound area) ASSESSMENTS - Focused Assessment '[]'$  - 0 Circumferential Edema Measurements - multi extremities '[]'$  - 0 Nutritional Assessment / Counseling / Intervention '[]'$  - 0 Lower Extremity Assessment (monofilament, tuning fork, pulses) '[]'$  - 0 Peripheral Arterial Disease Assessment (using hand held doppler) ASSESSMENTS - Ostomy and/or Continence Assessment and Care '[]'$  - 0 Incontinence Assessment and Management '[]'$  - 0 Ostomy Care Assessment and Management (repouching, etc.) PROCESS - Coordination of Care X - Simple Patient / Family Education for ongoing care 1 15 '[]'$  - 0 Complex (extensive) Patient / Family Education for ongoing care '[]'$  - 0 Staff obtains Programmer, systems, Records, T Results / Process Orders est '[]'$  - 0 Staff telephones HHA, Nursing Homes / Clarify orders / etc '[]'$  - 0 Routine Transfer to another Facility (non-emergent condition) '[]'$  - 0 Routine Hospital Admission (non-emergent condition) '[]'$  - 0 New Admissions / Biomedical engineer / Ordering NPWT Apligraf, etc. , '[]'$  - 0 Emergency Hospital Admission (emergent condition) X- 1 10 Simple Discharge Coordination '[]'$  - 0 Complex (extensive) Discharge Coordination PROCESS - Special Needs '[]'$  - 0 Pediatric / Minor Patient  Management '[]'$  - 0 Isolation Patient Management '[]'$  - 0 Hearing / Language / Visual special needs '[]'$  - 0 Assessment of Community assistance (transportation, D/C planning, etc.) '[]'$  - 0 Additional assistance / Altered mentation '[]'$  -  0 Support Surface(s) Assessment (bed, cushion, seat, etc.) INTERVENTIONS - Wound Cleansing / Measurement X - Simple Wound Cleansing - one wound 1 5 '[]'$  - 0 Complex Wound Cleansing - multiple wounds X- 1 5 Wound Imaging (photographs - any number of wounds) '[]'$  - 0 Wound Tracing (instead of photographs) X- 1 5 Simple Wound Measurement - one wound '[]'$  - 0 Complex Wound Measurement - multiple wounds INTERVENTIONS - Wound Dressings '[]'$  - 0 Small Wound Dressing one or multiple wounds X- 1 15 Medium Wound Dressing one or multiple wounds '[]'$  - 0 Large Wound Dressing one or multiple wounds '[]'$  - 0 Application of Medications - topical '[]'$  - 0 Application of Medications - injection INTERVENTIONS - Miscellaneous '[]'$  - 0 External ear exam [name], PARSLEY [name] ([id number]) [id number] Page 3 of 8 '[]'$  - 0 Specimen Collection (cultures, biopsies, blood, body fluids, etc.) '[]'$  - 0 Specimen(s) / Culture(s) sent or taken to Lab for analysis '[]'$  - 0 Patient Transfer (multiple staff / [name] / Similar devices) '[]'$  - 0 Simple Staple / Suture removal (25 or less) '[]'$  - 0 Complex Staple / Suture removal (26 or more) '[]'$  - 0 Hypo / Hyperglycemic Management (close monitor of Blood Glucose) '[]'$  - 0 Ankle / Brachial Index (ABI) - do not check if billed separately X- 1 5 Vital Signs Has the patient been seen at the hospital within the last three years: Yes Total Score: 80 Level Of Care: New/Established - Level 3 Electronic Signature(s) Signed: 11/20/2022 4:19:16 PM By: [name] Entered By: [name] on 11/15/2022 15:47:48 -------------------------------------------------------------------------------- Encounter Discharge Information  Details Patient Name: Date of Service: [name]. 11/15/2022 2:45 PM Medical Record Number: [id number] Patient Account Number: [id number] Date of Birth/Sex: Treating RN: 10/21/1956 (67 y.o. [name] Primary Care Shamekia Tippets: [name] Other Clinician: Massie [name] Referring Samarrah Tranchina: Treating Miyu Fenderson/Extender: Kristine BSO Delane Ginger, [name] [place], Areatha Keas in Treatment: 11 Encounter Discharge Information Items Discharge Condition: Stable Ambulatory Status: Ambulatory Discharge Destination: Home Transportation: Private Auto Accompanied By: self Schedule Follow-up Appointment: Yes Clinical Summary of Care: Electronic Signature(s) Signed: 11/20/2022 4:19:16 PM By: [name] Entered By: [name] on 11/15/2022 15:52:59 Lower Extremity Assessment Details -------------------------------------------------------------------------------- [name] ([id number]) [id number] Page 4 of 8 Patient Name: Date of Service: [name] 11/15/2022 2:45 PM Medical Record Number: [id number] Patient Account Number: [id number] Date of Birth/Sex: Treating RN: [birth year] (67 y.o. [name] Primary Care Axel Meas: [name] Other Clinician: Massie [name] Referring Kemara Quigley: Treating Marquise Lambson/Extender: Kristine BSO Delane Ginger, [name] [name] in Treatment: 11 Electronic Signature(s) Signed: 11/16/2022 12:04:39 PM By: Gretta Cool BSN, RN, CWS, [name] RN, BSN Signed: 11/20/2022 4:19:16 PM By: [name] Entered By: [name] on 11/15/2022 15:33:54 -------------------------------------------------------------------------------- Multi Wound Chart Details Patient Name: Date of Service: [name]. 11/15/2022 2:45 PM Medical Record Number: [id number] Patient Account Number: [id number] Date of Birth/Sex: Treating RN: [birth year] (67 y.o. [name] Primary Care Rekia Kujala: [name] Other Clinician:  Massie [name] Referring Julizza Sassone: Treating Demontae Antunes/Extender: [name], [place], Areatha Keas in Treatment: 11 Vital Signs Height(in): 31 Pulse(bpm): 73 Weight(lbs): 180 Blood Pressure(mmHg): 136/69 Body Mass Index(BMI): 29 Temperature(F): 97.4 Respiratory Rate(breaths/min): 16 [2:Photos:] [[name]/A:[name]/A] Left, Distal Upper Leg [name]/A [name]/A Wound Location: Trauma [name]/A [name]/A Wounding Event: Trauma, Other [name]/A [name]/A Primary Etiology: Anemia, Hypertension, Lupus [name]/A [name]/A Comorbid History: Erythematosus, Osteoarthritis, Received Radiation 08/11/2022 [name]/A [name]/A Date Acquired: 11 [name]/A [name]/A Weeks of Treatment:  Open [name]/A [name]/A Wound Status: No [name]/A [name]/A Wound Recurrence: 1x1.7x0.1 [name]/A [name]/A Measurements L x W x D (cm) 1.335 [name]/A [name]/A A (cm) : rea 0.134 [name]/A [name]/A Volume (cm) : 98.70% [name]/A [name]/A % Reduction in Area: 100.00% [name]/A [name]/A % Reduction in Volume: Full Thickness With Exposed Support [name]/A [name]/A Classification: Structures Medium [name]/A [name]/A Exudate Amount: Sanguinous [name]/A [name]/A Exudate Type: red [name]/A [name]/A Exudate Color: Large (67-100%) [name]/A [name]/A Granulation Amount: Red [name]/A [name]/A Granulation Quality: None Present (0%) [name]/A [name]/A Necrotic Amount: Fat Layer (Subcutaneous Tissue): Yes [name]/A [name]/A Exposed StructuresSHUREE, BROSSART [name] ([id number]) [id number] Page 5 of 8 Medium (34-66%) [name]/A [name]/A Epithelialization: Treatment Notes Electronic Signature(s) Signed: 11/20/2022 4:19:16 PM By: [name] Entered By: [name] on 11/15/2022 15:34:02 -------------------------------------------------------------------------------- Multi-Disciplinary Care Plan Details Patient Name: Date of Service: [name]. 11/15/2022 2:45 PM Medical Record Number: [id number] Patient Account Number: [id number] Date of Birth/Sex: Treating RN: Jul 05, 1956 (67 y.o. [name], [name] Primary Care Gyasi Hazzard: [name] Other Clinician: Massie [name] Referring Theo Reither: Treating  Rhyann Berton/Extender: [name], [place] [name] [place], Areatha Keas in Treatment: 11 Active Inactive Wound/Skin Impairment Nursing Diagnoses: Impaired tissue integrity Knowledge deficit related to ulceration/compromised skin integrity Goals: Patient/caregiver will verbalize understanding of skin care regimen Date Initiated: 08/24/2022 Target Resolution Date: 08/24/2022 Goal Status: Active Ulcer/skin breakdown will have a volume reduction of 30% by week 4 Date Initiated: 08/24/2022 Date Inactivated: 10/16/2022 Target Resolution Date: 09/21/2022 Goal Status: Met Ulcer/skin breakdown will have a volume reduction of 50% by week 8 Date Initiated: 10/16/2022 Target Resolution Date: 10/23/2022 Goal Status: Active Interventions: Assess patient/caregiver ability to obtain necessary supplies Assess ulceration(s) every visit Treatment Activities: Referred to DME Dyonna Jaspers for dressing supplies : 08/24/2022 Skin care regimen initiated : 08/24/2022 Topical wound management initiated : 08/24/2022 Notes: Electronic Signature(s) Signed: 11/16/2022 12:04:39 PM By: Gretta Cool, BSN, RN, CWS, [name] RN, BSN Signed: 11/20/2022 4:19:16 PM By: [name] Entered By: [name] on 11/15/2022 15:52:29 [name] ([id number]) [id number] Page 6 of 8 -------------------------------------------------------------------------------- Pain Assessment Details Patient Name: Date of Service: [name] 11/15/2022 2:45 PM Medical Record Number: [id number] Patient Account Number: [id number] Date of Birth/Sex: Treating RN: [birth year] (67 y.o. [name] Primary Care Yarisa Lynam: [name] Other Clinician: Massie [name] Referring Tery Hoeger: Treating Lorrin Nawrot/Extender: [name], [name] [name] in Treatment: 11 Active Problems Location of Pain Severity and Description of Pain Patient Has Paino No Site Locations Pain Management and Medication Current  Pain Management: Electronic Signature(s) Signed: 11/16/2022 12:04:39 PM By: Gretta Cool, BSN, RN, CWS, [name] RN, BSN Signed: 11/20/2022 4:19:16 PM By: [name] Entered By: [name] on 11/15/2022 15:28:18 -------------------------------------------------------------------------------- Patient/Caregiver Education Details Patient Name: Date of Service: [name]. 1/25/2024andnbsp2:45 PM Medical Record Number: [id number] Patient Account Number: [id number] Date of Birth/Gender: Treating RN: 26-Sep-1956 (67 y.o. [name] Primary Care Physician: [name] Other Clinician: Massie [name] Referring Physician: Treating Physician/Extender: Kristine BSO Delane Ginger, [name] [name] in Treatment: [address], [place] [name] ([id number]) [id number] Page 7 of 8 Education Assessment Education Provided To: Patient Education Topics Provided Engineer, maintenance) Signed: 11/20/2022 4:19:16 PM By: [name] Entered By: [name] on 11/15/2022 15:52:23 -------------------------------------------------------------------------------- Wound Assessment Details Patient Name: Date of Service: [name]. 11/15/2022 2:45 PM Medical Record Number: [id number] Patient Account Number: [id number] Date of Birth/Sex: Treating RN: 1956-02-02 (67 y.o. [name] Primary Care Earlee Herald: [name] Other Clinician: Massie [name]  Referring Chelsie Burel: Treating Hendel Gatliff/Extender: [name], [name] [place] Range Weeks in Treatment: 11 Wound Status Wound Number: 2 Primary Trauma, Other Etiology: Wound Location: Left, Distal Upper Leg Wound Open Wounding Event: Trauma Status: Date Acquired: 08/11/2022 Comorbid Anemia, Hypertension, Lupus Erythematosus, Osteoarthritis, Weeks Of Treatment: 11 History: Received Radiation Clustered Wound: No Photos Wound Measurements Length: (cm) 1 Width: (cm) 1.7 Depth: (cm) 0.1 Area: (cm)  1.335 Volume: (cm) 0.134 % Reduction in Area: 98.7% % Reduction in Volume: 100% Epithelialization: Medium (34-66%) Wound Description Classification: Full Thickness With Exposed Suppo Exudate Amount: Medium Exudate Type: Sanguinous Exudate Color: red rt Structures Foul Odor After Cleansing: No Wound Bed Granulation Amount: Large (67-100%) Exposed Structure Granulation Quality: Red Fat Layer (Subcutaneous Tissue) ExposedBERLIN, VIERECK [name] ([id number]) [id number] Page 8 of 8 Necrotic Amount: None Present (0%) Treatment Notes Wound #2 (Upper Leg) Wound Laterality: Left, Distal Cleanser Peri-Wound Care Skin Prep Discharge Instruction: Use skin prep as directed Topical Primary Dressing Hydrofera Blue Ready Transfer Foam, 2.5x2.5 (in/in) Discharge Instruction: Apply Hydrofera Blue Ready to wound bed as directed Secondary Dressing (BORDER) Zetuvit Plus SILICONE BORDER Dressing 5x5 (in/in) Discharge Instruction: Please do not put silicone bordered dressings under wraps. Use non-bordered dressing only. Secured With Compression Wrap Compression Stockings Environmental education officer) Signed: 11/16/2022 12:04:39 PM By: Gretta Cool, BSN, RN, CWS, [name] RN, BSN Signed: 11/20/2022 4:19:16 PM By: [name] Entered By: [name] on 11/15/2022 15:32:24 -------------------------------------------------------------------------------- Vitals Details Patient Name: Date of Service: [name]. 11/15/2022 2:45 PM Medical Record Number: [id number] Patient Account Number: [id number] Date of Birth/Sex: Treating RN: [birth year] (67 y.o. [name], [name] Primary Care Yesika Rispoli: [name] Other Clinician: Massie [name] Referring Loriann Bosserman: Treating Yassin Scales/Extender: [name], [place], Areatha Keas in Treatment: 11 Vital Signs Time Taken: 15:26 Temperature (F): 97.4 Height (in): 66 Pulse (bpm): 85 Weight (lbs): 180 Respiratory  Rate (breaths/min): 16 Body Mass Index (BMI): 29 Blood Pressure (mmHg): 136/69 Reference Range: 80 - 120 mg / dl Electronic Signature(s) Signed: 11/20/2022 4:19:16 PM By: [name] Entered By: [name] on 11/15/2022 15:28:14

## 2022-11-20 NOTE — Progress Notes (Signed)
TACY, CHAVIS [name] ([id number]) [id number] Page 1 of 7 Visit Report for 11/15/2022 HPI Details Patient Name: Date of Service: Kristine [name] 11/15/2022 2:45 PM Medical Record Number: [id number] Patient Account Number: [id number] Date of Birth/Sex: Treating RN: 07/24/1956 (67 y.o. Kristine [name] Primary Care Provider: Jon Billings Other Clinician: Massie Kluver Referring Provider: Treating Provider/Extender: RO BSO N, Kristine [name] in [name]: 11 History of Present Illness HPI Description: 08-24-2022 patient presents today for initial evaluation here in our clinic concerning issues that she has been having with wounds in 3 locations. 1 areas in the proximal left thigh this is due to a fall that she sustained on 08-11-2022. Subsequently she has then a second issue in the distal thigh as well as the right knee location. These are due to a unusual situation where she was actually leaving had just been petting her grand dog when she walked to her car. She tells me that she got dizzy and stumbled and fell and when she fell she called out. When she did this subsequently her grand dog ran over were not sure if that he thought he was playing with her what but nonetheless ended up biting her on the left thigh as well as on the right knee. Here she had 2 areas of laceration which she subsequently went to the ER for and they attempted to suture both closed. Unfortunately that just did not take. At this point [name] think the sutures can need to be removed from both locations and to be honest there is a significant area of necrosis on the left thigh this may need to be cleaned out and away. Fortunately I do not see any signs of systemic infection though locally there is some evidence of infection she has been on Augmentin which just recently switched to doxycycline no cultures been performed at this point. From the standpoint of past medical history  the patient does have a history of anemia which they believe is related to B12. She does not have diabetes she does have a history of lupus however. She is a former smoker. 11/7; this is a patient who has a severe dog bite injury on her upper anterior left thigh. Also an area on the right medial knee. We have been seeing her daily and using Dakin's wet-to-dry. [name] was shown yesterday the culture of drainage that was done last week that shows Pseudomonas. She has multiple drug interactions with quinolones therefore [name] gave her a third-generation cephalosporin. HOWEVER apparently the pharmacy would not fill the prescription because of the concerns about allergic reaction to the capsule itself related to beef products. [name] could not find this anywhere even on Google however she has taken capsule antibiotics in the past and [name] told her [name] think that this risk is small compared to not treating the Pseudomonas. She has not been systemically unwell she is in some discomfort 09-04-2022 upon evaluation today patient unfortunately he is continuing to have some significant issues here with her leg although I do feel like she is better than she was last week. Fortunately there does not appear to be any evidence of systemic infection though locally we definitely noted Pseudomonas as the causative agent on the left thigh location with a culture that needed from the deep tissue wound area. Subsequently the only active antibiotics that are going to take care of this are Cipro, Levaquin, and Baxdela. Unfortunately the Cipro and Levaquin cannot be taken by her due to the  interaction with Plaquenil causing a QT prolongation which is good to be a significant issue. I do not want that to be a complicating factor and therefore we need to look toward the [place] which [name] understand is definitely expensive but [name] am not sure if there is another option to be perfectly honest. [name] discussed this with the patient today and she is  in agreement with plan we have also made a referral to infectious disease though if we can get this treated with Baxdela then she may not even end up requiring the infectious disease referral. Upon inspection patient's wound bed actually showed signs of 09-11-2022 upon evaluation today patient appears to be doing well currently in regard to her wound. She is actually making progress and we actually did find out today that we did get the [place] approved. This is great news as [name] think it will probably prevent her from having to go to the infectious disease doctors will get the infection under control as it stands right now. She is very pleased to hear this and [name] likewise [name] am extremely pleased. It still can be quite pricey it is good to be $350 with her insurance but that is better than close to 2000 without. 09-17-2022 upon evaluation today patient appears to be doing well currently in regard to her wounds. She has been tolerating the dressing changes without complication. Fortunately I do not see any evidence of infection at this point which is great news we still had a tremendously difficult period of time trying to get the [place] approved and then once we got it approved apparently the pharmacy was unable to actually order the medication. This has been an extremely frustrating process in general. The patient has been extremely frustrated. 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 Nonetheless I do believe that this is still the best way to go with the topical gentamicin. 09-24-2022 upon evaluation today patient appears to be doing well with regard to her wounds she is actually making excellent progress and [name] think we are ready to switch to [facility] for the left leg as well we have  already been using this on the right knee with great results. 10-01-2022 upon evaluation today patient actually appears to be doing excellent. She has been tolerating the [facility] and an excellent fashion without complication and overall [name] am extremely pleased with where we stand today. There does not appear to be any signs of active infection locally nor systemically at this time. No fevers, chills, nausea, vomiting, or diarrhea. 10-09-2022 upon evaluation today patient appears to be doing well currently in regard to her wounds. Both are showing signs of excellent improvement there are some hypergranulation noted at both locations but other than that she really is doing quite well. 12/26; patient continues to have improvement in both wounds. The right medial lower leg almost closed the left upper thigh reduced nicely in dimensions. Using [facility] and border foam 11-05-2022 upon evaluation today patient actually appears to be making excellent progress. [name] am very pleased with where we stand today. She has been using the [facility] the right leg ulcer actually appears to be healed the left leg ulcer on the thigh is significantly improved though not completely closed as  of yet. KEERSTIN, BJELLAND [name] ([id number]) [id number] Page 2 of 7 1/25; remarkable improvement. Using Hydrofera Blue. For some reason the patient stopped using Hydrofera Blue sometime since the last time we saw her and just started using AandE ointment. The wound still looks quite good however [name] reiterated [name] thought it was beneficial to continue with the Hydrofera Blue change every second day Electronic Signature(s) Signed: 11/15/2022 4:38:56 PM By: Linton Ham MD Entered By: Linton Ham on 11/15/2022 15:59:37 -------------------------------------------------------------------------------- Physical Exam Details Patient Name: Date of Service: Kristine [name]. 11/15/2022 2:45  PM Medical Record Number: [id number] Patient Account Number: [id number] Date of Birth/Sex: Treating RN: 1956-06-19 (67 y.o. Kristine [name] Primary Care Provider: Jon Billings Other Clinician: Massie Kluver Referring Provider: Treating Provider/Extender: RO BSO N, Kristine [name], Kristine [name]: 11 Constitutional Sitting or standing Blood Pressure is within target range for patient.. Pulse regular and within target range for patient.Marland Kitchen Respirations regular, non-labored and within target range.. Temperature is normal and within the target range for the patient.Marland Kitchen appears in no distress. Notes Wound exam; nice superficial wound on the upper anterior left thigh. Under illumination healthy looking tissue nice rim of epithelialization. No evidence of infection Electronic Signature(s) Signed: 11/15/2022 4:38:56 PM By: Linton Ham MD Entered By: Linton Ham on 11/15/2022 16:00:33 -------------------------------------------------------------------------------- Physician Orders Details Patient Name: Date of Service: Kristine [name]. 11/15/2022 2:45 PM Medical Record Number: [id number] Patient Account Number: [id number] Date of Birth/Sex: Treating RN: Aug 27, 1956 (67 y.o. Kristine [name] Primary Care Provider: Jon Billings Other Clinician: Massie Kluver Referring Provider: Treating Provider/Extender: RO BSO N, [place], Kristine [name]: 11 Verbal / Phone Orders: No Diagnosis Coding Follow-up Appointments Return Appointment in 1 week. Nurse Visit as needed Edema Control - Lymphedema / Segmental Compressive Device / Kristine [name], Kristine [name] ([id number]) [id number] Page 3 of 7 Elevate, Exercise Daily and A void Standing for Long Periods of Time. Elevate legs to the level of the heart and pump ankles as often as possible Elevate leg(s) parallel to the floor when sitting. Wound [name] Wound #2 - Upper Leg  Wound Laterality: Left, Distal Peri-Wound Care: Skin Prep 3 x Per Week/30 Days Discharge Instructions: Use skin prep as directed Prim Dressing: Hydrofera Blue Ready Transfer Foam, 2.5x2.5 (in/in) (Dispense As Written) 3 x Per Week/30 Days ary Discharge Instructions: Apply Hydrofera Blue Ready to wound bed as directed Secondary Dressing: (BORDER) Zetuvit Plus SILICONE BORDER Dressing 5x5 (in/in) (Dispense As Written) 3 x Per Week/30 Days Discharge Instructions: Please do not put silicone bordered dressings under wraps. Use non-bordered dressing only. Electronic Signature(s) Signed: 11/15/2022 4:38:56 PM By: Linton Ham MD Signed: 11/20/2022 4:19:16 PM By: Massie Kluver Entered By: Massie Kluver on 11/15/2022 15:47:12 -------------------------------------------------------------------------------- Problem List Details Patient Name: Date of Service: Kristine [name]. 11/15/2022 2:45 PM Medical Record Number: [id number] Patient Account Number: [id number] Date of Birth/Sex: Treating RN: 10-05-1956 (67 y.o. Kristine [name] Primary Care Provider: Jon Billings Other Clinician: Massie Kluver Referring Provider: Treating Provider/Extender: RO BSO N, Kristine [name] in [name]: 11 Active Problems ICD-10 Encounter Code Description Active Date MDM Diagnosis W54.0XXA Bitten by dog, initial encounter 08/24/2022 No Yes L03.116 Cellulitis of left lower limb 09/04/2022 No Yes S80.12XA Contusion of left lower leg, initial encounter 08/24/2022 No Yes T79.2XXA Traumatic secondary and recurrent hemorrhage and seroma, initial encounter 08/24/2022 No Yes S80.812A Abrasion, left lower leg, initial encounter 08/24/2022 No Yes L97.823 Non-pressure chronic  ulcer of other part of left lower leg with necrosis of 08/24/2022 No Yes muscle L98.492 Non-pressure chronic ulcer of skin of other sites with fat layer exposed 08/24/2022 No Yes Kristine [name], Kristine [name] ([id number])  [id number] Page 4 of 7 D51.3 Other dietary vitamin B12 deficiency anemia 08/24/2022 No Yes M32.8 Other forms of systemic lupus erythematosus 08/24/2022 No Yes Inactive Problems Resolved Problems Electronic Signature(s) Signed: 11/15/2022 4:38:56 PM By: Linton Ham MD Entered By: Linton Ham on 11/15/2022 15:58:50 -------------------------------------------------------------------------------- Progress Note Details Patient Name: Date of Service: Kristine [name]. 11/15/2022 2:45 PM Medical Record Number: [id number] Patient Account Number: [id number] Date of Birth/Sex: Treating RN: 1955/12/03 (67 y.o. Kristine [name] Primary Care Provider: Jon Billings Other Clinician: Massie Kluver Referring Provider: Treating Provider/Extender: RO BSO N, Kristine [name] in [name]: 11 Subjective History of Present Illness (HPI) 08-24-2022 patient presents today for initial evaluation here in our clinic concerning issues that she has been having with wounds in 3 locations. 1 areas in the proximal left thigh this is due to a fall that she sustained on 08-11-2022. Subsequently she has then a second issue in the distal thigh as well as the right knee location. These are due to a unusual situation where she was actually leaving had just been petting her grand dog when she walked to her car. She tells me that she got dizzy and stumbled and fell and when she fell she called out. When she did this subsequently her grand dog ran over were not sure if that he thought he was playing with her what but nonetheless ended up biting her on the left thigh as well as on the right knee. Here she had 2 areas of laceration which she subsequently went to the ER for and they attempted to suture both closed. Unfortunately that just did not take. At this point [name] think the sutures can need to be removed from both locations and to be honest there is a significant area of  necrosis on the left thigh this may need to be cleaned out and away. Fortunately I do not see any signs of systemic infection though locally there is some evidence of infection she has been on Augmentin which just recently switched to doxycycline no cultures been performed at this point. From the standpoint of past medical history the patient does have a history of anemia which they believe is related to B12. She does not have diabetes she does have a history of lupus however. She is a former smoker. 11/7; this is a patient who has a severe dog bite injury on her upper anterior left thigh. Also an area on the right medial knee. We have been seeing her daily and using Dakin's wet-to-dry. [name] was shown yesterday the culture of drainage that was done last week that shows Pseudomonas. She has multiple drug interactions with quinolones therefore [name] gave her a third-generation cephalosporin. HOWEVER apparently the pharmacy would not fill the prescription because of the concerns about allergic reaction to the capsule itself related to beef products. [name] could not find this anywhere even on Google however she has taken capsule antibiotics in the past and [name] told her [name] think that this risk is small compared to not treating the Pseudomonas. She has not been systemically unwell she is in some discomfort 09-04-2022 upon evaluation today patient unfortunately he is continuing to have some significant issues here with her leg although I do feel like she is better than she was  last week. Fortunately there does not appear to be any evidence of systemic infection though locally we definitely noted Pseudomonas as the causative agent on the left thigh location with a culture that needed from the deep tissue wound area. Subsequently the only active antibiotics that are going to take care of this are Cipro, Levaquin, and Baxdela. Unfortunately the Cipro and Levaquin cannot be taken by her due to the interaction with Plaquenil  causing a QT prolongation which is good to be a significant issue. I do not want that to be a complicating factor and therefore we need to look toward the [place] which [name] understand is definitely expensive but [name] am not sure if there is another option to be perfectly honest. [name] discussed this with the patient today and she is in agreement with plan we have also made a referral to infectious disease though if we can get this treated with Baxdela then she may not even end up requiring the infectious disease referral. Upon inspection patient's wound bed actually showed signs of 09-11-2022 upon evaluation today patient appears to be doing well currently in regard to her wound. She is actually making progress and we actually did find out today that we did get the [place] approved. This is great news as [name] think it will probably prevent her from having to go to the infectious disease doctors will get the infection under control as it stands right now. She is very pleased to hear this and [name] likewise [name] am extremely pleased. It still can be quite pricey it is good to be $350 with her insurance but that is better than close to 2000 without. Kristine [name], Kristine [name] ([id number]) [id number] Page 5 of 7 09-17-2022 upon evaluation today patient appears to be doing well currently in regard to her wounds. She has been tolerating the dressing changes without complication. Fortunately I do not see any evidence of infection at this point which is great news we still had a tremendously difficult period of time trying to get the [place] approved and then once we got it approved apparently the pharmacy was unable to actually order the medication. This has been an extremely frustrating process in general. The patient has been extremely frustrated. Nonetheless she seems to actually be making excellent progress and in fact [name] am not even certain is good to be necessary to use the oral antibiotics at this  point which will definitely save her a lot of money which she is very pleased about and to employees. Nonetheless I do believe that this is still the best way to go with the topical gentamicin. 09-24-2022 upon evaluation today patient appears to be doing well with regard to her wounds she is actually making excellent progress and [name] think we are ready to switch to [facility] for the left leg as well we have already been using this on the right knee with great results. 10-01-2022 upon evaluation today patient actually appears to be doing excellent. She has been tolerating the [facility] and an excellent fashion without complication and overall [name] am extremely pleased with where we stand today. There does not appear to be any signs of active infection locally nor systemically at this time. No fevers, chills, nausea, vomiting, or diarrhea. 10-09-2022 upon evaluation today patient appears to be doing well currently in regard to her wounds. Both are showing signs of excellent improvement there are some hypergranulation noted at both locations but other than that she really is doing quite well. 12/26; patient  continues to have improvement in both wounds. The right medial lower leg almost closed the left upper thigh reduced nicely in dimensions. Using [facility] and border foam 11-05-2022 upon evaluation today patient actually appears to be making excellent progress. [name] am very pleased with where we stand today. She has been using the [facility] the right leg ulcer actually appears to be healed the left leg ulcer on the thigh is significantly improved though not completely closed as of yet. 1/25; remarkable improvement. Using Hydrofera Blue. For some reason the patient stopped using Hydrofera Blue sometime since the last time we saw her and just started using AandE ointment. The wound still looks quite good however [name] reiterated [name] thought it was beneficial to continue with the Hydrofera Blue  change every second day Objective Constitutional Sitting or standing Blood Pressure is within target range for patient.. Pulse regular and within target range for patient.Marland Kitchen Respirations regular, non-labored and within target range.. Temperature is normal and within the target range for the patient.Marland Kitchen appears in no distress. Vitals Time Taken: 3:26 PM, Height: 66 in, Weight: 180 lbs, BMI: 29, Temperature: 97.4 F, Pulse: 85 bpm, Respiratory Rate: 16 breaths/min, Blood Pressure: 136/69 mmHg. General Notes: Wound exam; nice superficial wound on the upper anterior left thigh. Under illumination healthy looking tissue nice rim of epithelialization. No evidence of infection Integumentary (Hair, Skin) Wound #2 status is Open. Original cause of wound was Trauma. The date acquired was: 08/11/2022. The wound has been in [name] 11 weeks. The wound is located on the Left,Distal Upper Leg. The wound measures 1cm length x 1.7cm width x 0.1cm depth; 1.335cm^2 area and 0.134cm^3 volume. There is Fat Layer (Subcutaneous Tissue) exposed. There is a medium amount of sanguinous drainage noted. There is large (67-100%) red granulation within the wound bed. There is no necrotic tissue within the wound bed. 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 Nurse Visit as needed Edema Control - Lymphedema / Segmental Compressive Device / Other: Elevate, Exercise Daily and Avoid Standing for Long  Periods of Time. Elevate legs to the level of the heart and pump ankles as often as possible Elevate leg(s) parallel to the floor when sitting. Kristine [name], Kristine [name] [name] ([id number]) [id number] Page 6 of 7 WOUND #2: - Upper Leg Wound Laterality: Left, Distal Peri-Wound Care: Skin Prep 3 x Per Week/30 Days Discharge Instructions: Use skin prep as directed Prim Dressing: Hydrofera Blue Ready Transfer Foam, 2.5x2.5 (in/in) (Dispense As Written) 3 x Per Week/30 Days ary Discharge Instructions: Apply Hydrofera Blue Ready to wound bed as directed Secondary Dressing: (BORDER) Zetuvit Plus SILICONE BORDER Dressing 5x5 (in/in) (Dispense As Written) 3 x Per Week/30 Days Discharge Instructions: Please do not put silicone bordered dressings under wraps. Use non-bordered dressing only. 1. [name] emphasized [name] thought she needed to continue with the Hydrofera Blue change every second day 2. The wound surface looks healthy nice rims of epithelialization Electronic Signature(s) Signed: 11/15/2022 4:38:56 PM By: Linton Ham MD Entered By: Linton Ham on 11/15/2022 16:00:59 -------------------------------------------------------------------------------- SuperBill Details Patient Name: Date of Service: Kristine [name],  Kristine [name]. 11/15/2022 Medical Record Number: [id number] Patient Account Number: [id number] Date of Birth/Sex: Treating RN: 11-07-1955 (68 y.o. 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.3 Other dietary vitamin B12 deficiency anemia M32.8 Other forms of systemic lupus erythematosus Facility Procedures : CPT4 Code: 83358251 Description: 99213 - WOUND CARE VISIT-LEV 3 EST PT Modifier: Quantity: 1 Physician Procedures : CPT4 Code Description Modifier [id number] 99213 - WC PHYS LEVEL 3 - EST PT ICD-10 Diagnosis Description L98.492 Non-pressure chronic ulcer of skin of other sites with fat layer exposed W54.0XXA Bitten by dog, initial encounter Quantity: 1 Electronic Signature(s) Signed: 11/15/2022 4:38:56 PM By: Linton Ham MD Entered By: Linton Ham on 11/15/2022 16:01:53 Kristine [name], Kristine [name] ([id number]) [id number] Page 7 of 7

## 2022-11-23 ENCOUNTER — Ambulatory Visit: Payer: Medicare Other | Admitting: Nurse Practitioner

## 2022-11-26 ENCOUNTER — Ambulatory Visit: Payer: Medicare Other | Admitting: Nurse Practitioner

## 2022-11-29 ENCOUNTER — Ambulatory Visit (INDEPENDENT_AMBULATORY_CARE_PROVIDER_SITE_OTHER): Payer: Medicare Other | Admitting: Nurse Practitioner

## 2022-11-29 ENCOUNTER — Encounter: Payer: Self-pay | Admitting: Nurse Practitioner

## 2022-11-29 ENCOUNTER — Encounter: Payer: Medicare Other | Attending: Physician Assistant | Admitting: Physician Assistant

## 2022-11-29 VITALS — BP 125/74 | HR 84 | Temp 98.3°F | Wt 185.7 lb

## 2022-11-29 DIAGNOSIS — M3214 Glomerular disease in systemic lupus erythematosus: Secondary | ICD-10-CM

## 2022-11-29 DIAGNOSIS — W010XXA Fall on same level from slipping, tripping and stumbling without subsequent striking against object, initial encounter: Secondary | ICD-10-CM | POA: Diagnosis not present

## 2022-11-29 DIAGNOSIS — N1831 Chronic kidney disease, stage 3a: Secondary | ICD-10-CM

## 2022-11-29 DIAGNOSIS — S8012XA Contusion of left lower leg, initial encounter: Secondary | ICD-10-CM | POA: Insufficient documentation

## 2022-11-29 DIAGNOSIS — I10 Essential (primary) hypertension: Secondary | ICD-10-CM

## 2022-11-29 DIAGNOSIS — E559 Vitamin D deficiency, unspecified: Secondary | ICD-10-CM

## 2022-11-29 DIAGNOSIS — L98492 Non-pressure chronic ulcer of skin of other sites with fat layer exposed: Secondary | ICD-10-CM | POA: Diagnosis present

## 2022-11-29 DIAGNOSIS — M328 Other forms of systemic lupus erythematosus: Secondary | ICD-10-CM | POA: Diagnosis not present

## 2022-11-29 DIAGNOSIS — W540XXA Bitten by dog, initial encounter: Secondary | ICD-10-CM | POA: Insufficient documentation

## 2022-11-29 DIAGNOSIS — S80812A Abrasion, left lower leg, initial encounter: Secondary | ICD-10-CM | POA: Diagnosis not present

## 2022-11-29 DIAGNOSIS — M1990 Unspecified osteoarthritis, unspecified site: Secondary | ICD-10-CM | POA: Insufficient documentation

## 2022-11-29 DIAGNOSIS — F419 Anxiety disorder, unspecified: Secondary | ICD-10-CM

## 2022-11-29 DIAGNOSIS — L03116 Cellulitis of left lower limb: Secondary | ICD-10-CM | POA: Insufficient documentation

## 2022-11-29 DIAGNOSIS — D519 Vitamin B12 deficiency anemia, unspecified: Secondary | ICD-10-CM | POA: Diagnosis not present

## 2022-11-29 NOTE — Assessment & Plan Note (Signed)
Labs ordered at visit today.  Will make recommendations based on lab results.   

## 2022-11-29 NOTE — Assessment & Plan Note (Signed)
Chronic, ongoing.  Followed by [facility] nephrology.  Recent note reviewed.  Recheck CMP today.

## 2022-11-29 NOTE — Progress Notes (Signed)
BP 125/74   Pulse 84   Temp 98.3 F (36.8 C) (Oral)   Wt 185 lb 11.2 oz (84.2 kg)   LMP 08/22/2008   SpO2 97%   BMI 29.76 kg/m    Subjective:    Patient ID: [name], female    DOB: Jul 17, 1956, 67 y.o.   MRN: [id number]  HPI: [name] is a 67 y.o. female  Chief Complaint  Patient presents with   Anemia   Wound Check   HYPERTENSION with Chronic Kidney Disease Hypertension status: controlled  Satisfied with current treatment? yes Duration of hypertension: years BP monitoring frequency:  daily BP range: 130/70 BP medication side effects:  no Medication compliance: excellent compliance Previous BP meds: enalapril and chlorthalidone Aspirin: no Recurrent headaches: no Visual changes: no Palpitations: no Dyspnea: no Chest pain: no Lower extremity edema: no Dizzy/lightheaded: no  ANEMIA Anemia status: controlled Etiology of anemia: B12 and CKD Duration of anemia treatment:  Compliance with treatment: excellent compliance Iron supplementation side effects: no Severity of anemia: mild Fatigue: yes Decreased exercise tolerance: no  Dyspnea on exertion: no Palpitations: no Bleeding: no Pica: no  Patient states her wounds are healing.  She has still been following up with the wound clinic.  Her right lower leg wound has healed.  Her left leg wound isn't completely healed but looks much better.   ANXIETY Patient states her anxiety has been good.  She thinks she has taken one of the Klonopin. She feels like she is doing well.    Relevant past medical, surgical, family and social history reviewed and updated as indicated. Interim medical history since our last visit reviewed. Allergies and medications reviewed and updated.  Review of Systems  Constitutional:  Negative for fatigue.  Eyes:  Negative for visual disturbance.  Respiratory:  Negative for cough, chest tightness and shortness of breath.   Cardiovascular:  Negative for chest pain,  palpitations and leg swelling.  Neurological:  Negative for dizziness and headaches.  Psychiatric/Behavioral:  The patient is nervous/anxious.     Per HPI unless specifically indicated above     Objective:    BP 125/74   Pulse 84   Temp 98.3 F (36.8 C) (Oral)   Wt 185 lb 11.2 oz (84.2 kg)   LMP 08/22/2008   SpO2 97%   BMI 29.76 kg/m   Wt Readings from Last 3 Encounters:  11/29/22 185 lb 11.2 oz (84.2 kg)  10/11/22 180 lb 8 oz (81.9 kg)  10/02/22 181 lb 9.6 oz (82.4 kg)    Physical Exam Vitals and nursing note reviewed.  Constitutional:      General: She is not in acute distress.    Appearance: Normal appearance. She is not ill-appearing, toxic-appearing or diaphoretic.  HENT:     Head: Normocephalic.     Right Ear: External ear normal.     Left Ear: External ear normal.     Nose: Nose normal.     Mouth/Throat:     Mouth: Mucous membranes are moist.     Pharynx: Oropharynx is clear.  Eyes:     General:        Right eye: No discharge.        Left eye: No discharge.     Extraocular Movements: Extraocular movements intact.     Conjunctiva/sclera: Conjunctivae normal.     Pupils: Pupils are equal, round, and reactive to light.  Cardiovascular:     Rate and Rhythm: Normal rate and regular rhythm.  Heart sounds: No murmur heard. Pulmonary:     Effort: Pulmonary effort is normal. No respiratory distress.     Breath sounds: Normal breath sounds. No wheezing or rales.  Musculoskeletal:     Cervical back: Normal range of motion and neck supple.  Skin:    General: Skin is warm and dry.     Capillary Refill: Capillary refill takes less than 2 seconds.  Neurological:     General: No focal deficit present.     Mental Status: She is alert and oriented to person, place, and time. Mental status is at baseline.  Psychiatric:        Mood and Affect: Mood normal.        Behavior: Behavior normal.        Thought Content: Thought content normal.        Judgment: Judgment  normal.     Results for orders placed or performed in visit on 10/11/22  CBC w/Diff  Result Value Ref Range   WBC 10.1 3.4 - 10.8 x10E3/uL   RBC 2.94 (L) 3.77 - 5.28 x10E6/uL   Hemoglobin 9.4 (L) 11.1 - 15.9 g/dL   Hematocrit 28.4 (L) 34.0 - 46.6 %   MCV 97 79 - 97 fL   MCH 32.0 26.6 - 33.0 pg   MCHC 33.1 31.5 - 35.7 g/dL   RDW 13.1 11.7 - 15.4 %   Platelets 604 (H) 150 - 450 x10E3/uL   Neutrophils 58 Not Estab. %   Lymphs 28 Not Estab. %   Monocytes 11 Not Estab. %   Eos 2 Not Estab. %   Basos 1 Not Estab. %   Neutrophils Absolute 5.8 1.4 - 7.0 x10E3/uL   Lymphocytes Absolute 2.8 0.7 - 3.1 x10E3/uL   Monocytes Absolute 1.1 (H) 0.1 - 0.9 x10E3/uL   EOS (ABSOLUTE) 0.2 0.0 - 0.4 x10E3/uL   Basophils Absolute 0.1 0.0 - 0.2 x10E3/uL   Immature Granulocytes 0 Not Estab. %   Immature Grans (Abs) 0.0 0.0 - 0.1 x10E3/uL  B12  Result Value Ref Range   Vitamin B-12 1,472 (H) 232 - 1,245 pg/mL  Iron, TIBC and Ferritin Panel  Result Value Ref Range   Total Iron Binding Capacity 321 250 - 450 ug/dL   UIBC 254 118 - 369 ug/dL   Iron 67 27 - 139 ug/dL   Iron Saturation 21 15 - 55 %   Ferritin 117 15 - 150 ng/mL      Assessment & Plan:   Problem List Items Addressed This Visit       Cardiovascular and Mediastinum   Hypertension    Chronic.  Controlled.  Continue with current medication regimen of enalapril and Chlorthalidone.  Medications are prescribed by [facility].  Labs ordered today.  Return to clinic in 6 months for reevaluation.  Call sooner if concerns arise.        Relevant Orders   Comp Met (CMET)     Genitourinary   Stage 3a chronic kidney disease (CKD) ([place]) - Primary    Chronic, ongoing.  Followed by [facility] nephrology.  Recent note reviewed.  Recheck CMP today.      Relevant Orders   Comp Met (CMET)   Iron, TIBC and Ferritin Panel   Stage III lupus nephritis ([facility]) (H[facility])    Chronic, ongoing.  Followed by [facility] nephrology, continue this collaboration.  Recent  note reviewed and medication change.  Check CMP today.        Other   Vitamin D  deficiency    Labs ordered at visit today.  Will make recommendations based on lab results.        Relevant Orders   Vitamin D (25 hydroxy)   Anxiety    Chronic.  Controlled.  Continue with current medication regimen.  Uses PRN Klonopin.  Does not need a refill today.  Labs ordered today.  Return to clinic in 6 months for reevaluation.  Call sooner if concerns arise.        B12 deficiency anemia    Last B12 was elevated.  Hematology would like to wait 3 months for injection.  Labs ordered today.  3 month injection would be in March.  Will plan for B12 at that time unless labs are low.       Relevant Orders   CBC w/Diff   B12     Follow up plan: Return in about 4 months (around 03/30/2023) for HTN, HLD, DM2 FU.

## 2022-11-29 NOTE — Assessment & Plan Note (Signed)
Last B12 was elevated.  Hematology would like to wait 3 months for injection.  Labs ordered today.  3 month injection would be in March.  Will plan for B12 at that time unless labs are low.

## 2022-11-29 NOTE — Assessment & Plan Note (Signed)
Chronic, ongoing.  Followed by [facility] nephrology, continue this collaboration.  Recent note reviewed and medication change.  Check CMP today.

## 2022-11-29 NOTE — Assessment & Plan Note (Signed)
Chronic.  Controlled.  Continue with current medication regimen of enalapril and Chlorthalidone.  Medications are prescribed by [facility].  Labs ordered today.  Return to clinic in 6 months for reevaluation.  Call sooner if concerns arise.

## 2022-11-29 NOTE — Assessment & Plan Note (Signed)
Chronic.  Controlled.  Continue with current medication regimen.  Uses PRN Klonopin.  Does not need a refill today.  Labs ordered today.  Return to clinic in 6 months for reevaluation.  Call sooner if concerns arise.

## 2022-11-30 LAB — VITAMIN B12: Vitamin B-12: 949 pg/mL (ref 232–1245)

## 2022-11-30 LAB — CBC WITH DIFFERENTIAL/PLATELET
Basophils Absolute: 0.1 10*3/uL (ref 0.0–0.2)
Basos: 1 %
EOS (ABSOLUTE): 0.2 10*3/uL (ref 0.0–0.4)
Eos: 1 %
Hematocrit: 32.6 % — ABNORMAL LOW (ref 34.0–46.6)
Hemoglobin: 11 g/dL — ABNORMAL LOW (ref 11.1–15.9)
Immature Grans (Abs): 0 10*3/uL (ref 0.0–0.1)
Immature Granulocytes: 0 %
Lymphocytes Absolute: 1.8 10*3/uL (ref 0.7–3.1)
Lymphs: 16 %
MCH: 32.1 pg (ref 26.6–33.0)
MCHC: 33.7 g/dL (ref 31.5–35.7)
MCV: 95 fL (ref 79–97)
Monocytes Absolute: 1.6 10*3/uL — ABNORMAL HIGH (ref 0.1–0.9)
Monocytes: 15 %
Neutrophils Absolute: 7.1 10*3/uL — ABNORMAL HIGH (ref 1.4–7.0)
Neutrophils: 67 %
Platelets: 527 10*3/uL — ABNORMAL HIGH (ref 150–450)
RBC: 3.43 x10E6/uL — ABNORMAL LOW (ref 3.77–5.28)
RDW: 13.1 % (ref 11.7–15.4)
WBC: 10.7 10*3/uL (ref 3.4–10.8)

## 2022-11-30 LAB — IRON,TIBC AND FERRITIN PANEL
Ferritin: 68 ng/mL (ref 15–150)
Iron Saturation: 10 % — ABNORMAL LOW (ref 15–55)
Iron: 36 ug/dL (ref 27–139)
Total Iron Binding Capacity: 347 ug/dL (ref 250–450)
UIBC: 311 ug/dL (ref 118–369)

## 2022-11-30 LAB — COMPREHENSIVE METABOLIC PANEL
ALT: 9 IU/L (ref 0–32)
AST: 19 IU/L (ref 0–40)
Albumin/Globulin Ratio: 1.8 (ref 1.2–2.2)
Albumin: 4.3 g/dL (ref 3.9–4.9)
Alkaline Phosphatase: 97 IU/L (ref 44–121)
BUN/Creatinine Ratio: 21 (ref 12–28)
BUN: 28 mg/dL — ABNORMAL HIGH (ref 8–27)
Bilirubin Total: <0.2 mg/dL (ref 0.0–1.2)
CO2: 22 mmol/L (ref 20–29)
Calcium: 9.4 mg/dL (ref 8.7–10.3)
Chloride: 102 mmol/L (ref 96–106)
Creatinine, Ser: 1.36 mg/dL — ABNORMAL HIGH (ref 0.57–1.00)
Globulin, Total: 2.4 g/dL (ref 1.5–4.5)
Glucose: 77 mg/dL (ref 70–99)
Potassium: 4.7 mmol/L (ref 3.5–5.2)
Sodium: 139 mmol/L (ref 134–144)
Total Protein: 6.7 g/dL (ref 6.0–8.5)
eGFR: 43 mL/min/{1.73_m2} — ABNORMAL LOW (ref >59)

## 2022-11-30 LAB — VITAMIN D 25 HYDROXY (VIT D DEFICIENCY, FRACTURES): Vit D, 25-Hydroxy: 31.9 ng/mL (ref 30.0–100.0)

## 2022-11-30 NOTE — Progress Notes (Signed)
[name], [name] ([id number]) [id number] Page 1 of 9 Visit Report for 11/29/2022 Arrival Information Details Patient Name: Date of Service: [name]. 11/29/2022 8:15 A M Medical Record Number: [id number] Patient Account Number: [id number] Date of Birth/Sex: Treating RN: October 29, 1955 (67 y.o. [name] Primary Care Telitha Plath: Jon Billings Other Clinician: Massie Kluver Referring Sapna Padron: Treating Karas Pickerill/Extender: Venia Minks in Treatment: 13 Visit Information History Since Last Visit All ordered tests and consults were completed: No Patient Arrived: Ambulatory Added or deleted any medications: No Arrival Time: 08:10 Any new allergies or adverse reactions: No Transfer Assistance: None Had a fall or experienced change in No Patient Identification Verified: Yes activities of daily living that may affect Secondary Verification Process Completed: Yes risk of falls: Patient Requires Transmission-Based Precautions: No Signs or symptoms of abuse/neglect since last visito No Patient Has Alerts: Yes Hospitalized since last visit: No Patient Alerts: NOT Diabetic Implantable device outside of the clinic excluding No cellular tissue based products placed in the center since last visit: Has Dressing in Place as Prescribed: Yes Pain Present Now: No Electronic Signature(s) Signed: 11/30/2022 1:48:57 PM By: Massie Kluver Entered By: Massie Kluver on 11/29/2022 08:10:36 -------------------------------------------------------------------------------- Clinic Level of Care Assessment Details Patient Name: Date of Service: [name] 11/29/2022 8:15 A M Medical Record Number: [id number] Patient Account Number: [id number] Date of Birth/Sex: Treating RN: [birth year] (67 y.o. [name] Primary Care Marna Weniger: Jon Billings Other Clinician: Massie Kluver Referring Nohemi Nicklaus: Treating Americus Scheurich/Extender: Venia Minks in Treatment: 13 Clinic Level of Care Assessment Items TOOL 4 Quantity Score []$  - 0 Use when only an EandM is performed on FOLLOW-UP visit ASSESSMENTS - Nursing Assessment / Reassessment X- 1 10 Reassessment of Co-morbidities (includes updates in patient status) X- 1 5 Reassessment of Adherence to Treatment Plan [name], [name] ([id number]) [id number] Page 2 of 9 ASSESSMENTS - Wound and Skin A ssessment / Reassessment X - Simple Wound Assessment / Reassessment - one wound 1 5 []$  - 0 Complex Wound Assessment / Reassessment - multiple wounds []$  - 0 Dermatologic / Skin Assessment (not related to wound area) ASSESSMENTS - Focused Assessment []$  - 0 Circumferential Edema Measurements - multi extremities []$  - 0 Nutritional Assessment / Counseling / Intervention []$  - 0 Lower Extremity Assessment (monofilament, tuning fork, pulses) []$  - 0 Peripheral Arterial Disease Assessment (using hand held doppler) ASSESSMENTS - Ostomy and/or Continence Assessment and Care []$  - 0 Incontinence Assessment and Management []$  - 0 Ostomy Care Assessment and Management (repouching, etc.) PROCESS - Coordination of Care X - Simple Patient / Family Education for ongoing care 1 15 []$  - 0 Complex (extensive) Patient / Family Education for ongoing care []$  - 0 Staff obtains Programmer, systems, Records, T Results / Process Orders est []$  - 0 Staff telephones HHA, Nursing Homes / Clarify orders / etc []$  - 0 Routine Transfer to another Facility (non-emergent condition) []$  - 0 Routine Hospital Admission (non-emergent condition) []$  - 0 New Admissions / Biomedical engineer / Ordering NPWT Apligraf, etc. , []$  - 0 Emergency Hospital Admission (emergent condition) X- 1 10 Simple Discharge Coordination []$  - 0 Complex (extensive) Discharge Coordination PROCESS - Special Needs []$  - 0 Pediatric / Minor Patient Management []$  - 0 Isolation Patient  Management []$  - 0 Hearing / Language / Visual special needs []$  - 0 Assessment of Community assistance (transportation, D/C planning, etc.) []$  - 0 Additional assistance / Altered mentation []$  - 0 Support Surface(s) Assessment (  bed, cushion, seat, etc.) INTERVENTIONS - Wound Cleansing / Measurement X - Simple Wound Cleansing - one wound 1 5 []$  - 0 Complex Wound Cleansing - multiple wounds X- 1 5 Wound Imaging (photographs - any number of wounds) []$  - 0 Wound Tracing (instead of photographs) X- 1 5 Simple Wound Measurement - one wound []$  - 0 Complex Wound Measurement - multiple wounds INTERVENTIONS - Wound Dressings []$  - 0 Small Wound Dressing one or multiple wounds X- 1 15 Medium Wound Dressing one or multiple wounds []$  - 0 Large Wound Dressing one or multiple wounds X- 1 5 Application of Medications - topical []$  - 0 Application of Medications - injection INTERVENTIONS - Miscellaneous []$  - 0 External ear exam [name], [name] ([id number]) [id number] Page 3 of 9 []$  - 0 Specimen Collection (cultures, biopsies, blood, body fluids, etc.) []$  - 0 Specimen(s) / Culture(s) sent or taken to Lab for analysis []$  - 0 Patient Transfer (multiple staff / Harrel Lemon Lift / Similar devices) []$  - 0 Simple Staple / Suture removal (25 or less) []$  - 0 Complex Staple / Suture removal (26 or more) []$  - 0 Hypo / Hyperglycemic Management (close monitor of Blood Glucose) []$  - 0 Ankle / Brachial Index (ABI) - do not check if billed separately X- 1 5 Vital Signs Has the patient been seen at the hospital within the last three years: Yes Total Score: 85 Level Of Care: New/Established - Level 3 Electronic Signature(s) Signed: 11/30/2022 1:48:57 PM By: Massie Kluver Entered By: Massie Kluver on 11/29/2022 08:40:27 -------------------------------------------------------------------------------- Encounter Discharge Information Details Patient Name: Date of  Service: [name]. 11/29/2022 8:15 A M Medical Record Number: [id number] Patient Account Number: [id number] Date of Birth/Sex: Treating RN: Jul 04, 1956 (67 y.o. [name] Primary Care Osias Resnick: Jon Billings Other Clinician: Massie Kluver Referring Rudra Hobbins: Treating Chanoch Mccleery/Extender: Venia Minks in Treatment: 13 Encounter Discharge Information Items Discharge Condition: Stable Ambulatory Status: Ambulatory Discharge Destination: Home Transportation: Private Auto Accompanied By: self Schedule Follow-up Appointment: Yes Clinical Summary of Care: Electronic Signature(s) Signed: 11/30/2022 1:48:57 PM By: Massie Kluver Entered By: Massie Kluver on 11/29/2022 08:51:19 Lower Extremity Assessment Details -------------------------------------------------------------------------------- [name] ([id number]) [id number] Page 4 of 9 Patient Name: Date of Service: [name]. 11/29/2022 8:15 A M Medical Record Number: [id number] Patient Account Number: [id number] Date of Birth/Sex: Treating RN: [birth year] (67 y.o. [name] Primary Care Dosha Broshears: Jon Billings Other Clinician: Massie Kluver Referring Anelise Staron: Treating Umer Harig/Extender: Venia Minks in Treatment: 13 Electronic Signature(s) Signed: 11/30/2022 1:48:57 PM By: Massie Kluver Signed: 11/30/2022 1:49:05 PM By: Gretta Cool, BSN, RN, CWS, [name] RN, BSN Entered By: Massie Kluver on 11/29/2022 [id number] -------------------------------------------------------------------------------- Multi Wound Chart Details Patient Name: Date of Service: [name]. 11/29/2022 8:15 A M Medical Record Number: [id number] Patient Account Number: [id number] Date of Birth/Sex: Treating RN: May 06, 1956 (67 y.o. [name] Primary Care Nassim Cosma: Jon Billings Other Clinician: Massie Kluver Referring Jessamyn Watterson: Treating  Mende Biswell/Extender: Venia Minks in Treatment: 13 Vital Signs Height(in): 41 Pulse(bpm): 45 Weight(lbs): 180 Blood Pressure(mmHg): 138/80 Body Mass Index(BMI): 29 Temperature(F): 98.5 Respiratory Rate(breaths/min): 16 [2:Photos:] [N/A:N/A] Left, Distal Upper Leg N/A N/A Wound Location: Trauma N/A N/A Wounding Event: Trauma, Other N/A N/A Primary Etiology: Anemia, Hypertension, Lupus N/A N/A Comorbid History: Erythematosus, Osteoarthritis, Received Radiation 08/11/2022 N/A N/A Date Acquired: 13 N/A N/A Weeks of Treatment: Open N/A N/A Wound Status: No N/A N/A Wound Recurrence: 1.5x2x0.1 N/A N/A  Measurements L x W x D (cm) 2.356 N/A N/A A (cm) : rea 0.236 N/A N/A Volume (cm) : 97.60% N/A N/A % Reduction in Area: 100.00% N/A N/A % Reduction in Volume: Full Thickness With Exposed Support N/A N/A Classification: Structures Medium N/A N/A Exudate Amount: Sanguinous N/A N/A Exudate Type: red N/A N/A Exudate Color: Large (67-100%) N/A N/A Granulation Amount: Red N/A N/A Granulation Quality: None Present (0%) N/A N/A Necrotic Amount: Fat Layer (Subcutaneous Tissue): Yes N/A N/A Exposed StructuresMICKI, [name] ([id number]) [id number] Page 5 of 9 Medium (34-66%) N/A N/A Epithelialization: Treatment Notes Electronic Signature(s) Signed: 11/30/2022 1:48:57 PM By: Massie Kluver Entered By: Massie Kluver on 11/29/2022 08:18:31 -------------------------------------------------------------------------------- Multi-Disciplinary Care Plan Details Patient Name: Date of Service: [name]. 11/29/2022 8:15 A M Medical Record Number: [id number] Patient Account Number: [id number] Date of Birth/Sex: Treating RN: 1955/11/25 (67 y.o. [name], [name] Primary Care Keona Sheffler: Jon Billings Other Clinician: Massie Kluver Referring Bellarose Burtt: Treating Merlen Gurry/Extender: Venia Minks in Treatment: 13 Active Inactive Wound/Skin Impairment Nursing Diagnoses: Impaired tissue integrity Knowledge deficit related to ulceration/compromised skin integrity Goals: Patient/caregiver will verbalize understanding of skin care regimen Date Initiated: 08/24/2022 Target Resolution Date: 08/24/2022 Goal Status: Active Ulcer/skin breakdown will have a volume reduction of 30% by week 4 Date Initiated: 08/24/2022 Date Inactivated: 10/16/2022 Target Resolution Date: 09/21/2022 Goal Status: Met Ulcer/skin breakdown will have a volume reduction of 50% by week 8 Date Initiated: 10/16/2022 Target Resolution Date: 10/23/2022 Goal Status: Active Interventions: Assess patient/caregiver ability to obtain necessary supplies Assess ulceration(s) every visit Treatment Activities: Referred to DME Nandika Stetzer for dressing supplies : 08/24/2022 Skin care regimen initiated : 08/24/2022 Topical wound management initiated : 08/24/2022 Notes: Electronic Signature(s) Signed: 11/30/2022 1:48:57 PM By: Massie Kluver Signed: 11/30/2022 1:49:05 PM By: Gretta Cool, BSN, RN, CWS, [name] RN, BSN Entered By: Massie Kluver on 11/29/2022 08:41:39 [name] ([id number]) [id number] Page 6 of 9 -------------------------------------------------------------------------------- Pain Assessment Details Patient Name: Date of Service: [name]. 11/29/2022 8:15 A M Medical Record Number: [id number] Patient Account Number: [id number] Date of Birth/Sex: Treating RN: [birth year] (67 y.o. [name] Primary Care Larrissa Stivers: Jon Billings Other Clinician: Massie Kluver Referring Sabien Umland: Treating [name]/Extender: Venia Minks in Treatment: 13 Active Problems Location of Pain Severity and Description of Pain Patient Has Paino No Site Locations Pain Management and Medication Current Pain Management: Electronic Signature(s) Signed: 11/30/2022  1:48:57 PM By: Massie Kluver Signed: 11/30/2022 1:49:05 PM By: Gretta Cool, BSN, RN, CWS, [name] RN, BSN Entered By: Massie Kluver on 11/29/2022 08:13:53 -------------------------------------------------------------------------------- Patient/Caregiver Education Details Patient Name: Date of Service: [name]. 2/8/2024andnbsp8:15 [place] Record Number: [id number] Patient Account Number: [id number] Date of Birth/Gender: Treating RN: 23-Jun-1956 (67 y.o. [name] Primary Care Physician: Jon Billings Other Clinician: Massie Kluver Referring Physician: Treating Physician/Extender: Venia Minks in Treatment: [address] [place]elchina [name] ([id number]) [phone].pdf Page 7 of 9 Education Assessment Education Provided To: Patient Education Topics Provided Wound/Skin Impairment: Handouts: Other: continue wound care as directed Methods: Explain/Verbal Responses: State content correctly Electronic Signature(s) Signed: 11/30/2022 1:48:57 PM By: Massie Kluver Entered By: Massie Kluver on 11/29/2022 08:41:35 -------------------------------------------------------------------------------- Wound Assessment Details Patient Name: Date of Service: [name]. 11/29/2022 8:15 A M Medical Record Number: [id number] Patient Account Number: [id number] Date of Birth/Sex: Treating RN: 26-Nov-1955 (67 y.o. [name] Primary Care Cabrini Ruggieri: Jon Billings Other Clinician: Massie Kluver Referring Paelyn Smick: Treating Candia Kingsbury/Extender: Jeri Cos  Jon Billings Weeks in Treatment: 13 Wound Status Wound Number: 2 Primary Trauma, Other Etiology: Wound Location: Left, Distal Upper Leg Wound Open Wounding Event: Trauma Status: Date Acquired: 08/11/2022 Comorbid Anemia, Hypertension, Lupus Erythematosus, Osteoarthritis, Weeks Of Treatment: 13 History: Received Radiation Clustered Wound: No Photos Wound Measurements Length:  (cm) 1.5 Width: (cm) 2 Depth: (cm) 0.1 Area: (cm) 2.356 Volume: (cm) 0.236 % Reduction in Area: 97.6% % Reduction in Volume: 100% Epithelialization: Medium (34-66%) Wound Description Classification: Full Thickness With Exposed Support S Exudate Amount: Medium Exudate Type: Sanguinous [name], [name] ([id number]) Exudate Color: red tructures Foul Odor After Cleansing: No [id number] Page 8 of 9 Wound Bed Granulation Amount: Large (67-100%) Exposed Structure Granulation Quality: Red Fat Layer (Subcutaneous Tissue) Exposed: Yes Necrotic Amount: None Present (0%) Treatment Notes Wound #2 (Upper Leg) Wound Laterality: Left, Distal Cleanser Peri-Wound Care Skin Prep Discharge Instruction: Use skin prep as directed Topical Gentamicin Discharge Instruction: Apply a very thin amount to wound Primary Dressing Hydrofera Blue Ready Transfer Foam, 2.5x2.5 (in/in) Discharge Instruction: Apply Hydrofera Blue Ready to wound bed as directed Secondary Dressing (BORDER) Zetuvit Plus SILICONE BORDER Dressing 5x5 (in/in) Discharge Instruction: Please do not put silicone bordered dressings under wraps. Use non-bordered dressing only. Secured With Compression Wrap Compression Stockings Environmental education officer) Signed: 11/30/2022 1:48:57 PM By: Massie Kluver Signed: 11/30/2022 1:49:05 PM By: Gretta Cool, BSN, RN, CWS, [name] RN, BSN Entered By: Massie Kluver on 11/29/2022 08:17:09 -------------------------------------------------------------------------------- Vitals Details Patient Name: Date of Service: [name]. 11/29/2022 8:15 A M Medical Record Number: [id number] Patient Account Number: [id number] Date of Birth/Sex: Treating RN: [birth year] (67 y.o. [name], [name] Primary Care Vashti Bolanos: Jon Billings Other Clinician: Massie Kluver Referring Elmarie Devlin: Treating Akiel Fennell/Extender: Venia Minks in Treatment: 13 Vital  Signs Time Taken: 08:11 Temperature (F): 98.5 Height (in): 66 Pulse (bpm): 82 Weight (lbs): 180 Respiratory Rate (breaths/min): 16 Body Mass Index (BMI): 29 Blood Pressure (mmHg): 138/80 Reference Range: 80 - 120 mg / dl Electronic Signature(s) Signed: 11/30/2022 1:48:57 PM By: Georgia Dom, Signed: 11/30/2022 1:48:57 PM By: Eilene Ghazi [name] ([id number]) [id number] Page 9 of 9 Entered By: Massie Kluver on 11/29/2022 08:13:47

## 2022-11-30 NOTE — Progress Notes (Signed)
Hi [name]. It was nice to see you yesterday.  Your lab work looks good.  Your anemia has improved which is great news.  Your B12 is still within normal range so we will wait until March to do your B12 injection.  Your vitamin D is also within normal range.  Kidney function looks stable.  No concerns at this time. Continue with your current medication regimen.  Follow up as discussed.  Please let me know if you have any questions.

## 2022-11-30 NOTE — Progress Notes (Signed)
ALAMEA, GOODEMOTE I ([id number]) [id number] Page 1 of 7 Visit Report for 11/29/2022 Chief Complaint Document Details Patient Name: Date of Service: [name] I. 11/29/2022 8:15 A M Medical Record Number: [id number] Patient Account Number: [id number] Date of Birth/Sex: Treating RN: [birth year] (67 y.o. [name] Primary Care Provider: Jon Billings Other Clinician: Massie Kluver Referring Provider: Treating Provider/Extender: Venia Minks in Treatment: 13 Information Obtained from: Patient Chief Complaint Right knee and left thigh ulcers secondary to a dog bite and fall Electronic Signature(s) Signed: 11/29/2022 6:29:11 PM By: [name] PA-C Entered By: [name] on 11/29/2022 08:36:37 -------------------------------------------------------------------------------- HPI Details Patient Name: Date of Service: [name] I. 11/29/2022 8:15 A M Medical Record Number: [id number] Patient Account Number: [id number] Date of Birth/Sex: Treating RN: 06-09-1956 (67 y.o. [name] Primary Care Provider: Jon Billings Other Clinician: Massie Kluver Referring Provider: Treating Provider/Extender: Venia Minks in Treatment: 13 History of Present Illness HPI Description: 08-24-2022 patient presents today for initial evaluation here in our clinic concerning issues that she has been having with wounds in 3 locations. 1 areas in the proximal left thigh this is due to a fall that she sustained on 08-11-2022. Subsequently she has then a second issue in the distal thigh as well as the right knee location. These are due to a unusual situation where she was actually leaving had just been petting her grand dog when she walked to her car. She tells me that she got dizzy and stumbled and fell and when she fell she called out. When she did this subsequently her grand dog ran over were not sure if  that he thought he was playing with her what but nonetheless ended up biting her on the left thigh as well as on the right knee. Here she had 2 areas of laceration which she subsequently went to the ER for and they attempted to suture both closed. Unfortunately that just did not take. At this point I think the sutures can need to be removed from both locations and to be honest there is a significant area of necrosis on the left thigh this may need to be cleaned out and away. Fortunately I do not see any signs of systemic infection though locally there is some evidence of infection she has been on Augmentin which just recently switched to doxycycline no cultures been performed at this point. From the standpoint of past medical history the patient does have a history of anemia which they believe is related to B12. She does not have diabetes she does have a history of lupus however. She is a former smoker. 11/7; this is a patient who has a severe dog bite injury on her upper anterior left thigh. Also an area on the right medial knee. We have been seeing her daily and using Dakin's wet-to-dry. I was shown yesterday the culture of drainage that was done last week that shows Pseudomonas. She has multiple drug interactions with quinolones therefore I VONNE, [name] I ([id number]) [id number] Page 2 of 7 gave her a third-generation cephalosporin. HOWEVER apparently the pharmacy would not fill the prescription because of the concerns about allergic reaction to the capsule itself related to beef products. I could not find this anywhere even on Google however she has taken capsule antibiotics in the past and I told her I think that this risk is small compared to not treating the Pseudomonas. She has not been systemically unwell she is  in some discomfort 09-04-2022 upon evaluation today patient unfortunately he is continuing to have some significant issues here with her leg although I  do feel like she is better than she was last week. Fortunately there does not appear to be any evidence of systemic infection though locally we definitely noted Pseudomonas as the causative agent on the left thigh location with a culture that needed from the deep tissue wound area. Subsequently the only active antibiotics that are going to take care of this are Cipro, Levaquin, and Baxdela. Unfortunately the Cipro and Levaquin cannot be taken by her due to the interaction with Plaquenil causing a QT prolongation which is good to be a significant issue. I do not want that to be a complicating factor and therefore we need to look toward the [place] which I understand is definitely expensive but I am not sure if there is another option to be perfectly honest. I discussed this with the patient today and she is in agreement with plan we have also made a referral to infectious disease though if we can get this treated with Baxdela then she may not even end up requiring the infectious disease referral. Upon inspection patient's wound bed actually showed signs of 09-11-2022 upon evaluation today patient appears to be doing well currently in regard to her wound. She is actually making progress and we actually did find out today that we did get the [place] approved. This is great news as I think it will probably prevent her from having to go to the infectious disease doctors will get the infection under control as it stands right now. She is very pleased to hear this and I likewise I am extremely pleased. It still can be quite pricey it is good to be $350 with her insurance but that is better than close to 2000 without. 09-17-2022 upon evaluation today patient appears to be doing well currently in regard to her wounds. She has been tolerating the dressing changes without complication. Fortunately I do not see any evidence of infection at this point which is great news we still had a tremendously difficult period  of time trying to get the [place] approved and then once we got it approved apparently the pharmacy was unable to actually order the medication. This has been an extremely frustrating process in general. The patient has been extremely frustrated. 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 Nonetheless I do believe that this is still the best way to go with the topical gentamicin. 09-24-2022 upon evaluation today patient appears to be doing well with regard to her wounds she is actually making excellent progress and I think we are ready to switch to [facility] for the left leg as well we have already been using this on the right knee with great results. 10-01-2022 upon evaluation today patient actually appears to be doing excellent. She has been tolerating the [facility] and an excellent fashion without complication and overall I am extremely pleased with where we stand today. There does not appear to be any signs of active infection locally nor systemically at this time. No fevers, chills, nausea, vomiting, or diarrhea. 10-09-2022 upon evaluation today patient appears to be doing well currently in regard to her wounds. Both are showing signs  of excellent improvement there are some hypergranulation noted at both locations but other than that she really is doing quite well. 12/26; patient continues to have improvement in both wounds. The right medial lower leg almost closed the left upper thigh reduced nicely in dimensions. Using [facility] and border foam 11-05-2022 upon evaluation today patient actually appears to be making excellent progress. I am very pleased with where we stand today. She has been using the [facility] the right leg ulcer actually appears to be healed the  left leg ulcer on the thigh is significantly improved though not completely closed as of yet. 1/25; remarkable improvement. Using Hydrofera Blue. For some reason the patient stopped using Hydrofera Blue sometime since the last time we saw her and just started using AandE ointment. The wound still looks quite good however I reiterated I thought it was beneficial to continue with the Hydrofera Blue change every second day 11-29-2022 upon evaluation today patient actually appears to be doing well her right knee is still healed the left thigh is actually smaller the last time that we saw her. Fortunately I do not see any evidence of active infection at this time locally nor systemically which is great news. Electronic Signature(s) Signed: 11/29/2022 10:38:09 AM By: [name] PA-C Entered By: [name] on 11/29/2022 10:38:08 -------------------------------------------------------------------------------- Physical Exam Details Patient Name: Date of Service: [name] I. 11/29/2022 8:15 A M Medical Record Number: [id number] Patient Account Number: [id number] Date of Birth/Sex: Treating RN: 1955-12-26 (67 y.o. [name] Primary Care Provider: Jon Billings Other Clinician: Massie Kluver Referring Provider: Treating Provider/Extender: Venia Minks in Treatment: 13 Psychiatric this patient is able to make decisions and demonstrates good insight into disease process. Alert and Oriented x 3. pleasant and cooperative. Notes Upon inspection patient's thigh ulcer is actually showing signs of excellent improvement in fact there is no need for sharp debridement at all point and she seems to be tolerating the dressing changes without complication. at this no fevers, chills, nausea, vomiting, or diarrhea. KINZLEIGH, ALTMAN I ([id number]) [id number] Page 3 of 7 Electronic Signature(s) Signed: 11/29/2022 10:40:55 AM By: [name]  PA-C Entered By: [name] on 11/29/2022 10:40:54 -------------------------------------------------------------------------------- Physician Orders Details Patient Name: Date of Service: [name] I. 11/29/2022 8:15 A M Medical Record Number: [id number] Patient Account Number: [id number] Date of Birth/Sex: Treating RN: 02-04-1956 (67 y.o. 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 Nurse Visit as needed Edema Control - Lymphedema / Segmental Compressive Device / Other Elevate, Exercise Daily and A void Standing for Long Periods of Time. Elevate legs to the level of the heart and pump ankles as often as possible Elevate leg(s) parallel to the floor when sitting. Wound Treatment Wound #2 - Upper Leg Wound Laterality: Left, Distal Peri-Wound Care:  Skin Prep 3 x Per Week/30 Days Discharge Instructions: Use skin prep as directed Topical: Gentamicin 3 x Per Week/30 Days Discharge Instructions: Apply a very thin amount to wound Prim Dressing: Hydrofera Blue Ready Transfer Foam, 2.5x2.5 (in/in) (Dispense As Written) 3 x Per Week/30 Days ary Discharge Instructions: Apply  Hydrofera Blue Ready to wound bed as directed Secondary Dressing: (BORDER) Zetuvit Plus SILICONE BORDER Dressing 5x5 (in/in) (Dispense As Written) 3 x Per Week/30 Days Discharge Instructions: Please do not put silicone bordered dressings under wraps. Use non-bordered dressing only. Electronic Signature(s) Signed: 11/29/2022 6:29:11 PM By: [name] PA-C Signed: 11/30/2022 1:48:57 PM By: Massie Kluver Entered By: Massie Kluver on 11/29/2022 08:40:00 Allayne Stack I ([id number]) [id number] Page 4 of 7 -------------------------------------------------------------------------------- Problem List Details Patient Name: Date of Service: [name] I. 11/29/2022 8:15 A M Medical Record Number: [id number] Patient Account Number: [id number] Date of Birth/Sex: Treating RN: 1956/04/18 (67 y.o. [name] Primary Care Provider: Jon Billings Other Clinician: Massie Kluver Referring Provider: Treating Provider/Extender: Venia Minks in Treatment: 13 Active Problems ICD-10 Encounter Code Description Active Date MDM Diagnosis W54.0XXA Bitten by dog, initial encounter 08/24/2022 No Yes L03.116 Cellulitis of left lower limb 09/04/2022 No Yes S80.12XA Contusion of left lower leg, initial encounter 08/24/2022 No Yes T79.2XXA Traumatic secondary and recurrent hemorrhage and seroma, initial encounter 08/24/2022 No Yes S80.812A Abrasion, left lower leg, initial encounter 08/24/2022 No Yes L97.823 Non-pressure chronic ulcer of other part of left lower leg with necrosis of 08/24/2022 No Yes muscle L98.492 Non-pressure chronic ulcer of skin of other sites with fat layer exposed 08/24/2022 No Yes D51.3 Other dietary vitamin B12 deficiency anemia 08/24/2022 No Yes M32.8 Other forms of systemic lupus erythematosus 08/24/2022 No Yes Inactive Problems Resolved Problems Electronic Signature(s) Signed: 11/29/2022 6:29:11 PM By: [name]  PA-C Entered By: [name] on 11/29/2022 08:36:33 Allayne Stack I ([id number]) [id number] Page 5 of 7 -------------------------------------------------------------------------------- Progress Note Details Patient Name: Date of Service: [name] I. 11/29/2022 8:15 A M Medical Record Number: [id number] Patient Account Number: [id number] Date of Birth/Sex: Treating RN: 1955/12/01 (67 y.o. [name] Primary Care Provider: Jon Billings Other Clinician: Massie Kluver Referring Provider: Treating Provider/Extender: Venia Minks in Treatment: 13 Subjective Chief Complaint Information obtained from Patient Right knee and left thigh ulcers secondary to a dog bite and fall History of Present Illness (HPI) 08-24-2022 patient presents today for initial evaluation here in our clinic concerning issues that she has been having with wounds in 3 locations. 1 areas in the proximal left thigh this is due to a fall that she sustained on 08-11-2022. Subsequently she has then a second issue in the distal thigh as well as the right knee location. These are due to a unusual situation where she was actually leaving had just been petting her grand dog when she walked to her car. She tells me that she got dizzy and stumbled and fell and when she fell she called out. When she did this subsequently her grand dog ran over were not sure if that he thought he was playing with her what but nonetheless ended up biting her on the left thigh as well as on the right knee. Here she had 2 areas of laceration which she subsequently went to the ER for and they attempted to suture both closed. Unfortunately that just did not take. At this point I think the sutures can need to be removed from  both locations and to be honest there is a significant area of necrosis on the left thigh this may need to be cleaned out and away. Fortunately I do not see any signs  of systemic infection though locally there is some evidence of infection she has been on Augmentin which just recently switched to doxycycline no cultures been performed at this point. From the standpoint of past medical history the patient does have a history of anemia which they believe is related to B12. She does not have diabetes she does have a history of lupus however. She is a former smoker. 11/7; this is a patient who has a severe dog bite injury on her upper anterior left thigh. Also an area on the right medial knee. We have been seeing her daily and using Dakin's wet-to-dry. I was shown yesterday the culture of drainage that was done last week that shows Pseudomonas. She has multiple drug interactions with quinolones therefore I gave her a third-generation cephalosporin. HOWEVER apparently the pharmacy would not fill the prescription because of the concerns about allergic reaction to the capsule itself related to beef products. I could not find this anywhere even on Google however she has taken capsule antibiotics in the past and I told her I think that this risk is small compared to not treating the Pseudomonas. She has not been systemically unwell she is in some discomfort 09-04-2022 upon evaluation today patient unfortunately he is continuing to have some significant issues here with her leg although I do feel like she is better than she was last week. Fortunately there does not appear to be any evidence of systemic infection though locally we definitely noted Pseudomonas as the causative agent on the left thigh location with a culture that needed from the deep tissue wound area. Subsequently the only active antibiotics that are going to take care of this are Cipro, Levaquin, and Baxdela. Unfortunately the Cipro and Levaquin cannot be taken by her due to the interaction with Plaquenil causing a QT prolongation which is good to be a significant issue. I do not want that to be a complicating  factor and therefore we need to look toward the [place] which I understand is definitely expensive but I am not sure if there is another option to be perfectly honest. I discussed this with the patient today and she is in agreement with plan we have also made a referral to infectious disease though if we can get this treated with Baxdela then she may not even end up requiring the infectious disease referral. Upon inspection patient's wound bed actually showed signs of 09-11-2022 upon evaluation today patient appears to be doing well currently in regard to her wound. She is actually making progress and we actually did find out today that we did get the [place] approved. This is great news as I think it will probably prevent her from having to go to the infectious disease doctors will get the infection under control as it stands right now. She is very pleased to hear this and I likewise I am extremely pleased. It still can be quite pricey it is good to be $350 with her insurance but that is better than close to 2000 without. 09-17-2022 upon evaluation today patient appears to be doing well currently in regard to her wounds. She has been tolerating the dressing changes without complication. Fortunately I do not see any evidence of infection at this point which is great news we still had a tremendously difficult period  of time trying to get the [place] approved and then once we got it approved apparently the pharmacy was unable to actually order the medication. This has been an extremely frustrating process in general. The patient has been extremely frustrated. 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 Nonetheless I do believe that this is still the best way to go with the topical  gentamicin. 09-24-2022 upon evaluation today patient appears to be doing well with regard to her wounds she is actually making excellent progress and I think we are ready to switch to [facility] for the left leg as well we have already been using this on the right knee with great results. 10-01-2022 upon evaluation today patient actually appears to be doing excellent. She has been tolerating the [facility] and an excellent fashion without complication and overall I am extremely pleased with where we stand today. There does not appear to be any signs of active infection locally nor systemically at this time. No fevers, chills, nausea, vomiting, or diarrhea. 10-09-2022 upon evaluation today patient appears to be doing well currently in regard to her wounds. Both are showing signs of excellent improvement there are some hypergranulation noted at both locations but other than that she really is doing quite well. 12/26; patient continues to have improvement in both wounds. The right medial lower leg almost closed the left upper thigh reduced nicely in dimensions. Using [facility] and border foam 11-05-2022 upon evaluation today patient actually appears to be making excellent progress. I am very pleased with where we stand today. She has been using the [facility] the right leg ulcer actually appears to be healed the left leg ulcer on the thigh is significantly improved though not completely closed as of yet. ISADORA, ROYLANCE I ([id number]) [id number] Page 6 of 7 1/25; remarkable improvement. Using Hydrofera Blue. For some reason the patient stopped using Hydrofera Blue sometime since the last time we saw her and just started using AandE ointment. The wound still looks quite good however I reiterated I thought it was beneficial to continue with the Hydrofera Blue change every second day 11-29-2022 upon evaluation today patient actually appears to be doing well her  right knee is still healed the left thigh is actually smaller the last time that we saw her. Fortunately I do not see any evidence of active infection at this time locally nor systemically which is great news. Objective Constitutional Vitals Time Taken: 8:11 AM, Height: 66 in, Weight: 180 lbs, BMI: 29, Temperature: 98.5 F, Pulse: 82 bpm, Respiratory Rate: 16 breaths/min, Blood Pressure: 138/80 mmHg. Psychiatric this patient is able to make decisions and demonstrates good insight into disease process. Alert and Oriented x 3. pleasant and cooperative. General Notes: Upon inspection patient's thigh ulcer is actually showing signs of excellent improvement in fact there is no need for sharp debridement at all point and she seems to be tolerating the dressing changes without complication. at this no fevers, chills, nausea, vomiting, or diarrhea. Integumentary (Hair, Skin) Wound #2 status is Open. Original cause of wound was Trauma. The date acquired was: 08/11/2022. The wound has been in treatment 13 weeks. The wound is located on the Left,Distal Upper Leg. The wound measures 1.5cm length x 2cm width x 0.1cm depth; 2.356cm^2  area and 0.236cm^3 volume. There is Fat Layer (Subcutaneous Tissue) exposed. There is a medium amount of sanguinous drainage noted. There is large (67-100%) red granulation within the wound bed. There is no necrotic tissue within the wound bed. 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 Nurse Visit as needed Edema Control - Lymphedema / Segmental Compressive Device / Other: Elevate, Exercise Daily and Avoid Standing for Long Periods of Time. Elevate legs to the level of the heart and pump ankles as often as possible Elevate leg(s) parallel to the floor when sitting. WOUND #2: - Upper Leg Wound Laterality: Left, Distal Peri-Wound Care: Skin Prep 3 x Per Week/30 Days Discharge Instructions: Use skin prep as directed Topical: Gentamicin 3 x Per Week/30 Days Discharge Instructions: Apply a very thin amount to wound Prim Dressing: Hydrofera Blue Ready Transfer Foam, 2.5x2.5 (in/in) (Dispense As Written) 3 x Per Week/30 Days ary Discharge Instructions: Apply Hydrofera Blue Ready to wound bed as directed Secondary Dressing: (BORDER) Zetuvit Plus SILICONE BORDER Dressing 5x5 (in/in) (Dispense As Written) 3 x Per Week/30 Days Discharge Instructions: Please do not put silicone bordered dressings under wraps. Use non-bordered dressing only. 1. Would recommend currently that the patient should continue to monitor for any signs of infection or worsening. Obviously right now she is doing quite well I am going to suggest she continue to use the gentamicin that just a little bit over the top of her wound on the knee and then subsequently cover this with the Hydrofera Blue. 2. I am also can recommend she continue to change this 3 times per week which I think is doing well. We will see patient back for reevaluation in 1 week here in the clinic. If anything worsens or changes patient will contact our office for additional recommendations. VYSHNAVI, CIFELLI I ([id number]) [id number] Page 7 of 7 Electronic Signature(s) Signed: 11/29/2022 10:41:24 AM By: [name] PA-C Entered By: [name] on 11/29/2022 10:41:24 -------------------------------------------------------------------------------- SuperBill  Details Patient Name: Date of Service: [name] I. 11/29/2022 Medical Record Number: [id number] Patient Account Number: [id number] Date of Birth/Sex: Treating RN: 08/20/1956 (67 y.o. 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.3 Other dietary vitamin B12 deficiency anemia M32.8 Other forms of systemic lupus erythematosus Facility Procedures : CPT4 Code: [id number] Description: [id number] - WOUND CARE VISIT-LEV 3 EST PT Modifier: Quantity: 1 Physician Procedures : CPT4 Code Description Modifier [id number] - WC PHYS LEVEL 3 - EST PT ICD-10 Diagnosis Description W54.0XXA Bitten by dog, initial encounter L03.116 Cellulitis of left lower limb S80.12XA  Contusion of left lower leg, initial encounter T79.2XXA  Traumatic secondary and recurrent hemorrhage and seroma, initial encounter Quantity: 1 Electronic Signature(s) Signed: 11/29/2022 10:41:46 AM By: [name] PA-C Entered By: [name] on 11/29/2022 10:41:46

## 2022-12-06 ENCOUNTER — Encounter: Payer: Medicare Other | Admitting: Physician Assistant

## 2022-12-06 DIAGNOSIS — L98492 Non-pressure chronic ulcer of skin of other sites with fat layer exposed: Secondary | ICD-10-CM | POA: Diagnosis not present

## 2022-12-07 NOTE — Progress Notes (Signed)
Kristine, [name] [name] ([id number]) [id number] Page 1 of 7 Visit Report for 12/06/2022 Chief Complaint Document Details Patient Name: Date of Service: [name] 12/06/2022 3:30 PM Medical Record Number: [id number] Patient Account Number: [id number] Date of Birth/Sex: Treating RN: [birth year] (67 y.o. [name] Primary Care Provider: Jon [name] Other Clinician: Massie [name] Referring Provider: Treating Provider/Extender: [name] in Treatment: 14 Information Obtained from: Patient Chief Complaint Right knee and left thigh ulcers secondary to a dog bite and fall Electronic Signature(s) Signed: 12/06/2022 4:03:43 PM By: [name] PA-C Entered By: [name] on 12/06/2022 16:03:43 -------------------------------------------------------------------------------- HPI Details Patient Name: Date of Service: Kristine Mater [name]. 12/06/2022 3:30 PM Medical Record Number: [id number] Patient Account Number: [id number] Date of Birth/Sex: Treating RN: [birth year] (67 y.o. [name] Primary Care Provider: Jon [name] Other Clinician: Massie [name] Referring Provider: Treating Provider/Extender: [name] in Treatment: 14 History of Present Illness HPI Description: 08-24-2022 patient presents today for initial evaluation here in our clinic concerning issues that she has been having with wounds in 3 locations. 1 areas in the proximal left thigh this is due to a fall that she sustained on 08-11-2022. Subsequently she has then a second issue in the distal thigh as well as the right knee location. These are due to a unusual situation where she was actually leaving had just been petting her grand dog when she walked to her car. She tells me that she got dizzy and stumbled and fell and when she fell she called out. When she did this subsequently her grand dog ran over were not sure if  that he thought he was playing with her what but nonetheless ended up biting her on the left thigh as well as on the right knee. Here she had 2 areas of laceration which she subsequently went to the ER for and they attempted to suture both closed. Unfortunately that just did not take. At this point [name] think the sutures can need to be removed from both locations and to be honest there is a significant area of necrosis on the left thigh this may need to be cleaned out and away. Fortunately I do not see any signs of systemic infection though locally there is some evidence of infection she has been on Augmentin which just recently switched to doxycycline no cultures been performed at this point. From the standpoint of past medical history the patient does have a history of anemia which they believe is related to B12. She does not have diabetes she does have a history of lupus however. She is a former smoker. 11/7; this is a patient who has a severe dog bite injury on her upper anterior left thigh. Also an area on the right medial knee. We have been seeing her daily and using Dakin's wet-to-dry. [name] was shown yesterday the culture of drainage that was done last week that shows Pseudomonas. She has multiple drug interactions with quinolones therefore [name] [name], [name] ([id number]) [id number] Page 2 of 7 gave her a third-generation cephalosporin. HOWEVER apparently the pharmacy would not fill the prescription because of the concerns about allergic reaction to the capsule itself related to beef products. [name] could not find this anywhere even on Google however she has taken capsule antibiotics in the past and [name] told her [name] think that this risk is small compared to not treating the Pseudomonas. She has not been systemically unwell she is in some  discomfort 09-04-2022 upon evaluation today patient unfortunately he is continuing to have some significant issues here with her leg although I  do feel like she is better than she was last week. Fortunately there does not appear to be any evidence of systemic infection though locally we definitely noted Pseudomonas as the causative agent on the left thigh location with a culture that needed from the deep tissue wound area. Subsequently the only active antibiotics that are going to take care of this are Cipro, Levaquin, and Baxdela. Unfortunately the Cipro and Levaquin cannot be taken by her due to the interaction with Plaquenil causing a QT prolongation which is good to be a significant issue. I do not want that to be a complicating factor and therefore we need to look toward the [place] which [name] understand is definitely expensive but [name] am not sure if there is another option to be perfectly honest. [name] discussed this with the patient today and she is in agreement with plan we have also made a referral to infectious disease though if we can get this treated with Baxdela then she may not even end up requiring the infectious disease referral. Upon inspection patient's wound bed actually showed signs of 09-11-2022 upon evaluation today patient appears to be doing well currently in regard to her wound. She is actually making progress and we actually did find out today that we did get the [place] approved. This is great news as [name] think it will probably prevent her from having to go to the infectious disease doctors will get the infection under control as it stands right now. She is very pleased to hear this and [name] likewise [name] am extremely pleased. It still can be quite pricey it is good to be $350 with her insurance but that is better than close to 2000 without. 09-17-2022 upon evaluation today patient appears to be doing well currently in regard to her wounds. She has been tolerating the dressing changes without complication. Fortunately I do not see any evidence of infection at this point which is great news we still had a tremendously difficult period  of time trying to get the [place] approved and then once we got it approved apparently the pharmacy was unable to actually order the medication. This has been an extremely frustrating process in general. The patient has been extremely frustrated. 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 Nonetheless I do believe that this is still the best way to go with the topical gentamicin. 09-24-2022 upon evaluation today patient appears to be doing well with regard to her wounds she is actually making excellent progress and [name] think we are ready to switch to [facility] for the left leg as well we have already been using this on the right knee with great results. 10-01-2022 upon evaluation today patient actually appears to be doing excellent. She has been tolerating the [facility] and an excellent fashion without complication and overall [name] am extremely pleased with where we stand today. There does not appear to be any signs of active infection locally nor systemically at this time. No fevers, chills, nausea, vomiting, or diarrhea. 10-09-2022 upon evaluation today patient appears to be doing well currently in regard to her wounds. Both are showing signs of excellent  improvement there are some hypergranulation noted at both locations but other than that she really is doing quite well. 12/26; patient continues to have improvement in both wounds. The right medial lower leg almost closed the left upper thigh reduced nicely in dimensions. Using [facility] and border foam 11-05-2022 upon evaluation today patient actually appears to be making excellent progress. [name] am very pleased with where we stand today. She has been using the [facility] the right leg ulcer actually appears to be healed the  left leg ulcer on the thigh is significantly improved though not completely closed as of yet. 1/25; remarkable improvement. Using Hydrofera Blue. For some reason the patient stopped using Hydrofera Blue sometime since the last time we saw her and just started using AandE ointment. The wound still looks quite good however [name] reiterated [name] thought it was beneficial to continue with the Hydrofera Blue change every second day 11-29-2022 upon evaluation today patient actually appears to be doing well her right knee is still healed the left thigh is actually smaller the last time that we saw her. Fortunately I do not see any evidence of active infection at this time locally nor systemically which is great news. 12-06-2022 upon evaluation today patient appears to be doing well currently in regard to her wound in fact this appears to be completely healed. This is obviously good news and [name] am extremely pleased with where we stand today. I do not see any evidence of active infection locally nor systemically currently. No fevers, chills, nausea, vomiting, or diarrhea. Electronic Signature(s) Signed: 12/06/2022 4:09:17 PM By: [name] PA-C Entered By: [name] on 12/06/2022 16:09:17 -------------------------------------------------------------------------------- Physical Exam Details Patient Name: Date of Service: Kristine Mater [name]. 12/06/2022 3:30 PM Medical Record Number: [id number] Patient Account Number: [id number] Date of Birth/Sex: Treating RN: 08/30/1956 (67 y.o. [name] Primary Care Provider: Jon [name] Other Clinician: Massie [name] Referring Provider: Treating Provider/Extender: [name] in Treatment: 52 Constitutional Well-nourished and well-hydrated in no acute distress. Respiratory [name], [name] ([id number]) [id number] Page 3 of 7 normal breathing without difficulty. Psychiatric this patient is able  to make decisions and demonstrates good insight into disease process. Alert and Oriented x 3. pleasant and cooperative. Notes Upon inspection patient's wound bed actually showed signs of good granulation epithelization at this point. Fortunately [name] see no signs of active infection which is great news and overall I do believe that we are heading in the right direction here. Electronic Signature(s) Signed: 12/06/2022 4:09:35 PM By: [name] PA-C Entered By: [name] on 12/06/2022 16:09:35 -------------------------------------------------------------------------------- Physician Orders Details Patient Name: Date of Service: Kristine Mater [name]. 12/06/2022 3:30 PM Medical Record Number: [id number] Patient Account Number: [id number] Date of Birth/Sex: Treating RN: Sep 02, 1956 (67 y.o. 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.3 Other dietary vitamin B12 deficiency anemia M32.8 Other forms of systemic lupus erythematosus Discharge From [facility]  Discharge from [facility] Complete Additional Orders / Instructions Other: - ok to cover with bandaid and hydrofera blue for protection during day x 2-3 weeks apply AandD ointment to area at night Electronic Signature(s) Signed: 12/06/2022 5:39:55 PM By:  [name] PA-C Signed: 12/10/2022 4:23:47 PM By: [name] Entered By: [name] on 12/06/2022 16:07:31 [name] ([id number]) [id number] Page 4 of 7 -------------------------------------------------------------------------------- Problem List Details Patient Name: Date of Service: [name] 12/06/2022 3:30 PM Medical Record Number: [id number] Patient Account Number: [id number] Date of Birth/Sex: Treating RN: 1956/03/01 (67 y.o. [name] Primary Care Provider: Jon [name] Other Clinician: Massie [name] Referring Provider: Treating Provider/Extender: [name] in Treatment: 14 Active Problems ICD-10 Encounter Code Description Active Date MDM Diagnosis W54.0XXA Bitten by dog, initial encounter 08/24/2022 No Yes L03.116 Cellulitis of left lower limb 09/04/2022 No Yes S80.12XA Contusion of left lower leg, initial encounter 08/24/2022 No Yes T79.2XXA Traumatic secondary and recurrent hemorrhage and seroma, initial encounter 08/24/2022 No Yes S80.812A Abrasion, left lower leg, initial encounter 08/24/2022 No Yes L97.823 Non-pressure chronic ulcer of other part of left lower leg with necrosis of 08/24/2022 No Yes muscle L98.492 Non-pressure chronic ulcer of skin of other sites with fat layer exposed 08/24/2022 No Yes D51.3 Other dietary vitamin B12 deficiency anemia 08/24/2022 No Yes M32.8 Other forms of systemic lupus erythematosus 08/24/2022 No Yes Inactive Problems Resolved Problems Electronic Signature(s) Signed: 12/06/2022 4:03:37 PM By: [name] PA-C Entered By: [name] on 12/06/2022 16:03:37 [name] ([id number]) [id number] Page 5 of 7 -------------------------------------------------------------------------------- Progress Note Details Patient Name: Date of Service: Kristine Mater [name]. 12/06/2022 3:30 PM Medical Record Number:  [id number] Patient Account Number: [id number] Date of Birth/Sex: Treating RN: [birth year] (67 y.o. [name] Primary Care Provider: Jon [name] Other Clinician: Massie [name] Referring Provider: Treating Provider/Extender: [name] in Treatment: 14 Subjective Chief Complaint Information obtained from Patient Right knee and left thigh ulcers secondary to a dog bite and fall History of Present Illness (HPI) 08-24-2022 patient presents today for initial evaluation here in our clinic concerning issues that she has been having with wounds in 3 locations. 1 areas in the proximal left thigh this is due to a fall that she sustained on 08-11-2022. Subsequently she has then a second issue in the distal thigh as well as the right knee location. These are due to a unusual situation where she was actually leaving had just been petting her grand dog when she walked to her car. She tells me that she got dizzy and stumbled and fell and when she fell she called out. When she did this subsequently her grand dog ran over were not sure if that he thought he was playing with her what but nonetheless ended up biting her on the left thigh as well as on the right knee. Here she had 2 areas of laceration which she subsequently went to the ER for and they attempted to suture both closed. Unfortunately that just did not take. At this point [name] think the sutures can need to be removed from both locations and to be honest there is a significant area of necrosis on the left thigh this may need to be cleaned out and away. Fortunately I do not see any signs of systemic infection though locally there is some evidence of infection she has been on Augmentin which just recently switched to doxycycline no cultures been performed  at this point. From the standpoint of past medical history the patient does have a history of anemia which they believe is related to B12. She does not have diabetes  she does have a history of lupus however. She is a former smoker. 11/7; this is a patient who has a severe dog bite injury on her upper anterior left thigh. Also an area on the right medial knee. We have been seeing her daily and using Dakin's wet-to-dry. [name] was shown yesterday the culture of drainage that was done last week that shows Pseudomonas. She has multiple drug interactions with quinolones therefore [name] gave her a third-generation cephalosporin. HOWEVER apparently the pharmacy would not fill the prescription because of the concerns about allergic reaction to the capsule itself related to beef products. [name] could not find this anywhere even on Google however she has taken capsule antibiotics in the past and [name] told her [name] think that this risk is small compared to not treating the Pseudomonas. She has not been systemically unwell she is in some discomfort 09-04-2022 upon evaluation today patient unfortunately he is continuing to have some significant issues here with her leg although I do feel like she is better than she was last week. Fortunately there does not appear to be any evidence of systemic infection though locally we definitely noted Pseudomonas as the causative agent on the left thigh location with a culture that needed from the deep tissue wound area. Subsequently the only active antibiotics that are going to take care of this are Cipro, Levaquin, and Baxdela. Unfortunately the Cipro and Levaquin cannot be taken by her due to the interaction with Plaquenil causing a QT prolongation which is good to be a significant issue. I do not want that to be a complicating factor and therefore we need to look toward the [place] which [name] understand is definitely expensive but [name] am not sure if there is another option to be perfectly honest. [name] discussed this with the patient today and she is in agreement with plan we have also made a referral to infectious disease though if we can get this treated with  Baxdela then she may not even end up requiring the infectious disease referral. Upon inspection patient's wound bed actually showed signs of 09-11-2022 upon evaluation today patient appears to be doing well currently in regard to her wound. She is actually making progress and we actually did find out today that we did get the [place] approved. This is great news as [name] think it will probably prevent her from having to go to the infectious disease doctors will get the infection under control as it stands right now. She is very pleased to hear this and [name] likewise [name] am extremely pleased. It still can be quite pricey it is good to be $350 with her insurance but that is better than close to 2000 without. 09-17-2022 upon evaluation today patient appears to be doing well currently in regard to her wounds. She has been tolerating the dressing changes without complication. Fortunately I do not see any evidence of infection at this point which is great news we still had a tremendously difficult period of time trying to get the [place] approved and then once we got it approved apparently the pharmacy was unable to actually order the medication. This has been an extremely frustrating process in general. The patient has been extremely frustrated. Nonetheless she seems to actually be making excellent progress and in fact [name] am not even certain is good  to be necessary to use the oral antibiotics at this point which will definitely save her a lot of money which she is very pleased about and to employees. Nonetheless I do believe that this is still the best way to go with the topical gentamicin. 09-24-2022 upon evaluation today patient appears to be doing well with regard to her wounds she is actually making excellent progress and [name] think we are ready to switch to [facility] for the left leg as well we have already been using this on the right knee with great results. 10-01-2022 upon evaluation today patient actually  appears to be doing excellent. She has been tolerating the [facility] and an excellent fashion without complication and overall [name] am extremely pleased with where we stand today. There does not appear to be any signs of active infection locally nor systemically at this time. No fevers, chills, nausea, vomiting, or diarrhea. 10-09-2022 upon evaluation today patient appears to be doing well currently in regard to her wounds. Both are showing signs of excellent improvement there are some hypergranulation noted at both locations but other than that she really is doing quite well. 12/26; patient continues to have improvement in both wounds. The right medial lower leg almost closed the left upper thigh reduced nicely in dimensions. Using [facility] and border foam 11-05-2022 upon evaluation today patient actually appears to be making excellent progress. [name] am very pleased with where we stand today. She has been using the [facility] the right leg ulcer actually appears to be healed the left leg ulcer on the thigh is significantly improved though not completely closed as of yet. [name], [name] ([id number]) [id number] Page 6 of 7 1/25; remarkable improvement. Using Hydrofera Blue. For some reason the patient stopped using Hydrofera Blue sometime since the last time we saw her and just started using AandE ointment. The wound still looks quite good however [name] reiterated [name] thought it was beneficial to continue with the Hydrofera Blue change every second day 11-29-2022 upon evaluation today patient actually appears to be doing well her right knee is still healed the left thigh is actually smaller the last time that we saw her. Fortunately I do not see any evidence of active infection at this time locally nor systemically which is great news. 12-06-2022 upon evaluation today patient appears to be doing well currently in regard to her wound in fact this appears to be completely  healed. This is obviously good news and [name] am extremely pleased with where we stand today. I do not see any evidence of active infection locally nor systemically currently. No fevers, chills, nausea, vomiting, or diarrhea. Objective Constitutional Well-nourished and well-hydrated in no acute distress. Vitals Time Taken: 3:41 PM, Height: 66 in, Weight: 180 lbs, BMI: 29, Temperature: 98.3 F, Pulse: 76 bpm, Respiratory Rate: 16 breaths/min, Blood Pressure: 121/72 mmHg. Respiratory normal breathing without difficulty. Psychiatric this patient is able to make decisions and demonstrates good insight into disease process. Alert and Oriented x 3. pleasant and cooperative. General Notes: Upon inspection patient's wound bed actually showed signs of good granulation epithelization at this point. Fortunately [name] see no signs of active infection which is great news and overall I do believe that we are heading in the right direction here. Integumentary (Hair, Skin) Wound #2 status is Healed - Epithelialized. Original cause of wound was Trauma. The date acquired was: 08/11/2022. The wound has been in treatment 14 weeks. The wound is located on the Left,Distal Upper Leg. The  wound measures 0cm length x 0cm width x 0cm depth; 0cm^2 area and 0cm^3 volume. There is Fat Layer (Subcutaneous Tissue) exposed. There is a none present amount of drainage noted. There is no granulation within the wound bed. There is no necrotic tissue within the wound bed. 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 [name] am going to suggest that we have the patient continue to monitor for any signs of infection or worsening. Based on what [name] am seeing I do believe that we are headed in the right direction but I do think that the patient still needs to protect the area [name] think just a large Band-Aid during the day take it off at night and then AandD ointment to the area at night would be perfect. 2. [name] am also can recommend that she continue to monitor for any signs of worsening if she has any issues she should let me know but right now [name] am extremely pleased with where we stand. We will see patient back for reevaluation in 1 week here in the clinic. If anything worsens or changes patient will contact our office for additional recommendations. [name], [name] ([id number]) [id number] Page 7 of 7 Electronic Signature(s) Signed: 12/06/2022 4:10:11 PM By: [name] PA-C Entered By: [name] on 12/06/2022 16:10:10 -------------------------------------------------------------------------------- SuperBill Details Patient Name: Date of Service: Kristine Mater [name]. 12/06/2022 Medical Record Number: [id number] Patient Account Number: [id number] Date of Birth/Sex: Treating RN: Mar 21, 1956 (68 y.o. 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.3 Other dietary vitamin B12 deficiency anemia M32.8 Other forms of systemic lupus erythematosus Facility Procedures : CPT4 Code: [id number] Description: [id number] - WOUND CARE VISIT-LEV 2 EST PT Modifier: Quantity: 1 Physician Procedures : CPT4 Code Description Modifier [id number] - WC PHYS LEVEL 3 - EST PT ICD-10 Diagnosis Description W54.0XXA Bitten by dog, initial encounter L03.116 Cellulitis of left lower limb S80.12XA Contusion of left lower leg, initial encounter T79.2XXA  Traumatic secondary and recurrent hemorrhage and seroma, initial encounter Quantity: 1 Electronic Signature(s) Signed: 12/06/2022 4:32:17 PM By: [name] PA-C Entered By: [name] on 12/06/2022 16:32:17

## 2022-12-11 NOTE — Progress Notes (Signed)
[name] ([id number]) [id number] Page 1 of 8 Visit Report for 12/06/2022 Arrival Information Details Patient Name: Date of Service: [name]. 12/06/2022 3:30 PM Medical Record Number: [id number] Patient Account Number: [id number] Date of Birth/Sex: Treating RN: Sep 12, 1956 (67 y.o. [name], [name] Primary Care [name]: Jon Billings Other Clinician: Massie Kluver Referring [name]: Treating [name]/Extender: Venia Minks in Treatment: 14 Visit Information History Since Last Visit All ordered tests and consults were completed: No Patient Arrived: Ambulatory Added or deleted any medications: No Arrival Time: 15:35 Any new allergies or adverse reactions: No Transfer Assistance: None Had a fall or experienced change in No Patient Requires Transmission-Based Precautions: No activities of daily living that may affect Patient Has Alerts: Yes risk of falls: Patient Alerts: NOT Diabetic Signs or symptoms of abuse/neglect since last visito No Hospitalized since last visit: No Implantable device outside of the clinic excluding No cellular tissue based products placed in the center since last visit: Has Dressing in Place as Prescribed: Yes Pain Present Now: No Electronic Signature(s) Signed: 12/10/2022 4:23:47 PM By: Massie Kluver Entered By: Massie Kluver on 12/06/2022 15:41:15 -------------------------------------------------------------------------------- Clinic Level of Care Assessment Details Patient Name: Date of Service: [name] 12/06/2022 3:30 PM Medical Record Number: [id number] Patient Account Number: [id number] Date of Birth/Sex: Treating RN: [birth year] (67 y.o. [name] Primary Care Tramel Westbrook: Jon Billings Other Clinician: Massie Kluver Referring Amanda Steuart: Treating [name]/Extender: Venia Minks in Treatment: 14 Clinic Level of Care Assessment  Items TOOL 4 Quantity Score []$  - 0 Use when only an EandM is performed on FOLLOW-UP visit ASSESSMENTS - Nursing Assessment / Reassessment X- 1 10 Reassessment of Co-morbidities (includes updates in patient status) X- 1 5 Reassessment of Adherence to Treatment Plan [name], [name] ([id number]) [id number] Page 2 of 8 ASSESSMENTS - Wound and Skin A ssessment / Reassessment X - Simple Wound Assessment / Reassessment - one wound 1 5 []$  - 0 Complex Wound Assessment / Reassessment - multiple wounds []$  - 0 Dermatologic / Skin Assessment (not related to wound area) ASSESSMENTS - Focused Assessment []$  - 0 Circumferential Edema Measurements - multi extremities []$  - 0 Nutritional Assessment / Counseling / Intervention []$  - 0 Lower Extremity Assessment (monofilament, tuning fork, pulses) []$  - 0 Peripheral Arterial Disease Assessment (using hand held doppler) ASSESSMENTS - Ostomy and/or Continence Assessment and Care []$  - 0 Incontinence Assessment and Management []$  - 0 Ostomy Care Assessment and Management (repouching, etc.) PROCESS - Coordination of Care X - Simple Patient / Family Education for ongoing care 1 15 []$  - 0 Complex (extensive) Patient / Family Education for ongoing care []$  - 0 Staff obtains Programmer, systems, Records, T Results / Process Orders est []$  - 0 Staff telephones HHA, Nursing Homes / Clarify orders / etc []$  - 0 Routine Transfer to another Facility (non-emergent condition) []$  - 0 Routine Hospital Admission (non-emergent condition) []$  - 0 New Admissions / Biomedical engineer / Ordering NPWT Apligraf, etc. , []$  - 0 Emergency Hospital Admission (emergent condition) X- 1 10 Simple Discharge Coordination []$  - 0 Complex (extensive) Discharge Coordination PROCESS - Special Needs []$  - 0 Pediatric / Minor Patient Management []$  - 0 Isolation Patient Management []$  - 0 Hearing / Language / Visual special needs []$  - 0 Assessment of  Community assistance (transportation, D/C planning, etc.) []$  - 0 Additional assistance / Altered mentation []$  - 0 Support Surface(s) Assessment (bed, cushion, seat, etc.) INTERVENTIONS - Wound Cleansing / Measurement X -  Simple Wound Cleansing - one wound 1 5 []$  - 0 Complex Wound Cleansing - multiple wounds []$  - 0 Wound Imaging (photographs - any number of wounds) []$  - 0 Wound Tracing (instead of photographs) []$  - 0 Simple Wound Measurement - one wound []$  - 0 Complex Wound Measurement - multiple wounds INTERVENTIONS - Wound Dressings X - Small Wound Dressing one or multiple wounds 1 10 []$  - 0 Medium Wound Dressing one or multiple wounds []$  - 0 Large Wound Dressing one or multiple wounds []$  - 0 Application of Medications - topical []$  - 0 Application of Medications - injection INTERVENTIONS - Miscellaneous []$  - 0 External ear exam [name], [name] ([id number]) [phone].pdf Page 3 of 8 []$  - 0 Specimen Collection (cultures, biopsies, blood, body fluids, etc.) []$  - 0 Specimen(s) / Culture(s) sent or taken to Lab for analysis []$  - 0 Patient Transfer (multiple staff / Harrel Lemon Lift / Similar devices) []$  - 0 Simple Staple / Suture removal (25 or less) []$  - 0 Complex Staple / Suture removal (26 or more) []$  - 0 Hypo / Hyperglycemic Management (close monitor of Blood Glucose) []$  - 0 Ankle / Brachial Index (ABI) - do not check if billed separately X- 1 5 Vital Signs Has the patient been seen at the hospital within the last three years: Yes Total Score: 65 Level Of Care: New/Established - Level 2 Electronic Signature(s) Signed: 12/10/2022 4:23:47 PM By: Massie Kluver Entered By: Massie Kluver on 12/06/2022 16:09:41 -------------------------------------------------------------------------------- Encounter Discharge Information Details Patient Name: Date of Service: [name]. 12/06/2022 3:30 PM Medical Record Number: [id number] Patient  Account Number: [id number] Date of Birth/Sex: Treating RN: 11/01/1955 (67 y.o. [name] Primary Care Zamiah Tollett: Jon Billings Other Clinician: Massie Kluver Referring [name]: Treating Kaniyah Lisby/Extender: Venia Minks in Treatment: 14 Encounter Discharge Information Items Discharge Condition: Stable Ambulatory Status: Ambulatory Discharge Destination: Home Transportation: Private Auto Accompanied By: self Schedule Follow-up Appointment: Yes Clinical Summary of Care: Electronic Signature(s) Signed: 12/10/2022 4:23:47 PM By: Massie Kluver Entered By: Massie Kluver on 12/06/2022 16:17:06 Lower Extremity Assessment Details -------------------------------------------------------------------------------- [name] ([id number]) [id number] Page 4 of 8 Patient Name: Date of Service: [name] 12/06/2022 3:30 PM Medical Record Number: [id number] Patient Account Number: [id number] Date of Birth/Sex: Treating RN: [birth year] (67 y.o. [name] Primary Care Anastazja Isaac: Jon Billings Other Clinician: Massie Kluver Referring Random Dobrowski: Treating Ayako Tapanes/Extender: Venia Minks in Treatment: 14 Electronic Signature(s) Signed: 12/07/2022 1:08:07 PM By: Gretta Cool BSN, RN, CWS, [name] RN, BSN Signed: 12/10/2022 4:23:47 PM By: Massie Kluver Entered By: Massie Kluver on 12/06/2022 15:48:17 -------------------------------------------------------------------------------- Multi Wound Chart Details Patient Name: Date of Service: [name]. 12/06/2022 3:30 PM Medical Record Number: [id number] Patient Account Number: [id number] Date of Birth/Sex: Treating RN: 07-27-1956 (67 y.o. [name] Primary Care Cydne Grahn: Jon Billings Other Clinician: Massie Kluver Referring Kathrina Crosley: Treating Corin Formisano/Extender: Venia Minks in Treatment: 14 Vital Signs Height(in):  14 Pulse(bpm): 26 Weight(lbs): 180 Blood Pressure(mmHg): 121/72 Body Mass Index(BMI): 29 Temperature(F): 98.3 Respiratory Rate(breaths/min): 16 [2:Photos:] [N/A:N/A] Left, Distal Upper Leg N/A N/A Wound Location: Trauma N/A N/A Wounding Event: Trauma, Other N/A N/A Primary Etiology: Anemia, Hypertension, Lupus N/A N/A Comorbid History: Erythematosus, Osteoarthritis, Received Radiation 08/11/2022 N/A N/A Date Acquired: 14 N/A N/A Weeks of Treatment: Open N/A N/A Wound Status: No N/A N/A Wound Recurrence: 0.1x0.1x0.1 N/A N/A Measurements L x W x D (cm) 0.008 N/A N/A A (cm) : rea  0.001 N/A N/A Volume (cm) : 100.00% N/A N/A % Reduction in Area: 100.00% N/A N/A % Reduction in Volume: Full Thickness With Exposed Support N/A N/A Classification: Structures Medium N/A N/A Exudate Amount: Sanguinous N/A N/A Exudate Type: red N/A N/A Exudate Color: Large (67-100%) N/A N/A Granulation Amount: Red N/A N/A Granulation Quality: None Present (0%) N/A N/A Necrotic Amount: Fat Layer (Subcutaneous Tissue): Yes N/A N/A Exposed StructuresCYANNE, [name] ([id number]) [phone].pdf Page 5 of 8 Medium (34-66%) N/A N/A Epithelialization: Treatment Notes Electronic Signature(s) Signed: 12/10/2022 4:23:47 PM By: Massie Kluver Entered By: Massie Kluver on 12/06/2022 15:48:29 -------------------------------------------------------------------------------- Multi-Disciplinary Care Plan Details Patient Name: Date of Service: [name]. 12/06/2022 3:30 PM Medical Record Number: [id number] Patient Account Number: [id number] Date of Birth/Sex: Treating RN: 07-09-1956 (67 y.o. [name] Primary Care Timoty Bourke: Jon Billings Other Clinician: Massie Kluver Referring Johnica Armwood: Treating Corgan Mormile/Extender: Venia Minks in Treatment: 14 Active Inactive Electronic Signature(s) Signed: 12/07/2022 1:08:07 PM By:  Gretta Cool BSN, RN, CWS, [name] RN, BSN Signed: 12/10/2022 4:23:47 PM By: Massie Kluver Entered By: Massie Kluver on 12/06/2022 16:16:31 -------------------------------------------------------------------------------- Pain Assessment Details Patient Name: Date of Service: [name]. 12/06/2022 3:30 PM Medical Record Number: [id number] Patient Account Number: [id number] Date of Birth/Sex: Treating RN: [birth year] (67 y.o. [name] Primary Care Oakley Kossman: Jon Billings Other Clinician: Massie Kluver Referring Althia Egolf: Treating Gable Odonohue/Extender: Venia Minks in Treatment: 14 Active Problems Location of Pain Severity and Description of Pain Patient Has Paino No Site Locations [address], Solana [name] ([id number]) [phone].pdf Page 6 of 8 Pain Management and Medication Current Pain Management: Electronic Signature(s) Signed: 12/07/2022 1:08:07 PM By: Gretta Cool, BSN, RN, CWS, [name] RN, BSN Signed: 12/10/2022 4:23:47 PM By: Massie Kluver Entered By: Massie Kluver on 12/06/2022 15:44:22 -------------------------------------------------------------------------------- Patient/Caregiver Education Details Patient Name: Date of Service: [name]. 2/15/2024andnbsp3:30 PM Medical Record Number: [id number] Patient Account Number: [id number] Date of Birth/Gender: Treating RN: 10-Dec-1955 (67 y.o. [name] Primary Care Physician: Jon Billings Other Clinician: Massie Kluver Referring Physician: Treating Physician/Extender: Venia Minks in Treatment: 14 Education Assessment Education Provided To: Patient Education Topics Provided Wound/Skin Impairment: Handouts: Other: continue wound care as directed Methods: Explain/Verbal Responses: State content correctly Electronic Signature(s) Signed: 12/10/2022 4:23:47 PM By: Massie Kluver Entered By: Massie Kluver on 12/06/2022 16:16:16 [name] ([id number]) [id number] Page 7 of 8 -------------------------------------------------------------------------------- Wound Assessment Details Patient Name: Date of Service: [name]. 12/06/2022 3:30 PM Medical Record Number: [id number] Patient Account Number: [id number] Date of Birth/Sex: Treating RN: 23-Apr-1956 (67 y.o. [name], [name] Primary Care Costella Schwarz: Jon Billings Other Clinician: Massie Kluver Referring Cary Lothrop: Treating Luismanuel Corman/Extender: Venia Minks in Treatment: 14 Wound Status Wound Number: 2 Primary Trauma, Other Etiology: Wound Location: Left, Distal Upper Leg Wound Healed - Epithelialized Wounding Event: Trauma Status: Date Acquired: 08/11/2022 Comorbid Anemia, Hypertension, Lupus Erythematosus, Osteoarthritis, Weeks Of Treatment: 14 History: Received Radiation Clustered Wound: No Photos Wound Measurements Length: (cm) Width: (cm) Depth: (cm) Area: (cm) Volume: (cm) 0 % Reduction in Area: 100% 0 % Reduction in Volume: 100% 0 Epithelialization: Large (67-100%) 0 0 Wound Description Classification: Full Thickness With Exposed Suppor Exudate Amount: None Present t Structures Foul Odor After Cleansing: No Slough/Fibrino No Wound Bed Granulation Amount: None Present (0%) Exposed Structure Necrotic Amount: None Present (0%) Fat Layer (Subcutaneous Tissue) Exposed: Yes Treatment Notes Wound #2 (Upper Leg) Wound Laterality: Left, Distal Cleanser Peri-Wound Care Topical  Primary Dressing Secondary Dressing Secured With Compression [name], [name] ([id number]) [id number] Page 8 of 8 Compression Stockings Add-Ons Electronic Signature(s) Signed: 12/07/2022 1:08:07 PM By: Gretta Cool, BSN, RN, CWS, [name] RN, BSN Signed: 12/10/2022 4:23:47 PM By: Massie Kluver Entered By: Massie Kluver on 12/06/2022  16:05:17 -------------------------------------------------------------------------------- Vitals Details Patient Name: Date of Service: [name]. 12/06/2022 3:30 PM Medical Record Number: [id number] Patient Account Number: [id number] Date of Birth/Sex: Treating RN: Apr 04, 1956 (67 y.o. [name], [name] Primary Care Jontavious Commons: Jon Billings Other Clinician: Massie Kluver Referring Eluzer Howdeshell: Treating Katalaya Beel/Extender: Venia Minks in Treatment: 14 Vital Signs Time Taken: 15:41 Temperature (F): 98.3 Height (in): 66 Pulse (bpm): 76 Weight (lbs): 180 Respiratory Rate (breaths/min): 16 Body Mass Index (BMI): 29 Blood Pressure (mmHg): 121/72 Reference Range: 80 - 120 mg / dl Electronic Signature(s) Signed: 12/10/2022 4:23:47 PM By: Massie Kluver Entered By: Massie Kluver on 12/06/2022 15:44:17

## 2023-01-02 ENCOUNTER — Ambulatory Visit (INDEPENDENT_AMBULATORY_CARE_PROVIDER_SITE_OTHER): Payer: Medicare Other

## 2023-01-02 DIAGNOSIS — D519 Vitamin B12 deficiency anemia, unspecified: Secondary | ICD-10-CM | POA: Diagnosis not present

## 2023-01-14 ENCOUNTER — Telehealth: Payer: Self-pay | Admitting: Nurse Practitioner

## 2023-01-14 NOTE — Telephone Encounter (Signed)
[facility] to schedule their annual wellness visit. Appointment made for 02/05/2023.  Sherol Dade; Care Guide Ambulatory Clinical Support [facility] Direct Dial: [phone]

## 2023-02-03 ENCOUNTER — Other Ambulatory Visit: Payer: Self-pay | Admitting: Nurse Practitioner

## 2023-02-05 NOTE — Telephone Encounter (Signed)
Requested Prescriptions  Pending Prescriptions Disp Refills   traZODone (DESYREL) 50 MG tablet [Pharmacy Med Name: TRAZODONE 50 MG TABLET] 90 tablet 0    Sig: TAKE 1 TABLET BY MOUTH AT BEDTIME AS NEEDED FOR SLEEP     Psychiatry: Antidepressants - Serotonin Modulator Passed - 02/03/2023 12:12 PM      Passed - Completed PHQ-2 or PHQ-9 in the last 360 days      Passed - Valid encounter within last 6 months    Recent Outpatient Visits           2 months ago Stage 3a chronic kidney disease (CKD) (HCC)   [facility] [place], Clydie Braun, NP   3 months ago Anemia due to vitamin B12 deficiency, unspecified B12 deficiency type   [facility] Mecum, Erin E, PA-C   5 months ago Anemia due to vitamin B12 deficiency, unspecified B12 deficiency type   [facility] Larae Grooms, NP   5 months ago Dog bite of lower leg, unspecified laterality, initial encounter   [place] [facility] Larae Grooms, NP   5 months ago Annual physical exam   [facility] Larae Grooms, NP       Future Appointments             In 1 month Larae Grooms, NP [place] [facility]

## 2023-02-19 ENCOUNTER — Ambulatory Visit (INDEPENDENT_AMBULATORY_CARE_PROVIDER_SITE_OTHER): Payer: Medicare Other

## 2023-02-19 VITALS — BP 152/78 | Ht 66.0 in | Wt 192.2 lb

## 2023-02-19 DIAGNOSIS — Z0000 Encounter for general adult medical examination without abnormal findings: Secondary | ICD-10-CM

## 2023-02-19 NOTE — Patient Instructions (Signed)
[name] , Thank you for taking time to come for your Medicare Wellness Visit. I appreciate your ongoing commitment to your health goals. Please review the following plan we discussed and let me know if I can assist you in the future.   These are the goals we discussed:  Goals      DIET - EAT MORE FRUITS AND VEGETABLES     Patient Stated     Patient would like to start eating healthier.   Would like to start going to gym work on toning        This is a list of the screening recommended for you and due dates:  Health Maintenance  Topic Date Due   Zoster (Shingles) Vaccine (1 of 2) Never done   Pneumonia Vaccine (1 of 1 - PCV) Never done   COVID-19 Vaccine (3 - Pfizer risk series) 03/09/2020   Flu Shot  05/23/2023   Mammogram  07/12/2023   Medicare Annual Wellness Visit  02/19/2024   DTaP/Tdap/Td vaccine (2 - Td or Tdap) 03/27/2028   Colon Cancer Screening  12/19/2031   DEXA scan (bone density measurement)  Completed   Hepatitis C Screening: USPSTF Recommendation to screen - Ages 79-79 yo.  Completed   HPV Vaccine  Aged Out    Advanced directives: no  Conditions/risks identified: none  Next appointment: Follow up in one year for your annual wellness visit 02/25/24 @ 11:30 am in person   Preventive Care 65 Years and Older, Female Preventive care refers to lifestyle choices and visits with your health care provider that can promote health and wellness. What does preventive care include? A yearly physical exam. This is also called an annual well check. Dental exams once or twice a year. Routine eye exams. Ask your health care provider how often you should have your eyes checked. Personal lifestyle choices, including: Daily care of your teeth and gums. Regular physical activity. Eating a healthy diet. Avoiding tobacco and drug use. Limiting alcohol use. Practicing safe sex. Taking low-dose aspirin every day. Taking vitamin and mineral supplements as recommended by your  health care provider. What happens during an annual well check? The services and screenings done by your health care provider during your annual well check will depend on your age, overall health, lifestyle risk factors, and family history of disease. Counseling  Your health care provider may ask you questions about your: Alcohol use. Tobacco use. Drug use. Emotional well-being. Home and relationship well-being. Sexual activity. Eating habits. History of falls. Memory and ability to understand (cognition). Work and work Astronomer. Reproductive health. Screening  You may have the following tests or measurements: Height, weight, and BMI. Blood pressure. Lipid and cholesterol levels. These may be checked every 5 years, or more frequently if you are over 70 years old. Skin check. Lung cancer screening. You may have this screening every year starting at age 73 if you have a 30-pack-year history of smoking and currently smoke or have quit within the past 15 years. Fecal occult blood test (FOBT) of the stool. You may have this test every year starting at age 52. Flexible sigmoidoscopy or colonoscopy. You may have a sigmoidoscopy every 5 years or a colonoscopy every 10 years starting at age 51. Hepatitis C blood test. Hepatitis B blood test. Sexually transmitted disease (STD) testing. Diabetes screening. This is done by checking your blood sugar (glucose) after you have not eaten for a while (fasting). You may have this done every 1-3 years. Bone density scan.  This is done to screen for osteoporosis. You may have this done starting at age 52. Mammogram. This may be done every 1-2 years. Talk to your health care provider about how often you should have regular mammograms. Talk with your health care provider about your test results, treatment options, and if necessary, the need for more tests. Vaccines  Your health care provider may recommend certain vaccines, such as: Influenza vaccine.  This is recommended every year. Tetanus, diphtheria, and acellular pertussis (Tdap, Td) vaccine. You may need a Td booster every 10 years. Zoster vaccine. You may need this after age 33. Pneumococcal 13-valent conjugate (PCV13) vaccine. One dose is recommended after age 12. Pneumococcal polysaccharide (PPSV23) vaccine. One dose is recommended after age 21. Talk to your health care provider about which screenings and vaccines you need and how often you need them. This information is not intended to replace advice given to you by your health care provider. Make sure you discuss any questions you have with your health care provider. Document Released: 11/04/2015 Document Revised: 06/27/2016 Document Reviewed: 08/09/2015 Elsevier Interactive Patient Education  2017 [place] Prevention in the Home Falls can cause injuries. They can happen to people of all ages. There are many things you can do to make your home safe and to help prevent falls. What can I do on the outside of my home? Regularly fix the edges of walkways and driveways and fix any cracks. Remove anything that might make you trip as you walk through a door, such as a raised step or threshold. Trim any bushes or trees on the path to your home. Use bright outdoor lighting. Clear any walking paths of anything that might make someone trip, such as rocks or tools. Regularly check to see if handrails are loose or broken. Make sure that both sides of any steps have handrails. Any raised decks and porches should have guardrails on the edges. Have any leaves, snow, or ice cleared regularly. Use sand or salt on walking paths during winter. Clean up any spills in your garage right away. This includes oil or grease spills. What can I do in the bathroom? Use night lights. Install grab bars by the toilet and in the tub and shower. Do not use towel bars as grab bars. Use non-skid mats or decals in the tub or shower. If you need to sit  down in the shower, use a plastic, non-slip stool. Keep the floor dry. Clean up any water that spills on the floor as soon as it happens. Remove soap buildup in the tub or shower regularly. Attach bath mats securely with double-sided non-slip rug tape. Do not have throw rugs and other things on the floor that can make you trip. What can I do in the bedroom? Use night lights. Make sure that you have a light by your bed that is easy to reach. Do not use any sheets or blankets that are too big for your bed. They should not hang down onto the floor. Have a firm chair that has side arms. You can use this for support while you get dressed. Do not have throw rugs and other things on the floor that can make you trip. What can I do in the kitchen? Clean up any spills right away. Avoid walking on wet floors. Keep items that you use a lot in easy-to-reach places. If you need to reach something above you, use a strong step stool that has a grab bar. Keep electrical cords  out of the way. Do not use floor polish or wax that makes floors slippery. If you must use wax, use non-skid floor wax. Do not have throw rugs and other things on the floor that can make you trip. What can I do with my stairs? Do not leave any items on the stairs. Make sure that there are handrails on both sides of the stairs and use them. Fix handrails that are broken or loose. Make sure that handrails are as long as the stairways. Check any carpeting to make sure that it is firmly attached to the stairs. Fix any carpet that is loose or worn. Avoid having throw rugs at the top or bottom of the stairs. If you do have throw rugs, attach them to the floor with carpet tape. Make sure that you have a light switch at the top of the stairs and the bottom of the stairs. If you do not have them, ask someone to add them for you. What else can I do to help prevent falls? Wear shoes that: Do not have high heels. Have rubber bottoms. Are  comfortable and fit you well. Are closed at the toe. Do not wear sandals. If you use a stepladder: Make sure that it is fully opened. Do not climb a closed stepladder. Make sure that both sides of the stepladder are locked into place. Ask someone to hold it for you, if possible. Clearly mark and make sure that you can see: Any grab bars or handrails. First and last steps. Where the edge of each step is. Use tools that help you move around (mobility aids) if they are needed. These include: Canes. Walkers. Scooters. Crutches. Turn on the lights when you go into a dark area. Replace any light bulbs as soon as they burn out. Set up your furniture so you have a clear path. Avoid moving your furniture around. If any of your floors are uneven, fix them. If there are any pets around you, be aware of where they are. Review your medicines with your doctor. Some medicines can make you feel dizzy. This can increase your chance of falling. Ask your doctor what other things that you can do to help prevent falls. This information is not intended to replace advice given to you by your health care provider. Make sure you discuss any questions you have with your health care provider. Document Released: 08/04/2009 Document Revised: 03/15/2016 Document Reviewed: 11/12/2014 Elsevier Interactive Patient Education  2017 [employer].

## 2023-02-19 NOTE — Progress Notes (Signed)
Subjective:   [name] is a 67 y.o. female who presents for Medicare Annual (Subsequent) preventive examination.  Review of Systems     Cardiac Risk Factors include: advanced age (>47men, >82 women);hypertension     Objective:    Today's Vitals   02/19/23 1338  BP: (!) 152/78  Weight: 192 lb 3.2 oz (87.2 kg)  Height: 5\' 6"  (1.676 m)  PainSc: 4    Body mass index is 31.02 kg/m.     02/19/2023    2:05 PM 10/02/2022    3:04 PM 08/20/2022    2:42 PM 08/11/2022    3:49 PM 03/28/2022    9:59 AM 12/18/2021    9:20 AM 11/06/2021    8:32 AM  Advanced Directives  Does Patient Have a Medical Advance Directive? No No No No No No No  Would patient like information on creating a medical advance directive? No - Patient declined No - Patient declined No - Patient declined No - Patient declined No - Patient declined  No - Patient declined    Current Medications (verified) Outpatient Encounter Medications as of 02/19/2023  Medication Sig   aspirin EC 81 MG tablet Take 81 mg by mouth daily. Swallow whole.   chlorthalidone (HYGROTON) 25 MG tablet Take 12.5 mg by mouth in the morning.   Cholecalciferol (VITAMIN D3 PO) Take 1,000 Units by mouth daily.   clonazePAM (KLONOPIN) 0.5 MG tablet Take 1 tablet (0.5 mg total) by mouth daily as needed for anxiety.   cyanocobalamin 1000 MCG tablet Take 1,000 mcg by mouth daily. Vitamin B12   enalapril (VASOTEC) 20 MG tablet Take 0.5 tablets (10 mg total) by mouth 2 (two) times daily.   Ferrous Sulfate (IRON) 325 (65 Fe) MG TABS Take 1 tablet by mouth daily.   fluticasone (FLONASE) 50 MCG/ACT nasal spray Place 2 sprays into both nostrils daily.   hydroxychloroquine (PLAQUENIL) 200 MG tablet Take 200 mg by mouth every other day.   lipase/protease/amylase (CREON) 36000 UNITS CPEP capsule Take 2 capsules with the first bite of each meal and 1 capsule with the first bite of each snack   meclizine (ANTIVERT) 12.5 MG tablet Take 1 tablet (12.5 mg  total) by mouth 3 (three) times daily as needed for dizziness.   traZODone (DESYREL) 50 MG tablet TAKE 1 TABLET BY MOUTH AT BEDTIME AS NEEDED FOR SLEEP   Facility-Administered Encounter Medications as of 02/19/2023  Medication   cyanocobalamin (VITAMIN B12) injection 1,000 mcg    Allergies (verified) Elemental sulfur, Sulfa antibiotics, Beef-derived products, Lambs quarters, and Milk-related compounds   History: Past Medical History:  Diagnosis Date   Abnormal pap    Anemia    Anxiety    History of lupus 1987   Hypertension    Low vitamin D level    Renal insufficiency 1988   kidney biopsy 1988 and 1998- "my kidneys function at like 48%"   Past Surgical History:  Procedure Laterality Date   BONE MARROW BIOPSY  01/2021   BREAST BIOPSY Right 07/08/2017   BREAST EXCISIONAL BIOPSY Right 08/06/2017   BREAST EXCISIONAL BIOPSY Right 07/1999   BREAST LUMPECTOMY WITH RADIOACTIVE SEED LOCALIZATION Right 08/06/2017   Procedure: RIGHT BREAST LUMPECTOMY WITH RADIOACTIVE SEED LOCALIZATION ERAS PATHWAY;  Surgeon: Glenna Fellows, MD;  Location: [facility];  Service: General;  Laterality: Right;   BREAST LUMPECTOMY WITH RADIOACTIVE SEED LOCALIZATION Right 08/19/2018   Procedure: RIGHT BREAST LUMPECTOMY WITH RADIOACTIVE SEED LOCALIZATION ERAS PATHWAY;  Surgeon: Glenna Fellows, MD;  Location: [facility] OR;  Service: General;  Laterality: Right;   BREAST SURGERY  07/1999   right breast benign fibroadenoma   COLONOSCOPY  2012   COLONOSCOPY WITH PROPOFOL N/A 05/01/2017   Procedure: COLONOSCOPY WITH PROPOFOL;  Surgeon: Kieth Brightly, MD;  Location: [facility] ENDOSCOPY;  Service: Endoscopy;  Laterality: N/A;   COLONOSCOPY WITH PROPOFOL N/A 12/18/2021   Procedure: COLONOSCOPY WITH PROPOFOL;  Surgeon: Toney Reil, MD;  Location[facility] END[facility]COPY;  Service: Gastroenterology;  Laterality: N/A;   CRYOTHERAPY     abnormal pap smear   ESOPHAGOGASTRODUODENOSCOPY (EGD) WITH PROPOFOL  N/A 05/01/2017   Procedure: ESOPHAGOGASTRODUODENOSCOPY (EGD) WITH PROPOFOL;  Surgeon: Kieth Brightly, MD;  Location: [facility] ENDOSCOPY;  Service: Endoscopy;  Laterality: N/A;   ESOPHAGOGASTRODUODENOSCOPY (EGD) WITH PROPOFOL N/A 12/18/2021   Procedure: ESOPHAGOGASTRODUODENOSCOPY (EGD) WITH PROPOFOL;  Surgeon: Toney Reil, MD;  Location[facility] ENDOSCOPY;  Service: Gastroenterology;  Laterality: N/A;   NASAL SEPTUM SURGERY  2007   TUBAL LIGATION  1990   Family History  Problem Relation Age of Onset   Cancer Father        oral cancer   Cancer Maternal Grandmother        ovarian cancer   Hypertension Maternal Grandmother    Diabetes Maternal Grandfather    Diabetes Paternal Aunt    Breast cancer Neg Hx    Social History   Socioeconomic History   Marital status: Widowed    Spouse name: Not on file   Number of children: Not on file   Years of education: Not on file   Highest education level: Not on file  Occupational History   Not on file  Tobacco Use   Smoking status: Never   Smokeless tobacco: Never  Vaping Use   Vaping Use: Never used  Substance and Sexual Activity   Alcohol use: Yes    Alcohol/week: 4.0 - 6.0 standard drinks of alcohol    Types: 4 - 6 Standard drinks or equivalent per week    Comment: few beers on friday and saturday   Drug use: Yes    Types: Marijuana    Comment: few times a month    Sexual activity: Yes    Partners: Male    Birth control/protection: Surgical, Post-menopausal    Comment: BTL  Other Topics Concern   Not on file  Social History Narrative   Husband died 12-05-16 from lung cancer at age 66 yrs old.     Social Determinants of Health   Financial Resource Strain: Low Risk  (02/19/2023)   Overall Financial Resource Strain (CARDIA)    Difficulty of Paying Living Expenses: Not hard at all  Food Insecurity: No Food Insecurity (02/19/2023)   Hunger Vital Sign    Worried About Running Out of Food in the Last Year: Never true     Ran Out of Food in the Last Year: Never true  Transportation Needs: No Transportation Needs (02/19/2023)   PRAPARE - Administrator, Civil Service (Medical): No    Lack of Transportation (Non-Medical): No  Physical Activity: Insufficiently Active (02/19/2023)   Exercise Vital Sign    Days of Exercise per Week: 5 days    Minutes of Exercise per Session: 20 min  Stress: No Stress Concern Present (02/19/2023)   [employer] of Occupational Health - Occupational Stress Questionnaire    Feeling of Stress : Only a little  Social Connections: Moderately Integrated (02/19/2023)   Social Connection and Isolation Panel [NHANES]    Frequency  of Communication with Friends and Family: More than three times a week    Frequency of Social Gatherings with Friends and Family: Once a week    Attends Religious Services: More than 4 times per year    Active Member of [employer] or Organizations: Yes    Attends Banker Meetings: More than 4 times per year    Marital Status: Widowed    Tobacco Counseling Counseling given: Not Answered   Clinical Intake:  Pre-visit preparation completed: Yes  Pain : 0-10 Pain Score: 4  Pain Type: Chronic pain Pain Location: Leg Pain Orientation: Left     Nutritional Risks: None Diabetes: No  How often do you need to have someone help you when you read instructions, pamphlets, or other written materials from your doctor or pharmacy?: 1 - Never  Diabetic?no  Interpreter Needed?: No  Information entered by :: Kennedy Bucker, LPN   Activities of Daily Living    02/19/2023    2:07 PM 08/12/2022    6:00 AM  In your present state of health, do you have any difficulty performing the following activities:  Hearing? 0 0  Vision? 0 0  Difficulty concentrating or making decisions? 0 0  Walking or climbing stairs? 0 0  Dressing or bathing? 0 0  Doing errands, shopping? 0 1  Preparing Food and eating ? N   Using the Toilet? N   In the past  six months, have you accidently leaked urine? N   Do you have problems with loss of bowel control? N   Managing your Medications? N   Managing your Finances? N   Housekeeping or managing your Housekeeping? N     Patient Care Team: Larae Grooms, NP as PCP - General (Nurse Practitioner) Ria Comment, FNP as Nurse Practitioner (Family Medicine) Kieth Brightly, MD (General Surgery) Kandyce Rud., MD (Rheumatology) Patton Salles, MD as Consulting Physician (Obstetrics and Gynecology) Earna Coder, MD as Consulting Physician (Hematology and Oncology)  Indicate any recent Medical Services you may have received from other than [facility] providers in the past year (date may be approximate).     Assessment:   This is a routine wellness examination for [place].  Hearing/Vision screen Hearing Screening - Comments:: No aids Vision Screening - Comments:: Wears glasses- Dr.Nice  Dietary issues and exercise activities discussed: Current Exercise Habits: Home exercise routine, Type of exercise: walking, Time (Minutes): 30, Frequency (Times/Week): 5, Weekly Exercise (Minutes/Week): 150, Intensity: Mild   Goals Addressed             This Visit's Progress    DIET - EAT MORE FRUITS AND VEGETABLES         Depression Screen    02/19/2023    1:59 PM 10/11/2022    3:51 PM 08/15/2022    3:27 PM 07/31/2022    3:39 PM 03/01/2022    1:59 PM 01/18/2022    2:56 PM 11/06/2021    8:38 AM  PHQ 2/9 Scores  PHQ - 2 Score 2  3 2 2 2 1   PHQ- 9 Score 4  11 7 9 10    Exception Documentation  Patient refusal         Fall Risk    02/19/2023    2:05 PM 10/11/2022    3:51 PM 08/15/2022    3:26 PM 07/31/2022    3:38 PM 03/01/2022    1:59 PM  Fall Risk   Falls in the past year? 1 0 1  1 1  Number falls in past yr: 0 0 1 1 1   Injury with Fall? 1 0 1 1 1   Risk for fall due to : History of fall(s) No Fall Risks History of fall(s) History of fall(s) History of  fall(s)  Follow up Falls evaluation completed;Falls prevention discussed Falls evaluation completed Falls evaluation completed Falls evaluation completed Falls evaluation completed    FALL RISK PREVENTION PERTAINING TO THE HOME:  Any stairs in or around the home? Yes  If so, are there any without handrails? No  Home free of loose throw rugs in walkways, pet beds, electrical cords, etc? Yes  Adequate lighting in your home to reduce risk of falls? Yes   ASSISTIVE DEVICES UTILIZED TO PREVENT FALLS:  Life alert? No  Use of a cane, walker or w/c? No  Grab bars in the bathroom? No  Shower chair or bench in shower? No  Elevated toilet seat or a handicapped toilet? No    Cognitive Function:        02/19/2023    2:16 PM  6CIT Screen  What Year? 0 points  What month? 0 points  What time? 0 points  Count back from 20 0 points  Months in reverse 0 points  Repeat phrase 0 points  Total Score 0 points    Immunizations Immunization History  Administered Date(s) Administered   Fluad Quad(high Dose 65+) 10/02/2021   Influenza,inj,Quad PF,6+ Mos 09/07/2016, 07/21/2019   Influenza-Unspecified 09/07/2016   PFIZER(Purple Top)SARS-COV-2 Vaccination 01/15/2020, 02/10/2020   Tdap 03/27/2018    TDAP status: Up to date  Flu Vaccine status: Declined, Education has been provided regarding the importance of this vaccine but patient still declined. Advised may receive this vaccine at local pharmacy or Health Dept. Aware to provide a copy of the vaccination record if obtained from local pharmacy or Health Dept. Verbalized acceptance and understanding.  Pneumococcal vaccine status: Declined,  Education has been provided regarding the importance of this vaccine but patient still declined. Advised may receive this vaccine at local pharmacy or Health Dept. Aware to provide a copy of the vaccination record if obtained from local pharmacy or Health Dept. Verbalized acceptance and understanding.    Covid-19 vaccine status: Completed vaccines  Qualifies for Shingles Vaccine? Yes   Zostavax completed No   Shingrix Completed?: No.    Education has been provided regarding the importance of this vaccine. Patient has been advised to call insurance company to determine out of pocket expense if they have not yet received this vaccine. Advised may also receive vaccine at local pharmacy or Health Dept. Verbalized acceptance and understanding.  Screening Tests Health Maintenance  Topic Date Due   Zoster Vaccines- Shingrix (1 of 2) Never done   Pneumonia Vaccine 9+ Years old (1 of 1 - PCV) Never done   COVID-19 Vaccine (3 - Pfizer risk series) 03/09/2020   INFLUENZA VACCINE  05/23/2023   MAMMOGRAM  07/12/2023   Medicare Annual Wellness (AWV)  02/19/2024   DTaP/Tdap/Td (2 - Td or Tdap) 03/27/2028   COLONOSCOPY (Pts 45-53yrs Insurance coverage will need to be confirmed)  12/19/2031   DEXA SCAN  Completed   Hepatitis C Screening  Completed   HPV VACCINES  Aged Out    Health Maintenance  Health Maintenance Due  Topic Date Due   Zoster Vaccines- Shingrix (1 of 2) Never done   Pneumonia Vaccine 47+ Years old (1 of 1 - PCV) Never done   COVID-19 Vaccine (3 - Pfizer risk series) 03/09/2020  Colorectal cancer screening: Type of screening: Colonoscopy. Completed 12/18/21. Repeat every 10 years  Mammogram status: Completed scheduled for 07/15/23. Repeat every year  Bone Density status: Completed 07/06/19. Results reflect: Bone density results: NORMAL. Repeat every 5 years.  Lung Cancer Screening: (Low Dose CT Chest recommended if Age 62-80 years, 30 pack-year currently smoking OR have quit w/in 15years.) does not qualify.   Additional Screening:  Hepatitis C Screening: does qualify; Completed 03/23/16  Vision Screening: Recommended annual ophthalmology exams for early detection of glaucoma and other disorders of the eye. Is the patient up to date with their annual eye exam?  Yes  Who is  the provider or what is the name of the office in which the patient attends annual eye exams? Dr.Nice If pt is not established with a provider, would they like to be referred to a provider to establish care? No .   Dental Screening: Recommended annual dental exams for proper oral hygiene  Community Resource Referral / Chronic Care Management: CRR required this visit?  No   CCM required this visit?  No      Plan:     I have personally reviewed and noted the following in the patient's chart:   Medical and social history Use of alcohol, tobacco or illicit drugs  Current medications and supplements including opioid prescriptions. Patient is not currently taking opioid prescriptions. Functional ability and status Nutritional status Physical activity Advanced directives List of other physicians Hospitalizations, surgeries, and ER visits in previous 12 months Vitals Screenings to include cognitive, depression, and falls Referrals and appointments  In addition, I have reviewed and discussed with patient certain preventive protocols, quality metrics, and best practice recommendations. A written personalized care plan for preventive services as well as general preventive health recommendations were provided to patient.     Hal Hope, LPN   1/61/0960   Nurse Notes: none

## 2023-03-04 DIAGNOSIS — M542 Cervicalgia: Secondary | ICD-10-CM | POA: Diagnosis not present

## 2023-03-04 DIAGNOSIS — M9901 Segmental and somatic dysfunction of cervical region: Secondary | ICD-10-CM | POA: Diagnosis not present

## 2023-03-04 DIAGNOSIS — M9902 Segmental and somatic dysfunction of thoracic region: Secondary | ICD-10-CM | POA: Diagnosis not present

## 2023-03-04 DIAGNOSIS — M40292 Other kyphosis, cervical region: Secondary | ICD-10-CM | POA: Diagnosis not present

## 2023-04-01 ENCOUNTER — Inpatient Hospital Stay: Payer: Medicare Other | Attending: Internal Medicine

## 2023-04-01 DIAGNOSIS — D72821 Monocytosis (symptomatic): Secondary | ICD-10-CM | POA: Diagnosis not present

## 2023-04-01 DIAGNOSIS — N183 Chronic kidney disease, stage 3 unspecified: Secondary | ICD-10-CM | POA: Insufficient documentation

## 2023-04-01 DIAGNOSIS — Z79899 Other long term (current) drug therapy: Secondary | ICD-10-CM | POA: Diagnosis not present

## 2023-04-01 DIAGNOSIS — I129 Hypertensive chronic kidney disease with stage 1 through stage 4 chronic kidney disease, or unspecified chronic kidney disease: Secondary | ICD-10-CM | POA: Insufficient documentation

## 2023-04-01 DIAGNOSIS — E538 Deficiency of other specified B group vitamins: Secondary | ICD-10-CM | POA: Diagnosis not present

## 2023-04-01 DIAGNOSIS — Z7982 Long term (current) use of aspirin: Secondary | ICD-10-CM | POA: Diagnosis not present

## 2023-04-01 DIAGNOSIS — D75839 Thrombocytosis, unspecified: Secondary | ICD-10-CM | POA: Diagnosis not present

## 2023-04-01 DIAGNOSIS — D649 Anemia, unspecified: Secondary | ICD-10-CM

## 2023-04-01 DIAGNOSIS — D638 Anemia in other chronic diseases classified elsewhere: Secondary | ICD-10-CM | POA: Insufficient documentation

## 2023-04-01 LAB — CBC WITH DIFFERENTIAL/PLATELET
Abs Immature Granulocytes: 0.02 10*3/uL (ref 0.00–0.07)
Basophils Absolute: 0.1 10*3/uL (ref 0.0–0.1)
Basophils Relative: 1 %
Eosinophils Absolute: 0.2 10*3/uL (ref 0.0–0.5)
Eosinophils Relative: 3 %
HCT: 31.8 % — ABNORMAL LOW (ref 36.0–46.0)
Hemoglobin: 10.8 g/dL — ABNORMAL LOW (ref 12.0–15.0)
Immature Granulocytes: 0 %
Lymphocytes Relative: 24 %
Lymphs Abs: 2.3 10*3/uL (ref 0.7–4.0)
MCH: 33 pg (ref 26.0–34.0)
MCHC: 34 g/dL (ref 30.0–36.0)
MCV: 97.2 fL (ref 80.0–100.0)
Monocytes Absolute: 1.5 10*3/uL — ABNORMAL HIGH (ref 0.1–1.0)
Monocytes Relative: 16 %
Neutro Abs: 5.3 10*3/uL (ref 1.7–7.7)
Neutrophils Relative %: 56 %
Platelets: 452 10*3/uL — ABNORMAL HIGH (ref 150–400)
RBC: 3.27 MIL/uL — ABNORMAL LOW (ref 3.87–5.11)
RDW: 13.8 % (ref 11.5–15.5)
WBC: 9.5 10*3/uL (ref 4.0–10.5)
nRBC: 0 % (ref 0.0–0.2)

## 2023-04-01 LAB — FERRITIN: Ferritin: 41 ng/mL (ref 11–307)

## 2023-04-01 LAB — BASIC METABOLIC PANEL
Anion gap: 5 (ref 5–15)
BUN: 33 mg/dL — ABNORMAL HIGH (ref 8–23)
CO2: 24 mmol/L (ref 22–32)
Calcium: 8.6 mg/dL — ABNORMAL LOW (ref 8.9–10.3)
Chloride: 108 mmol/L (ref 98–111)
Creatinine, Ser: 1.6 mg/dL — ABNORMAL HIGH (ref 0.44–1.00)
GFR, Estimated: 35 mL/min — ABNORMAL LOW (ref >60)
Glucose, Bld: 93 mg/dL (ref 70–99)
Potassium: 4.3 mmol/L (ref 3.5–5.1)
Sodium: 137 mmol/L (ref 135–145)

## 2023-04-01 LAB — IRON AND TIBC
Iron: 91 ug/dL (ref 28–170)
Saturation Ratios: 27 % (ref 10.4–31.8)
TIBC: 337 ug/dL (ref 250–450)
UIBC: 246 ug/dL

## 2023-04-01 LAB — LACTATE DEHYDROGENASE: LDH: 159 U/L (ref 98–192)

## 2023-04-01 LAB — VITAMIN B12: Vitamin B-12: 702 pg/mL (ref 180–914)

## 2023-04-02 ENCOUNTER — Ambulatory Visit (INDEPENDENT_AMBULATORY_CARE_PROVIDER_SITE_OTHER): Payer: Medicare Other | Admitting: Nurse Practitioner

## 2023-04-02 ENCOUNTER — Encounter: Payer: Self-pay | Admitting: Nurse Practitioner

## 2023-04-02 VITALS — BP 116/72 | HR 76 | Temp 96.7°F | Wt 188.4 lb

## 2023-04-02 DIAGNOSIS — F419 Anxiety disorder, unspecified: Secondary | ICD-10-CM

## 2023-04-02 DIAGNOSIS — I10 Essential (primary) hypertension: Secondary | ICD-10-CM

## 2023-04-02 DIAGNOSIS — E559 Vitamin D deficiency, unspecified: Secondary | ICD-10-CM

## 2023-04-02 DIAGNOSIS — D519 Vitamin B12 deficiency anemia, unspecified: Secondary | ICD-10-CM | POA: Diagnosis not present

## 2023-04-02 DIAGNOSIS — N1831 Chronic kidney disease, stage 3a: Secondary | ICD-10-CM

## 2023-04-02 MED ORDER — CLONAZEPAM 0.5 MG PO TABS: 0.5000 mg | ORAL_TABLET | Freq: Every day | ORAL | 0 refills | Status: DC | PRN

## 2023-04-02 MED FILL — Iron Sucrose Inj 20 MG/ML (Fe Equiv): INTRAVENOUS | Qty: 10 | Status: AC

## 2023-04-02 NOTE — Assessment & Plan Note (Addendum)
Labs reviewed from cancer center.

## 2023-04-02 NOTE — Assessment & Plan Note (Signed)
Chronic.  Controlled.  Continue with current medication regimen of enalapril and Chlorthalidone.  Medications are prescribed by [facility].  Labs from the cancer center reviewed during visit today.  Return to clinic in 6 months for reevaluation.  Call sooner if concerns arise.

## 2023-04-02 NOTE — Assessment & Plan Note (Signed)
Chronic, ongoing.  Followed by [facility] nephrology.  Recent note reviewed.  BMP reviewed from cancer center.

## 2023-04-02 NOTE — Progress Notes (Signed)
BP 116/72   Pulse 76   Temp (!) 96.7 F (35.9 C) (Oral)   Wt 188 lb 6.4 oz (85.5 kg)   LMP 08/22/2008   SpO2 98%   BMI 30.41 kg/m    Subjective:    Patient ID: [name], female    DOB: 1956-04-21, 67 y.o.   MRN: [id number]  HPI: [name] is a 67 y.o. female  Chief Complaint  Patient presents with   Hypertension   HYPERTENSION with Chronic Kidney Disease Hypertension status: controlled  Satisfied with current treatment? yes Duration of hypertension: years BP monitoring frequency:  daily BP range: 130/70 BP medication side effects:  no Medication compliance: excellent compliance Previous BP meds: enalapril and chlorthalidone Aspirin: no Recurrent headaches: no Visual changes: no Palpitations: no Dyspnea: no Chest pain: no Lower extremity edema: no Dizzy/lightheaded: no  ANEMIA Anemia status: controlled Etiology of anemia: B12 and CKD Duration of anemia treatment:  Compliance with treatment: excellent compliance Iron supplementation side effects: no Severity of anemia: mild Fatigue: yes Decreased exercise tolerance: no  Dyspnea on exertion: no Palpitations: no Bleeding: no Pica: no Had labs yesterday with cancer center.  Will follow up with them next week.   ANXIETY Patient states her anxiety has been good.  She thinks she has taken one of the Klonopin. She feels like she is doing well.  She does need a refill.    Relevant past medical, surgical, family and social history reviewed and updated as indicated. Interim medical history since our last visit reviewed. Allergies and medications reviewed and updated.  Review of Systems  Constitutional:  Negative for fatigue.  Eyes:  Negative for visual disturbance.  Respiratory:  Negative for cough, chest tightness and shortness of breath.   Cardiovascular:  Negative for chest pain, palpitations and leg swelling.  Neurological:  Negative for dizziness and headaches.  Psychiatric/Behavioral:   The patient is nervous/anxious.     Per HPI unless specifically indicated above     Objective:    BP 116/72   Pulse 76   Temp (!) 96.7 F (35.9 C) (Oral)   Wt 188 lb 6.4 oz (85.5 kg)   LMP 08/22/2008   SpO2 98%   BMI 30.41 kg/m   Wt Readings from Last 3 Encounters:  04/02/23 188 lb 6.4 oz (85.5 kg)  02/19/23 192 lb 3.2 oz (87.2 kg)  11/29/22 185 lb 11.2 oz (84.2 kg)    Physical Exam Vitals and nursing note reviewed.  Constitutional:      General: She is not in acute distress.    Appearance: Normal appearance. She is not ill-appearing, toxic-appearing or diaphoretic.  HENT:     Head: Normocephalic.     Right Ear: External ear normal.     Left Ear: External ear normal.     Nose: Nose normal.     Mouth/Throat:     Mouth: Mucous membranes are moist.     Pharynx: Oropharynx is clear.  Eyes:     General:        Right eye: No discharge.        Left eye: No discharge.     Extraocular Movements: Extraocular movements intact.     Conjunctiva/sclera: Conjunctivae normal.     Pupils: Pupils are equal, round, and reactive to light.  Cardiovascular:     Rate and Rhythm: Normal rate and regular rhythm.     Heart sounds: No murmur heard. Pulmonary:     Effort: Pulmonary effort is normal. No respiratory  distress.     Breath sounds: Normal breath sounds. No wheezing or rales.  Musculoskeletal:     Cervical back: Normal range of motion and neck supple.  Skin:    General: Skin is warm and dry.     Capillary Refill: Capillary refill takes less than 2 seconds.  Neurological:     General: No focal deficit present.     Mental Status: She is alert and oriented to person, place, and time. Mental status is at baseline.  Psychiatric:        Mood and Affect: Mood normal.        Behavior: Behavior normal.        Thought Content: Thought content normal.        Judgment: Judgment normal.     Results for orders placed or performed in visit on 04/01/23  Vitamin B12  Result Value Ref  Range   Vitamin B-12 702 180 - 914 pg/mL  Lactate dehydrogenase  Result Value Ref Range   LDH 159 98 - 192 U/L  Ferritin  Result Value Ref Range   Ferritin 41 11 - 307 ng/mL  Basic metabolic panel  Result Value Ref Range   Sodium 137 135 - 145 mmol/L   Potassium 4.3 3.5 - 5.1 mmol/L   Chloride 108 98 - 111 mmol/L   CO2 24 22 - 32 mmol/L   Glucose, Bld 93 70 - 99 mg/dL   BUN 33 (H) 8 - 23 mg/dL   Creatinine, Ser 1.61 (H) 0.44 - 1.00 mg/dL   Calcium 8.6 (L) 8.9 - 10.3 mg/dL   GFR, Estimated 35 (L) >60 mL/min   Anion gap 5 5 - 15  CBC with Differential/Platelet  Result Value Ref Range   WBC 9.5 4.0 - 10.5 K/uL   RBC 3.27 (L) 3.87 - 5.11 MIL/uL   Hemoglobin 10.8 (L) 12.0 - 15.0 g/dL   HCT 09.6 (L) 04.5 - 40.9 %   MCV 97.2 80.0 - 100.0 fL   MCH 33.0 26.0 - 34.0 pg   MCHC 34.0 30.0 - 36.0 g/dL   RDW 81.1 91.4 - 78.2 %   Platelets 452 (H) 150 - 400 K/uL   nRBC 0.0 0.0 - 0.2 %   Neutrophils Relative % 56 %   Neutro Abs 5.3 1.7 - 7.7 K/uL   Lymphocytes Relative 24 %   Lymphs Abs 2.3 0.7 - 4.0 K/uL   Monocytes Relative 16 %   Monocytes Absolute 1.5 (H) 0.1 - 1.0 K/uL   Eosinophils Relative 3 %   Eosinophils Absolute 0.2 0.0 - 0.5 K/uL   Basophils Relative 1 %   Basophils Absolute 0.1 0.0 - 0.1 K/uL   Immature Granulocytes 0 %   Abs Immature Granulocytes 0.02 0.00 - 0.07 K/uL  Iron and TIBC([facility])  Result Value Ref Range   Iron 91 28 - 170 ug/dL   TIBC 956 213 - 086 ug/dL   Saturation Ratios 27 10.4 - 31.8 %   UIBC 246 ug/dL      Assessment & Plan:   Problem List Items Addressed This Visit       Cardiovascular and Mediastinum   Hypertension - Primary    Chronic.  Controlled.  Continue with current medication regimen of enalapril and Chlorthalidone.  Medications are prescribed by [facility].  Labs from the cancer center reviewed during visit today.  Return to clinic in 6 months for reevaluation.  Call sooner if concerns arise.  Genitourinary    Stage 3a chronic kidney disease (CKD) (HCC)    Chronic, ongoing.  Followed by [facility] nephrology.  Recent note reviewed.  BMP reviewed from cancer center.         Other   Vitamin D deficiency    Labs reviewed from cancer center.         Anxiety    Chronic.  Controlled.  Continue with current medication regimen of PRN Klonopin.  Refilled at visit today.  Return to clinic in 6 months for reevaluation.  Call sooner if concerns arise.        B12 deficiency anemia    Labs reviewed from cancer center.          Follow up plan: Return in about 6 months (around 10/02/2023) for Physical and Fasting labs.

## 2023-04-02 NOTE — Assessment & Plan Note (Signed)
Labs reviewed from cancer center.    

## 2023-04-02 NOTE — Assessment & Plan Note (Signed)
Chronic.  Controlled.  Continue with current medication regimen of PRN Klonopin.  Refilled at visit today.  Return to clinic in 6 months for reevaluation.  Call sooner if concerns arise.

## 2023-04-03 ENCOUNTER — Inpatient Hospital Stay: Payer: Medicare Other

## 2023-04-03 ENCOUNTER — Encounter: Payer: Self-pay | Admitting: Internal Medicine

## 2023-04-03 ENCOUNTER — Inpatient Hospital Stay: Payer: Medicare Other | Admitting: Internal Medicine

## 2023-04-03 VITALS — BP 126/61 | HR 78 | Temp 98.7°F | Wt 189.7 lb

## 2023-04-03 DIAGNOSIS — D75839 Thrombocytosis, unspecified: Secondary | ICD-10-CM | POA: Diagnosis not present

## 2023-04-03 DIAGNOSIS — D72821 Monocytosis (symptomatic): Secondary | ICD-10-CM | POA: Diagnosis not present

## 2023-04-03 DIAGNOSIS — I129 Hypertensive chronic kidney disease with stage 1 through stage 4 chronic kidney disease, or unspecified chronic kidney disease: Secondary | ICD-10-CM | POA: Diagnosis not present

## 2023-04-03 DIAGNOSIS — N183 Chronic kidney disease, stage 3 unspecified: Secondary | ICD-10-CM | POA: Diagnosis not present

## 2023-04-03 DIAGNOSIS — E538 Deficiency of other specified B group vitamins: Secondary | ICD-10-CM | POA: Diagnosis not present

## 2023-04-03 DIAGNOSIS — D649 Anemia, unspecified: Secondary | ICD-10-CM

## 2023-04-03 DIAGNOSIS — D638 Anemia in other chronic diseases classified elsewhere: Secondary | ICD-10-CM | POA: Diagnosis not present

## 2023-04-03 DIAGNOSIS — Z79899 Other long term (current) drug therapy: Secondary | ICD-10-CM | POA: Diagnosis not present

## 2023-04-03 DIAGNOSIS — Z7982 Long term (current) use of aspirin: Secondary | ICD-10-CM | POA: Diagnosis not present

## 2023-04-03 NOTE — Progress Notes (Signed)
Patient has been having some indigestion recently. She also mentioned that she takes her friends pantoprazole and is trying to get a prescription for it but she doesn't see her GI doctor until August.

## 2023-04-03 NOTE — Assessment & Plan Note (Addendum)
#   Chronic normocytic anemia/CKD-IIl BL Hb 10]- Hb- OCT 2023 [s/p dog bite/infection]- Hb 8.2. Iron studies suggetsive of Anemia of chronic disease-   # Today Hb-10; Iron sat- 27; ferritin- 41; Continue oral iron. But will add venofer- x2.   # Chronic thrombocytosis and monocytosis [since 2012]. Possible myeloproliferative disorder such as CMML; April 2022-bone marrow biopsy-no acute process.  Over all stable.  # B12 deficiency [MAY 2022- b12-126] on B12 injection q 80M; but JUNE 2024- 702; defer to PCP re; b12 injections.   # CKD-III [Dr.Murphy, nephrology]-lupus [1987] versus others defer to nephrology- stable.   # Malabsorbtion sec to pancreas- [on Vanga]- on creon-stable; ? Reflux defer to GI/PCP.   # DISPOSITION: # print labs for pt # re-schedule venofer next week/ per pt preference; and 1 more [2 total- week apart] # follow up in 6 months- MD; 2-3 days prior-labs-cbc/bmp/ iron studies/ferritin; LDH-B12 levels; possible venofer- .  -Dr.B

## 2023-04-03 NOTE — Progress Notes (Signed)
[facility] CONSULT NOTE  Patient Care Team: Larae Grooms, NP as PCP - General (Nurse Practitioner) Ria Comment, FNP as Nurse Practitioner (Family Medicine) Kieth Brightly, MD (General Surgery) Kandyce Rud., MD (Rheumatology) Ardell Isaacs, Forrestine Him, MD as Consulting Physician (Obstetrics and Gynecology) Earna Coder, MD as Consulting Physician (Hematology and Oncology)  CHIEF COMPLAINTS/PURPOSE OF CONSULTATION: Anemia  # chronic  Anemia/CKD-III-IDA  # #B12 deficiency  #  chronic thrombocytosis and monocytosis [since 2012].   myeloproliferative disorder such as CMML- April 2022-bone marrow biopsy-no acute process- likely reactive MPN- work up NEGATIVE.  # CKD-III [Dr.Murphy, nephrology]-lupus versus others- [Dr.Murphy]    Oncology History   No history exists.   HISTORY OF PRESENTING ILLNESS: Alone.  Ambulating independently.  [name] 67 y.o.  female with a history of chronic anemia/chronic mild monocytosis/thrombocytosis/ CKD stage III is here for follow-up.  Patient has been having some indigestion recently. She also mentioned that she takes her friends pantoprazole and is trying to get a prescription for it but she doesn't see her GI doctor until August. Currently on PO iron once a day.   Complains of ongoing fatigue.  Denies any blood in stools or black or stools. no Nausea vomiting.    Review of Systems  Constitutional:  Positive for malaise/fatigue. Negative for chills, diaphoresis and fever.  HENT:  Negative for nosebleeds and sore throat.   Eyes:  Negative for double vision.  Respiratory:  Negative for cough, hemoptysis, sputum production, shortness of breath and wheezing.   Cardiovascular:  Negative for chest pain, palpitations, orthopnea and leg swelling.  Gastrointestinal:  Negative for abdominal pain, blood in stool, constipation, diarrhea, heartburn, melena, nausea and vomiting.  Genitourinary:   Negative for dysuria, frequency and urgency.  Musculoskeletal:  Positive for back pain and joint pain.  Skin: Negative.  Negative for itching and rash.  Neurological:  Negative for dizziness, tingling, focal weakness, weakness and headaches.  Endo/Heme/Allergies:  Does not bruise/bleed easily.  Psychiatric/Behavioral:  Negative for depression. The patient is not nervous/anxious and does not have insomnia.      MEDICAL HISTORY:  Past Medical History:  Diagnosis Date   Abnormal pap    Anemia    Anxiety    History of lupus 1987   Hypertension    Low vitamin D level    Renal insufficiency 1988   kidney biopsy 1988 and 1998- "my kidneys function at like 48%"    SURGICAL HISTORY: Past Surgical History:  Procedure Laterality Date   BONE MARROW BIOPSY  01/2021   BREAST BIOPSY Right 07/08/2017   BREAST EXCISIONAL BIOPSY Right 08/06/2017   BREAST EXCISIONAL BIOPSY Right 07/1999   BREAST LUMPECTOMY WITH RADIOACTIVE SEED LOCALIZATION Right 08/06/2017   Procedure: RIGHT BREAST LUMPECTOMY WITH RADIOACTIVE SEED LOCALIZATION ERAS PATHWAY;  Surgeon: Glenna Fellows, MD;  Location: [facility];  Service: General;  Laterality: Right;   BREAST LUMPECTOMY WITH RADIOACTIVE SEED LOCALIZATION Right 08/19/2018   Procedure: RIGHT BREAST LUMPECTOMY WITH RADIOACTIVE SEED LOCALIZATION ERAS PATHWAY;  Surgeon: Glenna Fellows, MD;  Location: [facility] OR;  Service: General;  Laterality: Right;   BREAST SURGERY  07/1999   right breast benign fibroadenoma   COLONOSCOPY  2012   COLONOSCOPY WITH PROPOFOL N/A 05/01/2017   Procedure: COLONOSCOPY WITH PROPOFOL;  Surgeon: Kieth Brightly, MD;  Location: [facility];  Service: Endoscopy;  Laterality: N/A;   COLONOSCOPY WITH PROPOFOL N/A 12/18/2021   Procedure: COLONOSCOPY WITH PROPOFOL;  Surgeon: Toney Reil,  MD;  Location: [facility];  Service: Gastroenterology;  Laterality: N/A;   CRYOTHERAPY     abnormal pap smear    ESOPHAGOGASTRODUODENOSCOPY (EGD) WITH PROPOFOL N/A 05/01/2017   Procedure: ESOPHAGOGASTRODUODENOSCOPY (EGD) WITH PROPOFOL;  Surgeon: Kieth Brightly, MD;  Location: [facility] END[facility];  Service: Endoscopy;  Laterality: N/A;   ESOPHAGOGASTRODUODENOSCOPY (EGD) WITH PROPOFOL N/A 12/18/2021   Procedure: ESOPHAGOGASTRODUODENOSCOPY (EGD) WITH PROPOFOL;  Surgeon: Toney Reil, MD;  Location: [facility];  Service: Gastroenterology;  Laterality: N/A;   NASAL SEPTUM SURGERY  2007   TUBAL LIGATION  1990    SOCIAL HISTORY: Social History   Socioeconomic History   Marital status: Widowed    Spouse name: Not on file   Number of children: Not on file   Years of education: Not on file   Highest education level: Not on file  Occupational History   Not on file  Tobacco Use   Smoking status: Never   Smokeless tobacco: Never  Vaping Use   Vaping Use: Never used  Substance and Sexual Activity   Alcohol use: Yes    Alcohol/week: 4.0 - 6.0 standard drinks of alcohol    Types: 4 - 6 Standard drinks or equivalent per week    Comment: few beers on friday and saturday   Drug use: Yes    Types: Marijuana    Comment: few times a month    Sexual activity: Yes    Partners: Male    Birth control/protection: Surgical, Post-menopausal    Comment: BTL  Other Topics Concern   Not on file  Social History Narrative   Husband died Nov 30, 2016 from lung cancer at age 66 yrs old.     Social Determinants of Health   Financial Resource Strain: Low Risk  (02/19/2023)   Overall Financial Resource Strain (CARDIA)    Difficulty of Paying Living Expenses: Not hard at all  Food Insecurity: No Food Insecurity (02/19/2023)   Hunger Vital Sign    Worried About Running Out of Food in the Last Year: Never true    Ran Out of Food in the Last Year: Never true  Transportation Needs: No Transportation Needs (02/19/2023)   PRAPARE - Administrator, Civil Service (Medical): No    Lack of Transportation  (Non-Medical): No  Physical Activity: Insufficiently Active (02/19/2023)   Exercise Vital Sign    Days of Exercise per Week: 5 days    Minutes of Exercise per Session: 20 min  Stress: No Stress Concern Present (02/19/2023)   [employer] of Occupational Health - Occupational Stress Questionnaire    Feeling of Stress : Only a little  Social Connections: Moderately Integrated (02/19/2023)   Social Connection and Isolation Panel [NHANES]    Frequency of Communication with Friends and Family: More than three times a week    Frequency of Social Gatherings with Friends and Family: Once a week    Attends Religious Services: More than 4 times per year    Active Member of [employer] or Organizations: Yes    Attends Banker Meetings: More than 4 times per year    Marital Status: Widowed  Intimate Partner Violence: Not At Risk (02/19/2023)   Humiliation, Afraid, Rape, and Kick questionnaire    Fear of Current or Ex-Partner: No    Emotionally Abused: No    Physically Abused: No    Sexually Abused: No    FAMILY HISTORY: Family History  Problem Relation Age of Onset   Cancer Father  oral cancer   Cancer Maternal Grandmother        ovarian cancer   Hypertension Maternal Grandmother    Diabetes Maternal Grandfather    Diabetes Paternal Aunt    Breast cancer Neg Hx     ALLERGIES:  is allergic to elemental sulfur, sulfa antibiotics, beef-derived products, lambs quarters, and milk-related compounds.  MEDICATIONS:  Current Outpatient Medications  Medication Sig Dispense Refill   aspirin EC 81 MG tablet Take 81 mg by mouth daily. Swallow whole.     chlorthalidone (HYGROTON) 25 MG tablet Take 12.5 mg by mouth in the morning.     Cholecalciferol (VITAMIN D3 PO) Take 1,000 Units by mouth daily.     clonazePAM (KLONOPIN) 0.5 MG tablet Take 1 tablet (0.5 mg total) by mouth daily as needed for anxiety. 20 tablet 0   cyanocobalamin 1000 MCG tablet Take 1,000 mcg by mouth daily.  Vitamin B12     enalapril (VASOTEC) 20 MG tablet Take 0.5 tablets (10 mg total) by mouth 2 (two) times daily.     Ferrous Sulfate (IRON) 325 (65 Fe) MG TABS Take 1 tablet by mouth daily.     fluticasone (FLONASE) 50 MCG/ACT nasal spray Place 2 sprays into both nostrils daily. 16 g 6   hydroxychloroquine (PLAQUENIL) 200 MG tablet Take 200 mg by mouth every other day.     lipase/protease/amylase (CREON) 36000 UNITS CPEP capsule Take 2 capsules with the first bite of each meal and 1 capsule with the first bite of each snack 240 capsule 2   meclizine (ANTIVERT) 12.5 MG tablet Take 1 tablet (12.5 mg total) by mouth 3 (three) times daily as needed for dizziness. 30 tablet 0   traZODone (DESYREL) 50 MG tablet TAKE 1 TABLET BY MOUTH AT BEDTIME AS NEEDED FOR SLEEP 90 tablet 0   No current facility-administered medications for this visit.    PHYSICAL EXAMINATION: ECOG PERFORMANCE STATUS: 1 - Symptomatic but completely ambulatory  Vitals:   04/03/23 1335  BP: 126/61  Pulse: 78  Temp: 98.7 F (37.1 C)  SpO2: 100%   Filed Weights   04/03/23 1335  Weight: 189 lb 11.2 oz (86 kg)    Physical Exam Vitals and nursing note reviewed.  Constitutional:      Comments:     HENT:     Head: Normocephalic and atraumatic.     Mouth/Throat:     Mouth: Mucous membranes are moist.     Pharynx: No oropharyngeal exudate.  Eyes:     Pupils: Pupils are equal, round, and reactive to light.  Cardiovascular:     Rate and Rhythm: Normal rate and regular rhythm.  Pulmonary:     Effort: No respiratory distress.     Breath sounds: No wheezing.     Comments: Decreased breath sounds bilaterally at bases.  No wheeze or crackles Abdominal:     General: Bowel sounds are normal. There is no distension.     Palpations: Abdomen is soft. There is no mass.     Tenderness: There is no abdominal tenderness. There is no guarding or rebound.  Musculoskeletal:        General: No tenderness. Normal range of motion.      Cervical back: Normal range of motion and neck supple.  Skin:    General: Skin is warm.  Neurological:     Mental Status: She is alert and oriented to person, place, and time.  Psychiatric:        Mood and Affect: Affect normal.  Judgment: Judgment normal.     LABORATORY DATA:  I have reviewed the data as listed Lab Results  Component Value Date   WBC 9.5 04/01/2023   HGB 10.8 (L) 04/01/2023   HCT 31.8 (L) 04/01/2023   MCV 97.2 04/01/2023   PLT 452 (H) 04/01/2023   Recent Labs    08/20/22 1418 08/21/22 1457 09/24/22 0934 11/29/22 1507 04/01/23 1357  NA 134* 135 137 139 137  K 4.2 4.9 4.4 4.7 4.3  CL 105 100 105 102 108  CO2 24 22 23 22 24   GLUCOSE 130* 86 102* 77 93  BUN 21 19 27* 28* 33*  CREATININE 1.44* 1.55* 1.52* 1.36* 1.60*  CALCIUM 8.7* 9.4 8.9 9.4 8.6*  GFRNONAA 40*  --  37*  --  35*  PROT  --  6.8  --  6.7  --   ALBUMIN  --  4.2  --  4.3  --   AST  --  14  --  19  --   ALT  --  7  --  9  --   ALKPHOS  --  85  --  97  --   BILITOT  --  0.4  --  <0.2  --     RADIOGRAPHIC STUDIES: I have personally reviewed the radiological images as listed and agreed with the findings in the report. No results found.  ASSESSMENT & PLAN:   Symptomatic anemia # Chronic normocytic anemia/CKD-IIl BL Hb 10]- Hb- OCT 2023 [s/p dog bite/infection]- Hb 8.2. Iron studies suggetsive of Anemia of chronic disease-   # Today Hb-10; Iron sat- 27; ferritin- 41; Continue oral iron. But will add venofer- x2.   # Chronic thrombocytosis and monocytosis [since 2012]. Possible myeloproliferative disorder such as CMML; April 2022-bone marrow biopsy-no acute process.  Over all stable.  # B12 deficiency [MAY 2022- b12-126] on B12 injection q 72M; but JUNE 2024- 702; defer to PCP re; b12 injections.   # CKD-III [Dr.Murphy, nephrology]-lupus [1987] versus others defer to nephrology- stable.   # Malabsorbtion sec to pancreas- [on Vanga]- on creon-stable; ? Reflux defer to GI/PCP.   #  DISPOSITION: # print labs for pt # re-schedule venofer next week/ per pt preference; and 1 more [2 total- week apart] # follow up in 6 months- MD; 2-3 days prior-labs-cbc/bmp/ iron studies/ferritin; LDH-B12 levels; possible venofer- .  -Dr.B  All questions were answered. The patient knows to call the clinic with any problems, questions or concerns.    Earna Coder, MD 04/03/2023 2:11 PM

## 2023-04-04 NOTE — Progress Notes (Deleted)
67 y.o. G55P1001 Widowed Caucasian female here for annual exam.    PCP:     Patient's last menstrual period was 08/22/2008.           Sexually active: {yes no:314532}  The current method of family planning is post menopausal status.    Exercising: {yes no:314532}  {types:19826} Smoker:  no  Health Maintenance: Pap:  04/04/20 neg, 04/01/19 neg History of abnormal Pap:  yes, hx of cryo years ago MMG:  07/11/22 Breast Density Cat B, BI-RADS CAT 1 neg Colonoscopy:  12/18/21 BMD:   07/06/19  Result  normal TDaP:  03/27/18 Gardasil:   no HIV: 08/12/22 NR Hep C: 03/23/16 neg Screening Labs:  Hb today: ***, Urine today: ***   reports that she has never smoked. She has never used smokeless tobacco. She reports current alcohol use of about 4.0 - 6.0 standard drinks of alcohol per week. She reports current drug use. Drug: Marijuana.  Past Medical History:  Diagnosis Date   Abnormal pap    Anemia    Anxiety    History of lupus 1987   Hypertension    Low vitamin D level    Renal insufficiency 1988   kidney biopsy 1988 and 1998- "my kidneys function at like 48%"    Past Surgical History:  Procedure Laterality Date   BONE MARROW BIOPSY  01/2021   BREAST BIOPSY Right 07/08/2017   BREAST EXCISIONAL BIOPSY Right 08/06/2017   BREAST EXCISIONAL BIOPSY Right 07/1999   BREAST LUMPECTOMY WITH RADIOACTIVE SEED LOCALIZATION Right 08/06/2017   Procedure: RIGHT BREAST LUMPECTOMY WITH RADIOACTIVE SEED LOCALIZATION ERAS PATHWAY;  Surgeon: Glenna Fellows, MD;  Location: [facility];  Service: General;  Laterality: Right;   BREAST LUMPECTOMY WITH RADIOACTIVE SEED LOCALIZATION Right 08/19/2018   Procedure: RIGHT BREAST LUMPECTOMY WITH RADIOACTIVE SEED LOCALIZATION ERAS PATHWAY;  Surgeon: Glenna Fellows, MD;  Location: [facility];  Service: General;  Laterality: Right;   BREAST SURGERY  07/1999   right breast benign fibroadenoma   COLONOSCOPY  2012   COLONOSCOPY WITH PROPOFOL N/A 05/01/2017    Procedure: COLONOSCOPY WITH PROPOFOL;  Surgeon: Kieth Brightly, MD;  Location: [facility] ENDOSCOPY;  Service: Endoscopy;  Laterality: N/A;   COLONOSCOPY WITH PROPOFOL N/A 12/18/2021   Procedure: COLONOSCOPY WITH PROPOFOL;  Surgeon: Toney Reil, MD;  Location: [facility] ENDOSCOPY;  Service: Gastroenterology;  Laterality: N/A;   CRYOTHERAPY     abnormal pap smear   ESOPHAGOGASTRODUODENOSCOPY (EGD) WITH PROPOFOL N/A 05/01/2017   Procedure: ESOPHAGOGASTRODUODENOSCOPY (EGD) WITH PROPOFOL;  Surgeon: Kieth Brightly, MD;  Location: [facility] ENDOSCOPY;  Service: Endoscopy;  Laterality: N/A;   ESOPHAGOGASTRODUODENOSCOPY (EGD) WITH PROPOFOL N/A 12/18/2021   Procedure: ESOPHAGOGASTRODUODENOSCOPY (EGD) WITH PROPOFOL;  Surgeon: Toney Reil, MD;  Location: [facility] ENDOSCOPY;  Service: Gastroenterology;  Laterality: N/A;   NASAL SEPTUM SURGERY  2007   TUBAL LIGATION  1990    Current Outpatient Medications  Medication Sig Dispense Refill   aspirin EC 81 MG tablet Take 81 mg by mouth daily. Swallow whole.     chlorthalidone (HYGROTON) 25 MG tablet Take 12.5 mg by mouth in the morning.     Cholecalciferol (VITAMIN D3 PO) Take 1,000 Units by mouth daily.     clonazePAM (KLONOPIN) 0.5 MG tablet Take 1 tablet (0.5 mg total) by mouth daily as needed for anxiety. 20 tablet 0   cyanocobalamin 1000 MCG tablet Take 1,000 mcg by mouth daily. Vitamin B12     enalapril (VASOTEC) 20 MG tablet Take 0.5 tablets (10  mg total) by mouth 2 (two) times daily.     Ferrous Sulfate (IRON) 325 (65 Fe) MG TABS Take 1 tablet by mouth daily.     fluticasone (FLONASE) 50 MCG/ACT nasal spray Place 2 sprays into both nostrils daily. 16 g 6   hydroxychloroquine (PLAQUENIL) 200 MG tablet Take 200 mg by mouth every other day.     lipase/protease/amylase (CREON) 36000 UNITS CPEP capsule Take 2 capsules with the first bite of each meal and 1 capsule with the first bite of each snack 240 capsule 2   meclizine (ANTIVERT) 12.5 MG  tablet Take 1 tablet (12.5 mg total) by mouth 3 (three) times daily as needed for dizziness. 30 tablet 0   traZODone (DESYREL) 50 MG tablet TAKE 1 TABLET BY MOUTH AT BEDTIME AS NEEDED FOR SLEEP 90 tablet 0   No current facility-administered medications for this visit.    Family History  Problem Relation Age of Onset   Cancer Father        oral cancer   Cancer Maternal Grandmother        ovarian cancer   Hypertension Maternal Grandmother    Diabetes Maternal Grandfather    Diabetes Paternal Aunt    Breast cancer Neg Hx     Review of Systems  Exam:   LMP 08/22/2008     General appearance: alert, cooperative and appears stated age Head: normocephalic, without obvious abnormality, atraumatic Neck: no adenopathy, supple, symmetrical, trachea midline and thyroid normal to inspection and palpation Lungs: clear to auscultation bilaterally Breasts: normal appearance, no masses or tenderness, No nipple retraction or dimpling, No nipple discharge or bleeding, No axillary adenopathy Heart: regular rate and rhythm Abdomen: soft, non-tender; no masses, no organomegaly Extremities: extremities normal, atraumatic, no cyanosis or edema Skin: skin color, texture, turgor normal. No rashes or lesions Lymph nodes: cervical, supraclavicular, and axillary nodes normal. Neurologic: grossly normal  Pelvic: External genitalia:  no lesions              No abnormal inguinal nodes palpated.              Urethra:  normal appearing urethra with no masses, tenderness or lesions              Bartholins and Skenes: normal                 Vagina: normal appearing vagina with normal color and discharge, no lesions              Cervix: no lesions              Pap taken: {yes no:314532} Bimanual Exam:  Uterus:  normal size, contour, position, consistency, mobility, non-tender              Adnexa: no mass, fullness, tenderness              Rectal exam: {yes no:314532}.  Confirms.              Anus:  normal  sphincter tone, no lesions  Chaperone was present for exam:  ***  Assessment:   Well woman visit with gynecologic exam.   Plan: Mammogram screening discussed. Self breast awareness reviewed. Pap and HR HPV as above. Guidelines for Calcium, Vitamin D, regular exercise program including cardiovascular and weight bearing exercise.   Follow up annually and prn.   Additional counseling given.  {yes [id number]. _______ minutes face to face time of which over 50% was spent in counseling.  After visit summary provided.

## 2023-04-09 ENCOUNTER — Ambulatory Visit: Payer: Medicare Other | Admitting: Obstetrics and Gynecology

## 2023-04-11 ENCOUNTER — Inpatient Hospital Stay: Payer: Medicare Other

## 2023-04-11 VITALS — BP 128/70 | HR 74 | Temp 98.4°F | Resp 17

## 2023-04-11 DIAGNOSIS — N183 Chronic kidney disease, stage 3 unspecified: Secondary | ICD-10-CM | POA: Diagnosis not present

## 2023-04-11 DIAGNOSIS — D638 Anemia in other chronic diseases classified elsewhere: Secondary | ICD-10-CM | POA: Diagnosis not present

## 2023-04-11 DIAGNOSIS — D72821 Monocytosis (symptomatic): Secondary | ICD-10-CM | POA: Diagnosis not present

## 2023-04-11 DIAGNOSIS — I129 Hypertensive chronic kidney disease with stage 1 through stage 4 chronic kidney disease, or unspecified chronic kidney disease: Secondary | ICD-10-CM | POA: Diagnosis not present

## 2023-04-11 DIAGNOSIS — E538 Deficiency of other specified B group vitamins: Secondary | ICD-10-CM | POA: Diagnosis not present

## 2023-04-11 DIAGNOSIS — Z7982 Long term (current) use of aspirin: Secondary | ICD-10-CM | POA: Diagnosis not present

## 2023-04-11 DIAGNOSIS — Z79899 Other long term (current) drug therapy: Secondary | ICD-10-CM | POA: Diagnosis not present

## 2023-04-11 DIAGNOSIS — D519 Vitamin B12 deficiency anemia, unspecified: Secondary | ICD-10-CM

## 2023-04-11 DIAGNOSIS — D75839 Thrombocytosis, unspecified: Secondary | ICD-10-CM | POA: Diagnosis not present

## 2023-04-11 MED ORDER — SODIUM CHLORIDE 0.9 % IV SOLN
Freq: Once | INTRAVENOUS | Status: AC
  Filled 2023-04-11: qty 250

## 2023-04-11 MED ORDER — SODIUM CHLORIDE 0.9 % IV SOLN
200.0000 mg | Freq: Once | INTRAVENOUS | Status: AC
  Administered 2023-04-11: 200 mg via INTRAVENOUS
  Filled 2023-04-11: qty 200

## 2023-04-11 MED ORDER — SODIUM CHLORIDE 0.9% FLUSH
10.0000 mL | Freq: Once | INTRAVENOUS | Status: AC
  Administered 2023-04-11: 10 mL via INTRAVENOUS
  Filled 2023-04-11: qty 10

## 2023-04-11 NOTE — Patient Instructions (Signed)

## 2023-04-11 NOTE — Progress Notes (Signed)
Patient tolerated Venofer infusion well, no questions/concerns voiced. Monitored 30 min post transfusion. Patient stable at discharge. VSS. AVS given.    

## 2023-04-15 DIAGNOSIS — H52223 Regular astigmatism, bilateral: Secondary | ICD-10-CM | POA: Diagnosis not present

## 2023-04-15 DIAGNOSIS — H5213 Myopia, bilateral: Secondary | ICD-10-CM | POA: Diagnosis not present

## 2023-04-15 DIAGNOSIS — H43812 Vitreous degeneration, left eye: Secondary | ICD-10-CM | POA: Diagnosis not present

## 2023-04-15 DIAGNOSIS — H2513 Age-related nuclear cataract, bilateral: Secondary | ICD-10-CM | POA: Diagnosis not present

## 2023-04-15 DIAGNOSIS — Z8669 Personal history of other diseases of the nervous system and sense organs: Secondary | ICD-10-CM | POA: Diagnosis not present

## 2023-04-15 DIAGNOSIS — Z79899 Other long term (current) drug therapy: Secondary | ICD-10-CM | POA: Diagnosis not present

## 2023-04-15 DIAGNOSIS — H524 Presbyopia: Secondary | ICD-10-CM | POA: Diagnosis not present

## 2023-04-15 DIAGNOSIS — M3210 Systemic lupus erythematosus, organ or system involvement unspecified: Secondary | ICD-10-CM | POA: Diagnosis not present

## 2023-04-18 ENCOUNTER — Ambulatory Visit: Payer: Medicare Other | Admitting: Obstetrics and Gynecology

## 2023-04-18 ENCOUNTER — Inpatient Hospital Stay: Payer: Medicare Other

## 2023-04-18 VITALS — BP 118/52 | HR 71 | Temp 97.9°F | Resp 16

## 2023-04-18 DIAGNOSIS — D75839 Thrombocytosis, unspecified: Secondary | ICD-10-CM | POA: Diagnosis not present

## 2023-04-18 DIAGNOSIS — D638 Anemia in other chronic diseases classified elsewhere: Secondary | ICD-10-CM | POA: Diagnosis not present

## 2023-04-18 DIAGNOSIS — D72821 Monocytosis (symptomatic): Secondary | ICD-10-CM | POA: Diagnosis not present

## 2023-04-18 DIAGNOSIS — Z79899 Other long term (current) drug therapy: Secondary | ICD-10-CM | POA: Diagnosis not present

## 2023-04-18 DIAGNOSIS — D519 Vitamin B12 deficiency anemia, unspecified: Secondary | ICD-10-CM

## 2023-04-18 DIAGNOSIS — N183 Chronic kidney disease, stage 3 unspecified: Secondary | ICD-10-CM | POA: Diagnosis not present

## 2023-04-18 DIAGNOSIS — E538 Deficiency of other specified B group vitamins: Secondary | ICD-10-CM | POA: Diagnosis not present

## 2023-04-18 DIAGNOSIS — Z7982 Long term (current) use of aspirin: Secondary | ICD-10-CM | POA: Diagnosis not present

## 2023-04-18 DIAGNOSIS — I129 Hypertensive chronic kidney disease with stage 1 through stage 4 chronic kidney disease, or unspecified chronic kidney disease: Secondary | ICD-10-CM | POA: Diagnosis not present

## 2023-04-18 MED ORDER — SODIUM CHLORIDE 0.9 % IV SOLN
Freq: Once | INTRAVENOUS | Status: AC
  Filled 2023-04-18: qty 250

## 2023-04-18 MED ORDER — SODIUM CHLORIDE 0.9 % IV SOLN
200.0000 mg | Freq: Once | INTRAVENOUS | Status: AC
  Administered 2023-04-18: 200 mg via INTRAVENOUS
  Filled 2023-04-18: qty 200

## 2023-05-01 DIAGNOSIS — M329 Systemic lupus erythematosus, unspecified: Secondary | ICD-10-CM | POA: Diagnosis not present

## 2023-05-01 DIAGNOSIS — N183 Chronic kidney disease, stage 3 unspecified: Secondary | ICD-10-CM | POA: Diagnosis not present

## 2023-05-01 DIAGNOSIS — Z79899 Other long term (current) drug therapy: Secondary | ICD-10-CM | POA: Diagnosis not present

## 2023-05-06 ENCOUNTER — Other Ambulatory Visit: Payer: Self-pay | Admitting: Nurse Practitioner

## 2023-05-07 NOTE — Telephone Encounter (Signed)
Requested medication (s) are due for refill today: yes   Requested medication (s) are on the active medication list: yes   Last refill:  02/05/23 #90 0 refills  Future visit scheduled: yes in 4 months   Notes to clinic:  no refills remain. Do you want to refill Rx?     Requested Prescriptions  Pending Prescriptions Disp Refills   traZODone (DESYREL) 50 MG tablet [Pharmacy Med Name: TRAZODONE 50 MG TABLET] 90 tablet 0    Sig: TAKE 1 TABLET BY MOUTH EVERY DAY AT BEDTIME AS NEEDED FOR SLEEP     Psychiatry: Antidepressants - Serotonin Modulator Passed - 05/06/2023 10:28 AM      Passed - Completed PHQ-2 or PHQ-9 in the last 360 days      Passed - Valid encounter within last 6 months    Recent Outpatient Visits           1 month ago Primary hypertension   [place] [facility] Larae Grooms, NP   5 months ago Stage 3a chronic kidney disease (CKD) (HCC)   [place] [facility] Larae Grooms, NP   6 months ago Anemia due to vitamin B12 deficiency, unspecified B12 deficiency type   [place] [facility] Mecum, Erin E, PA-C   8 months ago Anemia due to vitamin B12 deficiency, unspecified B12 deficiency type   [place] [facility] Larae Grooms, NP   8 months ago Dog bite of lower leg, unspecified laterality, initial encounter   [place] [facility] Larae Grooms, NP       Future Appointments             In 4 months Larae Grooms, NP [place] [facility]

## 2023-05-10 NOTE — Progress Notes (Signed)
67 y.o. G96P1001 Widowed Caucasian female here for annual exam.    Has a little bit of bleeding with intercourse for 6 months.  No partner change.  No bleeding outside of intercourse.   Struggling with her weight.   Has anemia. Done iron infusions.   Taking Creon for decreased pancreatic function.   Has food allergies.   PCP:   Larae Grooms, NP  Patient's last menstrual period was 08/22/2008.           Sexually active: Yes.    The current method of family planning is post menopausal status.    Exercising: Yes.     walking Smoker:  no  Health Maintenance: Pap:  04/04/20 neg, 04/01/19 neg History of abnormal Pap:  yes, hx of cryo years ago MMG:  07/11/22 Breast Density Cat B, BI-RADS CAT 1 neg Colonoscopy:  12/18/21 BMD:   07/06/19  Result  normal TDaP:  03/27/18 Gardasil:   no HIV: 08/12/22 NR Hep C: 03/23/16 neg Screening Labs:  PCP   reports that she has never smoked. She has never used smokeless tobacco. She reports current alcohol use of about 4.0 - 6.0 standard drinks of alcohol per week. She reports current drug use. Drug: Marijuana.  Past Medical History:  Diagnosis Date   Abnormal pap    Anemia    Anxiety    History of lupus 1987   Hypertension    Low vitamin D level    Renal insufficiency 1988   kidney biopsy 1988 and 1998- "my kidneys function at like 48%"    Past Surgical History:  Procedure Laterality Date   BONE MARROW BIOPSY  01/2021   BREAST BIOPSY Right 07/08/2017   BREAST EXCISIONAL BIOPSY Right 08/06/2017   BREAST EXCISIONAL BIOPSY Right 07/1999   BREAST LUMPECTOMY WITH RADIOACTIVE SEED LOCALIZATION Right 08/06/2017   Procedure: RIGHT BREAST LUMPECTOMY WITH RADIOACTIVE SEED LOCALIZATION ERAS PATHWAY;  Surgeon: Glenna Fellows, MD;  Location: [facility];  Service: General;  Laterality: Right;   BREAST LUMPECTOMY WITH RADIOACTIVE SEED LOCALIZATION Right 08/19/2018   Procedure: RIGHT BREAST LUMPECTOMY WITH RADIOACTIVE SEED  LOCALIZATION ERAS PATHWAY;  Surgeon: Glenna Fellows, MD;  Location: [facility];  Service: General;  Laterality: Right;   BREAST SURGERY  07/1999   right breast benign fibroadenoma   COLONOSCOPY  2012   COLONOSCOPY WITH PROPOFOL N/A 05/01/2017   Procedure: COLONOSCOPY WITH PROPOFOL;  Surgeon: Kieth Brightly, MD;  Location: [facility] ENDOSCOPY;  Service: Endoscopy;  Laterality: N/A;   COLONOSCOPY WITH PROPOFOL N/A 12/18/2021   Procedure: COLONOSCOPY WITH PROPOFOL;  Surgeon: Toney Reil, MD;  Location: [facility] ENDOSCOPY;  Service: Gastroenterology;  Laterality: N/A;   CRYOTHERAPY     abnormal pap smear   ESOPHAGOGASTRODUODENOSCOPY (EGD) WITH PROPOFOL N/A 05/01/2017   Procedure: ESOPHAGOGASTRODUODENOSCOPY (EGD) WITH PROPOFOL;  Surgeon: Kieth Brightly, MD;  Location: [facility] ENDOSCOPY;  Service: Endoscopy;  Laterality: N/A;   ESOPHAGOGASTRODUODENOSCOPY (EGD) WITH PROPOFOL N/A 12/18/2021   Procedure: ESOPHAGOGASTRODUODENOSCOPY (EGD) WITH PROPOFOL;  Surgeon: Toney Reil, MD;  Location: [facility] ENDOSCOPY;  Service: Gastroenterology;  Laterality: N/A;   NASAL SEPTUM SURGERY  2007   TUBAL LIGATION  1990    Current Outpatient Medications  Medication Sig Dispense Refill   aspirin EC 81 MG tablet Take 81 mg by mouth daily. Swallow whole.     chlorthalidone (HYGROTON) 25 MG tablet Take 12.5 mg by mouth in the morning.     Cholecalciferol (VITAMIN D3 PO) Take 1,000 Units by mouth daily.  clonazePAM (KLONOPIN) 0.5 MG tablet Take 1 tablet (0.5 mg total) by mouth daily as needed for anxiety. 20 tablet 0   cyanocobalamin 1000 MCG tablet Take 1,000 mcg by mouth daily. Vitamin B12     enalapril (VASOTEC) 20 MG tablet Take 0.5 tablets (10 mg total) by mouth 2 (two) times daily.     Ferrous Sulfate (IRON) 325 (65 Fe) MG TABS Take 1 tablet by mouth daily.     fluticasone (FLONASE) 50 MCG/ACT nasal spray Place 2 sprays into both nostrils daily. 16 g 6   hydroxychloroquine (PLAQUENIL) 200 MG  tablet Take 200 mg by mouth every other day.     lipase/protease/amylase (CREON) 36000 UNITS CPEP capsule Take 2 capsules with the first bite of each meal and 1 capsule with the first bite of each snack 240 capsule 2   meclizine (ANTIVERT) 12.5 MG tablet Take 1 tablet (12.5 mg total) by mouth 3 (three) times daily as needed for dizziness. 30 tablet 0   traMADol (ULTRAM) 50 MG tablet Take by mouth.     traZODone (DESYREL) 50 MG tablet TAKE 1 TABLET BY MOUTH EVERY DAY AT BEDTIME AS NEEDED FOR SLEEP 90 tablet 0   No current facility-administered medications for this visit.    Family History  Problem Relation Age of Onset   Cancer Father        oral cancer   Cancer Maternal Grandmother        ovarian cancer   Hypertension Maternal Grandmother    Diabetes Maternal Grandfather    Diabetes Paternal Aunt    Breast cancer Neg Hx     Review of Systems  All other systems reviewed and are negative.   Exam:   BP 122/82 (BP Location: Left Arm, Patient Position: Sitting, Cuff Size: Normal)   Pulse 63   Ht 5\' 6"  (1.676 m)   Wt 188 lb (85.3 kg)   LMP 08/22/2008   SpO2 100%   BMI 30.34 kg/m     General appearance: alert, cooperative and appears stated age Head: normocephalic, without obvious abnormality, atraumatic Neck: no adenopathy, supple, symmetrical, trachea midline and thyroid normal to inspection and palpation Lungs: clear to auscultation bilaterally Breasts: normal appearance, no masses or tenderness, No nipple retraction or dimpling, No nipple discharge or bleeding, No axillary adenopathy Heart: regular rate and rhythm Abdomen: soft, non-tender; no masses, no organomegaly Extremities: extremities normal, atraumatic, no cyanosis or edema Skin: skin color, texture, turgor normal. No rashes or lesions Lymph nodes: cervical, supraclavicular, and axillary nodes normal. Neurologic: grossly normal  Pelvic: External genitalia:  no lesions              No abnormal inguinal nodes  palpated.              Urethra:  normal appearing urethra with no masses, tenderness or lesions              Bartholins and Skenes: normal                 Vagina: normal appearing vagina with normal color and discharge, no lesions              Cervix: no lesions              Pap taken: yes Bimanual Exam:  Uterus:  normal size, contour, position, consistency, mobility, non-tender              Adnexa: no mass, fullness, tenderness  Rectal exam: yes.  Confirms.              Anus:  normal sphincter tone, no lesions  Chaperone was present for exam:  Warren Lacy, CMA  Assessment:   Well woman visit with gynecologic exam. Remote hx of cryotherapy to cervix.  Cervical cancer screening.  Postcoital bleeding.  Possible atrophy. STD screening.  Hx breast biopsies.  Chronic renal disease.  Lupus.   Plan: Mammogram screening discussed. Self breast awareness reviewed. Pap and HR HPV collected. GC/CT/trich testing.  Guidelines for Calcium, Vitamin D, regular exercise program including cardiovascular and weight bearing exercise. Vaginal lubricants reviewed:  water based, vit E containing, and cooking oils. Vaginal estrogen also reviewed.  If bleeding persists, will do pelvic US.  Patient educated to this today.  Fu in 2 years and prn.   30 min  total time was spent for this patient encounter, including preparation, face-to-face counseling with the patient, coordination of care, and documentation of the encounter in addition to doing breast and pelvic exam and pap.

## 2023-05-15 ENCOUNTER — Encounter: Payer: Self-pay | Admitting: Obstetrics and Gynecology

## 2023-05-15 ENCOUNTER — Ambulatory Visit: Payer: Medicare Other | Admitting: Obstetrics and Gynecology

## 2023-05-15 ENCOUNTER — Other Ambulatory Visit (HOSPITAL_COMMUNITY)
Admission: RE | Admit: 2023-05-15 | Discharge: 2023-05-15 | Disposition: A | Discharge status: Home / Self Care | Payer: Medicare Other | Source: Ambulatory Visit | Attending: Obstetrics and Gynecology | Admitting: Obstetrics and Gynecology

## 2023-05-15 VITALS — BP 122/82 | HR 63 | Ht 66.0 in | Wt 188.0 lb

## 2023-05-15 DIAGNOSIS — H43812 Vitreous degeneration, left eye: Secondary | ICD-10-CM | POA: Diagnosis not present

## 2023-05-15 DIAGNOSIS — Z01419 Encounter for gynecological examination (general) (routine) without abnormal findings: Secondary | ICD-10-CM | POA: Diagnosis not present

## 2023-05-15 DIAGNOSIS — N930 Postcoital and contact bleeding: Secondary | ICD-10-CM

## 2023-05-15 DIAGNOSIS — M3210 Systemic lupus erythematosus, organ or system involvement unspecified: Secondary | ICD-10-CM | POA: Diagnosis not present

## 2023-05-15 DIAGNOSIS — H2513 Age-related nuclear cataract, bilateral: Secondary | ICD-10-CM | POA: Diagnosis not present

## 2023-05-15 DIAGNOSIS — Z124 Encounter for screening for malignant neoplasm of cervix: Secondary | ICD-10-CM

## 2023-05-15 DIAGNOSIS — Z79899 Other long term (current) drug therapy: Secondary | ICD-10-CM | POA: Diagnosis not present

## 2023-05-15 DIAGNOSIS — Z01411 Encounter for gynecological examination (general) (routine) with abnormal findings: Secondary | ICD-10-CM | POA: Diagnosis present

## 2023-05-15 DIAGNOSIS — Z1151 Encounter for screening for human papillomavirus (HPV): Secondary | ICD-10-CM | POA: Insufficient documentation

## 2023-05-15 DIAGNOSIS — Z113 Encounter for screening for infections with a predominantly sexual mode of transmission: Secondary | ICD-10-CM

## 2023-05-15 DIAGNOSIS — H524 Presbyopia: Secondary | ICD-10-CM | POA: Diagnosis not present

## 2023-05-15 DIAGNOSIS — H5213 Myopia, bilateral: Secondary | ICD-10-CM | POA: Diagnosis not present

## 2023-05-15 DIAGNOSIS — Z8669 Personal history of other diseases of the nervous system and sense organs: Secondary | ICD-10-CM | POA: Diagnosis not present

## 2023-05-15 DIAGNOSIS — H52223 Regular astigmatism, bilateral: Secondary | ICD-10-CM | POA: Diagnosis not present

## 2023-05-15 NOTE — Patient Instructions (Signed)

## 2023-06-04 DIAGNOSIS — D2372 Other benign neoplasm of skin of left lower limb, including hip: Secondary | ICD-10-CM | POA: Diagnosis not present

## 2023-06-04 DIAGNOSIS — M79672 Pain in left foot: Secondary | ICD-10-CM | POA: Diagnosis not present

## 2023-06-05 ENCOUNTER — Telehealth: Payer: Self-pay | Admitting: Gastroenterology

## 2023-06-05 NOTE — Telephone Encounter (Signed)
Patient called in to confirm she can keep her appointment with Dr.Vanga.

## 2023-06-05 NOTE — Telephone Encounter (Signed)
Called the patient  back to let her know we received her voicemail and to inform her that the message has been sent. She said that she spoke to you yesterday. She wanted me to let you know that she don't need to moved she is able to come 06/12/23 at 1 pm.

## 2023-06-12 ENCOUNTER — Ambulatory Visit: Payer: Medicare Other | Admitting: Gastroenterology

## 2023-06-12 ENCOUNTER — Encounter: Payer: Self-pay | Admitting: Gastroenterology

## 2023-06-12 VITALS — BP 128/70 | HR 86 | Temp 98.3°F | Wt 189.6 lb

## 2023-06-12 DIAGNOSIS — K8681 Exocrine pancreatic insufficiency: Secondary | ICD-10-CM

## 2023-06-12 DIAGNOSIS — K219 Gastro-esophageal reflux disease without esophagitis: Secondary | ICD-10-CM

## 2023-06-12 MED ORDER — PANTOPRAZOLE SODIUM 20 MG PO TBEC
20.0000 mg | DELAYED_RELEASE_TABLET | Freq: Every day | ORAL | 1 refills | Status: DC
Start: 2023-06-12 — End: 2023-08-14

## 2023-06-12 NOTE — Progress Notes (Signed)
Arlyss Repress, MD [address]  [address]  [place], [place] [address]  Main: [phone]  Fax: [phone]    Gastroenterology Consultation  Referring Provider:     Larae Grooms, NP Primary Care Physician:  Larae Grooms, NP Primary Gastroenterologist:  Dr. Arlyss Repress Reason for Consultation: Exocrine pancreatic insufficiency        HPI:   [name] is a 67 y.o. female referred by Dr. Larae Grooms, NP  for consultation & management of chronic symptoms of sporadic episodes of postprandial urgency, nonbloody diarrhea.  She reports that she has been having the symptoms for almost a year.  Patient denies any nausea, vomiting, abdominal pain, weight loss or loss of appetite.  She does state that she makes her coffee "blonde " by adding creamer.  She does consume sweet tea, 2 cups daily.  She does report abdominal bloating.  Patient has history of stage II-III CKD, lupus, anemia of chronic disease.  Follow-up visit 03/12/2022 Patient is here for follow-up of postprandial urgency and diarrhea.  She underwent repeat upper endoscopy and colonoscopy including biopsies which were unremarkable.  Her pancreatic fecal elastase levels were low at 117, alpha gal panel was abnormal for beef and lamb IgE levels, food allergy profile was abnormal for milk IgE levels.  Therefore, I have advised patient to eliminate these food products as well as start pancreatic enzyme replacement therapy.  She is currently on Creon and awaiting for patient assistance program for long-term maintenance therapy.  Patient reports that when she went off of milk, beef and lamb for 1 month, she felt significantly better.  She started reintroducing these foods with Creon and noticed that she is able to tolerate better without much GI symptoms.  She currently ran out of Creon.  Follow-up visit 05/03/2022 Patient made a sooner appointment in order to discuss more about pancreatic insufficiency.   Patient is currently on Creon 72 K 2 capsules with each meal and 1 with snack.  Patient is gaining weight.  She has significantly cut back on creamer with coffee.  She is unable to stay away from red meat and has somewhat reintroduced.  She is not experiencing diarrheal episodes as before.  She had questions regarding etiology of pancreatic insufficiency.  She underwent CT abdomen and pelvis which was unremarkable.  Follow-up visit 06/12/2023 [name] is here for follow-up of exocrine pancreatic insufficiency.  Her symptoms of bloating and diarrhea are under control on pancreatic enzyme replacement therapy.  She gained about 5 pounds since last visit.  Today, she is concerned about frequent episodes of burping and belching after a meal.  She tried her friend's Protonix 20 mg and it helped.  She has also tried over-the-counter antacids which relieved her symptoms.  She is requesting for prescription for Protonix.  She continues to take Creon 36K 2 capsules with meal and 1 with snack  Patient does not smoke or drink alcohol  NSAIDs: None  Antiplts/Anticoagulants/Anti thrombotics: None  GI Procedures:  EGD and colonoscopy 12/18/2021 for chronic diarrhea - Normal duodenal bulb and second portion of the duodenum. Biopsied. - Normal stomach. Biopsied. - Z-line irregular, 40 cm from the incisors. - Normal gastroesophageal junction and esophagus.  - The examined portion of the ileum was normal. - Normal mucosa in the entire examined colon. Biopsied. - Severe diverticulosis in the recto-sigmoid colon, in the sigmoid colon, in the descending colon and in the transverse colon. There was no evidence of diverticular bleeding. -  The distal rectum and anal verge are normal on retroflexion view.  DIAGNOSIS:  A.  DUODENUM; COLD BIOPSY:  - DUODENAL MUCOSA WITH INTACT VILLI.  - NEGATIVE FOR ACTIVE INFLAMMATION, INTRAEPITHELIAL LYMPHOCYTOSIS, AND  INFECTIOUS AGENTS.   B.  STOMACH; RANDOM COLD BIOPSY:  -  ANTRAL-TYPE MUCOSA WITH REACTIVE GASTROPATHY.  - NEGATIVE FOR H. PYLORI, INTESTINAL METAPLASIA, DYSPLASIA, AND  MALIGNANCY.   C.  COLON; RANDOM COLD BIOPSY:  - COLONIC MUCOSA WITH INTACT CRYPT ARCHITECTURE.  - LYMPHOID AGGREGATES WITH REACTIVE GERMINAL CENTERS.  - ONE EARLY APHTHOID LESION, SEE COMMENT.   EGD and colonoscopy 05/01/2017 - Normal examined duodenum. - Normal stomach. - Esophageal mucosal changes suspicious for short-segment Barrett's esophagus. Biopsied. - The examination was otherwise normal.  - Diverticulosis in the sigmoid colon, in the descending colon and in the transverse colon. - One 5 mm polyp at the recto-sigmoid colon, removed with a hot snare. Resected and retrieved. - The examination was otherwise normal.  DIAGNOSIS:  A. GE JUNCTION; COLD BIOPSY:  - COLUMNAR MUCOSA WITH INTESTINAL METAPLASIA.  - NEGATIVE FOR DYSPLASIA AND MALIGNANCY.   Note: If a characteristic lesion was identified endoscopically within  the GE junction, the histologic findings would fulfill criteria for  Barrett's esophagus.   B. COLON POLYP, RECTOSIGMOID; HOT SNARE:  - TUBULAR ADENOMA.  - NEGATIVE FOR HIGH-GRADE DYSPLASIA AND MALIGNANCY.  Past Medical History:  Diagnosis Date   Abnormal pap    Anemia    Anxiety    History of lupus 1987   Hypertension    Low vitamin D level    Renal insufficiency 1988   kidney biopsy 1988 and 1998- "my kidneys function at like 48%"    Past Surgical History:  Procedure Laterality Date   BONE MARROW BIOPSY  01/2021   BREAST BIOPSY Right 07/08/2017   BREAST EXCISIONAL BIOPSY Right 08/06/2017   BREAST EXCISIONAL BIOPSY Right 07/1999   BREAST LUMPECTOMY WITH RADIOACTIVE SEED LOCALIZATION Right 08/06/2017   Procedure: RIGHT BREAST LUMPECTOMY WITH RADIOACTIVE SEED LOCALIZATION ERAS PATHWAY;  Surgeon: Glenna Fellows, MD;  Location: [facility];  Service: General;  Laterality: Right;   BREAST LUMPECTOMY WITH RADIOACTIVE SEED  LOCALIZATION Right 08/19/2018   Procedure: RIGHT BREAST LUMPECTOMY WITH RADIOACTIVE SEED LOCALIZATION ERAS PATHWAY;  Surgeon: Glenna Fellows, MD;  Location: [facility] OR;  Service: General;  Laterality: Right;   BREAST SURGERY  07/1999   right breast benign fibroadenoma   COLONOSCOPY  2012   COLONOSCOPY WITH PROPOFOL N/A 05/01/2017   Procedure: COLONOSCOPY WITH PROPOFOL;  Surgeon: Kieth Brightly, MD;  Location: [facility] ENDOSCOPY;  Service: Endoscopy;  Laterality: N/A;   COLONOSCOPY WITH PROPOFOL N/A 12/18/2021   Procedure: COLONOSCOPY WITH PROPOFOL;  Surgeon: Toney Reil, MD;  Location: [facility] ENDOSCOPY;  Service: Gastroenterology;  Laterality: N/A;   CRYOTHERAPY     abnormal pap smear   ESOPHAGOGASTRODUODENOSCOPY (EGD) WITH PROPOFOL N/A 05/01/2017   Procedure: ESOPHAGOGASTRODUODENOSCOPY (EGD) WITH PROPOFOL;  Surgeon: Kieth Brightly, MD;  Location: [facility] ENDOSCOPY;  Service: Endoscopy;  Laterality: N/A;   ESOPHAGOGASTRODUODENOSCOPY (EGD) WITH PROPOFOL N/A 12/18/2021   Procedure: ESOPHAGOGASTRODUODENOSCOPY (EGD) WITH PROPOFOL;  Surgeon: Toney Reil, MD;  Location: [facility] ENDOSCOPY;  Service: Gastroenterology;  Laterality: N/A;   NASAL SEPTUM SURGERY  2007   TUBAL LIGATION  1990    Current Outpatient Medications:    aspirin EC 81 MG tablet, Take 81 mg by mouth daily. Swallow whole., Disp: , Rfl:    chlorthalidone (HYGROTON) 25 MG tablet, Take 12.5 mg by mouth  in the morning., Disp: , Rfl:    Cholecalciferol (VITAMIN D3 PO), Take 1,000 Units by mouth daily., Disp: , Rfl:    clonazePAM (KLONOPIN) 0.5 MG tablet, Take 1 tablet (0.5 mg total) by mouth daily as needed for anxiety., Disp: 20 tablet, Rfl: 0   cyanocobalamin 1000 MCG tablet, Take 1,000 mcg by mouth daily. Vitamin B12, Disp: , Rfl:    enalapril (VASOTEC) 20 MG tablet, Take 0.5 tablets (10 mg total) by mouth 2 (two) times daily., Disp: , Rfl:    Ferrous Sulfate (IRON) 325 (65 Fe) MG TABS, Take 1 tablet by mouth  daily., Disp: , Rfl:    fluticasone (FLONASE) 50 MCG/ACT nasal spray, Place 2 sprays into both nostrils daily., Disp: 16 g, Rfl: 6   hydroxychloroquine (PLAQUENIL) 200 MG tablet, Take 200 mg by mouth every other day., Disp: , Rfl:    lipase/protease/amylase (CREON) 36000 UNITS CPEP capsule, Take 2 capsules with the first bite of each meal and 1 capsule with the first bite of each snack, Disp: 240 capsule, Rfl: 2   meclizine (ANTIVERT) 12.5 MG tablet, Take 1 tablet (12.5 mg total) by mouth 3 (three) times daily as needed for dizziness., Disp: 30 tablet, Rfl: 0   pantoprazole (PROTONIX) 20 MG tablet, Take 1 tablet (20 mg total) by mouth daily., Disp: 30 tablet, Rfl: 1   traMADol (ULTRAM) 50 MG tablet, Take by mouth., Disp: , Rfl:    traZODone (DESYREL) 50 MG tablet, TAKE 1 TABLET BY MOUTH EVERY DAY AT BEDTIME AS NEEDED FOR SLEEP, Disp: 90 tablet, Rfl: 0    Family History  Problem Relation Age of Onset   Cancer Father        oral cancer   Cancer Maternal Grandmother        ovarian cancer   Hypertension Maternal Grandmother    Diabetes Maternal Grandfather    Diabetes Paternal Aunt    Breast cancer Neg Hx      Social History   Tobacco Use   Smoking status: Never   Smokeless tobacco: Never  Vaping Use   Vaping status: Never Used  Substance Use Topics   Alcohol use: Yes    Alcohol/week: 4.0 - 6.0 standard drinks of alcohol    Types: 4 - 6 Standard drinks or equivalent per week    Comment: few beers on friday and saturday   Drug use: Yes    Types: Marijuana    Comment: few times a month     Allergies as of 06/12/2023 - Review Complete 06/12/2023  Allergen Reaction Noted   Elemental sulfur Hives 02/04/2013   Sulfa antibiotics Hives, Itching, and Rash 03/11/2013   Beef-derived products  03/28/2022   Lambs quarters  03/28/2022   Milk-related compounds  03/28/2022    Review of Systems:    All systems reviewed and negative except where noted in HPI.   Physical Exam:  BP  128/70   Pulse 86   Temp 98.3 F (36.8 C) (Oral)   Wt 189 lb 9.6 oz (86 kg)   LMP 08/22/2008   BMI 30.60 kg/m  Patient's last menstrual period was 08/22/2008.  General:   Alert,  Well-developed, well-nourished, pleasant and cooperative in NAD Head:  Normocephalic and atraumatic. Eyes:  Sclera clear, no icterus.   Conjunctiva pink. Ears:  Normal auditory acuity. Nose:  No deformity, discharge, or lesions. Mouth:  No deformity or lesions,oropharynx pink & moist. Neck:  Supple; no masses or thyromegaly. Lungs:  Respirations even and unlabored.  Clear  throughout to auscultation.   No wheezes, crackles, or rhonchi. No acute distress. Heart:  Regular rate and rhythm; no murmurs, clicks, rubs, or gallops. Abdomen:  Normal bowel sounds. Soft, non-tender and non-distended without masses, hepatosplenomegaly or hernias noted.  No guarding or rebound tenderness.   Rectal: Not performed Msk:  Symmetrical without gross deformities. Good, equal movement & strength bilaterally. Pulses:  Normal pulses noted. Extremities:  No clubbing or edema.  No cyanosis. Neurologic:  Alert and oriented x3;  grossly normal neurologically. Skin:  Intact without significant lesions or rashes. No jaundice. Psych:  Alert and cooperative. Normal mood and affect.  Imaging Studies: Reviewed  Assessment and Plan:   [name] is a 67 y.o. female with history of CKD, lupus, hypertension, mild iron deficiency anemia is seen in for chronic symptoms of postprandial urgency, diarrhea and abdominal bloating, found to have alpha gal as well as intolerance to milk, moderate pancreatic insufficiency.  Patient reports symptomatic improvement with avoidance of beef, lamb as well as milk.  However, she is taking Creon which allowed her to reintroduce these foods without much GI symptoms.  Moderate to severe exocrine pancreatic insufficiency Discussed with patient regarding long-term management of EPI, etiology.  Primary  pancreatic pathology has been ruled out based on the CT scan Normal serum ferritin and alpha-1 antitrypsin levels, CFTR negative Advised patient to continue Creon 36 K 1-2 capsules with each meal 1-2 with snack Reiterated regarding low-carb, low-fat, high-protein diet.  Reiterated regarding consumption of lean meat only  Burping and belching, secondary to reflux Okay to take Protonix 20 mg daily for 2 to 4 weeks when she has symptoms Discussed about Pepcid as an alternative to Protonix.  She prefers Protonix prescription, sent Reiterated on antireflux lifestyle Advised her to cut down on sweet tea, as well as cut back on coffee to 1 cup a day   Follow up as needed.   Arlyss Repress, MD

## 2023-06-18 DIAGNOSIS — M329 Systemic lupus erythematosus, unspecified: Secondary | ICD-10-CM | POA: Diagnosis not present

## 2023-06-18 DIAGNOSIS — D2372 Other benign neoplasm of skin of left lower limb, including hip: Secondary | ICD-10-CM | POA: Diagnosis not present

## 2023-06-18 DIAGNOSIS — M79672 Pain in left foot: Secondary | ICD-10-CM | POA: Diagnosis not present

## 2023-07-03 DIAGNOSIS — D485 Neoplasm of uncertain behavior of skin: Secondary | ICD-10-CM | POA: Diagnosis not present

## 2023-07-03 DIAGNOSIS — D225 Melanocytic nevi of trunk: Secondary | ICD-10-CM | POA: Diagnosis not present

## 2023-07-03 DIAGNOSIS — L821 Other seborrheic keratosis: Secondary | ICD-10-CM | POA: Diagnosis not present

## 2023-07-03 DIAGNOSIS — R208 Other disturbances of skin sensation: Secondary | ICD-10-CM | POA: Diagnosis not present

## 2023-07-03 DIAGNOSIS — D2272 Melanocytic nevi of left lower limb, including hip: Secondary | ICD-10-CM | POA: Diagnosis not present

## 2023-07-05 ENCOUNTER — Other Ambulatory Visit: Payer: Self-pay | Admitting: Gastroenterology

## 2023-07-05 DIAGNOSIS — K219 Gastro-esophageal reflux disease without esophagitis: Secondary | ICD-10-CM

## 2023-07-15 ENCOUNTER — Ambulatory Visit: Payer: Medicare Other

## 2023-07-15 ENCOUNTER — Ambulatory Visit
Admission: RE | Admit: 2023-07-15 | Discharge: 2023-07-15 | Disposition: A | Discharge status: Home / Self Care | Payer: Medicare Other | Source: Ambulatory Visit | Attending: Obstetrics and Gynecology | Admitting: Obstetrics and Gynecology

## 2023-07-15 DIAGNOSIS — Z1231 Encounter for screening mammogram for malignant neoplasm of breast: Secondary | ICD-10-CM | POA: Diagnosis not present

## 2023-08-14 ENCOUNTER — Other Ambulatory Visit: Payer: Self-pay | Admitting: Gastroenterology

## 2023-08-14 DIAGNOSIS — K219 Gastro-esophageal reflux disease without esophagitis: Secondary | ICD-10-CM

## 2023-08-14 NOTE — Telephone Encounter (Signed)
Last office visit plan: 05/2023 Burping and belching, secondary to reflux Okay to take Protonix 20 mg daily for 2 to 4 weeks when she has symptoms Discussed about Pepcid as an alternative to Protonix.  She prefers Protonix prescription, sent Reiterated on antireflux lifestyle Advised her to cut down on sweet tea, as well as cut back on coffee to 1 cup a day

## 2023-08-26 ENCOUNTER — Telehealth: Payer: Self-pay

## 2023-08-26 NOTE — Telephone Encounter (Signed)
Patient assistance for creon is expiring 10/22/2023 and we need to reapply for 2025. Filled out the application just need patient to come and sign form. Tried to call patient twice but unable to leave a message because voicemail is full

## 2023-08-27 DIAGNOSIS — M65321 Trigger finger, right index finger: Secondary | ICD-10-CM | POA: Diagnosis not present

## 2023-08-27 NOTE — Telephone Encounter (Signed)
Patient will come by our office on 08/28/2023 to sign the application forms

## 2023-08-28 DIAGNOSIS — N390 Urinary tract infection, site not specified: Secondary | ICD-10-CM | POA: Diagnosis not present

## 2023-08-28 DIAGNOSIS — R300 Dysuria: Secondary | ICD-10-CM | POA: Diagnosis not present

## 2023-08-28 NOTE — Telephone Encounter (Signed)
Patient came and sign paper work and we faxed paper work to the patient assistance for creon

## 2023-09-05 NOTE — Telephone Encounter (Signed)
Received a faxe from [place] patient assistance for creon that they are missing information from the patient. They have requested information directly from th patient and once they this information they will continue the process.

## 2023-09-05 NOTE — Telephone Encounter (Signed)
Called patient and she states she will call them now to find out what they are missing.

## 2023-09-09 NOTE — Telephone Encounter (Signed)
Patient states she will bring this by our office tomorrow so we can fax it in for her.

## 2023-09-09 NOTE — Telephone Encounter (Signed)
Called the patient assistance company and they are needed how many people live in her house hold and the household income to complete her application

## 2023-09-16 NOTE — Telephone Encounter (Signed)
Patient brought by her tax information and faxed the information to [facility] patient assistance

## 2023-09-18 NOTE — Telephone Encounter (Signed)
They received the application missing information and they should have answer by Tuesday.

## 2023-09-25 NOTE — Telephone Encounter (Signed)
The [employer] patient assistance company has placed the application on hold because she has to try to get approved for Extra help with medicare first before they will approve her. She will apply at [email or web address] or by phone [phone] or go to your local social security office. If she gets denied for this then we can submit the denial letter and they will reconsider her. Patient verbalized understanding and states she is at work and can not write anything down. She said to send it to her mychart

## 2023-09-25 NOTE — Telephone Encounter (Signed)
Called and they called the processing team and they should have answer in 2 business days if she is approved or not

## 2023-10-01 DIAGNOSIS — N183 Chronic kidney disease, stage 3 unspecified: Secondary | ICD-10-CM | POA: Diagnosis not present

## 2023-10-03 ENCOUNTER — Encounter: Payer: Self-pay | Admitting: Nurse Practitioner

## 2023-10-03 ENCOUNTER — Inpatient Hospital Stay: Payer: Medicare Other

## 2023-10-03 ENCOUNTER — Ambulatory Visit: Payer: Medicare Other | Admitting: Nurse Practitioner

## 2023-10-03 VITALS — BP 135/74 | HR 74 | Temp 97.8°F | Ht 66.0 in | Wt 188.8 lb

## 2023-10-03 DIAGNOSIS — Z136 Encounter for screening for cardiovascular disorders: Secondary | ICD-10-CM | POA: Diagnosis not present

## 2023-10-03 DIAGNOSIS — M3214 Glomerular disease in systemic lupus erythematosus: Secondary | ICD-10-CM | POA: Diagnosis not present

## 2023-10-03 DIAGNOSIS — D519 Vitamin B12 deficiency anemia, unspecified: Secondary | ICD-10-CM | POA: Diagnosis not present

## 2023-10-03 DIAGNOSIS — N1831 Chronic kidney disease, stage 3a: Secondary | ICD-10-CM

## 2023-10-03 DIAGNOSIS — Z23 Encounter for immunization: Secondary | ICD-10-CM

## 2023-10-03 DIAGNOSIS — E559 Vitamin D deficiency, unspecified: Secondary | ICD-10-CM | POA: Diagnosis not present

## 2023-10-03 DIAGNOSIS — Z0000 Encounter for general adult medical examination without abnormal findings: Secondary | ICD-10-CM

## 2023-10-03 DIAGNOSIS — F329 Major depressive disorder, single episode, unspecified: Secondary | ICD-10-CM

## 2023-10-03 LAB — MICROSCOPIC EXAMINATION
Bacteria, UA: NONE SEEN
RBC, Urine: NONE SEEN /[HPF] (ref 0–2)
WBC, UA: NONE SEEN /[HPF] (ref 0–5)

## 2023-10-03 LAB — URINALYSIS, ROUTINE W REFLEX MICROSCOPIC
Bilirubin, UA: NEGATIVE
Glucose, UA: NEGATIVE
Ketones, UA: NEGATIVE
Leukocytes,UA: NEGATIVE
Nitrite, UA: NEGATIVE
RBC, UA: NEGATIVE
Specific Gravity, UA: >1.03 — ABNORMAL HIGH (ref 1.005–1.030)
Urobilinogen, Ur: 0.2 mg/dL (ref 0.2–1.0)
pH, UA: 5.5 (ref 5.0–7.5)

## 2023-10-03 MED ORDER — ENALAPRIL MALEATE 20 MG PO TABS: 10.0000 mg | ORAL_TABLET | Freq: Two times a day (BID) | ORAL | 1 refills | Status: DC

## 2023-10-03 NOTE — Assessment & Plan Note (Signed)
Chronic.  Controlled.  Continue with current medication regimen.  Labs ordered today.  Return to clinic in 6 months for reevaluation.  Call sooner if concerns arise.  ? ?

## 2023-10-03 NOTE — Assessment & Plan Note (Signed)
Chronic, ongoing.  Followed by rheumatology and hematology.  Recent labs on discharge showed lower H/H -- will plan recheck today and if HGB.  Patient will likely need to return to Hematology.  Currently no symptoms of anemia, other then fatigue.  Is on Hydroxychloroquine.  Up to date on eye exams.  Continue to follow up with Rheumatology.

## 2023-10-03 NOTE — Assessment & Plan Note (Signed)
Chronic, ongoing.  Patient hasn't been taking Enalapril regularly.  Discussed importance of medication with patient during visit.  Also discussed importance of keeping blood pressure well controlled.  Followed by [facility] nephrology.  Recent note reviewed.  Labs ordered at visit today.

## 2023-10-03 NOTE — Assessment & Plan Note (Signed)
Labs ordered at visit today.  Will make recommendations based on lab results.   

## 2023-10-03 NOTE — Assessment & Plan Note (Signed)
Chronic.  Labs ordered today.  Cancelled recent appointment with Hematology.  Labs ordered at visit today.  Will await results and make recommendations based on results.

## 2023-10-03 NOTE — Progress Notes (Signed)
BP 135/74 (BP Location: Left Arm, Patient Position: Sitting, Cuff Size: Large)   Pulse 74   Temp 97.8 F (36.6 C) (Oral)   Ht 5\' 6"  (1.676 m)   Wt 188 lb 12.8 oz (85.6 kg)   LMP 08/22/2008   SpO2 96%   BMI 30.47 kg/m    Subjective:    Patient ID: [name], female    DOB: [birth year], 67 y.o.   MRN: [id number]  HPI: [name] is a 67 y.o. female presenting on 10/03/2023 for comprehensive medical examination. Current medical complaints include:none  She currently lives with: Menopausal Symptoms: no  ANEMIA Stopped taking the Iron, Vitamin D and b12 due to being on antibiotics. Has restarting B12 and Vitamin D. Anemia status: controlled Etiology of anemia: B12 deficiency Duration of anemia treatment:  Compliance with treatment: excellent compliance Iron supplementation side effects: no Severity of anemia: moderate Fatigue: no Decreased exercise tolerance: no  Dyspnea on exertion: no Palpitations: no Bleeding: no Pica: no  LUPUS Patient is followed by Rheumatology.  On Plaquenil.  Is up to date on her eye exams.   HYPERTENSION with Chronic Kidney Disease Not taking Enalapril regularly.   Hypertension status: controlled  Satisfied with current treatment? yes Duration of hypertension: years BP monitoring frequency:  not checking BP range:  BP medication side effects:  no Medication compliance: excellent compliance Previous BP meds: enalapril  and chlorthalidone Aspirin: no Recurrent headaches: no Visual changes: no Palpitations: no Dyspnea: no Chest pain: no Lower extremity edema: no Dizzy/lightheaded: no     Depression Screen done today and results listed below:     04/02/2023    2:25 PM 02/19/2023    1:59 PM 08/15/2022    3:27 PM 07/31/2022    3:39 PM 03/01/2022    1:59 PM  Depression screen PHQ 2/9  Decreased Interest 1 1 2 1 1   Down, Depressed, Hopeless 1 1 1 1 1   PHQ - 2 Score 2 2 3 2 2   Altered sleeping 1 1 2 1 2   Tired,  decreased energy 2 1 2 1 2   Change in appetite 1  1 2 1   Feeling bad or failure about yourself  2  1 1 1   Trouble concentrating 2  1 0 0  Moving slowly or fidgety/restless 1  1 0 1  Suicidal thoughts 0  0  0  PHQ-9 Score 11 4 11 7 9   Difficult doing work/chores  Not difficult at all Somewhat difficult Somewhat difficult Not difficult at all    The patient does not have a history of falls. I did complete a risk assessment for falls. A plan of care for falls was documented.   Past Medical History:  Past Medical History:  Diagnosis Date   Abnormal pap    Anemia    Anxiety    History of lupus 1987   Hypertension    Low vitamin D level    Renal insufficiency 1988   kidney biopsy 1988 and 1998- "my kidneys function at like 48%"    Surgical History:  Past Surgical History:  Procedure Laterality Date   BONE MARROW BIOPSY  01/2021   BREAST BIOPSY Right 07/08/2017   BREAST EXCISIONAL BIOPSY Right 08/06/2017   BREAST EXCISIONAL BIOPSY Right 07/1999   BREAST LUMPECTOMY WITH RADIOACTIVE SEED LOCALIZATION Right 08/06/2017   Procedure: RIGHT BREAST LUMPECTOMY WITH RADIOACTIVE SEED LOCALIZATION ERAS PATHWAY;  Surgeon: Glenna Fellows, MD;  Location: [facility];  Service: General;  Laterality: Right;  BREAST LUMPECTOMY WITH RADIOACTIVE SEED LOCALIZATION Right 08/19/2018   Procedure: RIGHT BREAST LUMPECTOMY WITH RADIOACTIVE SEED LOCALIZATION ERAS PATHWAY;  Surgeon: Glenna Fellows, MD;  Location: [facility] OR;  Service: General;  Laterality: Right;   BREAST SURGERY  07/1999   right breast benign fibroadenoma   COLONOSCOPY  2012   COLONOSCOPY WITH PROPOFOL N/A 05/01/2017   Procedure: COLONOSCOPY WITH PROPOFOL;  Surgeon: Kieth Brightly, MD;  Location: [facility] ENDOSCOPY;  Service: Endoscopy;  Laterality: N/A;   COLONOSCOPY WITH PROPOFOL N/A 12/18/2021   Procedure: COLONOSCOPY WITH PROPOFOL;  Surgeon: Toney Reil, MD;  Location: [facility] ENDOSCOPY;  Service:  Gastroenterology;  Laterality: N/A;   CRYOTHERAPY     abnormal pap smear   ESOPHAGOGASTRODUODENOSCOPY (EGD) WITH PROPOFOL N/A 05/01/2017   Procedure: ESOPHAGOGASTRODUODENOSCOPY (EGD) WITH PROPOFOL;  Surgeon: Kieth Brightly, MD;  Location: [facility] ENDOSCOPY;  Service: Endoscopy;  Laterality: N/A;   ESOPHAGOGASTRODUODENOSCOPY (EGD) WITH PROPOFOL N/A 12/18/2021   Procedure: ESOPHAGOGASTRODUODENOSCOPY (EGD) WITH PROPOFOL;  Surgeon: Toney Reil, MD;  Location: [facility] ENDOSCOPY;  Service: Gastroenterology;  Laterality: N/A;   NASAL SEPTUM SURGERY  2007   TUBAL LIGATION  1990    Medications:  Current Outpatient Medications on File Prior to Visit  Medication Sig   aspirin EC 81 MG tablet Take 81 mg by mouth daily. Swallow whole.   chlorthalidone (HYGROTON) 25 MG tablet Take 12.5 mg by mouth in the morning.   clonazePAM (KLONOPIN) 0.5 MG tablet Take 1 tablet (0.5 mg total) by mouth daily as needed for anxiety.   fluticasone (FLONASE) 50 MCG/ACT nasal spray Place 2 sprays into both nostrils daily.   hydroxychloroquine (PLAQUENIL) 200 MG tablet Take 200 mg by mouth every other day.   lipase/protease/amylase (CREON) 36000 UNITS CPEP capsule Take 2 capsules with the first bite of each meal and 1 capsule with the first bite of each snack   meclizine (ANTIVERT) 12.5 MG tablet Take 1 tablet (12.5 mg total) by mouth 3 (three) times daily as needed for dizziness.   pantoprazole (PROTONIX) 20 MG tablet TAKE 1 TABLET BY MOUTH EVERY DAY   traMADol (ULTRAM) 50 MG tablet Take by mouth.   traZODone (DESYREL) 50 MG tablet TAKE 1 TABLET BY MOUTH EVERY DAY AT BEDTIME AS NEEDED FOR SLEEP   Cholecalciferol (VITAMIN D3 PO) Take 1,000 Units by mouth daily. (Patient not taking: Reported on 10/03/2023)   cyanocobalamin 1000 MCG tablet Take 1,000 mcg by mouth daily. Vitamin B12 (Patient not taking: Reported on 10/03/2023)   Ferrous Sulfate (IRON) 325 (65 Fe) MG TABS Take 1 tablet by mouth daily. (Patient not  taking: Reported on 10/03/2023)   No current facility-administered medications on file prior to visit.    Allergies:  Allergies  Allergen Reactions   Elemental Sulfur Hives   Sulfa Antibiotics Hives, Itching and Rash   Beef-Derived Drug Products     Stomach issues   Lambs Marzella Schlein meat-Stomach issues    Milk-Related Compounds     Stomach issues    Social History:  Social History   Socioeconomic History   Marital status: Widowed    Spouse name: Not on file   Number of children: Not on file   Years of education: Not on file   Highest education level: Not on file  Occupational History   Not on file  Tobacco Use   Smoking status: Never   Smokeless tobacco: Never  Vaping Use   Vaping status: Never Used  Substance and Sexual Activity  Alcohol use: Yes    Alcohol/week: 4.0 - 6.0 standard drinks of alcohol    Types: 4 - 6 Standard drinks or equivalent per week    Comment: few beers on friday and saturday   Drug use: Yes    Types: Marijuana    Comment: few times a month    Sexual activity: Yes    Partners: Male    Birth control/protection: Surgical, Post-menopausal    Comment: BTL, less than 5, after 16, no abnormal pap  Other Topics Concern   Not on file  Social History Narrative   Husband died 2016/12/19 from lung cancer at age 5 yrs old.     Social Drivers of Corporate investment banker Strain: Low Risk  (02/19/2023)   Overall Financial Resource Strain (CARDIA)    Difficulty of Paying Living Expenses: Not hard at all  Food Insecurity: No Food Insecurity (02/19/2023)   Hunger Vital Sign    Worried About Running Out of Food in the Last Year: Never true    Ran Out of Food in the Last Year: Never true  Transportation Needs: No Transportation Needs (02/19/2023)   PRAPARE - Administrator, Civil Service (Medical): No    Lack of Transportation (Non-Medical): No  Physical Activity: Insufficiently Active (02/19/2023)   Exercise Vital Sign    Days of  Exercise per Week: 5 days    Minutes of Exercise per Session: 20 min  Stress: No Stress Concern Present (02/19/2023)   [employer] of Occupational Health - Occupational Stress Questionnaire    Feeling of Stress : Only a little  Social Connections: Moderately Integrated (02/19/2023)   Social Connection and Isolation Panel [NHANES]    Frequency of Communication with Friends and Family: More than three times a week    Frequency of Social Gatherings with Friends and Family: Once a week    Attends Religious Services: More than 4 times per year    Active Member of [employer] or Organizations: Yes    Attends Banker Meetings: More than 4 times per year    Marital Status: Widowed  Intimate Partner Violence: Not At Risk (02/19/2023)   Humiliation, Afraid, Rape, and Kick questionnaire    Fear of Current or Ex-Partner: No    Emotionally Abused: No    Physically Abused: No    Sexually Abused: No   Social History   Tobacco Use  Smoking Status Never  Smokeless Tobacco Never   Social History   Substance and Sexual Activity  Alcohol Use Yes   Alcohol/week: 4.0 - 6.0 standard drinks of alcohol   Types: 4 - 6 Standard drinks or equivalent per week   Comment: few beers on friday and saturday    Family History:  Family History  Problem Relation Age of Onset   Cancer Father        oral cancer   Cancer Maternal Grandmother        ovarian cancer   Hypertension Maternal Grandmother    Diabetes Maternal Grandfather    Diabetes Paternal Aunt    Breast cancer Neg Hx     Past medical history, surgical history, medications, allergies, family history and social history reviewed with patient today and changes made to appropriate areas of the chart.   Review of Systems  Eyes:  Negative for blurred vision and double vision.  Respiratory:  Negative for shortness of breath.   Cardiovascular:  Negative for chest pain, palpitations and leg swelling.  Neurological:  Negative for  dizziness  and headaches.   All other ROS negative except what is listed above and in the HPI.      Objective:    BP 135/74 (BP Location: Left Arm, Patient Position: Sitting, Cuff Size: Large)   Pulse 74   Temp 97.8 F (36.6 C) (Oral)   Ht 5\' 6"  (1.676 m)   Wt 188 lb 12.8 oz (85.6 kg)   LMP 08/22/2008   SpO2 96%   BMI 30.47 kg/m   Wt Readings from Last 3 Encounters:  10/03/23 188 lb 12.8 oz (85.6 kg)  06/12/23 189 lb 9.6 oz (86 kg)  05/15/23 188 lb (85.3 kg)    Physical Exam Vitals and nursing note reviewed.  Constitutional:      General: She is awake. She is not in acute distress.    Appearance: Normal appearance. She is well-developed. She is not ill-appearing.  HENT:     Head: Normocephalic and atraumatic.     Right Ear: Hearing, tympanic membrane, ear canal and external ear normal. No drainage.     Left Ear: Hearing, tympanic membrane, ear canal and external ear normal. No drainage.     Nose: Nose normal.     Right Sinus: No maxillary sinus tenderness or frontal sinus tenderness.     Left Sinus: No maxillary sinus tenderness or frontal sinus tenderness.     Mouth/Throat:     Mouth: Mucous membranes are moist.     Pharynx: Oropharynx is clear. Uvula midline. No pharyngeal swelling, oropharyngeal exudate or posterior oropharyngeal erythema.  Eyes:     General: Lids are normal.        Right eye: No discharge.        Left eye: No discharge.     Extraocular Movements: Extraocular movements intact.     Conjunctiva/sclera: Conjunctivae normal.     Pupils: Pupils are equal, round, and reactive to light.     Visual Fields: Right eye visual fields normal and left eye visual fields normal.  Neck:     Thyroid: No thyromegaly.     Vascular: No carotid bruit.     Trachea: Trachea normal.  Cardiovascular:     Rate and Rhythm: Normal rate and regular rhythm.     Heart sounds: Normal heart sounds. No murmur heard.    No gallop.  Pulmonary:     Effort: Pulmonary effort is normal. No  accessory muscle usage or respiratory distress.     Breath sounds: Normal breath sounds.  Chest:  Breasts:    Right: Normal.     Left: Normal.  Abdominal:     General: Bowel sounds are normal.     Palpations: Abdomen is soft. There is no hepatomegaly or splenomegaly.     Tenderness: There is no abdominal tenderness.  Musculoskeletal:        General: Normal range of motion.     Cervical back: Normal range of motion and neck supple.     Right lower leg: No edema.     Left lower leg: No edema.  Lymphadenopathy:     Head:     Right side of head: No submental, submandibular, tonsillar, preauricular or posterior auricular adenopathy.     Left side of head: No submental, submandibular, tonsillar, preauricular or posterior auricular adenopathy.     Cervical: No cervical adenopathy.     Upper Body:     Right upper body: No supraclavicular, axillary or pectoral adenopathy.     Left upper body: No supraclavicular, axillary or pectoral adenopathy.  Skin:    General: Skin is warm and dry.     Capillary Refill: Capillary refill takes less than 2 seconds.     Findings: No rash.  Neurological:     Mental Status: She is alert and oriented to person, place, and time.     Gait: Gait is intact.  Psychiatric:        Attention and Perception: Attention normal.        Mood and Affect: Mood normal.        Speech: Speech normal.        Behavior: Behavior normal. Behavior is cooperative.        Thought Content: Thought content normal.        Judgment: Judgment normal.     Results for orders placed or performed in visit on 05/15/23  Cytology - PAP( [place])   Collection Time: 05/15/23 12:26 PM  Result Value Ref Range   High risk HPV Negative    Neisseria Gonorrhea Negative    Chlamydia Negative    Trichomonas Negative    Adequacy      Satisfactory for evaluation; transformation zone component PRESENT.   Diagnosis      - Negative for intraepithelial lesion or malignancy (NILM)   Comment  Normal Reference Range HPV - Negative    Comment Normal Reference Range Trichomonas - Negative    Comment Normal Reference [place] Chlamydia - Negative    Comment      Normal Reference Range Neisseria Gonorrhea - Negative      Assessment & Plan:   Problem List Items Addressed This Visit       Genitourinary   Stage 3a chronic kidney disease (CKD) (HCC)   Chronic, ongoing.  Patient hasn't been taking Enalapril regularly.  Discussed importance of medication with patient during visit.  Also discussed importance of keeping blood pressure well controlled.  Followed by [facility] nephrology.  Recent note reviewed.  Labs ordered at visit today.        Stage III lupus nephritis (WHO) (HCC)   Chronic, ongoing.  Followed by [facility] nephrology, continue this collaboration.  Recent note reviewed and medication change.  Check CMP today.        Other   Depression   Chronic.  Controlled.  Continue with current medication regimen.  Labs ordered today.  Return to clinic in 6 months for reevaluation.  Call sooner if concerns arise.        Systemic lupus erythematosus (HCC)   Chronic, ongoing.  Followed by rheumatology and hematology.  Recent labs on discharge showed lower H/H -- will plan recheck today and if HGB.  Patient will likely need to return to Hematology.  Currently no symptoms of anemia, other then fatigue.  Is on Hydroxychloroquine.  Up to date on eye exams.  Continue to follow up with Rheumatology.      Vitamin D deficiency   Labs ordered at visit today.  Will make recommendations based on lab results.        Relevant Orders   Vitamin D (25 hydroxy)   B12 deficiency anemia   Chronic.  Labs ordered today.  Cancelled recent appointment with Hematology.  Labs ordered at visit today.  Will await results and make recommendations based on results.       Relevant Orders   B12   Iron, TIBC and Ferritin Panel   Lactate Dehydrogenase (LDH)   Other Visit Diagnoses       Need for influenza  vaccination    -  Primary   Relevant Orders   Flu Vaccine Trivalent High Dose (Fluad) (Completed)     Annual physical exam       Relevant Orders   CBC with Differential/Platelet   Comprehensive metabolic panel   Lipid panel   TSH   Urinalysis, Routine w reflex microscopic     Screening for ischemic heart disease       Relevant Orders   Lipid panel        Follow up plan: No follow-ups on file.   LABORATORY TESTING:  - Pap smear: not applicable  IMMUNIZATIONS:   - Tdap: Tetanus vaccination status reviewed: last tetanus booster within 10 years. - Influenza: Given today - Pneumovax: Needs up dated - Prevnar: Not applicable - COVID: Not applicable - HPV: Not applicable - Shingrix vaccine:  Discussed at visit today  SCREENING: -Mammogram: Up to date  - Colonoscopy: Up to date  - Bone Density: Up to date  -Hearing Test: Not applicable  -Spirometry: Not applicable   PATIENT COUNSELING:   Advised to take 1 mg of folate supplement per day if capable of pregnancy.   Sexuality: Discussed sexually transmitted diseases, partner selection, use of condoms, avoidance of unintended pregnancy  and contraceptive alternatives.   Advised to avoid cigarette smoking.  I discussed with the patient that most people either abstain from alcohol or drink within safe limits (<=14/week and <=4 drinks/occasion for males, <=7/weeks and <= 3 drinks/occasion for females) and that the risk for alcohol disorders and other health effects rises proportionally with the number of drinks per week and how often a drinker exceeds daily limits.  Discussed cessation/primary prevention of drug use and availability of treatment for abuse.   Diet: Encouraged to adjust caloric intake to maintain  or achieve ideal body weight, to reduce intake of dietary saturated fat and total fat, to limit sodium intake by avoiding high sodium foods and not adding table salt, and to maintain adequate dietary potassium and calcium  preferably from fresh fruits, vegetables, and low-fat dairy products.    stressed the importance of regular exercise  Injury prevention: Discussed safety belts, safety helmets, smoke detector, smoking near bedding or upholstery.   Dental health: Discussed importance of regular tooth brushing, flossing, and dental visits.    NEXT PREVENTATIVE PHYSICAL DUE IN 1 YEAR. No follow-ups on file.

## 2023-10-03 NOTE — Assessment & Plan Note (Signed)
Chronic, ongoing.  Followed by [facility] nephrology, continue this collaboration.  Recent note reviewed and medication change.  Check CMP today.

## 2023-10-04 LAB — COMPREHENSIVE METABOLIC PANEL
ALT: 9 [IU]/L (ref 0–32)
AST: 21 [IU]/L (ref 0–40)
Albumin: 4.3 g/dL (ref 3.9–4.9)
Alkaline Phosphatase: 91 [IU]/L (ref 44–121)
BUN/Creatinine Ratio: 22 (ref 12–28)
BUN: 27 mg/dL (ref 8–27)
Bilirubin Total: 0.2 mg/dL (ref 0.0–1.2)
CO2: 23 mmol/L (ref 20–29)
Calcium: 9.3 mg/dL (ref 8.7–10.3)
Chloride: 106 mmol/L (ref 96–106)
Creatinine, Ser: 1.24 mg/dL — ABNORMAL HIGH (ref 0.57–1.00)
Globulin, Total: 2.4 g/dL (ref 1.5–4.5)
Glucose: 85 mg/dL (ref 70–99)
Potassium: 5 mmol/L (ref 3.5–5.2)
Sodium: 142 mmol/L (ref 134–144)
Total Protein: 6.7 g/dL (ref 6.0–8.5)
eGFR: 47 mL/min/{1.73_m2} — ABNORMAL LOW (ref >59)

## 2023-10-04 LAB — CBC WITH DIFFERENTIAL/PLATELET
Basophils Absolute: 0.1 10*3/uL (ref 0.0–0.2)
Basos: 1 %
EOS (ABSOLUTE): 0.2 10*3/uL (ref 0.0–0.4)
Eos: 2 %
Hematocrit: 36.7 % (ref 34.0–46.6)
Hemoglobin: 11.9 g/dL (ref 11.1–15.9)
Immature Grans (Abs): 0 10*3/uL (ref 0.0–0.1)
Immature Granulocytes: 0 %
Lymphocytes Absolute: 1.9 10*3/uL (ref 0.7–3.1)
Lymphs: 20 %
MCH: 32.7 pg (ref 26.6–33.0)
MCHC: 32.4 g/dL (ref 31.5–35.7)
MCV: 101 fL — ABNORMAL HIGH (ref 79–97)
Monocytes Absolute: 1 10*3/uL — ABNORMAL HIGH (ref 0.1–0.9)
Monocytes: 11 %
Neutrophils Absolute: 6.2 10*3/uL (ref 1.4–7.0)
Neutrophils: 66 %
Platelets: 521 10*3/uL — ABNORMAL HIGH (ref 150–450)
RBC: 3.64 x10E6/uL — ABNORMAL LOW (ref 3.77–5.28)
RDW: 12.1 % (ref 11.7–15.4)
WBC: 9.5 10*3/uL (ref 3.4–10.8)

## 2023-10-04 LAB — IRON,TIBC AND FERRITIN PANEL
Ferritin: 192 ng/mL — ABNORMAL HIGH (ref 15–150)
Iron Saturation: 30 % (ref 15–55)
Iron: 84 ug/dL (ref 27–139)
Total Iron Binding Capacity: 277 ug/dL (ref 250–450)
UIBC: 193 ug/dL (ref 118–369)

## 2023-10-04 LAB — LIPID PANEL
Chol/HDL Ratio: 2.9 {ratio} (ref 0.0–4.4)
Cholesterol, Total: 179 mg/dL (ref 100–199)
HDL: 62 mg/dL (ref >39)
LDL Chol Calc (NIH): 105 mg/dL — ABNORMAL HIGH (ref 0–99)
Triglycerides: 60 mg/dL (ref 0–149)
VLDL Cholesterol Cal: 12 mg/dL (ref 5–40)

## 2023-10-04 LAB — VITAMIN B12: Vitamin B-12: 522 pg/mL (ref 232–1245)

## 2023-10-04 LAB — LACTATE DEHYDROGENASE: LDH: 205 [IU]/L (ref 119–226)

## 2023-10-04 LAB — TSH: TSH: 1.61 u[IU]/mL (ref 0.450–4.500)

## 2023-10-04 LAB — VITAMIN D 25 HYDROXY (VIT D DEFICIENCY, FRACTURES): Vit D, 25-Hydroxy: 28.5 ng/mL — ABNORMAL LOW (ref 30.0–100.0)

## 2023-10-08 ENCOUNTER — Ambulatory Visit: Payer: Medicare Other

## 2023-10-08 ENCOUNTER — Ambulatory Visit: Payer: Medicare Other | Admitting: Internal Medicine

## 2023-10-08 DIAGNOSIS — N183 Chronic kidney disease, stage 3 unspecified: Secondary | ICD-10-CM | POA: Diagnosis not present

## 2023-10-08 DIAGNOSIS — I10 Essential (primary) hypertension: Secondary | ICD-10-CM | POA: Diagnosis not present

## 2023-10-21 ENCOUNTER — Other Ambulatory Visit: Payer: Self-pay | Admitting: Nurse Practitioner

## 2023-10-21 NOTE — Telephone Encounter (Signed)
Medication Refill -  Most Recent Primary Care Visit:  Provider: Larae Grooms  Department: [facility]  Visit Type: PHYSICAL  Date: 10/03/2023  Medication: traMADol (ULTRAM) 50 MG tablet   Has the patient contacted their pharmacy? Yes   Is this the correct pharmacy for this prescription? Yes If no, delete pharmacy and type the correct one.  This is the patient's preferred pharmacy:  [id number] - [place], [place] - [address] [place] [address] Phone: [phone] Fax: [phone]   Has the prescription been filled recently? No  Is the patient out of the medication? No  Has the patient been seen for an appointment in the last year OR does the patient have an upcoming appointment? Yes  Can we respond through MyChart? Yes  Please assist patient further

## 2023-10-25 NOTE — Telephone Encounter (Signed)
 Requested medications are due for refill today.  Provider to determine  Requested medications are on the active medications list.  yes  Last refill. 05/15/2023   Future visit scheduled.   yes  Notes to clinic.  Medication is historical. Refill not delegated.    Requested Prescriptions  Pending Prescriptions Disp Refills   traMADol (ULTRAM) 50 MG tablet 30 tablet     Sig: Take by mouth.     Not Delegated - Analgesics:  Opioid Agonists Failed - 10/25/2023  2:49 PM      Failed - This refill cannot be delegated      Failed - Urine Drug Screen completed in last 360 days      Passed - Valid encounter within last 3 months    Recent Outpatient Visits           3 weeks ago Need for influenza vaccination   [place] [facility] Melvin Pao, NP   6 months ago Primary hypertension   [place] [facility] Melvin Pao, NP   11 months ago Stage 3a chronic kidney disease (CKD) [facility])   [place] [facility] Melvin Pao, NP   1 year ago Anemia due to vitamin B12 deficiency, unspecified B12 deficiency type   [place] [facility] Mecum, Erin E, PA-C   1 year ago Anemia due to vitamin B12 deficiency, unspecified B12 deficiency type   [place] [facility] Melvin Pao, NP       Future Appointments             In 5 months Melvin Pao, NP [place] [facility]

## 2023-10-30 ENCOUNTER — Other Ambulatory Visit: Payer: Self-pay | Admitting: Physician Assistant

## 2023-10-31 DIAGNOSIS — H2513 Age-related nuclear cataract, bilateral: Secondary | ICD-10-CM | POA: Diagnosis not present

## 2023-10-31 DIAGNOSIS — M3210 Systemic lupus erythematosus, organ or system involvement unspecified: Secondary | ICD-10-CM | POA: Diagnosis not present

## 2023-10-31 DIAGNOSIS — Z79899 Other long term (current) drug therapy: Secondary | ICD-10-CM | POA: Diagnosis not present

## 2023-10-31 DIAGNOSIS — H52223 Regular astigmatism, bilateral: Secondary | ICD-10-CM | POA: Diagnosis not present

## 2023-10-31 DIAGNOSIS — H43812 Vitreous degeneration, left eye: Secondary | ICD-10-CM | POA: Diagnosis not present

## 2023-10-31 DIAGNOSIS — H5213 Myopia, bilateral: Secondary | ICD-10-CM | POA: Diagnosis not present

## 2023-10-31 DIAGNOSIS — H524 Presbyopia: Secondary | ICD-10-CM | POA: Diagnosis not present

## 2023-10-31 DIAGNOSIS — Z8669 Personal history of other diseases of the nervous system and sense organs: Secondary | ICD-10-CM | POA: Diagnosis not present

## 2023-11-04 NOTE — Telephone Encounter (Signed)
 Requested Prescriptions  Pending Prescriptions Disp Refills   traZODone  (DESYREL ) 50 MG tablet [Pharmacy Med Name: TRAZODONE  50 MG TABLET] 90 tablet 0    Sig: TAKE 1 TABLET BY MOUTH AT BEDTIME AS NEEDED FOR SLEEP     Psychiatry: Antidepressants - Serotonin Modulator Passed - 11/04/2023  9:04 AM      Passed - Completed PHQ-2 or PHQ-9 in the last 360 days      Passed - Valid encounter within last 6 months    Recent Outpatient Visits           1 month ago Need for influenza vaccination   [place] [facility] Melvin Pao, NP   7 months ago Primary hypertension   [place] [facility] Melvin Pao, NP   11 months ago Stage [facility]a [facility]   [place] [facility] Melvin Pao, NP   1 year ago Anemia due to vitamin B12 deficiency, unspecified B12 deficiency type   [place] [facility] Mecum, Erin E, PA-C   1 year ago Anemia due to vitamin B12 deficiency, unspecified B12 deficiency type   [place] [facility] Melvin Pao, NP       Future Appointments             In 5 months Melvin Pao, NP [place] [facility]

## 2023-11-14 IMAGING — DX DG CHEST 2V
2 series · 2 of 2 positions shown · non-contrast
Comparison: None.

CLINICAL DATA: Chronic sinuitis since 07/22/2021. Shortness of
breath on exertion and cough.

EXAM:
CHEST - 2 VIEW

[chest pa]
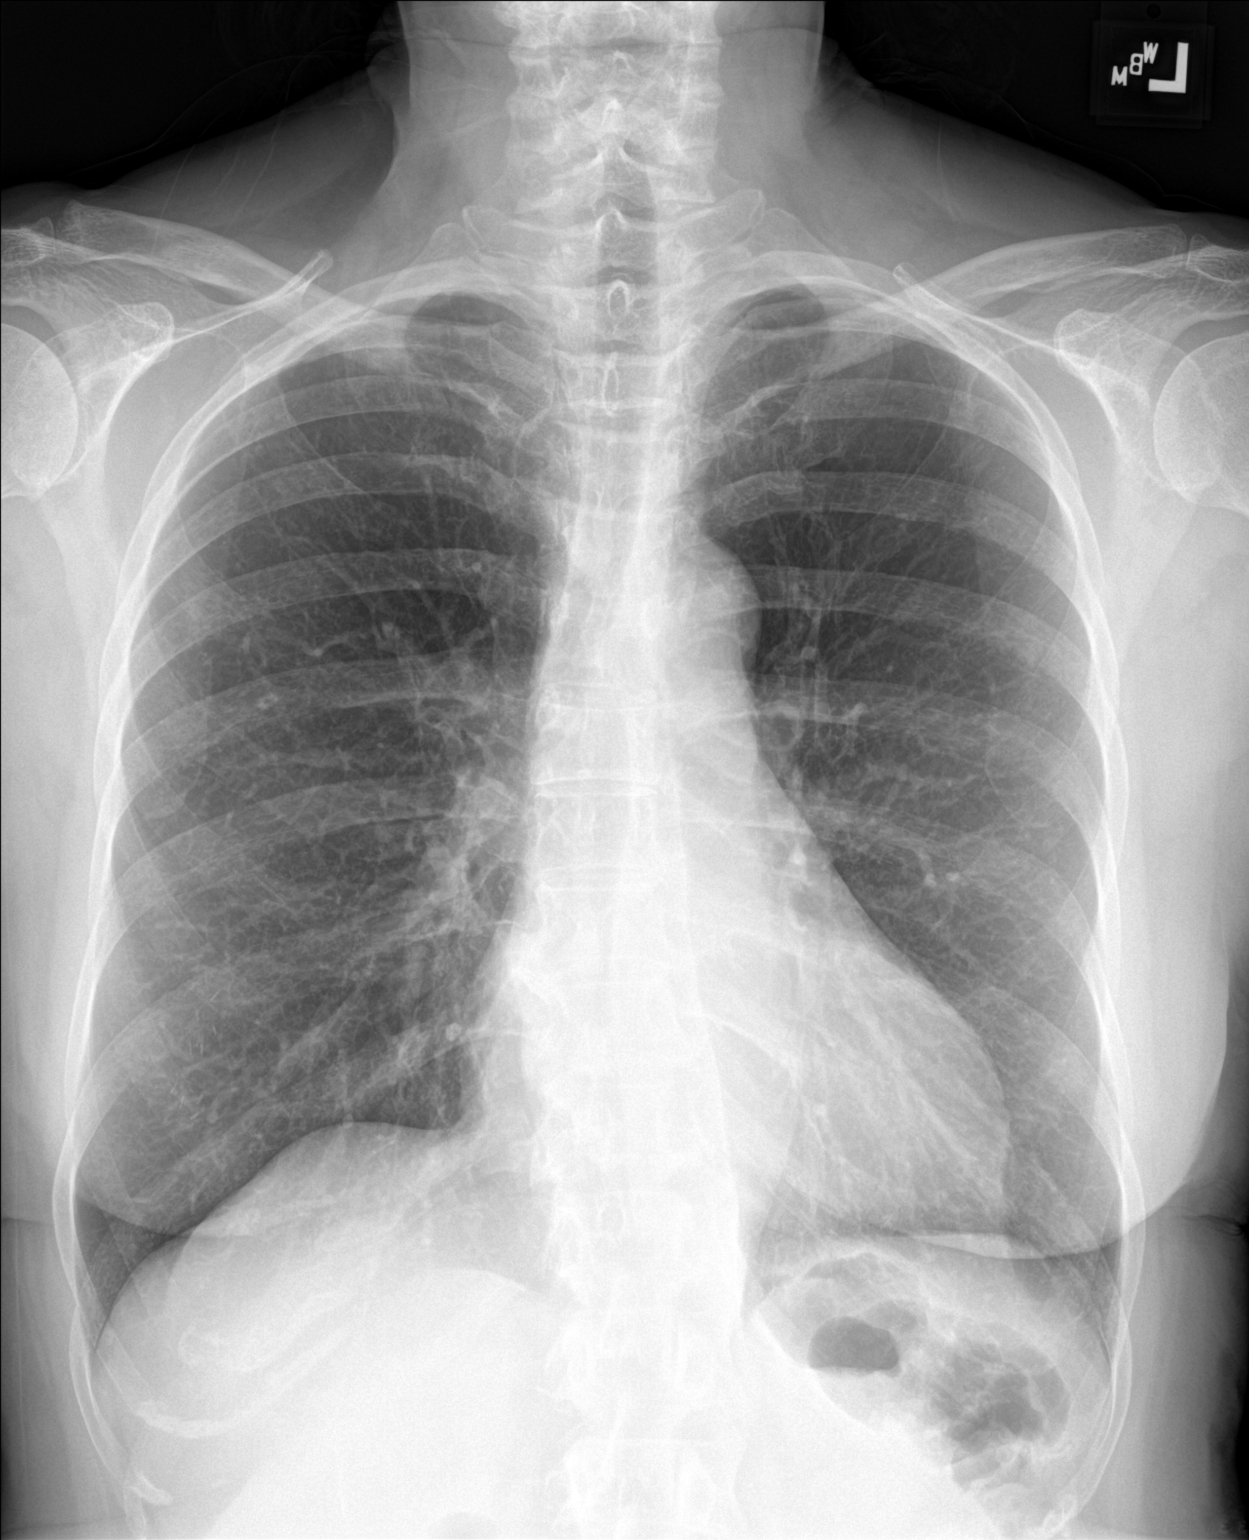

[chest lat]
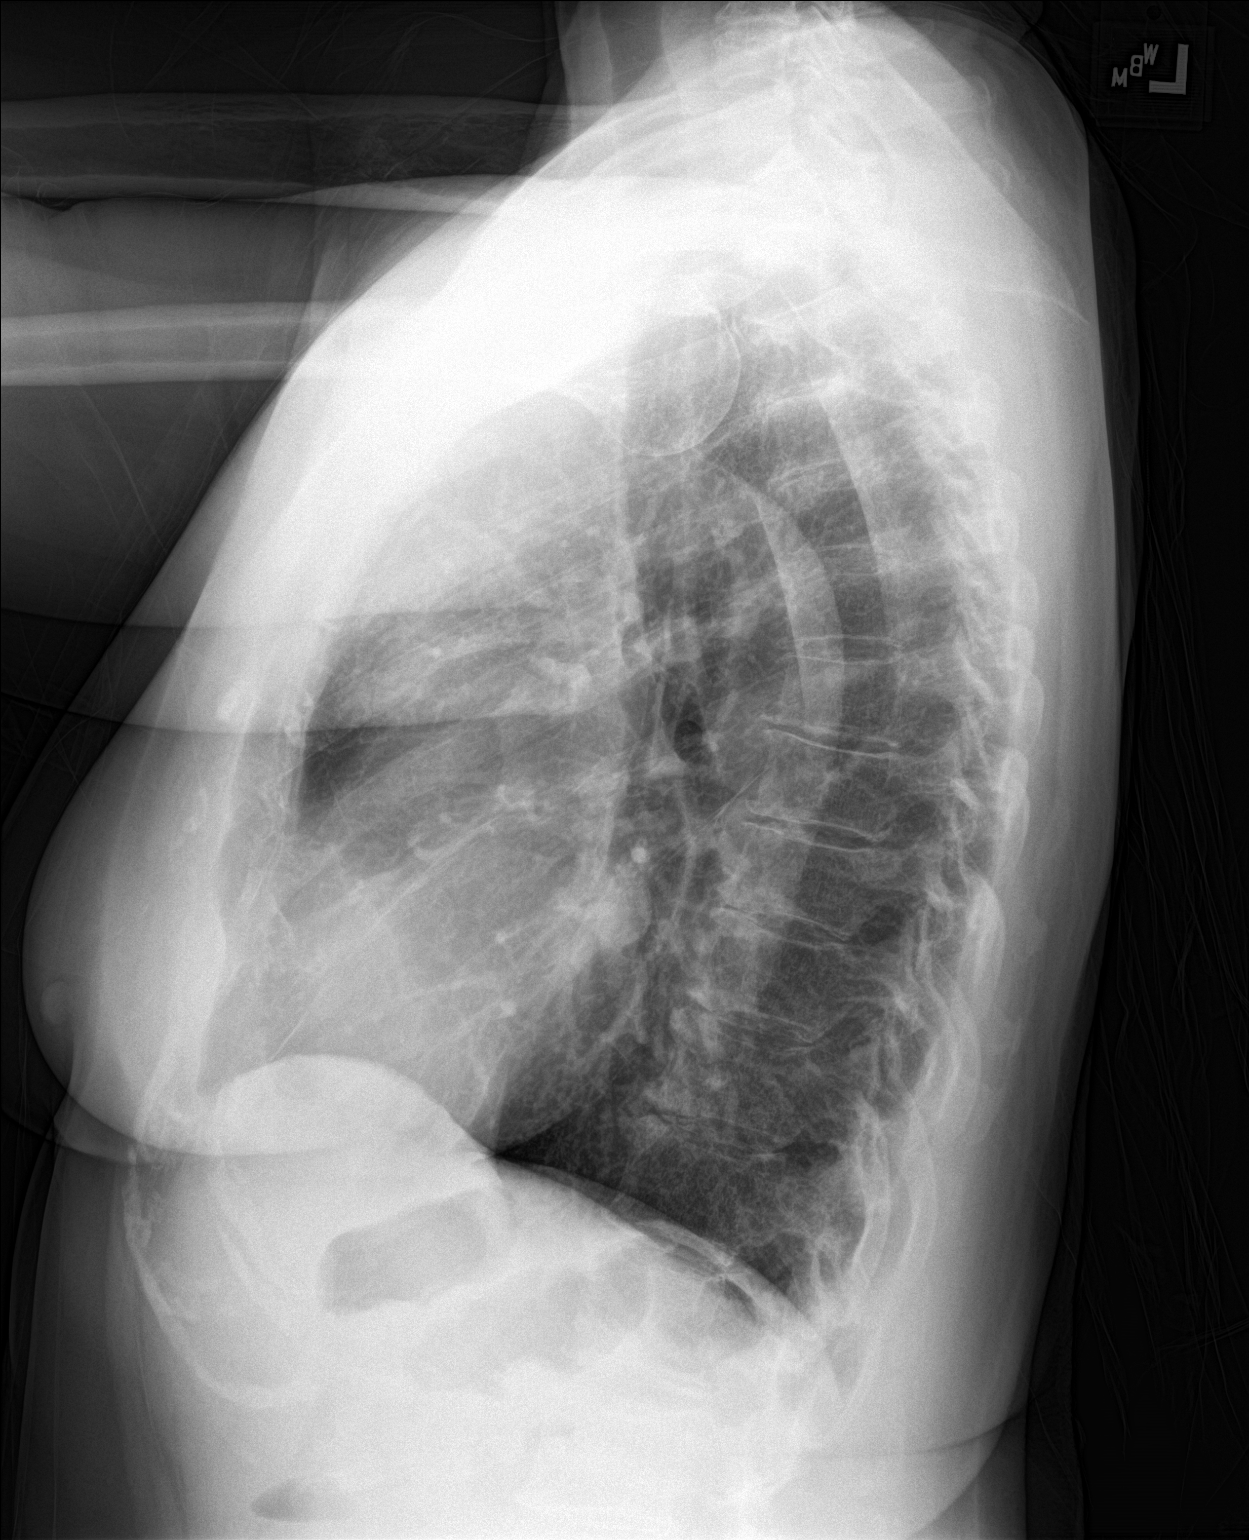

[2 of 2 positions shown; findings below may reference images not displayed]

FINDINGS: The heart size and mediastinal contours are within normal limits.
Both lungs are clear. The visualized skeletal structures are
unremarkable.
IMPRESSION: No active cardiopulmonary disease.

## 2023-11-22 DIAGNOSIS — H2513 Age-related nuclear cataract, bilateral: Secondary | ICD-10-CM | POA: Diagnosis not present

## 2023-11-22 DIAGNOSIS — H25043 Posterior subcapsular polar age-related cataract, bilateral: Secondary | ICD-10-CM | POA: Diagnosis not present

## 2023-11-22 DIAGNOSIS — H25013 Cortical age-related cataract, bilateral: Secondary | ICD-10-CM | POA: Diagnosis not present

## 2023-11-22 DIAGNOSIS — H2512 Age-related nuclear cataract, left eye: Secondary | ICD-10-CM | POA: Diagnosis not present

## 2023-12-05 DIAGNOSIS — H25012 Cortical age-related cataract, left eye: Secondary | ICD-10-CM | POA: Diagnosis not present

## 2023-12-05 DIAGNOSIS — H2512 Age-related nuclear cataract, left eye: Secondary | ICD-10-CM | POA: Diagnosis not present

## 2023-12-06 DIAGNOSIS — H2511 Age-related nuclear cataract, right eye: Secondary | ICD-10-CM | POA: Diagnosis not present

## 2023-12-18 DIAGNOSIS — M9902 Segmental and somatic dysfunction of thoracic region: Secondary | ICD-10-CM | POA: Diagnosis not present

## 2023-12-18 DIAGNOSIS — M40292 Other kyphosis, cervical region: Secondary | ICD-10-CM | POA: Diagnosis not present

## 2023-12-18 DIAGNOSIS — M9901 Segmental and somatic dysfunction of cervical region: Secondary | ICD-10-CM | POA: Diagnosis not present

## 2023-12-18 DIAGNOSIS — M542 Cervicalgia: Secondary | ICD-10-CM | POA: Diagnosis not present

## 2023-12-20 DIAGNOSIS — M542 Cervicalgia: Secondary | ICD-10-CM | POA: Diagnosis not present

## 2023-12-20 DIAGNOSIS — M40292 Other kyphosis, cervical region: Secondary | ICD-10-CM | POA: Diagnosis not present

## 2023-12-20 DIAGNOSIS — M9901 Segmental and somatic dysfunction of cervical region: Secondary | ICD-10-CM | POA: Diagnosis not present

## 2023-12-20 DIAGNOSIS — M9902 Segmental and somatic dysfunction of thoracic region: Secondary | ICD-10-CM | POA: Diagnosis not present

## 2023-12-23 DIAGNOSIS — N183 Chronic kidney disease, stage 3 unspecified: Secondary | ICD-10-CM | POA: Diagnosis not present

## 2023-12-23 DIAGNOSIS — I10 Essential (primary) hypertension: Secondary | ICD-10-CM | POA: Diagnosis not present

## 2023-12-26 DIAGNOSIS — H25041 Posterior subcapsular polar age-related cataract, right eye: Secondary | ICD-10-CM | POA: Diagnosis not present

## 2023-12-26 DIAGNOSIS — H25011 Cortical age-related cataract, right eye: Secondary | ICD-10-CM | POA: Diagnosis not present

## 2023-12-26 DIAGNOSIS — H2511 Age-related nuclear cataract, right eye: Secondary | ICD-10-CM | POA: Diagnosis not present

## 2024-01-02 DIAGNOSIS — M329 Systemic lupus erythematosus, unspecified: Secondary | ICD-10-CM | POA: Diagnosis not present

## 2024-01-02 DIAGNOSIS — Z79899 Other long term (current) drug therapy: Secondary | ICD-10-CM | POA: Diagnosis not present

## 2024-01-02 DIAGNOSIS — N183 Chronic kidney disease, stage 3 unspecified: Secondary | ICD-10-CM | POA: Diagnosis not present

## 2024-01-13 DIAGNOSIS — M542 Cervicalgia: Secondary | ICD-10-CM | POA: Diagnosis not present

## 2024-01-13 DIAGNOSIS — M40292 Other kyphosis, cervical region: Secondary | ICD-10-CM | POA: Diagnosis not present

## 2024-01-13 DIAGNOSIS — M9902 Segmental and somatic dysfunction of thoracic region: Secondary | ICD-10-CM | POA: Diagnosis not present

## 2024-01-13 DIAGNOSIS — M9901 Segmental and somatic dysfunction of cervical region: Secondary | ICD-10-CM | POA: Diagnosis not present

## 2024-01-14 NOTE — Progress Notes (Unsigned)
 LMP 08/22/2008    Subjective:    Patient ID: [name], female    DOB: 1956-10-17, 68 y.o.   MRN: [id number]  HPI: [name] is a 68 y.o. female  No chief complaint on file.  EAR PAIN Duration: {Blank single:19197::"days","weeks","months"} Involved ear(s): {Blank single:19197::"left","right","bilateral"} Severity:  {Blank single:19197::"mild","moderate","severe","1/10","2/10","3/10","4/10","5/10","6/10","7/10","8/10","9/10","10/10"}  Quality:  {Blank multiple:19196::"sharp","dull","aching","burning","cramping","ill-defined","itchy","pressure-like","pulling","shooting","sore","stabbing","tender","tearing","throbbing"} Fever: {Blank single:19197::"yes","no"} Otorrhea: {Blank single:19197::"yes","no"} Upper respiratory infection symptoms: {Blank single:19197::"yes","no"} Pruritus: {Blank single:19197::"yes","no"} Hearing loss: {Blank single:19197::"yes","no"} Water immersion {Blank single:19197::"yes","no"} Using Q-tips: {Blank single:19197::"yes","no"} Recurrent otitis media: {Blank single:19197::"yes","no"} Status: {Blank multiple:19196::"better","worse","stable","fluctuating"} Treatments attempted: {Blank single:19197::"none","pseudoephedrine"}  Relevant past medical, surgical, family and social history reviewed and updated as indicated. Interim medical history since our last visit reviewed. Allergies and medications reviewed and updated.  Review of Systems  Per HPI unless specifically indicated above     Objective:    LMP 08/22/2008   Wt Readings from Last 3 Encounters:  10/03/23 188 lb 12.8 oz (85.6 kg)  06/12/23 189 lb 9.6 oz (86 kg)  05/15/23 188 lb (85.3 kg)    Physical Exam  Results for orders placed or performed in visit on 10/03/23  Microscopic Examination   Collection Time: 10/03/23  9:01 AM   Urine  Result Value Ref Range   WBC, UA None seen 0 - 5 /hpf   RBC, Urine None seen 0 - 2 /hpf   Epithelial Cells (non renal) 0-10 0 - 10 /hpf    Bacteria, UA None seen None seen/Few  Urinalysis, Routine w reflex microscopic   Collection Time: 10/03/23  9:01 AM  Result Value Ref Range   Specific Gravity, UA >1.030 (H) 1.005 - 1.030   pH, UA 5.5 5.0 - 7.5   Color, UA Yellow Yellow   Appearance Ur Clear Clear   Leukocytes,UA Negative Negative   Protein,UA 1+ (A) Negative/Trace   Glucose, UA Negative Negative   Ketones, UA Negative Negative   RBC, UA Negative Negative   Bilirubin, UA Negative Negative   Urobilinogen, Ur 0.2 0.2 - 1.0 mg/dL   Nitrite, UA Negative Negative   Microscopic Examination See below:   B12   Collection Time: 10/03/23  9:03 AM  Result Value Ref Range   Vitamin B-12 522 232 - 1,245 pg/mL  Vitamin D (25 hydroxy)   Collection Time: 10/03/23  9:03 AM  Result Value Ref Range   Vit D, 25-Hydroxy 28.5 (L) 30.0 - 100.0 ng/mL  CBC with Differential/Platelet   Collection Time: 10/03/23  9:03 AM  Result Value Ref Range   WBC 9.5 3.4 - 10.8 x10E3/uL   RBC 3.64 (L) 3.77 - 5.28 x10E6/uL   Hemoglobin 11.9 11.1 - 15.9 g/dL   Hematocrit 40.9 81.1 - 46.6 %   MCV 101 (H) 79 - 97 fL   MCH 32.7 26.6 - 33.0 pg   MCHC 32.4 31.5 - 35.7 g/dL   RDW 91.4 78.2 - 95.6 %   Platelets 521 (H) 150 - 450 x10E3/uL   Neutrophils 66 Not Estab. %   Lymphs 20 Not Estab. %   Monocytes 11 Not Estab. %   Eos 2 Not Estab. %   Basos 1 Not Estab. %   Neutrophils Absolute 6.2 1.4 - 7.0 x10E3/uL   Lymphocytes Absolute 1.9 0.7 - 3.1 x10E3/uL   Monocytes Absolute 1.0 (H) 0.1 - 0.9 x10E3/uL   EOS (ABSOLUTE) 0.2 0.0 - 0.4 x10E3/uL   Basophils Absolute 0.1 0.0 - 0.2 x10E3/uL   Immature Granulocytes 0 Not Estab. %   Immature Grans (Abs)  0.0 0.0 - 0.1 x10E3/uL  Comprehensive metabolic panel   Collection Time: 10/03/23  9:03 AM  Result Value Ref Range   Glucose 85 70 - 99 mg/dL   BUN 27 8 - 27 mg/dL   Creatinine, Ser 7.84 (H) 0.57 - 1.00 mg/dL   eGFR 47 (L) >69 GE/XBM/8.41   BUN/Creatinine Ratio 22 12 - 28   Sodium 142 134 - 144 mmol/L    Potassium 5.0 3.5 - 5.2 mmol/L   Chloride 106 96 - 106 mmol/L   CO2 23 20 - 29 mmol/L   Calcium 9.3 8.7 - 10.3 mg/dL   Total Protein 6.7 6.0 - 8.5 g/dL   Albumin 4.3 3.9 - 4.9 g/dL   Globulin, Total 2.4 1.5 - 4.5 g/dL   Bilirubin Total 0.2 0.0 - 1.2 mg/dL   Alkaline Phosphatase 91 44 - 121 IU/L   AST 21 0 - 40 IU/L   ALT 9 0 - 32 IU/L  Lipid panel   Collection Time: 10/03/23  9:03 AM  Result Value Ref Range   Cholesterol, Total 179 100 - 199 mg/dL   Triglycerides 60 0 - 149 mg/dL   HDL 62 >32 mg/dL   VLDL Cholesterol Cal 12 5 - 40 mg/dL   LDL Chol Calc (NIH) 440 (H) 0 - 99 mg/dL   Chol/HDL Ratio 2.9 0.0 - 4.4 ratio  TSH   Collection Time: 10/03/23  9:03 AM  Result Value Ref Range   TSH 1.610 0.450 - 4.500 uIU/mL  Iron, TIBC and Ferritin Panel   Collection Time: 10/03/23  9:03 AM  Result Value Ref Range   Total Iron Binding Capacity 277 250 - 450 ug/dL   UIBC 102 725 - 366 ug/dL   Iron 84 27 - 440 ug/dL   Iron Saturation 30 15 - 55 %   Ferritin 192 (H) 15 - 150 ng/mL  Lactate Dehydrogenase (LDH)   Collection Time: 10/03/23  9:03 AM  Result Value Ref Range   LDH 205 119 - 226 IU/L      Assessment & Plan:   Problem List Items Addressed This Visit   None    Follow up plan: No follow-ups on file.

## 2024-01-15 ENCOUNTER — Encounter: Payer: Self-pay | Admitting: Nurse Practitioner

## 2024-01-15 ENCOUNTER — Ambulatory Visit (INDEPENDENT_AMBULATORY_CARE_PROVIDER_SITE_OTHER): Admitting: Nurse Practitioner

## 2024-01-15 VITALS — BP 121/71 | HR 60 | Ht 66.0 in | Wt 191.2 lb

## 2024-01-15 DIAGNOSIS — H6121 Impacted cerumen, right ear: Secondary | ICD-10-CM | POA: Diagnosis not present

## 2024-01-15 DIAGNOSIS — H6593 Unspecified nonsuppurative otitis media, bilateral: Secondary | ICD-10-CM

## 2024-01-15 MED ORDER — METHYLPREDNISOLONE 4 MG PO TBPK: ORAL_TABLET | ORAL | 0 refills | Status: DC

## 2024-01-31 DIAGNOSIS — Z961 Presence of intraocular lens: Secondary | ICD-10-CM | POA: Diagnosis not present

## 2024-02-03 ENCOUNTER — Other Ambulatory Visit: Payer: Self-pay | Admitting: Nurse Practitioner

## 2024-02-04 NOTE — Telephone Encounter (Signed)
 Requested Prescriptions  Pending Prescriptions Disp Refills   traZODone (DESYREL) 50 MG tablet [Pharmacy Med Name: TRAZODONE 50 MG TABLET] 90 tablet 0    Sig: TAKE 1 TABLET BY MOUTH EVERY DAY AT BEDTIME AS NEEDED FOR SLEEP     Psychiatry: Antidepressants - Serotonin Modulator Failed - 02/04/2024 10:27 AM      Failed - Valid encounter within last 6 months    Recent Outpatient Visits           2 weeks ago Fluid level behind tympanic membrane of both ears   [facility] Aileen Alexanders, NP       Future Appointments             In 1 month Aileen Alexanders, NP [facility]            Passed - Completed PHQ-2 or PHQ-9 in the last 360 days

## 2024-02-17 DIAGNOSIS — D2372 Other benign neoplasm of skin of left lower limb, including hip: Secondary | ICD-10-CM | POA: Diagnosis not present

## 2024-02-17 DIAGNOSIS — M329 Systemic lupus erythematosus, unspecified: Secondary | ICD-10-CM | POA: Diagnosis not present

## 2024-02-17 DIAGNOSIS — M79672 Pain in left foot: Secondary | ICD-10-CM | POA: Diagnosis not present

## 2024-02-19 ENCOUNTER — Other Ambulatory Visit: Payer: Self-pay | Admitting: Nurse Practitioner

## 2024-02-22 NOTE — Telephone Encounter (Signed)
 Requested medication (s) are due for refill today: Yes  Requested medication (s) are on the active medication list: Yes  Last refill:  04/02/23  Future visit scheduled: Yes  Notes to clinic:  Unable to refill per protocol, cannot delegate.      Requested Prescriptions  Pending Prescriptions Disp Refills   clonazePAM  (KLONOPIN ) 0.5 MG tablet [Pharmacy Med Name: CLONAZEPAM  0.5 MG TABLET] 20 tablet     Sig: TAKE 1 TABLET BY MOUTH EVERY DAY AS NEEDED FOR ANXIETY     Not Delegated - Psychiatry: Anxiolytics/Hypnotics 2 Failed - 02/22/2024  1:02 AM      Failed - This refill cannot be delegated      Failed - Urine Drug Screen completed in last 360 days      Failed - Valid encounter within last 6 months    Recent Outpatient Visits           1 month ago Fluid level behind tympanic membrane of both ears   [place] [facility] Aileen Alexanders, NP       Future Appointments             In 1 month Aileen Alexanders, NP  [facility]            Passed - Patient is not pregnant

## 2024-03-23 DIAGNOSIS — M7989 Other specified soft tissue disorders: Secondary | ICD-10-CM | POA: Diagnosis not present

## 2024-03-23 DIAGNOSIS — M329 Systemic lupus erythematosus, unspecified: Secondary | ICD-10-CM | POA: Diagnosis not present

## 2024-03-23 DIAGNOSIS — M79672 Pain in left foot: Secondary | ICD-10-CM | POA: Diagnosis not present

## 2024-03-23 DIAGNOSIS — D2372 Other benign neoplasm of skin of left lower limb, including hip: Secondary | ICD-10-CM | POA: Diagnosis not present

## 2024-03-31 ENCOUNTER — Ambulatory Visit (INDEPENDENT_AMBULATORY_CARE_PROVIDER_SITE_OTHER): Admitting: Nurse Practitioner

## 2024-03-31 ENCOUNTER — Encounter: Payer: Self-pay | Admitting: Nurse Practitioner

## 2024-03-31 VITALS — BP 137/73 | HR 78 | Ht 66.0 in | Wt 196.0 lb

## 2024-03-31 DIAGNOSIS — M3214 Glomerular disease in systemic lupus erythematosus: Secondary | ICD-10-CM

## 2024-03-31 DIAGNOSIS — D75839 Thrombocytosis, unspecified: Secondary | ICD-10-CM | POA: Diagnosis not present

## 2024-03-31 DIAGNOSIS — I10 Essential (primary) hypertension: Secondary | ICD-10-CM

## 2024-03-31 DIAGNOSIS — N1831 Chronic kidney disease, stage 3a: Secondary | ICD-10-CM | POA: Diagnosis not present

## 2024-03-31 DIAGNOSIS — E559 Vitamin D deficiency, unspecified: Secondary | ICD-10-CM

## 2024-03-31 DIAGNOSIS — D519 Vitamin B12 deficiency anemia, unspecified: Secondary | ICD-10-CM | POA: Diagnosis not present

## 2024-03-31 DIAGNOSIS — R0683 Snoring: Secondary | ICD-10-CM | POA: Diagnosis not present

## 2024-03-31 DIAGNOSIS — D649 Anemia, unspecified: Secondary | ICD-10-CM | POA: Diagnosis not present

## 2024-03-31 NOTE — Assessment & Plan Note (Signed)
 Chronic, ongoing.  Followed by [facility] nephrology.  Recent note reviewed.  Labs ordered at visit today.

## 2024-03-31 NOTE — Assessment & Plan Note (Signed)
Chronic, ongoing.  Followed by [facility] nephrology, continue this collaboration.  Recent note reviewed and medication change.  Check CMP today.

## 2024-03-31 NOTE — Assessment & Plan Note (Signed)
 Labs ordered at visit today.  Will make recommendations based on lab results.

## 2024-03-31 NOTE — Assessment & Plan Note (Signed)
 Labs ordered at visit today.  Will make recommendations based on lab results.    Has not recently followed up with Hematology.

## 2024-03-31 NOTE — Assessment & Plan Note (Signed)
 Chronic, ongoing.  Followed by rheumatology.  Recent notes reviewed.  Is on Hydroxychloroquine .  Up to date on eye exams.  Continue to follow up with Rheumatology.

## 2024-03-31 NOTE — Progress Notes (Signed)
 BP 137/73   Pulse 78   Ht 5\' 6"  (1.676 m)   Wt 196 lb (88.9 kg)   LMP 08/22/2008   BMI 31.64 kg/m    Subjective:    Patient ID: [name], female    DOB: 05-25-1956, 68 y.o.   MRN: [id number]  HPI: [name] is a 68 y.o. female  Chief Complaint  Patient presents with   Medical Management of Chronic Issues   HYPERTENSION with Chronic Kidney Disease Hypertension status: controlled  Satisfied with current treatment? yes Duration of hypertension: years BP monitoring frequency:  daily BP range: 130/70 BP medication side effects:  no Medication compliance: excellent compliance Previous BP meds:enalapril  and chlorthalidone  Aspirin: no Recurrent headaches: no Visual changes: no Palpitations: no Dyspnea: no Chest pain: no Lower extremity edema: no Dizzy/lightheaded: no  ANEMIA Anemia status: controlled Etiology of anemia: B12 and CKD Duration of anemia treatment:  Compliance with treatment: excellent compliance Iron  supplementation side effects: no Severity of anemia: mild Fatigue: yes Decreased exercise tolerance: no  Dyspnea on exertion: no Palpitations: no Bleeding: no Pica: no   ANXIETY Patient states her anxiety has been good.  She takes about 1/5 tab of clonazepam  every couple of days.  She feels like she is doing well.    Patient states she goes to bed about 10 and she wakes up every hour.  She does snore.  She does sleep with her mouth open.  She does wake up with her mouth very dry.    Relevant past medical, surgical, family and social history reviewed and updated as indicated. Interim medical history since our last visit reviewed. Allergies and medications reviewed and updated.  Review of Systems  Constitutional:  Negative for fatigue.  Eyes:  Negative for visual disturbance.  Respiratory:  Negative for cough, chest tightness and shortness of breath.   Cardiovascular:  Negative for chest pain, palpitations and leg swelling.   Neurological:  Negative for dizziness and headaches.  Psychiatric/Behavioral:  The patient is nervous/anxious.     Per HPI unless specifically indicated above     Objective:    BP 137/73   Pulse 78   Ht 5\' 6"  (1.676 m)   Wt 196 lb (88.9 kg)   LMP 08/22/2008   BMI 31.64 kg/m   Wt Readings from Last 3 Encounters:  03/31/24 196 lb (88.9 kg)  01/15/24 191 lb 3.2 oz (86.7 kg)  10/03/23 188 lb 12.8 oz (85.6 kg)    Physical Exam Vitals and nursing note reviewed.  Constitutional:      General: She is not in acute distress.    Appearance: Normal appearance. She is not ill-appearing, toxic-appearing or diaphoretic.  HENT:     Head: Normocephalic.     Right Ear: External ear normal.     Left Ear: External ear normal.     Nose: Nose normal.     Mouth/Throat:     Mouth: Mucous membranes are moist.     Pharynx: Oropharynx is clear.  Eyes:     General:        Right eye: No discharge.        Left eye: No discharge.     Extraocular Movements: Extraocular movements intact.     Conjunctiva/sclera: Conjunctivae normal.     Pupils: Pupils are equal, round, and reactive to light.  Cardiovascular:     Rate and Rhythm: Normal rate and regular rhythm.     Heart sounds: No murmur heard. Pulmonary:  Effort: Pulmonary effort is normal. No respiratory distress.     Breath sounds: Normal breath sounds. No wheezing or rales.  Musculoskeletal:     Cervical back: Normal range of motion and neck supple.  Skin:    General: Skin is warm and dry.     Capillary Refill: Capillary refill takes less than 2 seconds.  Neurological:     General: No focal deficit present.     Mental Status: She is alert and oriented to person, place, and time. Mental status is at baseline.  Psychiatric:        Mood and Affect: Mood normal.        Behavior: Behavior normal.        Thought Content: Thought content normal.        Judgment: Judgment normal.     Results for orders placed or performed in visit on  10/03/23  Microscopic Examination   Collection Time: 10/03/23  9:01 AM   Urine  Result Value Ref Range   WBC, UA None seen 0 - 5 /hpf   RBC, Urine None seen 0 - 2 /hpf   Epithelial Cells (non renal) 0-10 0 - 10 /hpf   Bacteria, UA None seen None seen/Few  Urinalysis, Routine w reflex microscopic   Collection Time: 10/03/23  9:01 AM  Result Value Ref Range   Specific Gravity, UA >1.030 (H) 1.005 - 1.030   pH, UA 5.5 5.0 - 7.5   Color, UA Yellow Yellow   Appearance Ur Clear Clear   Leukocytes,UA Negative Negative   Protein,UA 1+ (A) Negative/Trace   Glucose, UA Negative Negative   Ketones, UA Negative Negative   RBC, UA Negative Negative   Bilirubin, UA Negative Negative   Urobilinogen, Ur 0.2 0.2 - 1.0 mg/dL   Nitrite, UA Negative Negative   Microscopic Examination See below:   B12   Collection Time: 10/03/23  9:03 AM  Result Value Ref Range   Vitamin B-12 522 232 - 1,245 pg/mL  Vitamin D  (25 hydroxy)   Collection Time: 10/03/23  9:03 AM  Result Value Ref Range   Vit D, 25-Hydroxy 28.5 (L) 30.0 - 100.0 ng/mL  CBC with Differential/Platelet   Collection Time: 10/03/23  9:03 AM  Result Value Ref Range   WBC 9.5 3.4 - 10.8 x10E3/uL   RBC 3.64 (L) 3.77 - 5.28 x10E6/uL   Hemoglobin 11.9 11.1 - 15.9 g/dL   Hematocrit 09.8 11.9 - 46.6 %   MCV 101 (H) 79 - 97 fL   MCH 32.7 26.6 - 33.0 pg   MCHC 32.4 31.5 - 35.7 g/dL   RDW 14.7 82.9 - 56.2 %   Platelets 521 (H) 150 - 450 x10E3/uL   Neutrophils 66 Not Estab. %   Lymphs 20 Not Estab. %   Monocytes 11 Not Estab. %   Eos 2 Not Estab. %   Basos 1 Not Estab. %   Neutrophils Absolute 6.2 1.4 - 7.0 x10E3/uL   Lymphocytes Absolute 1.9 0.7 - 3.1 x10E3/uL   Monocytes Absolute 1.0 (H) 0.1 - 0.9 x10E3/uL   EOS (ABSOLUTE) 0.2 0.0 - 0.4 x10E3/uL   Basophils Absolute 0.1 0.0 - 0.2 x10E3/uL   Immature Granulocytes 0 Not Estab. %   Immature Grans (Abs) 0.0 0.0 - 0.1 x10E3/uL  Comprehensive metabolic panel   Collection Time: 10/03/23   9:03 AM  Result Value Ref Range   Glucose 85 70 - 99 mg/dL   BUN 27 8 - 27 mg/dL   Creatinine, Ser 1.30 (  H) 0.57 - 1.00 mg/dL   eGFR 47 (L) >08 MV/HQI/6.96   BUN/Creatinine Ratio 22 12 - 28   Sodium 142 134 - 144 mmol/L   Potassium 5.0 3.5 - 5.2 mmol/L   Chloride 106 96 - 106 mmol/L   CO2 23 20 - 29 mmol/L   Calcium 9.3 8.7 - 10.3 mg/dL   Total Protein 6.7 6.0 - 8.5 g/dL   Albumin 4.3 3.9 - 4.9 g/dL   Globulin, Total 2.4 1.5 - 4.5 g/dL   Bilirubin Total 0.2 0.0 - 1.2 mg/dL   Alkaline Phosphatase 91 44 - 121 IU/L   AST 21 0 - 40 IU/L   ALT 9 0 - 32 IU/L  Lipid panel   Collection Time: 10/03/23  9:03 AM  Result Value Ref Range   Cholesterol, Total 179 100 - 199 mg/dL   Triglycerides 60 0 - 149 mg/dL   HDL 62 >29 mg/dL   VLDL Cholesterol Cal 12 5 - 40 mg/dL   LDL Chol Calc (NIH) 528 (H) 0 - 99 mg/dL   Chol/HDL Ratio 2.9 0.0 - 4.4 ratio  TSH   Collection Time: 10/03/23  9:03 AM  Result Value Ref Range   TSH 1.610 0.450 - 4.500 uIU/mL  Iron , TIBC and Ferritin Panel   Collection Time: 10/03/23  9:03 AM  Result Value Ref Range   Total Iron  Binding Capacity 277 250 - 450 ug/dL   UIBC 413 244 - 010 ug/dL   Iron  84 27 - 139 ug/dL   Iron  Saturation 30 15 - 55 %   Ferritin 192 (H) 15 - 150 ng/mL  Lactate Dehydrogenase (LDH)   Collection Time: 10/03/23  9:03 AM  Result Value Ref Range   LDH 205 119 - 226 IU/L      Assessment & Plan:   Problem List Items Addressed This Visit       Cardiovascular and Mediastinum   Hypertension   Chronic.  Controlled.  Continue with current medication regimen of enalapril  and Chlorthalidone .  Medications are prescribed by [facility].  Labs from the cancer center reviewed during visit today.  Return to clinic in 6 months for reevaluation.  Call sooner if concerns arise.         Genitourinary   Stage 3a chronic kidney disease (CKD) (HCC)   Chronic, ongoing.  Followed by [facility] nephrology.  Recent note reviewed.  Labs ordered at visit today.         Relevant Orders   Comprehensive metabolic panel with GFR   Stage III lupus nephritis (WHO) (HCC)   Chronic, ongoing.  Followed by [facility] nephrology, continue this collaboration.  Recent note reviewed and medication change.  Check CMP today.        Hematopoietic and Hemostatic   Thrombocytosis   Labs ordered at visit today.  Will make recommendations based on lab results.        Relevant Orders   CBC w/Diff     Other   Symptomatic anemia   Labs ordered at visit today.  Will make recommendations based on lab results.    Has not recently followed up with Hematology.      Relevant Orders   Iron , TIBC and Ferritin Panel   Systemic lupus erythematosus (HCC)   Chronic, ongoing.  Followed by rheumatology.  Recent notes reviewed.  Is on Hydroxychloroquine .  Up to date on eye exams.  Continue to follow up with Rheumatology.      Vitamin D  deficiency - Primary   Labs  ordered at visit today.  Will make recommendations based on lab results.        Relevant Orders   Vitamin D  (25 hydroxy)   B12 deficiency anemia   Labs ordered at visit today.  Will make recommendations based on lab results.        Relevant Orders   B12   Other Visit Diagnoses       Snoring       Relevant Orders   Ambulatory referral to Sleep Studies         Follow up plan: No follow-ups on file.

## 2024-03-31 NOTE — Assessment & Plan Note (Signed)
Chronic.  Controlled.  Continue with current medication regimen of enalapril and Chlorthalidone.  Medications are prescribed by [facility].  Labs from the cancer center reviewed during visit today.  Return to clinic in 6 months for reevaluation.  Call sooner if concerns arise.

## 2024-04-01 ENCOUNTER — Ambulatory Visit: Payer: Self-pay | Admitting: Nurse Practitioner

## 2024-04-01 LAB — CBC WITH DIFFERENTIAL/PLATELET
Basophils Absolute: 0.1 10*3/uL (ref 0.0–0.2)
Basos: 1 %
EOS (ABSOLUTE): 0.2 10*3/uL (ref 0.0–0.4)
Eos: 2 %
Hematocrit: 32.5 % — ABNORMAL LOW (ref 34.0–46.6)
Hemoglobin: 11 g/dL — ABNORMAL LOW (ref 11.1–15.9)
Immature Grans (Abs): 0 10*3/uL (ref 0.0–0.1)
Immature Granulocytes: 0 %
Lymphocytes Absolute: 2.7 10*3/uL (ref 0.7–3.1)
Lymphs: 25 %
MCH: 34.3 pg — ABNORMAL HIGH (ref 26.6–33.0)
MCHC: 33.8 g/dL (ref 31.5–35.7)
MCV: 101 fL — ABNORMAL HIGH (ref 79–97)
Monocytes Absolute: 1.5 10*3/uL — ABNORMAL HIGH (ref 0.1–0.9)
Monocytes: 13 %
Neutrophils Absolute: 6.5 10*3/uL (ref 1.4–7.0)
Neutrophils: 59 %
Platelets: 436 10*3/uL (ref 150–450)
RBC: 3.21 x10E6/uL — ABNORMAL LOW (ref 3.77–5.28)
RDW: 12.2 % (ref 11.7–15.4)
WBC: 11 10*3/uL — ABNORMAL HIGH (ref 3.4–10.8)

## 2024-04-01 LAB — COMPREHENSIVE METABOLIC PANEL WITH GFR
ALT: 13 IU/L (ref 0–32)
AST: 21 IU/L (ref 0–40)
Albumin: 4.4 g/dL (ref 3.9–4.9)
Alkaline Phosphatase: 103 IU/L (ref 44–121)
BUN/Creatinine Ratio: 16 (ref 12–28)
BUN: 23 mg/dL (ref 8–27)
Bilirubin Total: 0.3 mg/dL (ref 0.0–1.2)
CO2: 23 mmol/L (ref 20–29)
Calcium: 9.6 mg/dL (ref 8.7–10.3)
Chloride: 105 mmol/L (ref 96–106)
Creatinine, Ser: 1.42 mg/dL — ABNORMAL HIGH (ref 0.57–1.00)
Globulin, Total: 2.7 g/dL (ref 1.5–4.5)
Glucose: 82 mg/dL (ref 70–99)
Potassium: 4.7 mmol/L (ref 3.5–5.2)
Sodium: 144 mmol/L (ref 134–144)
Total Protein: 7.1 g/dL (ref 6.0–8.5)
eGFR: 40 mL/min/{1.73_m2} — ABNORMAL LOW (ref >59)

## 2024-04-01 LAB — IRON,TIBC AND FERRITIN PANEL
Ferritin: 175 ng/mL — ABNORMAL HIGH (ref 15–150)
Iron Saturation: 22 % (ref 15–55)
Iron: 63 ug/dL (ref 27–139)
Total Iron Binding Capacity: 282 ug/dL (ref 250–450)
UIBC: 219 ug/dL (ref 118–369)

## 2024-04-01 LAB — VITAMIN B12: Vitamin B-12: 652 pg/mL (ref 232–1245)

## 2024-04-01 LAB — VITAMIN D 25 HYDROXY (VIT D DEFICIENCY, FRACTURES): Vit D, 25-Hydroxy: 34.9 ng/mL (ref 30.0–100.0)

## 2024-04-02 ENCOUNTER — Ambulatory Visit: Payer: Self-pay | Admitting: Nurse Practitioner

## 2024-04-06 ENCOUNTER — Other Ambulatory Visit: Payer: Self-pay | Admitting: Nurse Practitioner

## 2024-04-06 ENCOUNTER — Other Ambulatory Visit: Payer: Self-pay | Admitting: Gastroenterology

## 2024-04-06 DIAGNOSIS — K219 Gastro-esophageal reflux disease without esophagitis: Secondary | ICD-10-CM

## 2024-04-06 NOTE — Telephone Encounter (Unsigned)
 Copied from CRM [id number]. Topic: Clinical - Medication Refill >> Apr 06, 2024  1:10 PM Donee H wrote: Medication: pantoprazole  (PROTONIX ) 20 MG tablet  Has the patient contacted their pharmacy? Yes (Agent: If no, request that the patient contact the pharmacy for the refill. If patient does not wish to contact the pharmacy document the reason why and proceed with request.) (Agent: If yes, when and what did the pharmacy advise?)  This is the patient's preferred pharmacy:  [id number] - [place], [place] - [address] [place] [address] Phone: [phone] Fax: [phone]  Is this the correct pharmacy for this prescription? Yes If no, delete pharmacy and type the correct one.   Has the prescription been filled recently? No  Is the patient out of the medication? Yes  Has the patient been seen for an appointment in the last year OR does the patient have an upcoming appointment? Yes  Can we respond through MyChart? Yes  Agent: Please be advised that Rx refills may take up to 3 business days. We ask that you follow-up with your pharmacy.

## 2024-04-06 NOTE — Telephone Encounter (Signed)
 Returned call to patient and further confirmed labs were within good ranges and provider has no current concerns. She appreciates the call and has no further concerns.

## 2024-04-06 NOTE — Telephone Encounter (Signed)
 Copied from CRM [id number]. Topic: Clinical - Lab/Test Results >> Apr 06, 2024 11:32 AM Felizardo Hotter wrote: Reason for CRM: Pt called for lab results for 03/31/2024 relayed message from Aileen Alexanders NP, but has questions she would like to discuss. Please call pt at [phone]

## 2024-04-08 DIAGNOSIS — M3214 Glomerular disease in systemic lupus erythematosus: Secondary | ICD-10-CM | POA: Diagnosis not present

## 2024-04-08 DIAGNOSIS — I10 Essential (primary) hypertension: Secondary | ICD-10-CM | POA: Diagnosis not present

## 2024-04-08 MED ORDER — PANTOPRAZOLE SODIUM 20 MG PO TBEC: 20.0000 mg | DELAYED_RELEASE_TABLET | Freq: Every day | ORAL | 1 refills | Status: DC

## 2024-04-08 NOTE — Telephone Encounter (Signed)
 Requested medication (s) are due for refill today: yes  Requested medication (s) are on the active medication list: yes  Last refill:  08/14/23  Future visit scheduled: yes  Notes to clinic:  routing to PCP for refill, lasted refilled by GI provider.     Requested Prescriptions  Pending Prescriptions Disp Refills   pantoprazole  (PROTONIX ) 20 MG tablet 90 tablet 1    Sig: Take 1 tablet (20 mg total) by mouth daily.     Gastroenterology: Proton Pump Inhibitors Failed - 04/08/2024  2:18 PM      Failed - Valid encounter within last 12 months    Recent Outpatient Visits           1 week ago Vitamin D  deficiency   [place] Aileen Alexanders, NP   2 months ago Fluid level behind tympanic membrane of both ears   [facility] Aileen Alexanders, NP

## 2024-04-17 DIAGNOSIS — G4730 Sleep apnea, unspecified: Secondary | ICD-10-CM | POA: Diagnosis not present

## 2024-05-01 ENCOUNTER — Encounter: Payer: Self-pay | Admitting: Nurse Practitioner

## 2024-05-05 ENCOUNTER — Telehealth: Payer: Self-pay

## 2024-05-05 NOTE — Telephone Encounter (Signed)
 Copied from CRM [id number]. Topic: Clinical - Lab/Test Results >> May 05, 2024  2:11 PM Silvana PARAS wrote: Reason for CRM: Pt calling to speak with Moishy Laday, [place]er HERO, CMA in regards to sleep study results. Callback number is [phone]

## 2024-05-06 NOTE — Telephone Encounter (Signed)
 Returned call to patient. She received notification that her sleep study results were available but could not view. She had some general questions about the process which we reviewed. She is aware provider will view her results and decide on next steps.

## 2024-05-07 NOTE — Telephone Encounter (Signed)
 Patient has mild sleep apnea.  Can we order CPAP with auto titration, please.

## 2024-05-07 NOTE — Telephone Encounter (Signed)
 Ordered cpap machine through [facility]. Patient should receive a text message to allow her to review her information, verify/upload insurance information and they will also be in touch with her through out the process.

## 2024-05-12 ENCOUNTER — Ambulatory Visit

## 2024-05-12 VITALS — Ht 66.0 in | Wt 187.0 lb

## 2024-05-12 DIAGNOSIS — Z780 Asymptomatic menopausal state: Secondary | ICD-10-CM

## 2024-05-12 DIAGNOSIS — Z1231 Encounter for screening mammogram for malignant neoplasm of breast: Secondary | ICD-10-CM

## 2024-05-12 DIAGNOSIS — Z0000 Encounter for general adult medical examination without abnormal findings: Secondary | ICD-10-CM | POA: Diagnosis not present

## 2024-05-12 NOTE — Progress Notes (Signed)
 Subjective:   [name] is a 68 y.o. who presents for a Medicare Wellness preventive visit.  As a reminder, Annual Wellness Visits don't include a physical exam, and some assessments may be limited, especially if this visit is performed virtually. We may recommend an in-person follow-up visit with your provider if needed.  Visit Complete: Virtual I connected with  [name] on 05/12/24 by a audio enabled telemedicine application and verified that I am speaking with the correct person using two identifiers.  Patient Location: Home  Provider Location: Home Office  I discussed the limitations of evaluation and management by telemedicine. The patient expressed understanding and agreed to proceed.  Vital Signs: Because this visit was a virtual/telehealth visit, some criteria may be missing or patient reported. Any vitals not documented were not able to be obtained and vitals that have been documented are patient reported.  VideoDeclined- This patient declined Librarian, academic. Therefore the visit was completed with audio only.  Persons Participating in Visit: Patient.  AWV Questionnaire: No: Patient Medicare AWV questionnaire was not completed prior to this visit.  Cardiac Risk Factors include: advanced age (>17men, >88 women);hypertension;obesity (BMI >30kg/m2);Other (see comment), Risk factor comments: mild OSA     Objective:    Today's Vitals   05/12/24 1553 05/12/24 1554  Weight: 187 lb (84.8 kg)   Height: 5' 6 (1.676 m)   PainSc:  4    Body mass index is 30.18 kg/m.     05/12/2024    4:19 PM 04/03/2023    1:40 PM 04/03/2023    1:18 PM 02/19/2023    2:05 PM 10/02/2022    3:04 PM 08/20/2022    2:42 PM 08/11/2022    3:49 PM  Advanced Directives  Does Patient Have a Medical Advance Directive? No No No No No No No  Would patient like information on creating a medical advance directive? Yes (MAU/Ambulatory/Procedural Areas -  Information given)   No - Patient declined No - Patient declined No - Patient declined No - Patient declined    Current Medications (verified) Outpatient Encounter Medications as of 05/12/2024  Medication Sig   aspirin EC 81 MG tablet Take 81 mg by mouth daily. Swallow whole.   chlorthalidone  (HYGROTON ) 25 MG tablet 25 mg daily.   Cholecalciferol (VITAMIN D3 PO) Take 1,000 Units by mouth daily.   clonazePAM  (KLONOPIN ) 0.5 MG tablet TAKE 1 TABLET BY MOUTH EVERY DAY AS NEEDED FOR ANXIETY   cyanocobalamin  1000 MCG tablet Take 1,000 mcg by mouth daily. Vitamin B12   Ferrous Sulfate (IRON ) 325 (65 Fe) MG TABS Take 1 tablet by mouth daily. (Patient taking differently: Take 1 tablet by mouth daily. Taking 1/2 tablet daily)   fluticasone  (FLONASE ) 50 MCG/ACT nasal spray Place 2 sprays into both nostrils daily. (Patient taking differently: Place 2 sprays into both nostrils daily. PRN)   hydroxychloroquine  (PLAQUENIL ) 200 MG tablet Take 200 mg by mouth every other day. (Patient taking differently: Take 200 mg by mouth every other day. Taking 1 tablet daily)   lipase/protease/amylase (CREON ) 36000 UNITS CPEP capsule Take 2 capsules with the first bite of each meal and 1 capsule with the first bite of each snack   meclizine  (ANTIVERT ) 12.5 MG tablet Take 1 tablet (12.5 mg total) by mouth 3 (three) times daily as needed for dizziness.   pantoprazole  (PROTONIX ) 20 MG tablet Take 1 tablet (20 mg total) by mouth daily.   telmisartan (MICARDIS) 40 MG tablet Take 40 mg  by mouth daily.   traZODone  (DESYREL ) 50 MG tablet TAKE 1 TABLET BY MOUTH EVERY DAY AT BEDTIME AS NEEDED FOR SLEEP (Patient taking differently: Takes 1/2 at bedtime prn)   chlorthalidone  (HYGROTON ) 25 MG tablet Take 12.5 mg by mouth in the morning. (Patient not taking: Reported on 05/12/2024)   traMADol (ULTRAM) 50 MG tablet Take by mouth. (Patient not taking: Reported on 05/12/2024)   No facility-administered encounter medications on file as of  05/12/2024.    Allergies (verified) Elemental sulfur, Sulfa antibiotics, Beef-derived drug products, Lambs quarters, and Milk-related compounds   History: Past Medical History:  Diagnosis Date   Abnormal pap    Anemia    Anxiety    History of lupus 1987   Hypertension    Low vitamin D  level    Renal insufficiency 1988   kidney biopsy 1988 and 1998- my kidneys function at like 48%   Past Surgical History:  Procedure Laterality Date   BONE MARROW BIOPSY  01/2021   BREAST BIOPSY Right 07/08/2017   BREAST EXCISIONAL BIOPSY Right 08/06/2017   BREAST EXCISIONAL BIOPSY Right 07/1999   BREAST LUMPECTOMY WITH RADIOACTIVE SEED LOCALIZATION Right 08/06/2017   Procedure: RIGHT BREAST LUMPECTOMY WITH RADIOACTIVE SEED LOCALIZATION ERAS PATHWAY;  Surgeon: Mikell Katz, MD;  Location: [facility];  Service: General;  Laterality: Right;   BREAST LUMPECTOMY WITH RADIOACTIVE SEED LOCALIZATION Right 08/19/2018   Procedure: RIGHT BREAST LUMPECTOMY WITH RADIOACTIVE SEED LOCALIZATION ERAS PATHWAY;  Surgeon: Mikell Katz, MD;  Location: [facility] OR;  Service: General;  Laterality: Right;   BREAST SURGERY  07/1999   right breast benign fibroadenoma   COLONOSCOPY  2012   COLONOSCOPY WITH PROPOFOL  N/A 05/01/2017   Procedure: COLONOSCOPY WITH PROPOFOL ;  Surgeon: Dellie Louanne MATSU, MD;  Location: [facility] ENDOSCOPY;  Service: Endoscopy;  Laterality: N/A;   COLONOSCOPY WITH PROPOFOL  N/A 12/18/2021   Procedure: COLONOSCOPY WITH PROPOFOL ;  Surgeon: Unk Corinn Skiff, MD;  Location: [facility] ENDOSCOPY;  Service: Gastroenterology;  Laterality: N/A;   CRYOTHERAPY     abnormal pap smear   ESOPHAGOGASTRODUODENOSCOPY (EGD) WITH PROPOFOL  N/A 05/01/2017   Procedure: ESOPHAGOGASTRODUODENOSCOPY (EGD) WITH PROPOFOL ;  Surgeon: Dellie Louanne MATSU, MD;  Location: [facility] ENDOSCOPY;  Service: Endoscopy;  Laterality: N/A;   ESOPHAGOGASTRODUODENOSCOPY (EGD) WITH PROPOFOL  N/A 12/18/2021   Procedure:  ESOPHAGOGASTRODUODENOSCOPY (EGD) WITH PROPOFOL ;  Surgeon: Unk Corinn Skiff, MD;  Location: [facility] ENDOSCOPY;  Service: Gastroenterology;  Laterality: N/A;   NASAL SEPTUM SURGERY  2007   TUBAL LIGATION  1990   Family History  Problem Relation Age of Onset   Dementia Mother    Kidney failure Mother    Cancer Father        oral cancer   Diabetes Paternal Aunt    Cancer Maternal Grandmother        ovarian cancer   Hypertension Maternal Grandmother    Diabetes Maternal Grandfather    Breast cancer Neg Hx    Social History   Socioeconomic History   Marital status: Widowed    Spouse name: Not on file   Number of children: 1   Years of education: Not on file   Highest education level: Not on file  Occupational History   Not on file  Tobacco Use   Smoking status: Never   Smokeless tobacco: Never  Vaping Use   Vaping status: Some Days   Substances: Flavoring, Mixture of cannabinoids  Substance and Sexual Activity   Alcohol use: Yes    Alcohol/week: 4.0 - 6.0 standard drinks of  alcohol    Types: 4 - 6 Standard drinks or equivalent per week    Comment: 1-2 drinks 2-3 times per month   Drug use: Yes    Types: Marijuana    Comment: few times a month (vape with marijuana)   Sexual activity: Yes    Partners: Male    Birth control/protection: Surgical, Post-menopausal    Comment: BTL, less than 5, after 16, no abnormal pap  Other Topics Concern   Not on file  Social History Narrative   Husband died 11-27-16 from lung cancer at age 68 yrs old.     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   Social Drivers of Corporate investment banker Strain: Low Risk  (05/12/2024)   Overall Financial Resource Strain (CARDIA)    Difficulty of Paying Living Expenses: Not hard at all  Food Insecurity: No Food Insecurity (05/12/2024)   Hunger Vital Sign    Worried About Running Out of Food in the Last Year: Never true    Ran Out of Food in the Last Year: Never true  Transportation Needs: No  Transportation Needs (05/12/2024)   PRAPARE - Administrator, Civil Service (Medical): No    Lack of Transportation (Non-Medical): No  Physical Activity: Insufficiently Active (05/12/2024)   Exercise Vital Sign    Days of Exercise per Week: 5 days    Minutes of Exercise per Session: 20 min  Stress: Stress Concern Present (05/12/2024)   [employer] of Occupational Health - Occupational Stress Questionnaire    Feeling of Stress: Rather much  Social Connections: Moderately Integrated (05/12/2024)   Social Connection and Isolation Panel    Frequency of Communication with Friends and Family: More than three times a week    Frequency of Social Gatherings with Friends and Family: More than three times a week    Attends Religious Services: More than 4 times per year    Active Member of [employer] or Organizations: Yes    Attends Banker Meetings: More than 4 times per year    Marital Status: Widowed    Tobacco Counseling Counseling given: Not Answered    Clinical Intake:  Pre-visit preparation completed: Yes  Pain : 0-10 Pain Score: 4  Pain Type: Chronic pain Pain Location: Back Pain Descriptors / Indicators: Aching     BMI - recorded: 30.18 Nutritional Status: BMI > 30  Obese Nutritional Risks: Nausea/ vomitting/ diarrhea (nausea) Diabetes: No  No results found for: HGBA1C   How often do you need to have someone help you when you read instructions, pamphlets, or other written materials from your doctor or pharmacy?: 1 - Never  Interpreter Needed?: No  Information entered by :: Vina Ned, CMA   Activities of Daily Living     05/12/2024    3:56 PM  In your present state of health, do you have any difficulty performing the following activities:  Hearing? 0  Vision? 0  Difficulty concentrating or making decisions? 1  Comment sometimes concentrating  Walking or climbing stairs? 0  Dressing or bathing? 0  Doing errands, shopping? 0   Preparing Food and eating ? N  Using the Toilet? N  In the past six months, have you accidently leaked urine? Y  Comment wears panty liner or pad  Do you have problems with loss of bowel control? N  Managing your Medications? N  Managing your Finances? N  Housekeeping or managing your Housekeeping? N    Patient Care Team: Melvin Pao, NP as  PCP - General (Nurse Practitioner) Cathlyn JAYSON Nikki Bobie FORBES, MD as Consulting Physician (Obstetrics and Gynecology) Laurice Francis NOVAK, OD (Optometry) Defoor, Elsie HERO, PA-C (Rheumatology) Henriette Clotilda CROME, MD as Consulting Physician (Nephrology) Ashley Soulier, DPM as Referring Physician (Podiatry) Isenstein, Arin L, MD (Dermatology) Effie Evalene PARAS, DC as Referring Physician (Chiropractic Medicine) Unk Corinn Skiff, MD as Consulting Physician (Gastroenterology)  I have updated your Care Teams any recent Medical Services you may have received from other providers in the past year.     Assessment:   This is a routine wellness examination for [place].  Hearing/Vision screen Hearing Screening - Comments:: Denies hearing loss  Vision Screening - Comments:: Gets routine eye exams, Dr. Francis Laurice, [place]   Goals Addressed               This Visit's Progress     Weight (lb) < 175 lb (79.4 kg) (pt-stated)   187 lb (84.8 kg)      Depression Screen     05/12/2024    4:14 PM 03/31/2024    1:59 PM 04/02/2023    2:25 PM 02/19/2023    1:59 PM 10/11/2022    3:51 PM 08/15/2022    3:27 PM 07/31/2022    3:39 PM  PHQ 2/9 Scores  PHQ - 2 Score 3 2 2 2  3 2   PHQ- 9 Score 9 6 11 4  11 7   Exception Documentation     Patient refusal      Fall Risk     05/12/2024    4:20 PM 03/31/2024    1:59 PM 04/02/2023    2:25 PM 02/19/2023    2:05 PM 10/11/2022    3:51 PM  Fall Risk   Falls in the past year? 1 1 1 1  0  Number falls in past yr: 0  1 0 0  Injury with Fall? 1  0 1 0  Risk for fall due to : History of fall(s);Impaired  balance/gait;Orthopedic patient   History of fall(s) No Fall Risks  Follow up Falls evaluation completed;Education provided  Falls evaluation completed Falls evaluation completed;Falls prevention discussed Falls evaluation completed      Data saved with a previous flowsheet row definition    MEDICARE RISK AT HOME:  Medicare Risk at Home Any stairs in or around the home?: Yes If so, are there any without handrails?: No Home free of loose throw rugs in walkways, pet beds, electrical cords, etc?: Yes Adequate lighting in your home to reduce risk of falls?: Yes Life alert?: No Use of a cane, walker or w/c?: No Grab bars in the bathroom?: No Shower chair or bench in shower?: No Elevated toilet seat or a handicapped toilet?: No  TIMED UP AND GO:  Was the test performed?  No  Cognitive Function: 6CIT completed        05/12/2024    4:22 PM 02/19/2023    2:16 PM  6CIT Screen  What Year? 0 points 0 points  What month? 0 points 0 points  What time? 0 points 0 points  Count back from 20 2 points 0 points  Months in reverse 0 points 0 points  Repeat phrase 0 points 0 points  Total Score 2 points 0 points    Immunizations Immunization History  Administered Date(s) Administered   Fluad Quad(high Dose 65+) 10/02/2021   Fluad Trivalent(High Dose 65+) 10/03/2023   Influenza,inj,Quad PF,6+ Mos 09/07/2016, 07/21/2019   Influenza-Unspecified 09/07/2016, 10/02/2021   PFIZER(Purple Top)SARS-COV-2 Vaccination 01/15/2020,  02/10/2020   Td 05/02/2000   Tdap 03/27/2018    Screening Tests Health Maintenance  Topic Date Due   Pneumococcal Vaccine: 50+ Years (1 of 2 - PCV) Never done   Zoster Vaccines- Shingrix (1 of 2) Never done   COVID-19 Vaccine (3 - Pfizer risk series) 03/09/2020   INFLUENZA VACCINE  05/22/2024   DEXA SCAN  07/05/2024   MAMMOGRAM  07/14/2024   Medicare Annual Wellness (AWV)  05/12/2025   DTaP/Tdap/Td (3 - Td or Tdap) 03/27/2028   Colonoscopy  12/19/2031   Hepatitis  C Screening  Completed   Hepatitis B Vaccines  Aged Out   HPV VACCINES  Aged Out   Meningococcal B Vaccine  Aged Out    Health Maintenance  Health Maintenance Due  Topic Date Due   Pneumococcal Vaccine: 50+ Years (1 of 2 - PCV) Never done   Zoster Vaccines- Shingrix (1 of 2) Never done   COVID-19 Vaccine (3 - Pfizer risk series) 03/09/2020   Health Maintenance Items Addressed: Mammogram ordered, DEXA ordered, See Nurse Notes at the end of this note  Additional Screening:  Vision Screening: Recommended annual ophthalmology exams for early detection of glaucoma and other disorders of the eye. Would you like a referral to an eye doctor? No    Dental Screening: Recommended annual dental exams for proper oral hygiene  Community Resource Referral / Chronic Care Management: CRR required this visit?  No   CCM required this visit?  No   Plan:    I have personally reviewed and noted the following in the patient's chart:   Medical and social history Use of alcohol, tobacco or illicit drugs  Current medications and supplements including opioid prescriptions. Patient is not currently taking opioid prescriptions. Functional ability and status Nutritional status Physical activity Advanced directives List of other physicians Hospitalizations, surgeries, and ER visits in previous 12 months Vitals Screenings to include cognitive, depression, and falls Referrals and appointments  In addition, I have reviewed and discussed with patient certain preventive protocols, quality metrics, and best practice recommendations. A written personalized care plan for preventive services as well as general preventive health recommendations were provided to patient.   Vina Ned, CMA   05/12/2024   After Visit Summary: (Mail) Due to this being a telephonic visit, the after visit summary with patients personalized plan was offered to patient via mail   Notes: 6 CIT Score - 2 Placed orders for  MMG and DEXA scan (due ~ 07/14/24) Needs pneumonia vaccine Declined Shingles and Covid vaccines Tdap 07/2022 at [facility] per patient Made f/u appt for 10/01/24

## 2024-05-12 NOTE — Patient Instructions (Signed)
 [name] , Thank you for taking time out of your busy schedule to complete your Annual Wellness Visit with me. I enjoyed our conversation and look forward to speaking with you again next year. I, as well as your care team,  appreciate your ongoing commitment to your health goals. Please review the following plan we discussed and let me know if I can assist you in the future. Your Game plan/ To Do List    Referrals: None   Follow up Visits: Next Medicare AWV with our clinical staff: 05/18/25 @ 10:00am (IN PERSON VISIT)   Have you seen your provider in the last 6 months (3 months if uncontrolled diabetes)? Yes Next Office Visit with your provider: 10/01/24 @ 3:00pm with Darice Petty, NP  Clinician Recommendations: Get a pneumonia shot at your next OV. Aim for 30 minutes of exercise or brisk walking, 6-8 glasses of water, and 5 servings of fruits and vegetables each day. I have placed an order for a mammogram (due 07/15/24) and bone density scan, please call [phone] [facility] to schedule at your earliest convenience.       This is a list of the screening recommended for you and due dates:  Health Maintenance  Topic Date Due   Pneumococcal Vaccine for age over 51 (1 of 2 - PCV) Never done   Zoster (Shingles) Vaccine (1 of 2) Never done   COVID-19 Vaccine (3 - Pfizer risk series) 03/09/2020   Flu Shot  05/22/2024   DEXA scan (bone density measurement)  07/05/2024   Mammogram  07/14/2024   Medicare Annual Wellness Visit  05/12/2025   DTaP/Tdap/Td vaccine (3 - Td or Tdap) 03/27/2028   Colon Cancer Screening  12/19/2031   Hepatitis C Screening  Completed   Hepatitis B Vaccine  Aged Out   HPV Vaccine  Aged Out   Meningitis B Vaccine  Aged Out    Advanced directives: (Provided) Advance directive discussed with you today. I have provided a copy for you to complete at home and have notarized. Once this is complete, please bring a copy in to our office so we  can scan it into your chart.  Advance Care Planning is important because it:  [x]  Makes sure you receive the medical care that is consistent with your values, goals, and preferences  [x]  It provides guidance to your family and loved ones and reduces their decisional burden about whether or not they are making the right decisions based on your wishes.  Follow the link provided in your after visit summary or read over the paperwork we have mailed to you to help you started getting your Advance Directives in place. If you need assistance in completing these, please reach out to us  so that we can help you!  See attachments for Preventive Care and Fall Prevention Tips.   Fall Prevention in the Home, Adult Falls can cause injuries and affect people of all ages. There are many simple things that you can do to make your home safe and to help prevent falls. If you need it, ask for help making these changes. What actions can I take to prevent falls? General information Use good lighting in all rooms. Make sure to: Replace any light bulbs that burn out. Turn on lights if it is dark and use night-lights. Keep items that you use often in easy-to-reach places. Lower the shelves around your home if needed. Move furniture so that there are clear paths around it. Do  not keep throw rugs or other things on the floor that can make you trip. If any of your floors are uneven, fix them. Add color or contrast paint or tape to clearly mark and help you see: Grab bars or handrails. First and last steps of staircases. Where the edge of each step is. If you use a ladder or stepladder: Make sure that it is fully opened. Do not climb a closed ladder. Make sure the sides of the ladder are locked in place. Have someone hold the ladder while you use it. Know where your pets are as you move through your home. What can I do in the bathroom?     Keep the floor dry. Clean up any water that is on the floor right  away. Remove soap buildup in the bathtub or shower. Buildup makes bathtubs and showers slippery. Use non-skid mats or decals on the floor of the bathtub or shower. Attach bath mats securely with double-sided, non-slip rug tape. If you need to sit down while you are in the shower, use a non-slip stool. Install grab bars by the toilet and in the bathtub and shower. Do not use towel bars as grab bars. What can I do in the bedroom? Make sure that you have a light by your bed that is easy to reach. Do not use any sheets or blankets on your bed that hang to the floor. Have a firm bench or chair with side arms that you can use for support when you get dressed. What can I do in the kitchen? Clean up any spills right away. If you need to reach something above you, use a sturdy step stool that has a grab bar. Keep electrical cables out of the way. Do not use floor polish or wax that makes floors slippery. What can I do with my stairs? Do not leave anything on the stairs. Make sure that you have a light switch at the top and the bottom of the stairs. Have them installed if you do not have them. Make sure that there are handrails on both sides of the stairs. Fix handrails that are broken or loose. Make sure that handrails are as long as the staircases. Install non-slip stair treads on all stairs in your home if they do not have carpet. Avoid having throw rugs at the top or bottom of stairs, or secure the rugs with carpet tape to prevent them from moving. Choose a carpet design that does not hide the edge of steps on the stairs. Make sure that carpet is firmly attached to the stairs. Fix any carpet that is loose or worn. What can I do on the outside of my home? Use bright outdoor lighting. Repair the edges of walkways and driveways and fix any cracks. Clear paths of anything that can make you trip, such as tools or rocks. Add color or contrast paint or tape to clearly mark and help you see high doorway  thresholds. Trim any bushes or trees on the main path into your home. Check that handrails are securely fastened and in good repair. Both sides of all steps should have handrails. Install guardrails along the edges of any raised decks or porches. Have leaves, snow, and ice cleared regularly. Use sand, salt, or ice melt on walkways during winter months if you live where there is ice and snow. In the garage, clean up any spills right away, including grease or oil spills. What other actions can I take? Review your medicines with  your health care provider. Some medicines can make you confused or feel dizzy. This can increase your chance of falling. Wear closed-toe shoes that fit well and support your feet. Wear shoes that have rubber soles and low heels. Use a cane, walker, scooter, or crutches that help you move around if needed. Talk with your provider about other ways that you can decrease your risk of falls. This may include seeing a physical therapist to learn to do exercises to improve movement and strength. Where to find more information Centers for Disease Control and Prevention, STEADI: TonerPromos.no [employer] on Aging: BaseRingTones.pl National Institute on Aging: BaseRingTones.pl Contact a health care provider if: You are afraid of falling at home. You feel weak, drowsy, or dizzy at home. You fall at home. Get help right away if you: Lose consciousness or have trouble moving after a fall. Have a fall that causes a head injury. These symptoms may be an emergency. Get help right away. Call 911. Do not wait to see if the symptoms will go away. Do not drive yourself to the hospital. This information is not intended to replace advice given to you by your health care provider. Make sure you discuss any questions you have with your health care provider. Document Revised: 06/11/2022 Document Reviewed: 06/11/2022 Elsevier Patient Education  2024 [employer].

## 2024-05-19 DIAGNOSIS — Z79899 Other long term (current) drug therapy: Secondary | ICD-10-CM | POA: Diagnosis not present

## 2024-05-19 DIAGNOSIS — Z8669 Personal history of other diseases of the nervous system and sense organs: Secondary | ICD-10-CM | POA: Diagnosis not present

## 2024-05-19 DIAGNOSIS — M3210 Systemic lupus erythematosus, organ or system involvement unspecified: Secondary | ICD-10-CM | POA: Diagnosis not present

## 2024-06-09 ENCOUNTER — Ambulatory Visit
Admission: RE | Admit: 2024-06-09 | Discharge: 2024-06-09 | Disposition: A | Discharge status: Home / Self Care | Source: Ambulatory Visit | Attending: Nurse Practitioner | Admitting: Nurse Practitioner

## 2024-06-09 DIAGNOSIS — M85852 Other specified disorders of bone density and structure, left thigh: Secondary | ICD-10-CM | POA: Diagnosis not present

## 2024-06-09 DIAGNOSIS — Z780 Asymptomatic menopausal state: Secondary | ICD-10-CM | POA: Insufficient documentation

## 2024-06-10 ENCOUNTER — Ambulatory Visit: Payer: Self-pay | Admitting: Nurse Practitioner

## 2024-06-15 ENCOUNTER — Other Ambulatory Visit: Payer: Self-pay | Admitting: Nurse Practitioner

## 2024-06-16 NOTE — Telephone Encounter (Signed)
 Requested Prescriptions  Refused Prescriptions Disp Refills   traZODone  (DESYREL ) 50 MG tablet [Pharmacy Med Name: TRAZODONE  50 MG TABLET] 90 tablet 0    Sig: TAKE 1 TABLET BY MOUTH AT BEDTIME AS NEEDED FOR SLEEP     Psychiatry: Antidepressants - Serotonin Modulator Passed - 06/16/2024  4:26 PM      Passed - Completed PHQ-2 or PHQ-9 in the last 360 days      Passed - Valid encounter within last 6 months    Recent Outpatient Visits           2 months ago Vitamin D  deficiency   [facility] Melvin Pao, NP   5 months ago Fluid level behind tympanic membrane of both ears   [facility] Melvin Pao, NP

## 2024-06-23 DIAGNOSIS — D2372 Other benign neoplasm of skin of left lower limb, including hip: Secondary | ICD-10-CM | POA: Diagnosis not present

## 2024-06-23 DIAGNOSIS — M2041 Other hammer toe(s) (acquired), right foot: Secondary | ICD-10-CM | POA: Diagnosis not present

## 2024-06-23 DIAGNOSIS — M79672 Pain in left foot: Secondary | ICD-10-CM | POA: Diagnosis not present

## 2024-06-23 DIAGNOSIS — M2042 Other hammer toe(s) (acquired), left foot: Secondary | ICD-10-CM | POA: Diagnosis not present

## 2024-06-24 ENCOUNTER — Encounter: Payer: Self-pay | Admitting: Nurse Practitioner

## 2024-07-03 DIAGNOSIS — S61412A Laceration without foreign body of left hand, initial encounter: Secondary | ICD-10-CM | POA: Diagnosis not present

## 2024-07-03 DIAGNOSIS — D2262 Melanocytic nevi of left upper limb, including shoulder: Secondary | ICD-10-CM | POA: Diagnosis not present

## 2024-07-03 DIAGNOSIS — D225 Melanocytic nevi of trunk: Secondary | ICD-10-CM | POA: Diagnosis not present

## 2024-07-03 DIAGNOSIS — D2261 Melanocytic nevi of right upper limb, including shoulder: Secondary | ICD-10-CM | POA: Diagnosis not present

## 2024-07-03 DIAGNOSIS — L821 Other seborrheic keratosis: Secondary | ICD-10-CM | POA: Diagnosis not present

## 2024-07-06 DIAGNOSIS — M329 Systemic lupus erythematosus, unspecified: Secondary | ICD-10-CM | POA: Diagnosis not present

## 2024-07-06 DIAGNOSIS — Z79899 Other long term (current) drug therapy: Secondary | ICD-10-CM | POA: Diagnosis not present

## 2024-07-07 DIAGNOSIS — D2372 Other benign neoplasm of skin of left lower limb, including hip: Secondary | ICD-10-CM | POA: Diagnosis not present

## 2024-07-16 ENCOUNTER — Ambulatory Visit
Admission: RE | Admit: 2024-07-16 | Discharge: 2024-07-16 | Disposition: A | Discharge status: Home / Self Care | Source: Ambulatory Visit | Attending: Nurse Practitioner

## 2024-07-16 DIAGNOSIS — Z1231 Encounter for screening mammogram for malignant neoplasm of breast: Secondary | ICD-10-CM

## 2024-07-21 ENCOUNTER — Other Ambulatory Visit: Payer: Self-pay | Admitting: Nurse Practitioner

## 2024-07-21 DIAGNOSIS — R928 Other abnormal and inconclusive findings on diagnostic imaging of breast: Secondary | ICD-10-CM

## 2024-07-22 ENCOUNTER — Ambulatory Visit
Admission: RE | Admit: 2024-07-22 | Discharge: 2024-07-22 | Disposition: A | Discharge status: Home / Self Care | Source: Ambulatory Visit | Attending: Nurse Practitioner | Admitting: Nurse Practitioner

## 2024-07-22 DIAGNOSIS — R928 Other abnormal and inconclusive findings on diagnostic imaging of breast: Secondary | ICD-10-CM

## 2024-07-22 DIAGNOSIS — R92322 Mammographic fibroglandular density, left breast: Secondary | ICD-10-CM | POA: Diagnosis not present

## 2024-09-03 ENCOUNTER — Other Ambulatory Visit: Payer: Self-pay | Admitting: Nurse Practitioner

## 2024-09-05 NOTE — Telephone Encounter (Signed)
 Requested medication (s) are due for refill today: Yes  Requested medication (s) are on the active medication list: Yes  Last refill:  Trazodone  02/04/24, clonazepam  02/24/24  Future visit scheduled: Yes  Notes to clinic:  Unable to refill per protocol, cannot delegate. Trazodone  (New Rx needed for to match what patient reported taking on 05/12/24 0.5 mg HS PRN sleep).      Requested Prescriptions  Pending Prescriptions Disp Refills   traZODone  (DESYREL ) 50 MG tablet [Pharmacy Med Name: TRAZODONE  50 MG TABLET] 90 tablet 0    Sig: TAKE 1 TABLET BY MOUTH AT BEDTIME AS NEEDED FOR SLEEP     Psychiatry: Antidepressants - Serotonin Modulator Passed - 09/05/2024  7:36 AM      Passed - Completed PHQ-2 or PHQ-9 in the last 360 days      Passed - Valid encounter within last 6 months    Recent Outpatient Visits           5 months ago Vitamin D  deficiency   [place] [facility] Melvin Pao, NP   7 months ago Fluid level behind tympanic membrane of both ears   [place] [facility] Melvin Pao, NP               clonazePAM  (KLONOPIN ) 0.5 MG tablet [Pharmacy Med Name: CLONAZEPAM  0.5 MG TABLET] 20 tablet     Sig: TAKE 1 TABLET BY MOUTH EVERY DAY AS NEEDED FOR ANXIETY     Not Delegated - Psychiatry: Anxiolytics/Hypnotics 2 Failed - 09/05/2024  7:36 AM      Failed - This refill cannot be delegated      Failed - Urine Drug Screen completed in last 360 days      Passed - Patient is not pregnant      Passed - Valid encounter within last 6 months    Recent Outpatient Visits           5 months ago Vitamin D  deficiency   [place] [facility] Melvin Pao, NP   7 months ago Fluid level behind tympanic membrane of both ears   [place] [facility] Melvin Pao, NP

## 2024-09-15 ENCOUNTER — Other Ambulatory Visit: Payer: Self-pay | Admitting: Nurse Practitioner

## 2024-09-15 DIAGNOSIS — K219 Gastro-esophageal reflux disease without esophagitis: Secondary | ICD-10-CM

## 2024-09-15 NOTE — Telephone Encounter (Signed)
 Requested Prescriptions  Pending Prescriptions Disp Refills   pantoprazole  (PROTONIX ) 20 MG tablet [Pharmacy Med Name: PANTOPRAZOLE  SOD DR 20 MG TAB] 90 tablet 1    Sig: TAKE 1 TABLET BY MOUTH EVERY DAY     Gastroenterology: Proton Pump Inhibitors Passed - 09/15/2024  5:44 PM      Passed - Valid encounter within last 12 months    Recent Outpatient Visits           5 months ago Vitamin D  deficiency   [place] [facility] Melvin Pao, NP   8 months ago Fluid level behind tympanic membrane of both ears   [place] [facility] Melvin Pao, NP

## 2024-10-01 ENCOUNTER — Ambulatory Visit: Admitting: Nurse Practitioner

## 2024-10-05 ENCOUNTER — Encounter: Payer: Self-pay | Admitting: Nurse Practitioner

## 2024-10-05 ENCOUNTER — Ambulatory Visit: Admitting: Nurse Practitioner

## 2024-10-05 VITALS — BP 132/78 | HR 75 | Temp 98.1°F | Ht 65.98 in | Wt 187.0 lb

## 2024-10-05 DIAGNOSIS — N1831 Chronic kidney disease, stage 3a: Secondary | ICD-10-CM

## 2024-10-05 DIAGNOSIS — D519 Vitamin B12 deficiency anemia, unspecified: Secondary | ICD-10-CM

## 2024-10-05 DIAGNOSIS — F329 Major depressive disorder, single episode, unspecified: Secondary | ICD-10-CM

## 2024-10-05 DIAGNOSIS — F419 Anxiety disorder, unspecified: Secondary | ICD-10-CM

## 2024-10-05 DIAGNOSIS — R0981 Nasal congestion: Secondary | ICD-10-CM

## 2024-10-05 DIAGNOSIS — G4733 Obstructive sleep apnea (adult) (pediatric): Secondary | ICD-10-CM | POA: Insufficient documentation

## 2024-10-05 DIAGNOSIS — Z23 Encounter for immunization: Secondary | ICD-10-CM

## 2024-10-05 DIAGNOSIS — M3214 Glomerular disease in systemic lupus erythematosus: Secondary | ICD-10-CM

## 2024-10-05 DIAGNOSIS — I10 Essential (primary) hypertension: Secondary | ICD-10-CM

## 2024-10-05 MED ORDER — PREDNISONE 10 MG PO TABS: 10.0000 mg | ORAL_TABLET | Freq: Every day | ORAL | 0 refills | Status: AC

## 2024-10-05 MED ORDER — MECLIZINE HCL 12.5 MG PO TABS: 12.5000 mg | ORAL_TABLET | Freq: Three times a day (TID) | ORAL | 0 refills | Status: AC | PRN

## 2024-10-05 NOTE — Assessment & Plan Note (Signed)
 Chronic. Doing well with CPAP.  She is compliant with using the machine.  Feels a difference the next day when she doesn't use it.  Still working to get the best fitting mask.

## 2024-10-05 NOTE — Assessment & Plan Note (Signed)
 Chronic.  Controlled.  Continue with current medication regimen of PRN Klonopin .  Refilled last month.  Return to clinic in 6 months for reevaluation.  Call sooner if concerns arise.

## 2024-10-05 NOTE — Assessment & Plan Note (Signed)
 Chronic.  Controlled.  Will supplement if needed.  Labs ordered today.  Return to clinic in 6 months for reevaluation.  Call sooner if concerns arise.

## 2024-10-05 NOTE — Progress Notes (Signed)
 BP 132/78 (BP Location: Right Arm, Cuff Size: Normal)   Pulse 75   Temp 98.1 F (36.7 C) (Oral)   Ht 5' 5.98 (1.676 m)   Wt 187 lb (84.8 kg)   LMP 08/22/2008   SpO2 97%   BMI 30.20 kg/m    Subjective:    Patient ID: [name], female    DOB: 06/23/1956, 68 y.o.   MRN: [id number]  HPI: [name] is a 68 y.o. female  Chief Complaint  Patient presents with   office visit    6 month F/u. Patient stated she is now taking 1200MG  of Calcium because of findings in a bone density test in August.   HYPERTENSION with Chronic Kidney Disease Hypertension status: controlled  Satisfied with current treatment? yes Duration of hypertension: years BP monitoring frequency:  not checking BP range:  BP medication side effects:  no Medication compliance: excellent compliance Previous BP meds:enalapril  and chlorthalidone  Aspirin: no Recurrent headaches: yes Visual changes: no Palpitations: no Dyspnea: no Chest pain: no Lower extremity edema: no Dizzy/lightheaded: yes- vertigo  ANEMIA Hemoglobin was 9.9 in September.  Anemia status: controlled Etiology of anemia: B12 and CKD Duration of anemia treatment:  Compliance with treatment: excellent compliance Iron  supplementation side effects: no Severity of anemia: mild Fatigue: yes Decreased exercise tolerance: no  Dyspnea on exertion: no Palpitations: no Bleeding: no Pica: no   ANXIETY Patient states her anxiety has been good.  She takes about 1/5 tab of clonazepam  every couple of days.  She feels like she is doing well.  She rarely uses a whole one.   SLEEP APNEA Has been using August 2025.  Having trouble with the mask fitting well.  She has only missed a few nights since August.  She can tell on the days she doesn't use it she is more tired.  She does take Trazodone  as needed.  Taking a whole pill makes her groggy the next day.   Sleep apnea status: better Duration: months Satisfied with current treatment?:   yes CPAP use:  yes Sleep quality with CPAP use: good, average Treament compliance:excellent compliance Last sleep study: 2025 Treatments attempted: CPAP Wakes feeling refreshed:  yes Daytime hypersomnolence:  no Fatigue: yes Insomnia:  no Good sleep hygiene:  no Difficulty falling asleep:  no Difficulty staying asleep:  no Snoring bothers bed partner:  no Observed apnea by bed partner: no Obesity:  no Hypertension: no  Pulmonary hypertension:  no Coronary artery disease:  no   Relevant past medical, surgical, family and social history reviewed and updated as indicated. Interim medical history since our last visit reviewed. Allergies and medications reviewed and updated.  Review of Systems  Constitutional:  Negative for fatigue.  Eyes:  Negative for visual disturbance.  Respiratory:  Negative for cough, chest tightness and shortness of breath.   Cardiovascular:  Negative for chest pain, palpitations and leg swelling.  Neurological:  Positive for dizziness and headaches.  Psychiatric/Behavioral:  The patient is nervous/anxious.     Per HPI unless specifically indicated above     Objective:    BP 132/78 (BP Location: Right Arm, Cuff Size: Normal)   Pulse 75   Temp 98.1 F (36.7 C) (Oral)   Ht 5' 5.98 (1.676 m)   Wt 187 lb (84.8 kg)   LMP 08/22/2008   SpO2 97%   BMI 30.20 kg/m   Wt Readings from Last 3 Encounters:  10/05/24 187 lb (84.8 kg)  05/12/24 187 lb (84.8 kg)  03/31/24  196 lb (88.9 kg)    Physical Exam Vitals and nursing note reviewed.  Constitutional:      General: She is not in acute distress.    Appearance: Normal appearance. She is not ill-appearing, toxic-appearing or diaphoretic.  HENT:     Head: Normocephalic.     Right Ear: External ear normal.     Left Ear: External ear normal.     Nose: Nose normal.     Mouth/Throat:     Mouth: Mucous membranes are moist.     Pharynx: Oropharynx is clear.  Eyes:     General:        Right eye: No  discharge.        Left eye: No discharge.     Extraocular Movements: Extraocular movements intact.     Conjunctiva/sclera: Conjunctivae normal.     Pupils: Pupils are equal, round, and reactive to light.  Cardiovascular:     Rate and Rhythm: Normal rate and regular rhythm.     Heart sounds: No murmur heard. Pulmonary:     Effort: Pulmonary effort is normal. No respiratory distress.     Breath sounds: Normal breath sounds. No wheezing or rales.  Musculoskeletal:     Cervical back: Normal range of motion and neck supple.  Skin:    General: Skin is warm and dry.     Capillary Refill: Capillary refill takes less than 2 seconds.  Neurological:     General: No focal deficit present.     Mental Status: She is alert and oriented to person, place, and time. Mental status is at baseline.  Psychiatric:        Mood and Affect: Mood normal.        Behavior: Behavior normal.        Thought Content: Thought content normal.        Judgment: Judgment normal.     Results for orders placed or performed in visit on 03/31/24  Comprehensive metabolic panel with GFR   Collection Time: 03/31/24  2:00 PM  Result Value Ref Range   Glucose 82 70 - 99 mg/dL   BUN 23 8 - 27 mg/dL   Creatinine, Ser 8.57 (H) 0.57 - 1.00 mg/dL   eGFR 40 (L) >40 fO/fpw/8.26   BUN/Creatinine Ratio 16 12 - 28   Sodium 144 134 - 144 mmol/L   Potassium 4.7 3.5 - 5.2 mmol/L   Chloride 105 96 - 106 mmol/L   CO2 23 20 - 29 mmol/L   Calcium 9.6 8.7 - 10.3 mg/dL   Total Protein 7.1 6.0 - 8.5 g/dL   Albumin 4.4 3.9 - 4.9 g/dL   Globulin, Total 2.7 1.5 - 4.5 g/dL   Bilirubin Total 0.3 0.0 - 1.2 mg/dL   Alkaline Phosphatase 103 44 - 121 IU/L   AST 21 0 - 40 IU/L   ALT 13 0 - 32 IU/L  Vitamin D  (25 hydroxy)   Collection Time: 03/31/24  2:00 PM  Result Value Ref Range   Vit D, 25-Hydroxy 34.9 30.0 - 100.0 ng/mL  B12   Collection Time: 03/31/24  2:00 PM  Result Value Ref Range   Vitamin B-12 652 232 - 1,245 pg/mL  CBC w/Diff    Collection Time: 03/31/24  2:00 PM  Result Value Ref Range   WBC 11.0 (H) 3.4 - 10.8 x10E3/uL   RBC 3.21 (L) 3.77 - 5.28 x10E6/uL   Hemoglobin 11.0 (L) 11.1 - 15.9 g/dL   Hematocrit 67.4 (L) 65.9 - 46.6 %  MCV 101 (H) 79 - 97 fL   MCH 34.3 (H) 26.6 - 33.0 pg   MCHC 33.8 31.5 - 35.7 g/dL   RDW 87.7 88.2 - 84.5 %   Platelets 436 150 - 450 x10E3/uL   Neutrophils 59 Not Estab. %   Lymphs 25 Not Estab. %   Monocytes 13 Not Estab. %   Eos 2 Not Estab. %   Basos 1 Not Estab. %   Neutrophils Absolute 6.5 1.4 - 7.0 x10E3/uL   Lymphocytes Absolute 2.7 0.7 - 3.1 x10E3/uL   Monocytes Absolute 1.5 (H) 0.1 - 0.9 x10E3/uL   EOS (ABSOLUTE) 0.2 0.0 - 0.4 x10E3/uL   Basophils Absolute 0.1 0.0 - 0.2 x10E3/uL   Immature Granulocytes 0 Not Estab. %   Immature Grans (Abs) 0.0 0.0 - 0.1 x10E3/uL  Iron , TIBC and Ferritin Panel   Collection Time: 03/31/24  2:00 PM  Result Value Ref Range   Total Iron  Binding Capacity 282 250 - 450 ug/dL   UIBC 780 881 - 630 ug/dL   Iron  63 27 - 139 ug/dL   Iron  Saturation 22 15 - 55 %   Ferritin 175 (H) 15 - 150 ng/mL      Assessment & Plan:   Problem List Items Addressed This Visit       Cardiovascular and Mediastinum   Hypertension   Chronic.  Controlled.  Continue with current medication regimen of Telmisartan and Chlorthalidone .  Medications are prescribed by [facility].  Labs from the nephrology reviewed during visit today.  Return to clinic in 6 months for reevaluation.  Call sooner if concerns arise.         Respiratory   OSA (obstructive sleep apnea)   Chronic. Doing well with CPAP.  She is compliant with using the machine.  Feels a difference the next day when she doesn't use it.  Still working to get the best fitting mask.        Genitourinary   Stage 3a chronic kidney disease (CKD) (HCC) - Primary   Chronic, ongoing.  Followed by [facility] nephrology.  Last visit was yesterday.  Microalbumin was normal.  Stable Cr. At 1.5.  Recent note  reviewed.  Labs ordered at visit today.        Stage III lupus nephritis (WHO) (HCC)   Chronic, ongoing.  Followed by [facility] nephrology, continue this collaboration.  Recent note reviewed and medication change.  Check CMP today.        Other   Depression   Chronic.  Controlled.  Continue with current medication regimen.  Labs ordered today.  Return to clinic in 6 months for reevaluation.  Call sooner if concerns arise.       Anxiety   Chronic.  Controlled.  Continue with current medication regimen of PRN Klonopin .  Refilled last month.  Return to clinic in 6 months for reevaluation.  Call sooner if concerns arise.       B12 deficiency anemia   Chronic.  Controlled.  Will supplement if needed.  Labs ordered today.  Return to clinic in 6 months for reevaluation.  Call sooner if concerns arise.        Relevant Orders   CBC w/Diff   B12   Other Visit Diagnoses       Nasal congestion       Will treat with prednisone  at patient's request.     Need for influenza vaccination       Relevant Orders   Flu vaccine HIGH DOSE  PF(Fluzone Trivalent) (Completed)         Follow up plan: Return in about 6 months (around 04/05/2025) for Physical and Fasting labs.

## 2024-10-05 NOTE — Assessment & Plan Note (Signed)
 Chronic, ongoing.  Followed by [facility] nephrology.  Last visit was yesterday.  Microalbumin was normal.  Stable Cr. At 1.5.  Recent note reviewed.  Labs ordered at visit today.

## 2024-10-05 NOTE — Assessment & Plan Note (Signed)
 Chronic.  Controlled.  Continue with current medication regimen.  Labs ordered today.  Return to clinic in 6 months for reevaluation.  Call sooner if concerns arise.  ? ?

## 2024-10-05 NOTE — Assessment & Plan Note (Signed)
 Chronic.  Controlled.  Continue with current medication regimen of Telmisartan and Chlorthalidone .  Medications are prescribed by [facility].  Labs from the nephrology reviewed during visit today.  Return to clinic in 6 months for reevaluation.  Call sooner if concerns arise.

## 2024-10-05 NOTE — Assessment & Plan Note (Signed)
Chronic, ongoing.  Followed by [facility] nephrology, continue this collaboration.  Recent note reviewed and medication change.  Check CMP today.

## 2024-10-06 ENCOUNTER — Ambulatory Visit: Payer: Self-pay | Admitting: Nurse Practitioner

## 2024-10-06 LAB — VITAMIN B12: Vitamin B-12: 1038 pg/mL (ref 232–1245)

## 2024-10-06 LAB — CBC WITH DIFFERENTIAL/PLATELET
Basophils Absolute: 0.1 x10E3/uL (ref 0.0–0.2)
Basos: 1 %
EOS (ABSOLUTE): 0.2 x10E3/uL (ref 0.0–0.4)
Eos: 2 %
Hematocrit: 32 % — ABNORMAL LOW (ref 34.0–46.6)
Hemoglobin: 10.9 g/dL — ABNORMAL LOW (ref 11.1–15.9)
Immature Grans (Abs): 0 x10E3/uL (ref 0.0–0.1)
Immature Granulocytes: 0 %
Lymphocytes Absolute: 2.4 x10E3/uL (ref 0.7–3.1)
Lymphs: 27 %
MCH: 33.4 pg — ABNORMAL HIGH (ref 26.6–33.0)
MCHC: 34.1 g/dL (ref 31.5–35.7)
MCV: 98 fL — ABNORMAL HIGH (ref 79–97)
Monocytes Absolute: 1.2 x10E3/uL — ABNORMAL HIGH (ref 0.1–0.9)
Monocytes: 14 %
Neutrophils Absolute: 4.9 x10E3/uL (ref 1.4–7.0)
Neutrophils: 56 %
Platelets: 477 x10E3/uL — ABNORMAL HIGH (ref 150–450)
RBC: 3.26 x10E6/uL — ABNORMAL LOW (ref 3.77–5.28)
RDW: 12 % (ref 11.7–15.4)
WBC: 8.9 x10E3/uL (ref 3.4–10.8)

## 2024-11-05 ENCOUNTER — Ambulatory Visit

## 2025-04-06 ENCOUNTER — Encounter: Admitting: Nurse Practitioner

## 2025-05-18 ENCOUNTER — Ambulatory Visit
